# Patient Record
Sex: Female | Born: 1955
Health system: Southern US, Community
[De-identification: ages and names within clinical notes are randomized; demographics above are authoritative.]

## PROBLEM LIST (undated history)

## (undated) DIAGNOSIS — M329 Systemic lupus erythematosus, unspecified: Secondary | ICD-10-CM

## (undated) DIAGNOSIS — Z973 Presence of spectacles and contact lenses: Secondary | ICD-10-CM

## (undated) DIAGNOSIS — K259 Gastric ulcer, unspecified as acute or chronic, without hemorrhage or perforation: Secondary | ICD-10-CM

## (undated) DIAGNOSIS — L932 Other local lupus erythematosus: Secondary | ICD-10-CM

## (undated) DIAGNOSIS — K635 Polyp of colon: Secondary | ICD-10-CM

## (undated) DIAGNOSIS — D61810 Antineoplastic chemotherapy induced pancytopenia: Secondary | ICD-10-CM

## (undated) DIAGNOSIS — K649 Unspecified hemorrhoids: Secondary | ICD-10-CM

## (undated) DIAGNOSIS — Z923 Personal history of irradiation: Secondary | ICD-10-CM

## (undated) DIAGNOSIS — M1990 Unspecified osteoarthritis, unspecified site: Secondary | ICD-10-CM

## (undated) DIAGNOSIS — R110 Nausea: Secondary | ICD-10-CM

## (undated) DIAGNOSIS — Z8049 Family history of malignant neoplasm of other genital organs: Secondary | ICD-10-CM

## (undated) DIAGNOSIS — E785 Hyperlipidemia, unspecified: Secondary | ICD-10-CM

## (undated) DIAGNOSIS — D649 Anemia, unspecified: Secondary | ICD-10-CM

## (undated) DIAGNOSIS — Z8711 Personal history of peptic ulcer disease: Secondary | ICD-10-CM

## (undated) DIAGNOSIS — C539 Malignant neoplasm of cervix uteri, unspecified: Secondary | ICD-10-CM

## (undated) DIAGNOSIS — Z803 Family history of malignant neoplasm of breast: Secondary | ICD-10-CM

## (undated) DIAGNOSIS — T451X5A Adverse effect of antineoplastic and immunosuppressive drugs, initial encounter: Secondary | ICD-10-CM

## (undated) DIAGNOSIS — K219 Gastro-esophageal reflux disease without esophagitis: Secondary | ICD-10-CM

## (undated) DIAGNOSIS — F419 Anxiety disorder, unspecified: Secondary | ICD-10-CM

## (undated) HISTORY — DX: Personal history of irradiation: Z92.3

## (undated) HISTORY — DX: Family history of malignant neoplasm of breast: Z80.3

## (undated) HISTORY — PX: ENDOMETRIAL ABLATION: SHX621

## (undated) HISTORY — PX: OTHER SURGICAL HISTORY: SHX169

## (undated) HISTORY — PX: COLONOSCOPY: SHX174

## (undated) HISTORY — PX: COLONOSCOPY WITH ESOPHAGOGASTRODUODENOSCOPY (EGD): SHX5779

## (undated) HISTORY — PX: ESOPHAGOGASTRODUODENOSCOPY: SHX1529

## (undated) HISTORY — DX: Family history of malignant neoplasm of other genital organs: Z80.49

---
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
## 2000-01-10 ENCOUNTER — Encounter: Payer: Self-pay | Admitting: Family Medicine

## 2000-01-10 ENCOUNTER — Encounter: Admission: RE | Admit: 2000-01-10 | Discharge: 2000-01-10 | Payer: Self-pay | Admitting: Family Medicine

## 2008-01-06 ENCOUNTER — Encounter: Admission: RE | Admit: 2008-01-06 | Discharge: 2008-01-06 | Payer: Self-pay | Admitting: Obstetrics

## 2008-06-19 HISTORY — PX: ENDOMETRIAL ABLATION: SHX621

## 2008-06-19 LAB — HM COLONOSCOPY

## 2010-03-23 ENCOUNTER — Encounter: Admission: RE | Admit: 2010-03-23 | Discharge: 2010-03-23 | Payer: Self-pay | Admitting: Obstetrics

## 2011-02-15 ENCOUNTER — Other Ambulatory Visit: Payer: Self-pay | Admitting: Obstetrics

## 2011-02-15 DIAGNOSIS — Z1231 Encounter for screening mammogram for malignant neoplasm of breast: Secondary | ICD-10-CM

## 2011-03-24 ENCOUNTER — Ambulatory Visit
Admission: RE | Admit: 2011-03-24 | Discharge: 2011-03-24 | Disposition: A | Discharge status: Home / Self Care | Payer: Commercial Indemnity | Source: Ambulatory Visit | Attending: Obstetrics | Admitting: Obstetrics

## 2011-03-24 DIAGNOSIS — Z1231 Encounter for screening mammogram for malignant neoplasm of breast: Secondary | ICD-10-CM

## 2012-04-15 ENCOUNTER — Other Ambulatory Visit: Payer: Self-pay | Admitting: Obstetrics

## 2012-04-15 DIAGNOSIS — Z1231 Encounter for screening mammogram for malignant neoplasm of breast: Secondary | ICD-10-CM

## 2012-04-25 ENCOUNTER — Ambulatory Visit
Admission: RE | Admit: 2012-04-25 | Discharge: 2012-04-25 | Disposition: A | Discharge status: Home / Self Care | Payer: BC Managed Care – PPO | Source: Ambulatory Visit | Attending: Obstetrics | Admitting: Obstetrics

## 2012-04-25 DIAGNOSIS — Z1231 Encounter for screening mammogram for malignant neoplasm of breast: Secondary | ICD-10-CM

## 2012-06-19 LAB — HM PAP SMEAR

## 2013-01-31 ENCOUNTER — Other Ambulatory Visit: Payer: Self-pay

## 2013-01-31 DIAGNOSIS — Z1231 Encounter for screening mammogram for malignant neoplasm of breast: Secondary | ICD-10-CM

## 2013-04-28 ENCOUNTER — Ambulatory Visit
Admission: RE | Admit: 2013-04-28 | Discharge: 2013-04-28 | Disposition: A | Discharge status: Home / Self Care | Payer: BC Managed Care – PPO | Source: Ambulatory Visit

## 2013-04-28 DIAGNOSIS — Z1231 Encounter for screening mammogram for malignant neoplasm of breast: Secondary | ICD-10-CM

## 2013-11-06 ENCOUNTER — Ambulatory Visit: Payer: BC Managed Care – PPO | Admitting: Family Medicine

## 2014-06-26 ENCOUNTER — Other Ambulatory Visit: Payer: Self-pay

## 2014-06-26 DIAGNOSIS — Z1231 Encounter for screening mammogram for malignant neoplasm of breast: Secondary | ICD-10-CM

## 2014-07-01 ENCOUNTER — Ambulatory Visit
Admission: RE | Admit: 2014-07-01 | Discharge: 2014-07-01 | Disposition: A | Discharge status: Home / Self Care | Payer: BLUE CROSS/BLUE SHIELD | Source: Ambulatory Visit

## 2014-07-01 DIAGNOSIS — Z1231 Encounter for screening mammogram for malignant neoplasm of breast: Secondary | ICD-10-CM

## 2015-12-15 ENCOUNTER — Other Ambulatory Visit: Payer: Self-pay | Admitting: Obstetrics

## 2015-12-15 DIAGNOSIS — Z1231 Encounter for screening mammogram for malignant neoplasm of breast: Secondary | ICD-10-CM

## 2015-12-23 ENCOUNTER — Ambulatory Visit
Admission: RE | Admit: 2015-12-23 | Discharge: 2015-12-23 | Disposition: A | Discharge status: Home / Self Care | Payer: 59 | Source: Ambulatory Visit | Attending: Obstetrics | Admitting: Obstetrics

## 2015-12-23 DIAGNOSIS — Z1231 Encounter for screening mammogram for malignant neoplasm of breast: Secondary | ICD-10-CM

## 2016-06-21 ENCOUNTER — Ambulatory Visit (INDEPENDENT_AMBULATORY_CARE_PROVIDER_SITE_OTHER): Payer: 59 | Admitting: Family Medicine

## 2016-06-21 ENCOUNTER — Encounter: Payer: Self-pay | Admitting: Family Medicine

## 2016-06-21 VITALS — BP 120/72 | HR 81 | Temp 98.1°F | Resp 17 | Ht 65.0 in | Wt 212.5 lb

## 2016-06-21 DIAGNOSIS — Z1211 Encounter for screening for malignant neoplasm of colon: Secondary | ICD-10-CM

## 2016-06-21 DIAGNOSIS — Z0000 Encounter for general adult medical examination without abnormal findings: Secondary | ICD-10-CM

## 2016-06-21 DIAGNOSIS — Z23 Encounter for immunization: Secondary | ICD-10-CM

## 2016-06-21 NOTE — Progress Notes (Signed)
Pre visit review using our clinic review tool, if applicable. No additional management support is needed unless otherwise documented below in the visit note. 

## 2016-06-21 NOTE — Patient Instructions (Signed)
Follow up in 1 year or as needed We'll notify you of your lab results and make any changes if needed Try and make healthy food choices and get some exercise- you can do it!!! Make sure you are taking at least 20 minutes a day for you!!! Call with any questions or concerns Welcome!  We're glad to have you!!! Happy New Year!!!

## 2016-06-21 NOTE — Progress Notes (Signed)
   Subjective:    Patient ID: Phyllis Hebert, female    DOB: [birth year], 61 y.o.   MRN: [id number]  HPI New to establish.  Previous MD- [facility])  GYN- Pamala Hurry (UTD on pap, [place])  Due for Tdap.  Overdue for colonoscopy- Dr Collene Mares  CPE- pt has no concerns today w/ exception of 'overweight, not exercising, working way too much'.   Review of Systems Patient reports no vision/ hearing changes, adenopathy,fever, weight change,  persistant/recurrent hoarseness , swallowing issues, chest pain, palpitations, edema, persistant/recurrent cough, hemoptysis, dyspnea (rest/exertional/paroxysmal nocturnal), gastrointestinal bleeding (melena, rectal bleeding), abdominal pain, significant heartburn, bowel changes, GU symptoms (dysuria, hematuria, incontinence), Gyn symptoms (abnormal  bleeding, pain),  syncope, focal weakness, memory loss, numbness & tingling, skin/hair/nail changes, abnormal bruising or bleeding, anxiety, or depression.     Objective:   Physical Exam General Appearance:    Alert, cooperative, no distress, appears stated age  Head:    Normocephalic, without obvious abnormality, atraumatic  Eyes:    PERRL, conjunctiva/corneas clear, EOM's intact, fundi    benign, both eyes  Ears:    Normal TM's and external ear canals, both ears  Nose:   Nares normal, septum midline, mucosa normal, no drainage    or sinus tenderness  Throat:   Lips, mucosa, and tongue normal; teeth and gums normal  Neck:   Supple, symmetrical, trachea midline, no adenopathy;    Thyroid: no enlargement/tenderness/nodules  Back:     Symmetric, no curvature, ROM normal, no CVA tenderness  Lungs:     Clear to auscultation bilaterally, respirations unlabored  Chest Wall:    No tenderness or deformity   Heart:    Regular rate and rhythm, S1 and S2 normal, no murmur, rub   or gallop  Breast Exam:    Deferred to GYN  Abdomen:     Soft, non-tender, bowel sounds active all four quadrants,    no masses, no  organomegaly  Genitalia:    Deferred to GYN  Rectal:    Extremities:   Extremities normal, atraumatic, no cyanosis or edema  Pulses:   2+ and symmetric all extremities  Skin:   Skin color, texture, turgor normal, no rashes or lesions  Lymph nodes:   Cervical, supraclavicular, and axillary nodes normal  Neurologic:   CNII-XII intact, normal strength, sensation and reflexes    throughout          Assessment & Plan:  Physical exam- pt's PE WNL w/ exception of obesity.  UTD on GYN, mammo.  Overdue for colonoscopy- referral placed.  Check labs.  Anticipatory guidance provided.

## 2016-06-21 NOTE — Addendum Note (Signed)
Addended by: Davis Gourd on: 06/21/2016 03:33 PM   Modules accepted: Orders

## 2016-06-22 ENCOUNTER — Other Ambulatory Visit: Payer: Self-pay | Admitting: General Practice

## 2016-06-22 LAB — BASIC METABOLIC PANEL
BUN/Creatinine Ratio: 23 (ref 9–23)
BUN: 16 mg/dL (ref 6–24)
CO2: 24 mmol/L (ref 18–29)
Calcium: 9.6 mg/dL (ref 8.7–10.2)
Chloride: 101 mmol/L (ref 96–106)
Creatinine, Ser: 0.69 mg/dL (ref 0.57–1.00)
GFR calc Af Amer: 111 mL/min/{1.73_m2} (ref >59)
GFR calc non Af Amer: 96 mL/min/{1.73_m2} (ref >59)
Glucose: 102 mg/dL — ABNORMAL HIGH (ref 65–99)
Potassium: 4.2 mmol/L (ref 3.5–5.2)
Sodium: 140 mmol/L (ref 134–144)

## 2016-06-22 LAB — CBC WITH DIFFERENTIAL/PLATELET
Basophils Absolute: 0 10*3/uL (ref 0.0–0.2)
Basos: 0 %
EOS (ABSOLUTE): 0.1 10*3/uL (ref 0.0–0.4)
Eos: 1 %
Hematocrit: 40.4 % (ref 34.0–46.6)
Hemoglobin: 13.2 g/dL (ref 11.1–15.9)
Immature Grans (Abs): 0 10*3/uL (ref 0.0–0.1)
Immature Granulocytes: 0 %
Lymphocytes Absolute: 2.6 10*3/uL (ref 0.7–3.1)
Lymphs: 30 %
MCH: 28 pg (ref 26.6–33.0)
MCHC: 32.7 g/dL (ref 31.5–35.7)
MCV: 86 fL (ref 79–97)
Monocytes Absolute: 0.6 10*3/uL (ref 0.1–0.9)
Monocytes: 7 %
Neutrophils Absolute: 5.2 10*3/uL (ref 1.4–7.0)
Neutrophils: 62 %
Platelets: 273 10*3/uL (ref 150–379)
RBC: 4.71 x10E6/uL (ref 3.77–5.28)
RDW: 15.9 % — ABNORMAL HIGH (ref 12.3–15.4)
WBC: 8.5 10*3/uL (ref 3.4–10.8)

## 2016-06-22 LAB — HEPATIC FUNCTION PANEL
ALT: 16 IU/L (ref 0–32)
AST: 17 IU/L (ref 0–40)
Albumin: 4.3 g/dL (ref 3.5–5.5)
Alkaline Phosphatase: 71 IU/L (ref 39–117)
Bilirubin Total: 0.3 mg/dL (ref 0.0–1.2)
Bilirubin, Direct: 0.1 mg/dL (ref 0.00–0.40)
Total Protein: 7.3 g/dL (ref 6.0–8.5)

## 2016-06-22 LAB — LIPID PANEL
Chol/HDL Ratio: 5.6 ratio units — ABNORMAL HIGH (ref 0.0–4.4)
Cholesterol, Total: 230 mg/dL — ABNORMAL HIGH (ref 100–199)
HDL: 41 mg/dL (ref >39)
LDL Calculated: 132 mg/dL — ABNORMAL HIGH (ref 0–99)
Triglycerides: 285 mg/dL — ABNORMAL HIGH (ref 0–149)
VLDL Cholesterol Cal: 57 mg/dL — ABNORMAL HIGH (ref 5–40)

## 2016-06-22 LAB — VITAMIN D 25 HYDROXY (VIT D DEFICIENCY, FRACTURES): Vit D, 25-Hydroxy: 12.6 ng/mL — ABNORMAL LOW (ref 30.0–100.0)

## 2016-06-22 LAB — TSH: TSH: 1.16 u[IU]/mL (ref 0.450–4.500)

## 2016-06-22 MED ORDER — FENOFIBRATE 160 MG PO TABS: 160.0000 mg | ORAL_TABLET | Freq: Every day | ORAL | 6 refills | Status: DC

## 2016-06-22 MED ORDER — VITAMIN D (ERGOCALCIFEROL) 1.25 MG (50000 UNIT) PO CAPS: 50000.0000 [IU] | ORAL_CAPSULE | ORAL | 0 refills | Status: DC

## 2016-10-03 ENCOUNTER — Encounter: Payer: Self-pay | Admitting: Physician Assistant

## 2016-10-03 ENCOUNTER — Ambulatory Visit (INDEPENDENT_AMBULATORY_CARE_PROVIDER_SITE_OTHER): Payer: 59 | Admitting: Physician Assistant

## 2016-10-03 VITALS — BP 124/82 | HR 71 | Temp 97.8°F | Resp 14 | Ht 65.0 in | Wt 199.0 lb

## 2016-10-03 DIAGNOSIS — R21 Rash and other nonspecific skin eruption: Secondary | ICD-10-CM | POA: Diagnosis not present

## 2016-10-03 DIAGNOSIS — R5383 Other fatigue: Secondary | ICD-10-CM

## 2016-10-03 LAB — SEDIMENTATION RATE: Sed Rate: 11 mm/hr (ref 0–30)

## 2016-10-03 MED ORDER — METHYLPREDNISOLONE 4 MG PO TBPK: ORAL_TABLET | ORAL | 0 refills | Status: DC

## 2016-10-03 NOTE — Patient Instructions (Signed)
Please go to the lab for blood work. I will call you with your results.  Please avoid excess sunlight exposure.  Start the steroid pack as directed. Keep skin clean and dry. Let me know if symptoms are not resolving with medication. You can use a Benadryl at night to help with any itch. OTC Sarna lotion to help with itch.

## 2016-10-03 NOTE — Progress Notes (Signed)
   Patient presents to clinic today c/o 2 weeks of a rash of upper and lower extremities, now also noted on anterior upper chest. Is sometimes pruritic. No painful. Denies alleviating or aggravating factors. Denies fever, chills or recent URI symptoms. Denies recent travel or sick contact. Denies change to diet or hygiene products. Denies new pet.  History reviewed. No pertinent past medical history.  Current Outpatient Prescriptions on File Prior to Visit  Medication Sig Dispense Refill  . fenofibrate 160 MG tablet Take 1 tablet (160 mg total) by mouth daily. 30 tablet 6   No current facility-administered medications on file prior to visit.     Allergies  Allergen Reactions  . Penicillins Rash    Family History  Problem Relation Age of Onset  . Hypertension Mother   . Cancer Mother     uterine  . Hypertension Father   . Stroke Father   . Hypertension Brother   . Healthy Daughter   . Stroke Paternal Uncle     Social History   Social History  . Marital status: Married    Spouse name: N/A  . Number of children: N/A  . Years of education: N/A   Social History Main Topics  . Smoking status: Never Smoker  . Smokeless tobacco: Never Used  . Alcohol use No  . Drug use: No  . Sexual activity: Not Asked   Other Topics Concern  . None   Social History Narrative  . None   Review of Systems - See HPI.  All other ROS are negative.  BP 124/82   Pulse 71   Temp 97.8 F (36.6 C) (Oral)   Resp 14   Ht '5\' 5"'$  (1.651 m)   Wt 199 lb (90.3 kg)   SpO2 98%   BMI 33.12 kg/m   Physical Exam  Constitutional: She is oriented to person, place, and time and well-developed, well-nourished, and in no distress.  HENT:  Head: Normocephalic and atraumatic.  Eyes: Conjunctivae are normal.  Neck: Neck supple.  Cardiovascular: Normal rate, regular rhythm, normal heart sounds and intact distal pulses.   Pulmonary/Chest: Effort normal and breath sounds normal. No respiratory distress.  She has no wheezes. She has no rales. She exhibits no tenderness.  Lymphadenopathy:    She has no cervical adenopathy.  Neurological: She is alert and oriented to person, place, and time.  Skin: Skin is warm and dry. Rash noted. Rash is macular.  Macular rash noted of extensor surfaces of distal upper and lower extremities. No evidence of excoriation or abrasion of skin. No evidence of rash of palms or soles.  There is faint similar rash of anterior neck and upper chest, in sun-exposed areas.  Psychiatric: Affect normal.  Vitals reviewed.  Assessment/Plan: 1. Other fatigue Recent Vitamin D deficiency. Will check Vitamin D level today and ESR giving new onset rash. - Vitamin D 1,25 dihydroxy - Sed Rate (ESR)  2. Rash and nonspecific skin eruption Potential photodermatitis as rash is in sun-exposed areas only. No other noted potential triggers. Will start Medrol dose pack. Supportive measures and OTC medications reviewed. ESR ordered. Follow-up if not resolving or if new symptoms develop.  - methylPREDNISolone (MEDROL DOSEPAK) 4 MG TBPK tablet; Take following package directions.  Dispense: 21 tablet; Refill: 0   Leeanne Rio, [place]

## 2016-10-03 NOTE — Progress Notes (Signed)
Pre visit review using our clinic review tool, if applicable. No additional management support is needed unless otherwise documented below in the visit note. 

## 2016-10-06 LAB — VITAMIN D 1,25 DIHYDROXY
Vitamin D 1, 25 (OH)2 Total: 41 pg/mL (ref 18–72)
Vitamin D2 1, 25 (OH)2: 12 pg/mL
Vitamin D3 1, 25 (OH)2: 29 pg/mL

## 2016-10-18 ENCOUNTER — Telehealth: Payer: Self-pay | Admitting: Family Medicine

## 2016-10-18 MED ORDER — PREDNISONE 10 MG PO TABS: ORAL_TABLET | ORAL | 0 refills | Status: DC

## 2016-10-18 NOTE — Telephone Encounter (Signed)
Patient states she is agreeable with starting a longer taper of the steroids. She states she could feel a relief when first started.

## 2016-10-18 NOTE — Telephone Encounter (Signed)
LMOVM for patient to call back of any changes to the rash, better or worst

## 2016-10-18 NOTE — Telephone Encounter (Signed)
Patient states she was seen a few weeks ago for rash.  She was prescribed medication by [facility] and advised the medication should clear the rash up.    The medication did clear up the rash.  However, once she stopped taking the medication, the rash as returned.  She states she was told to call back and let Phyllis Hebert no if the rash had not improved.  Please return call to patient at [phone]), if she is away from her desk please call her on her mobile phone, [phone].

## 2016-10-18 NOTE — Telephone Encounter (Signed)
Call patient to assess current symptoms.  Any new characteristics of rash? Would recommend we set her up urgently with Dermatology for assessment.  Let me know current symptoms so we can determine treatment in the mean time.

## 2016-10-18 NOTE — Telephone Encounter (Signed)
Patient states the rash did not fully resolve 100% on her hand and feet. Now the rash is back at the same places and around her lips and in right ear. The rash is itchy and redness She finished the Medrol dose pak The rash is between the wrist to elbow, fine bumps under the skin. Please advise

## 2016-10-18 NOTE — Telephone Encounter (Signed)
Can restart a longer taper or have her come in for steroid injection.  Let me know what she decides.

## 2016-10-18 NOTE — Telephone Encounter (Signed)
Taper sent

## 2016-11-09 ENCOUNTER — Ambulatory Visit (INDEPENDENT_AMBULATORY_CARE_PROVIDER_SITE_OTHER): Payer: 59 | Admitting: Family Medicine

## 2016-11-09 ENCOUNTER — Encounter: Payer: Self-pay | Admitting: Family Medicine

## 2016-11-09 VITALS — BP 129/71 | HR 73 | Temp 98.1°F | Resp 16 | Ht 65.0 in | Wt 199.0 lb

## 2016-11-09 DIAGNOSIS — R5383 Other fatigue: Secondary | ICD-10-CM | POA: Diagnosis not present

## 2016-11-09 DIAGNOSIS — R21 Rash and other nonspecific skin eruption: Secondary | ICD-10-CM

## 2016-11-09 LAB — CBC WITH DIFFERENTIAL/PLATELET
Basophils Absolute: 0 10*3/uL (ref 0.0–0.1)
Basophils Relative: 0.8 % (ref 0.0–3.0)
Eosinophils Absolute: 0 10*3/uL (ref 0.0–0.7)
Eosinophils Relative: 0.6 % (ref 0.0–5.0)
HCT: 40.2 % (ref 36.0–46.0)
Hemoglobin: 13.3 g/dL (ref 12.0–15.0)
Lymphocytes Relative: 37.4 % (ref 12.0–46.0)
Lymphs Abs: 2.2 10*3/uL (ref 0.7–4.0)
MCHC: 33.1 g/dL (ref 30.0–36.0)
MCV: 86.7 fl (ref 78.0–100.0)
Monocytes Absolute: 0.5 10*3/uL (ref 0.1–1.0)
Monocytes Relative: 9.1 % (ref 3.0–12.0)
Neutro Abs: 3.1 10*3/uL (ref 1.4–7.7)
Neutrophils Relative %: 52.1 % (ref 43.0–77.0)
Platelets: 322 10*3/uL (ref 150.0–400.0)
RBC: 4.64 Mil/uL (ref 3.87–5.11)
RDW: 15.8 % — ABNORMAL HIGH (ref 11.5–15.5)
WBC: 5.9 10*3/uL (ref 4.0–10.5)

## 2016-11-09 LAB — BASIC METABOLIC PANEL
BUN: 19 mg/dL (ref 6–23)
CO2: 30 mEq/L (ref 19–32)
Calcium: 10.1 mg/dL (ref 8.4–10.5)
Chloride: 105 mEq/L (ref 96–112)
Creatinine, Ser: 0.87 mg/dL (ref 0.40–1.20)
GFR: 70.76 mL/min (ref >60.00)
Glucose, Bld: 107 mg/dL — ABNORMAL HIGH (ref 70–99)
Potassium: 4 mEq/L (ref 3.5–5.1)
Sodium: 141 mEq/L (ref 135–145)

## 2016-11-09 LAB — TSH: TSH: 1.31 u[IU]/mL (ref 0.35–4.50)

## 2016-11-09 MED ORDER — TRIAMCINOLONE ACETONIDE 0.1 % EX OINT: 1.0000 "application " | TOPICAL_OINTMENT | Freq: Two times a day (BID) | CUTANEOUS | 1 refills | Status: DC

## 2016-11-09 NOTE — Progress Notes (Signed)
Pre visit review using our clinic review tool, if applicable. No additional management support is needed unless otherwise documented below in the visit note. 

## 2016-11-09 NOTE — Patient Instructions (Signed)
Follow up as needed/scheduled We'll notify you of your lab results and make any changes if needed Start the Triamcinolone ointment twice daily on the rash We'll call you with your Derm appt for evaluation Call with any questions or concerns Hang in there!  We'll figure this out!

## 2016-11-09 NOTE — Progress Notes (Signed)
   Subjective:    Patient ID: Phyllis Hebert, female    DOB: 1956/05/01, 61 y.o.   MRN: [id number]  HPI Fatigue- pt had similar sxs in January.  Was found to be Vit D deficient at that time.  Pt has not had improvement in fatigue despite high dose Vit D replacement.  Pt reports that while she was taking Prednisone, she felt much better in regards to energy level.  When prednisone is finished, she again has excessive fatigue.  Pt reports sleeping well at night.  No loud snoring or breathing pauses to suggest OSA.  Pt reports she will develop a rash on dorsum of hands bilaterally that will extend up her arms.  Will also have sxs on tops of feet, center of chest, and on either side of neck.  Pt reports she has had peeling and cracking of lips since she started having this rash.   Review of Systems For ROS see HPI     Objective:   Physical Exam  Constitutional: She is oriented to person, place, and time. She appears well-developed and well-nourished. No distress.  HENT:  Head: Normocephalic and atraumatic.  Neck: Normal range of motion. Neck supple. No thyromegaly present.  Cardiovascular: Normal rate, regular rhythm and normal heart sounds.   Pulmonary/Chest: Effort normal and breath sounds normal. No respiratory distress. She has no wheezes. She has no rales.  Lymphadenopathy:    She has no cervical adenopathy.  Neurological: She is alert and oriented to person, place, and time.  Skin: Skin is warm and dry. Rash (sandpaper maculopapular rash on dorsum of arms bilaterally, patchy areas on both sides of neck and patchy area on chest) noted. There is erythema (mild over sandpaper rash areas on dorsum of arms and patchy area on chest).  Vitals reviewed.         Assessment & Plan:  Fatigue/Rash- new.  Given pt's description of rash and fatigue, there is a concern for autoimmune process.  Also suspicious is that her sxs dramatically improve w/ Prednisone.  Check ANA.  Refer to derm.   Triamcinolone topically to rash.  If ANA is +, will refer to Rheum.  Pt expressed understanding and is in agreement w/ plan.

## 2016-11-10 ENCOUNTER — Other Ambulatory Visit: Payer: Self-pay | Admitting: Family Medicine

## 2016-11-10 DIAGNOSIS — R768 Other specified abnormal immunological findings in serum: Secondary | ICD-10-CM

## 2016-11-10 LAB — ANTI-NUCLEAR AB-TITER (ANA TITER): ANA Titer 1: 1:160 {titer} — ABNORMAL HIGH

## 2016-11-10 LAB — ANA: Anti Nuclear Antibody(ANA): POSITIVE — AB

## 2017-01-16 ENCOUNTER — Other Ambulatory Visit: Payer: Self-pay | Admitting: Family Medicine

## 2017-01-16 DIAGNOSIS — Z1231 Encounter for screening mammogram for malignant neoplasm of breast: Secondary | ICD-10-CM

## 2017-01-25 ENCOUNTER — Other Ambulatory Visit: Payer: Self-pay | Admitting: Family Medicine

## 2017-01-26 ENCOUNTER — Ambulatory Visit
Admission: RE | Admit: 2017-01-26 | Discharge: 2017-01-26 | Disposition: A | Discharge status: Home / Self Care | Payer: 59 | Source: Ambulatory Visit | Attending: Family Medicine | Admitting: Family Medicine

## 2017-01-26 DIAGNOSIS — Z1231 Encounter for screening mammogram for malignant neoplasm of breast: Secondary | ICD-10-CM

## 2017-06-01 ENCOUNTER — Encounter: Payer: Self-pay | Admitting: Physician Assistant

## 2017-06-01 ENCOUNTER — Other Ambulatory Visit: Payer: Self-pay

## 2017-06-01 ENCOUNTER — Ambulatory Visit (INDEPENDENT_AMBULATORY_CARE_PROVIDER_SITE_OTHER): Payer: BLUE CROSS/BLUE SHIELD | Admitting: Physician Assistant

## 2017-06-01 VITALS — BP 120/80 | HR 72 | Temp 97.7°F | Resp 14 | Ht 65.0 in | Wt 208.0 lb

## 2017-06-01 DIAGNOSIS — J208 Acute bronchitis due to other specified organisms: Secondary | ICD-10-CM | POA: Diagnosis not present

## 2017-06-01 DIAGNOSIS — B9689 Other specified bacterial agents as the cause of diseases classified elsewhere: Secondary | ICD-10-CM

## 2017-06-01 MED ORDER — BENZONATATE 100 MG PO CAPS: 100.0000 mg | ORAL_CAPSULE | Freq: Three times a day (TID) | ORAL | 0 refills | Status: DC | PRN

## 2017-06-01 MED ORDER — AZITHROMYCIN 250 MG PO TABS: ORAL_TABLET | ORAL | 0 refills | Status: DC

## 2017-06-01 NOTE — Progress Notes (Signed)
Patient presents to clinic today c/o 4 weeks of chest congestion and cough that is productive of a green sputum. Has notes nasal congestion but no sinus pain. Notes some R ear pressure. Endorses symptoms seem to wax and wane over the past 3 weeks but have been consistent and worsening over the past week. Denies fever or chills. Has taken Alka Seltzer with temporary relief. Denies recent travel or sick contact.   History reviewed. No pertinent past medical history.  Current Outpatient Medications on File Prior to Visit  Medication Sig Dispense Refill  . Cholecalciferol (VITAMIN D3) 2000 units TABS Take 2 tablets by mouth daily.    . fenofibrate 160 MG tablet TAKE 1 TABLET BY MOUTH DAILY 30 tablet 5  . hydroxychloroquine (PLAQUENIL) 200 MG tablet 2 TABLET WITH FOOD OR MILK ONCE A DAY ORALLY 30  2  . meloxicam (MOBIC) 15 MG tablet Take 15 mg by mouth daily.  3   No current facility-administered medications on file prior to visit.     Allergies  Allergen Reactions  . Penicillins Rash    Family History  Problem Relation Age of Onset  . Hypertension Mother   . Cancer Mother        uterine  . Hypertension Father   . Stroke Father   . Hypertension Brother   . Healthy Daughter   . Stroke Paternal Uncle     Social History   Socioeconomic History  . Marital status: Married    Spouse name: None  . Number of children: None  . Years of education: None  . Highest education level: None  Social Needs  . Financial resource strain: None  . Food insecurity - worry: None  . Food insecurity - inability: None  . Transportation needs - medical: None  . Transportation needs - non-medical: None  Occupational History  . None  Tobacco Use  . Smoking status: Never Smoker  . Smokeless tobacco: Never Used  Substance and Sexual Activity  . Alcohol use: No  . Drug use: No  . Sexual activity: None  Other Topics Concern  . None  Social History Narrative  . None   Review of Systems - See  HPI.  All other ROS are negative.  BP 120/80   Pulse 72   Temp 97.7 F (36.5 C) (Oral)   Resp 14   Ht 5\' 5"  (1.651 m)   Wt 208 lb (94.3 kg)   SpO2 98%   BMI 34.61 kg/m   Physical Exam  Constitutional: She is oriented to person, place, and time and well-developed, well-nourished, and in no distress.  HENT:  Head: Normocephalic and atraumatic.  Right Ear: External ear normal.  Left Ear: External ear normal.  Nose: Nose normal.  Mouth/Throat: Oropharynx is clear and moist. No oropharyngeal exudate.  TM within normal limits bilaterally.  Eyes: Conjunctivae are normal.  Neck: Neck supple.  Cardiovascular: Normal rate, regular rhythm, normal heart sounds and intact distal pulses.  Pulmonary/Chest: Effort normal and breath sounds normal. No respiratory distress. She has no wheezes. She has no rales. She exhibits no tenderness.  Neurological: She is alert and oriented to person, place, and time.  Skin: Skin is warm and dry. No rash noted.  Psychiatric: Affect normal.  Vitals reviewed.  Assessment/Plan: 1. Acute bacterial bronchitis Start ABX. Tessalon per orders. OTC medications and supportive measures reviewed. Follow-up if not improving.   - azithromycin (ZITHROMAX) 250 MG tablet; Take 2 tablets on Day 1. Then take 1 tablet daily.  Dispense: 6 tablet; Refill: 0 - benzonatate (TESSALON) 100 MG capsule; Take 1 capsule (100 mg total) by mouth 3 (three) times daily as needed for cough.  Dispense: 30 capsule; Refill: 0   Leeanne Rio, PA-C

## 2017-06-01 NOTE — Patient Instructions (Signed)
Take antibiotic (Azithromycin) as directed.  Increase fluids.  Get plenty of rest. Use Mucinex for congestion. Start saline rinses. Tsssalon per orders.. Take a daily probiotic (I recommend Align or Culturelle, but even Activia Yogurt may be beneficial).  A humidifier placed in the bedroom may offer some relief for a dry, scratchy throat of nasal irritation.  Read information below on acute bronchitis. Please call or return to clinic if symptoms are not improving.  Acute Bronchitis Bronchitis is when the airways that extend from the windpipe into the lungs get red, puffy, and painful (inflamed). Bronchitis often causes thick spit (mucus) to develop. This leads to a cough. A cough is the most common symptom of bronchitis. In acute bronchitis, the condition usually begins suddenly and goes away over time (usually in 2 weeks). Smoking, allergies, and asthma can make bronchitis worse. Repeated episodes of bronchitis may cause more lung problems.  HOME CARE  Rest.  Drink enough fluids to keep your pee (urine) clear or pale yellow (unless you need to limit fluids as told by your doctor).  Only take over-the-counter or prescription medicines as told by your doctor.  Avoid smoking and secondhand smoke. These can make bronchitis worse. If you are a smoker, think about using nicotine gum or skin patches. Quitting smoking will help your lungs heal faster.  Reduce the chance of getting bronchitis again by:  Washing your hands often.  Avoiding people with cold symptoms.  Trying not to touch your hands to your mouth, nose, or eyes.  Follow up with your doctor as told.  GET HELP IF: Your symptoms do not improve after 1 week of treatment. Symptoms include:  Cough.  Fever.  Coughing up thick spit.  Body aches.  Chest congestion.  Chills.  Shortness of breath.  Sore throat.  GET HELP RIGHT AWAY IF:   You have an increased fever.  You have chills.  You have severe shortness of  breath.  You have bloody thick spit (sputum).  You throw up (vomit) often.  You lose too much body fluid (dehydration).  You have a severe headache.  You faint.  MAKE SURE YOU:   Understand these instructions.  Will watch your condition.  Will get help right away if you are not doing well or get worse. Document Released: 11/22/2007 Document Revised: 02/05/2013 Document Reviewed: 11/26/2012 [facility] Patient Information 2015 [place], [place]. This information is not intended to replace advice given to you by your health care provider. Make sure you discuss any questions you have with your health care provider.

## 2017-07-02 ENCOUNTER — Other Ambulatory Visit: Payer: Self-pay

## 2017-07-02 ENCOUNTER — Encounter: Payer: Self-pay | Admitting: Family Medicine

## 2017-07-02 ENCOUNTER — Ambulatory Visit (INDEPENDENT_AMBULATORY_CARE_PROVIDER_SITE_OTHER): Payer: BLUE CROSS/BLUE SHIELD | Admitting: Family Medicine

## 2017-07-02 VITALS — BP 122/80 | HR 78 | Temp 98.7°F | Resp 16 | Ht 65.0 in | Wt 209.0 lb

## 2017-07-02 DIAGNOSIS — L932 Other local lupus erythematosus: Secondary | ICD-10-CM | POA: Insufficient documentation

## 2017-07-02 DIAGNOSIS — E781 Pure hyperglyceridemia: Secondary | ICD-10-CM | POA: Diagnosis not present

## 2017-07-02 DIAGNOSIS — E559 Vitamin D deficiency, unspecified: Secondary | ICD-10-CM

## 2017-07-02 DIAGNOSIS — Z0000 Encounter for general adult medical examination without abnormal findings: Secondary | ICD-10-CM | POA: Diagnosis not present

## 2017-07-02 NOTE — Assessment & Plan Note (Signed)
Ongoing issue for pt.  Taking Fenofibrate daily.  Stressed need for healthy diet and regular exercise.  Check labs.  Adjust meds prn

## 2017-07-02 NOTE — Assessment & Plan Note (Signed)
Pt's PE WNL w/ exception of obesity.  UTD on mammo, colonoscopy.  Due for pap- pt to schedule.  Check labs.  Anticipatory guidance provided.

## 2017-07-02 NOTE — Patient Instructions (Signed)
Schedule your pap at your convenience We'll notify you of your lab results and make any changes if needed Continue to work on healthy diet and regular exercise- you look great!!! Call with any questions or concerns Happy New Year and Happy Retirement!!!

## 2017-07-02 NOTE — Assessment & Plan Note (Signed)
Following w/ [place] [facility] Marella Chimes).  Will follow along.

## 2017-07-02 NOTE — Progress Notes (Signed)
   Subjective:    Patient ID: Phyllis Hebert, female    DOB: 06-22-1955, 62 y.o.   MRN: [id number]  HPI CPE- UTD on mammo, colonoscopy, Tdap.  Overdue for pap but pt is not interested today- willing to come back.  Declines flu   Review of Systems Patient reports no vision/ hearing changes, adenopathy,fever, weight change,  persistant/recurrent hoarseness , swallowing issues, chest pain, palpitations, edema, persistant/recurrent cough, hemoptysis, dyspnea (rest/exertional/paroxysmal nocturnal), gastrointestinal bleeding (melena, rectal bleeding), abdominal pain, significant heartburn, bowel changes, GU symptoms (dysuria, hematuria, incontinence), Gyn symptoms (abnormal  bleeding, pain),  syncope, focal weakness, memory loss, numbness & tingling, skin/hair/nail changes, abnormal bruising or bleeding, anxiety, or depression.     Objective:   Physical Exam General Appearance:    Alert, cooperative, no distress, appears stated age  Head:    Normocephalic, without obvious abnormality, atraumatic  Eyes:    PERRL, conjunctiva/corneas clear, EOM's intact, fundi    benign, both eyes  Ears:    Normal TM's and external ear canals, both ears  Nose:   Nares normal, septum midline, mucosa normal, no drainage    or sinus tenderness  Throat:   Lips, mucosa, and tongue normal; teeth and gums normal  Neck:   Supple, symmetrical, trachea midline, no adenopathy;    Thyroid: no enlargement/tenderness/nodules  Back:     Symmetric, no curvature, ROM normal, no CVA tenderness  Lungs:     Clear to auscultation bilaterally, respirations unlabored  Chest Wall:    No tenderness or deformity   Heart:    Regular rate and rhythm, S1 and S2 normal, no murmur, rub   or gallop  Breast Exam:    Deferred to mammo  Abdomen:     Soft, non-tender, bowel sounds active all four quadrants,    no masses, no organomegaly  Genitalia:    Deferred at pt's request  Rectal:    Extremities:   Extremities normal, atraumatic, no  cyanosis or edema  Pulses:   2+ and symmetric all extremities  Skin:   Skin color, texture, turgor normal, no rashes or lesions  Lymph nodes:   Cervical, supraclavicular, and axillary nodes normal  Neurologic:   CNII-XII intact, normal strength, sensation and reflexes    throughout          Assessment & Plan:

## 2017-07-02 NOTE — Assessment & Plan Note (Signed)
Pt has hx of this.  Check labs and replete prn. 

## 2017-07-03 LAB — BASIC METABOLIC PANEL
BUN: 25 mg/dL (ref 7–25)
CO2: 29 mmol/L (ref 20–32)
Calcium: 10.4 mg/dL (ref 8.6–10.4)
Chloride: 106 mmol/L (ref 98–110)
Creat: 0.87 mg/dL (ref 0.50–1.05)
Glucose, Bld: 80 mg/dL (ref 65–99)
Potassium: 4.3 mmol/L (ref 3.5–5.3)
Sodium: 140 mmol/L (ref 135–146)

## 2017-07-03 LAB — LIPID PANEL
Cholesterol: 152 mg/dL (ref <200)
HDL: 56 mg/dL (ref >50)
LDL Cholesterol (Calc): 78 mg/dL (calc)
Non-HDL Cholesterol (Calc): 96 mg/dL (calc) (ref <130)
Total CHOL/HDL Ratio: 2.7 (calc) (ref <5.0)
Triglycerides: 97 mg/dL (ref <150)

## 2017-07-03 LAB — CBC WITH DIFFERENTIAL/PLATELET
Basophils Absolute: 48 cells/uL (ref 0–200)
Basophils Relative: 0.7 %
Eosinophils Absolute: 48 cells/uL (ref 15–500)
Eosinophils Relative: 0.7 %
HCT: 38.2 % (ref 35.0–45.0)
Hemoglobin: 12.8 g/dL (ref 11.7–15.5)
Lymphs Abs: 2434 cells/uL (ref 850–3900)
MCH: 28.2 pg (ref 27.0–33.0)
MCHC: 33.5 g/dL (ref 32.0–36.0)
MCV: 84.1 fL (ref 80.0–100.0)
MPV: 10.6 fL (ref 7.5–12.5)
Monocytes Relative: 8.7 %
Neutro Abs: 3679 cells/uL (ref 1500–7800)
Neutrophils Relative %: 54.1 %
Platelets: 267 10*3/uL (ref 140–400)
RBC: 4.54 10*6/uL (ref 3.80–5.10)
RDW: 14.1 % (ref 11.0–15.0)
Total Lymphocyte: 35.8 %
WBC mixed population: 592 cells/uL (ref 200–950)
WBC: 6.8 10*3/uL (ref 3.8–10.8)

## 2017-07-03 LAB — HEPATIC FUNCTION PANEL
AG Ratio: 1.6 (calc) (ref 1.0–2.5)
ALT: 14 U/L (ref 6–29)
AST: 15 U/L (ref 10–35)
Albumin: 4.4 g/dL (ref 3.6–5.1)
Alkaline phosphatase (APISO): 38 U/L (ref 33–130)
Bilirubin, Direct: 0.1 mg/dL (ref 0.0–0.2)
Globulin: 2.7 g/dL (calc) (ref 1.9–3.7)
Indirect Bilirubin: 0.2 mg/dL (calc) (ref 0.2–1.2)
Total Bilirubin: 0.3 mg/dL (ref 0.2–1.2)
Total Protein: 7.1 g/dL (ref 6.1–8.1)

## 2017-07-03 LAB — TSH: TSH: 0.95 mIU/L (ref 0.40–4.50)

## 2017-07-03 LAB — VITAMIN D 25 HYDROXY (VIT D DEFICIENCY, FRACTURES): Vit D, 25-Hydroxy: 39 ng/mL (ref 30–100)

## 2017-07-04 ENCOUNTER — Encounter: Payer: Self-pay | Admitting: General Practice

## 2017-07-17 ENCOUNTER — Other Ambulatory Visit: Payer: Self-pay | Admitting: Family Medicine

## 2017-07-24 ENCOUNTER — Ambulatory Visit (INDEPENDENT_AMBULATORY_CARE_PROVIDER_SITE_OTHER): Payer: PRIVATE HEALTH INSURANCE | Admitting: Family Medicine

## 2017-07-24 ENCOUNTER — Other Ambulatory Visit (HOSPITAL_COMMUNITY)
Admission: RE | Admit: 2017-07-24 | Discharge: 2017-07-24 | Disposition: A | Discharge status: Home / Self Care | Payer: PRIVATE HEALTH INSURANCE | Source: Ambulatory Visit | Attending: Family Medicine | Admitting: Family Medicine

## 2017-07-24 ENCOUNTER — Other Ambulatory Visit: Payer: Self-pay

## 2017-07-24 ENCOUNTER — Encounter: Payer: Self-pay | Admitting: Family Medicine

## 2017-07-24 VITALS — BP 121/81 | HR 66 | Temp 97.9°F | Resp 16 | Ht 65.0 in | Wt 212.0 lb

## 2017-07-24 DIAGNOSIS — Z124 Encounter for screening for malignant neoplasm of cervix: Secondary | ICD-10-CM

## 2017-07-24 NOTE — Patient Instructions (Signed)
Follow up in 1 year (mid January) for your next physical- sooner if needed We'll notify you of your pap results and make any changes if needed Call with any questions or concerns [place]!!!

## 2017-07-24 NOTE — Progress Notes (Signed)
   Subjective:    Patient ID: Phyllis Hebert, female    DOB: 12/06/1955, 62 y.o.   MRN: [id number]  HPI Pap- pt has no concerns today.  No vaginal d/c, no pain.  No hx of abnormal paps.  UTD on mammogram will do breast exam today.   Review of Systems For ROS see HPI     Objective:   Physical Exam  Constitutional: She appears well-developed and well-nourished. No distress.  Pulmonary/Chest: Right breast exhibits no inverted nipple, no mass, no nipple discharge, no skin change and no tenderness. Left breast exhibits no inverted nipple, no mass, no nipple discharge, no skin change and no tenderness.  Genitourinary: Rectal exam shows no external hemorrhoid and anal tone normal. No breast swelling, tenderness, discharge or bleeding. There is no rash, tenderness, lesion or injury on the right labia. There is no rash, tenderness, lesion or injury on the left labia. Uterus is not deviated, not enlarged, not fixed and not tender. Cervix exhibits no motion tenderness, no discharge and no friability (large, external transformation zone). Right adnexum displays no mass, no tenderness and no fullness. Left adnexum displays no mass, no tenderness and no fullness. No erythema, tenderness or bleeding in the vagina. No foreign body in the vagina. No vaginal discharge found.  Vitals reviewed.         Assessment & Plan:  Pap- pt w/o vaginal concerns and no hx of abnormal pap.  She does have large external transformation zone.  Pap collected.  HPV cotesting done.

## 2017-07-26 ENCOUNTER — Encounter: Payer: Self-pay | Admitting: General Practice

## 2017-07-26 LAB — CYTOLOGY - PAP
Diagnosis: NEGATIVE
HPV: NOT DETECTED

## 2018-01-02 ENCOUNTER — Other Ambulatory Visit: Payer: Self-pay | Admitting: Family Medicine

## 2018-01-02 DIAGNOSIS — Z1231 Encounter for screening mammogram for malignant neoplasm of breast: Secondary | ICD-10-CM

## 2018-02-11 ENCOUNTER — Ambulatory Visit
Admission: RE | Admit: 2018-02-11 | Discharge: 2018-02-11 | Disposition: A | Discharge status: Home / Self Care | Payer: PRIVATE HEALTH INSURANCE | Source: Ambulatory Visit | Attending: Family Medicine | Admitting: Family Medicine

## 2018-02-11 DIAGNOSIS — Z1231 Encounter for screening mammogram for malignant neoplasm of breast: Secondary | ICD-10-CM

## 2018-04-19 ENCOUNTER — Other Ambulatory Visit: Payer: Self-pay | Admitting: Family Medicine

## 2018-08-07 ENCOUNTER — Encounter: Payer: Self-pay | Admitting: Family Medicine

## 2018-08-07 ENCOUNTER — Ambulatory Visit (INDEPENDENT_AMBULATORY_CARE_PROVIDER_SITE_OTHER): Payer: PRIVATE HEALTH INSURANCE | Admitting: Family Medicine

## 2018-08-07 ENCOUNTER — Other Ambulatory Visit: Payer: Self-pay

## 2018-08-07 VITALS — BP 120/81 | HR 78 | Temp 98.0°F | Resp 16 | Ht 65.0 in | Wt 204.5 lb

## 2018-08-07 DIAGNOSIS — Z0000 Encounter for general adult medical examination without abnormal findings: Secondary | ICD-10-CM

## 2018-08-07 DIAGNOSIS — E669 Obesity, unspecified: Secondary | ICD-10-CM | POA: Diagnosis not present

## 2018-08-07 DIAGNOSIS — E559 Vitamin D deficiency, unspecified: Secondary | ICD-10-CM | POA: Diagnosis not present

## 2018-08-07 LAB — BASIC METABOLIC PANEL
BUN: 24 mg/dL — ABNORMAL HIGH (ref 6–23)
CO2: 29 mEq/L (ref 19–32)
Calcium: 10.4 mg/dL (ref 8.4–10.5)
Chloride: 103 mEq/L (ref 96–112)
Creatinine, Ser: 0.85 mg/dL (ref 0.40–1.20)
GFR: 67.99 mL/min (ref >60.00)
Glucose, Bld: 82 mg/dL (ref 70–99)
Potassium: 3.8 mEq/L (ref 3.5–5.1)
Sodium: 140 mEq/L (ref 135–145)

## 2018-08-07 LAB — LIPID PANEL
Cholesterol: 151 mg/dL (ref 0–200)
HDL: 59.6 mg/dL (ref >39.00)
LDL Cholesterol: 72 mg/dL (ref 0–99)
NonHDL: 91.28
Total CHOL/HDL Ratio: 3
Triglycerides: 96 mg/dL (ref 0.0–149.0)
VLDL: 19.2 mg/dL (ref 0.0–40.0)

## 2018-08-07 LAB — HEPATIC FUNCTION PANEL
ALT: 15 U/L (ref 0–35)
AST: 15 U/L (ref 0–37)
Albumin: 4.7 g/dL (ref 3.5–5.2)
Alkaline Phosphatase: 41 U/L (ref 39–117)
Bilirubin, Direct: 0.1 mg/dL (ref 0.0–0.3)
Total Bilirubin: 0.4 mg/dL (ref 0.2–1.2)
Total Protein: 7.4 g/dL (ref 6.0–8.3)

## 2018-08-07 LAB — TSH: TSH: 0.92 u[IU]/mL (ref 0.35–4.50)

## 2018-08-07 LAB — CBC WITH DIFFERENTIAL/PLATELET
Basophils Absolute: 0 10*3/uL (ref 0.0–0.1)
Basophils Relative: 0.3 % (ref 0.0–3.0)
Eosinophils Absolute: 0.1 10*3/uL (ref 0.0–0.7)
Eosinophils Relative: 1 % (ref 0.0–5.0)
HCT: 41.3 % (ref 36.0–46.0)
Hemoglobin: 13.8 g/dL (ref 12.0–15.0)
Lymphocytes Relative: 31.5 % (ref 12.0–46.0)
Lymphs Abs: 2.1 10*3/uL (ref 0.7–4.0)
MCHC: 33.3 g/dL (ref 30.0–36.0)
MCV: 86.6 fl (ref 78.0–100.0)
Monocytes Absolute: 0.5 10*3/uL (ref 0.1–1.0)
Monocytes Relative: 7.1 % (ref 3.0–12.0)
Neutro Abs: 4 10*3/uL (ref 1.4–7.7)
Neutrophils Relative %: 60.1 % (ref 43.0–77.0)
Platelets: 268 10*3/uL (ref 150.0–400.0)
RBC: 4.77 Mil/uL (ref 3.87–5.11)
RDW: 15.2 % (ref 11.5–15.5)
WBC: 6.7 10*3/uL (ref 4.0–10.5)

## 2018-08-07 LAB — VITAMIN D 25 HYDROXY (VIT D DEFICIENCY, FRACTURES): VITD: 39.21 ng/mL (ref 30.00–100.00)

## 2018-08-07 NOTE — Assessment & Plan Note (Signed)
Pt is down 8 lbs since last visit.  Applauded her efforts.  Check labs to risk stratify.  Will follow.

## 2018-08-07 NOTE — Patient Instructions (Signed)
Follow up in 6 months to recheck weight loss progress and cholesterol We'll notify you of your lab results and make any changes if needed Continue to work on healthy diet and regular exercise- you're doing great! Call with any questions or concerns Happy Bel[employer] Birthday!!!

## 2018-08-07 NOTE — Assessment & Plan Note (Signed)
Pt has hx of this.  Check labs and replete prn. 

## 2018-08-07 NOTE — Progress Notes (Signed)
   Subjective:    Patient ID: Phyllis Hebert, female    DOB: Jan 31, 1956, 63 y.o.   MRN: [id number]  HPI CPE- UTD on pap, mammo, immunizations.  Due for repeat colonoscopy this year- pt got reminder letter and plans to schedule.  Down 8 lbs- now walking 6x/week, 30-45 minutes/day.     Review of Systems Patient reports no vision/ hearing changes, adenopathy,fever, weight change,  persistant/recurrent hoarseness , swallowing issues, chest pain, palpitations, edema, persistant/recurrent cough, hemoptysis, dyspnea (rest/exertional/paroxysmal nocturnal), gastrointestinal bleeding (melena, rectal bleeding), abdominal pain, significant heartburn, bowel changes, GU symptoms (dysuria, hematuria, incontinence), Gyn symptoms (abnormal  bleeding, pain),  syncope, focal weakness, memory loss, numbness & tingling, skin/hair/nail changes, abnormal bruising or bleeding, anxiety, or depression.     Objective:   Physical Exam General Appearance:    Alert, cooperative, no distress, appears stated age  Head:    Normocephalic, without obvious abnormality, atraumatic  Eyes:    PERRL, conjunctiva/corneas clear, EOM's intact, fundi    benign, both eyes  Ears:    Normal TM's and external ear canals, both ears  Nose:   Nares normal, septum midline, mucosa normal, no drainage    or sinus tenderness  Throat:   Lips, mucosa, and tongue normal; teeth and gums normal  Neck:   Supple, symmetrical, trachea midline, no adenopathy;    Thyroid: no enlargement/tenderness/nodules  Back:     Symmetric, no curvature, ROM normal, no CVA tenderness  Lungs:     Clear to auscultation bilaterally, respirations unlabored  Chest Wall:    No tenderness or deformity   Heart:    Regular rate and rhythm, S1 and S2 normal, no murmur, rub   or gallop  Breast Exam:    Deferred to GYN  Abdomen:     Soft, non-tender, bowel sounds active all four quadrants,    no masses, no organomegaly  Genitalia:    Deferred to GYN  Rectal:      Extremities:   Extremities normal, atraumatic, no cyanosis or edema  Pulses:   2+ and symmetric all extremities  Skin:   Skin color, texture, turgor normal, no rashes or lesions  Lymph nodes:   Cervical, supraclavicular, and axillary nodes normal  Neurologic:   CNII-XII intact, normal strength, sensation and reflexes    throughout          Assessment & Plan:

## 2018-08-08 ENCOUNTER — Encounter: Payer: Self-pay | Admitting: General Practice

## 2018-11-03 ENCOUNTER — Other Ambulatory Visit: Payer: Self-pay | Admitting: Family Medicine

## 2019-01-17 ENCOUNTER — Other Ambulatory Visit: Payer: Self-pay | Admitting: Family Medicine

## 2019-01-17 DIAGNOSIS — Z1231 Encounter for screening mammogram for malignant neoplasm of breast: Secondary | ICD-10-CM

## 2019-02-03 ENCOUNTER — Ambulatory Visit: Payer: PRIVATE HEALTH INSURANCE | Admitting: Family Medicine

## 2019-02-18 ENCOUNTER — Ambulatory Visit: Payer: PRIVATE HEALTH INSURANCE | Admitting: Family Medicine

## 2019-03-05 ENCOUNTER — Other Ambulatory Visit: Payer: Self-pay

## 2019-03-05 ENCOUNTER — Ambulatory Visit
Admission: RE | Admit: 2019-03-05 | Discharge: 2019-03-05 | Disposition: A | Discharge status: Home / Self Care | Payer: PRIVATE HEALTH INSURANCE | Source: Ambulatory Visit | Attending: Family Medicine | Admitting: Family Medicine

## 2019-03-05 DIAGNOSIS — Z1231 Encounter for screening mammogram for malignant neoplasm of breast: Secondary | ICD-10-CM

## 2019-05-05 ENCOUNTER — Other Ambulatory Visit: Payer: Self-pay | Admitting: Family Medicine

## 2019-06-20 DIAGNOSIS — Z8616 Personal history of COVID-19: Secondary | ICD-10-CM

## 2019-06-20 HISTORY — DX: Personal history of COVID-19: Z86.16

## 2019-06-26 ENCOUNTER — Other Ambulatory Visit: Payer: Self-pay

## 2019-06-26 ENCOUNTER — Ambulatory Visit (INDEPENDENT_AMBULATORY_CARE_PROVIDER_SITE_OTHER): Payer: PRIVATE HEALTH INSURANCE | Admitting: Family Medicine

## 2019-06-26 ENCOUNTER — Encounter: Payer: Self-pay | Admitting: Family Medicine

## 2019-06-26 ENCOUNTER — Encounter (INDEPENDENT_AMBULATORY_CARE_PROVIDER_SITE_OTHER): Payer: Self-pay

## 2019-06-26 DIAGNOSIS — U071 COVID-19: Secondary | ICD-10-CM | POA: Diagnosis not present

## 2019-06-26 NOTE — Progress Notes (Signed)
I have discussed the procedure for the virtual visit with the patient who has given consent to proceed with assessment and treatment.   Pt unable to obtain vitals.   Inocencio Roy L Delara Shepheard, CMA     

## 2019-06-26 NOTE — Progress Notes (Signed)
   Virtual Visit via Video   I connected with patient on 06/26/19 at  8:00 AM EST by a video enabled telemedicine application and verified that I am speaking with the correct person using two identifiers.  Location patient: Home Location provider: [facility]up[facility], Office Persons participating in the virtual visit: Patient, Provider, [place] (Jess B)  I discussed the limitations of evaluation and management by telemedicine and the availability of in person appointments. The patient expressed understanding and agreed to proceed.  Subjective:   HPI:   COVID- pt was dx'd on 1/1 at [facility].  Husband also sick.  Pt had mild sxs that prompted testing.  Has felt worse since testing.  Now having daily fevers to 101 in the afternoon that will last 4-5 hrs.  Denies chest congestion.  + cough.  Sore throat started yesterday.  Denies SOB.  Drinking a lot of fluids.  + anorexia.  Taking Vit D, C, and Zinc.  Taking tylenol and ibuprofen alternating.    ROS:   See pertinent positives and negatives per HPI.  Patient Active Problem List   Diagnosis Date Noted  . Obesity (BMI 30-39.9) 08/07/2018  . Hypertriglyceridemia 07/02/2017  . Vitamin D deficiency 07/02/2017  . Physical exam 07/02/2017  . 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 Smoking status: Never Smoker  . Smokeless tobacco: Never Used  Substance Use Topics  . Alcohol use: No    Current Outpatient Medications:  .  Cholecalciferol (VITAMIN D3) 2000 units TABS, Take 2 tablets by mouth daily., Disp: , Rfl:  .  fenofibrate 160 MG tablet, TAKE 1 TABLET BY MOUTH EVERY DAY, Disp: 30 tablet, Rfl: 5 .  hydroxychloroquine (PLAQUENIL) 200 MG tablet, 2 TABLET WITH FOOD OR MILK ONCE A DAY ORALLY 30, Disp: , Rfl: 2 .  meloxicam (MOBIC) 15 MG tablet, Take 15 mg by mouth daily., Disp: , Rfl: 3  Allergies  Allergen Reactions  . Penicillins Rash    Objective:   SpO2 93%  AAOx3, NAD NCAT, EOMI No obvious  CN deficits Coloring WNL Pt is able to speak clearly, coherently without shortness of breath or increased work of breathing.  Thought process is linear.  Mood is appropriate.   Assessment and Plan:   COVID- pt was tested and confirmed on 1/1.  Since then has had daily fever.  Denies SOB.  Able to drink plenty of fluids.  Taking Vit D, C, and Zinc.  Reviewed supportive care and red flags that should prompt return.  Pt expressed understanding and is in agreement w/ plan.    Annye Asa, MD 06/26/2019

## 2019-06-27 ENCOUNTER — Encounter (INDEPENDENT_AMBULATORY_CARE_PROVIDER_SITE_OTHER): Payer: Self-pay

## 2019-06-28 ENCOUNTER — Encounter (INDEPENDENT_AMBULATORY_CARE_PROVIDER_SITE_OTHER): Payer: Self-pay

## 2019-06-29 ENCOUNTER — Telehealth: Payer: Self-pay

## 2019-06-29 ENCOUNTER — Encounter (INDEPENDENT_AMBULATORY_CARE_PROVIDER_SITE_OTHER): Payer: Self-pay

## 2019-06-29 NOTE — Telephone Encounter (Signed)
Called pt and pt stated that she did not sleep well last night and feels more fatigue. Pt advised if she has worsening weakness with inability to stand or if she has to hold onto something to keep balance advised pt to call 911 and seek tx in the ED.

## 2019-07-01 ENCOUNTER — Encounter (INDEPENDENT_AMBULATORY_CARE_PROVIDER_SITE_OTHER): Payer: Self-pay

## 2019-07-03 ENCOUNTER — Encounter (INDEPENDENT_AMBULATORY_CARE_PROVIDER_SITE_OTHER): Payer: Self-pay

## 2019-07-04 ENCOUNTER — Encounter (INDEPENDENT_AMBULATORY_CARE_PROVIDER_SITE_OTHER): Payer: Self-pay

## 2019-07-17 ENCOUNTER — Encounter: Payer: Self-pay | Admitting: *Deleted

## 2020-02-04 ENCOUNTER — Other Ambulatory Visit: Payer: Self-pay | Admitting: Family Medicine

## 2020-02-04 DIAGNOSIS — Z1231 Encounter for screening mammogram for malignant neoplasm of breast: Secondary | ICD-10-CM

## 2020-02-25 ENCOUNTER — Other Ambulatory Visit: Payer: Self-pay | Admitting: Family Medicine

## 2020-03-23 ENCOUNTER — Other Ambulatory Visit: Payer: Self-pay

## 2020-03-23 ENCOUNTER — Ambulatory Visit
Admission: RE | Admit: 2020-03-23 | Discharge: 2020-03-23 | Disposition: A | Discharge status: Home / Self Care | Payer: Managed Care, Other (non HMO) | Source: Ambulatory Visit | Attending: Family Medicine | Admitting: Family Medicine

## 2020-03-23 DIAGNOSIS — Z1231 Encounter for screening mammogram for malignant neoplasm of breast: Secondary | ICD-10-CM

## 2020-04-01 ENCOUNTER — Inpatient Hospital Stay: Payer: Managed Care, Other (non HMO)

## 2020-04-01 ENCOUNTER — Encounter: Payer: Self-pay | Admitting: Gynecologic Oncology

## 2020-04-01 ENCOUNTER — Other Ambulatory Visit: Payer: Self-pay

## 2020-04-01 ENCOUNTER — Inpatient Hospital Stay: Payer: Managed Care, Other (non HMO) | Attending: Gynecologic Oncology | Admitting: Gynecologic Oncology

## 2020-04-01 ENCOUNTER — Encounter: Payer: Self-pay | Admitting: Oncology

## 2020-04-01 VITALS — BP 147/61 | HR 73 | Temp 98.3°F | Resp 18 | Ht 64.5 in | Wt 212.0 lb

## 2020-04-01 DIAGNOSIS — C530 Malignant neoplasm of endocervix: Secondary | ICD-10-CM | POA: Diagnosis present

## 2020-04-01 DIAGNOSIS — Z79899 Other long term (current) drug therapy: Secondary | ICD-10-CM | POA: Diagnosis not present

## 2020-04-01 DIAGNOSIS — Z791 Long term (current) use of non-steroidal anti-inflammatories (NSAID): Secondary | ICD-10-CM | POA: Diagnosis not present

## 2020-04-01 DIAGNOSIS — M329 Systemic lupus erythematosus, unspecified: Secondary | ICD-10-CM | POA: Insufficient documentation

## 2020-04-01 DIAGNOSIS — C539 Malignant neoplasm of cervix uteri, unspecified: Secondary | ICD-10-CM

## 2020-04-01 DIAGNOSIS — E669 Obesity, unspecified: Secondary | ICD-10-CM | POA: Insufficient documentation

## 2020-04-01 DIAGNOSIS — C169 Malignant neoplasm of stomach, unspecified: Secondary | ICD-10-CM | POA: Diagnosis not present

## 2020-04-01 DIAGNOSIS — Z8542 Personal history of malignant neoplasm of other parts of uterus: Secondary | ICD-10-CM | POA: Diagnosis not present

## 2020-04-01 LAB — BASIC METABOLIC PANEL
Anion gap: 4 — ABNORMAL LOW (ref 5–15)
BUN: 24 mg/dL — ABNORMAL HIGH (ref 8–23)
CO2: 29 mmol/L (ref 22–32)
Calcium: 10 mg/dL (ref 8.9–10.3)
Chloride: 106 mmol/L (ref 98–111)
Creatinine, Ser: 1.34 mg/dL — ABNORMAL HIGH (ref 0.44–1.00)
GFR, Estimated: 42 mL/min — ABNORMAL LOW (ref >60)
Glucose, Bld: 90 mg/dL (ref 70–99)
Potassium: 4.5 mmol/L (ref 3.5–5.1)
Sodium: 139 mmol/L (ref 135–145)

## 2020-04-01 LAB — CBC WITH DIFFERENTIAL (CANCER CENTER ONLY)
Abs Immature Granulocytes: 0.02 10*3/uL (ref 0.00–0.07)
Basophils Absolute: 0 10*3/uL (ref 0.0–0.1)
Basophils Relative: 0 %
Eosinophils Absolute: 0.1 10*3/uL (ref 0.0–0.5)
Eosinophils Relative: 1 %
HCT: 35 % — ABNORMAL LOW (ref 36.0–46.0)
Hemoglobin: 11.6 g/dL — ABNORMAL LOW (ref 12.0–15.0)
Immature Granulocytes: 0 %
Lymphocytes Relative: 22 %
Lymphs Abs: 2 10*3/uL (ref 0.7–4.0)
MCH: 28.6 pg (ref 26.0–34.0)
MCHC: 33.1 g/dL (ref 30.0–36.0)
MCV: 86.2 fL (ref 80.0–100.0)
Monocytes Absolute: 0.5 10*3/uL (ref 0.1–1.0)
Monocytes Relative: 6 %
Neutro Abs: 6.1 10*3/uL (ref 1.7–7.7)
Neutrophils Relative %: 71 %
Platelet Count: 282 10*3/uL (ref 150–400)
RBC: 4.06 MIL/uL (ref 3.87–5.11)
RDW: 13.6 % (ref 11.5–15.5)
WBC Count: 8.8 10*3/uL (ref 4.0–10.5)
nRBC: 0 % (ref 0.0–0.2)

## 2020-04-01 LAB — CEA (IN HOUSE-CHCC): CEA (CHCC-In House): 29.04 ng/mL — ABNORMAL HIGH (ref 0.00–5.00)

## 2020-04-01 NOTE — Progress Notes (Signed)
Consult Note: Gyn-Onc  Consult was requested by Phyllis Hebert for the evaluation of Phyllis Hebert 64 y.o. female  CC:  Chief Complaint  Patient presents with  . Cervical Cancer    Assessment/Plan:  Phyllis Hebert  is a 64 y.o.  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  I am recommending further diagnostic work-up with a PET scan and evaluation from gastroenterology for consideration of EGD.  If no primary upper gastrointestinal lesion is identified, will make presumptive diagnosis that this is a primary gastric type endocervical adenocarcinoma, stage IIb.  I discussed with the patient and her husband that the recommended treatment for cervical cancer that is locally advanced as definitive chemoradiation.  I described the potential course of treatment including duration and likelihood of cure.  This would be curative intent therapy unless imaging suggests stage IV disease.  I am recommending external beam and brachytherapy as well as weekly cisplatin chemotherapy and she will see medical oncology and radiation oncology to discuss this further.  She is planning on a vacation between October 23 and October 30, therefore we will attempt to get work-up done prior to that time with plan for initiation of therapy after she returns.  She understands if she were to develop heavy bleeding while on vacation, she will need to return to be considered for early initiation of radiation.   HPI: Ms Phyllis Hebert is a 64 year old P2 who was seen in consultation at the request of Dr Murrell Hebert for evaluation of adenocarcinoma of the cervix.  The patient had history of new onset vaginal spotting on March 16, 2020.  She immediately called her OB/GYN's office and achieved an appointment with her OB/GYN's partner, Phyllis Hebert, who saw and evaluated the patient on March 19, 2020.   At that time a transvaginal ultrasound scan was performed which revealed a uterus measuring 5.3 x 8.4 x 4.6 cm with an ill-defined endometrium.  There is a complex cyst on the left ovary measuring 2.6 cm with peripheral blood flow noted.  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  Immunostains were positive for CEA and CDX2, negative for p16, ER, vimentin, and PAX8.  The differential diagnosis included primary gastric type endocervical adenocarcinoma (though this typically stains positive for PAX8) or a metastasis from pancreaticobiliary or upper GI primary.  Her medical history is most significant for obesity and lupus for which she takes Plaquenil.  Her surgical history is most significant for a NovaSure ablation in 2010 for abnormal uterine bleeding.  Her gynecologic history is remarkable for abnormal uterine bleeding at age 21 treated with NovaSure ablation.  She is had 2 prior vaginal deliveries.  Her family cancer history is significant for her mother reporting a history of uterine cancer treated with surgery at age 67s.  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 She lives with her husband.  They are not sexually active.   Current Meds:  Outpatient Encounter Medications as of 04/01/2020  Medication Sig  . Cholecalciferol (VITAMIN D3) 2000 units TABS Take 2 tablets by mouth daily.  . clobetasol cream (TEMOVATE) 0.05 % Apply topically. Left leg  . fenofibrate 160 MG tablet TAKE 1 TABLET BY MOUTH EVERY DAY  . hydroxychloroquine (PLAQUENIL) 200 MG tablet 2 TABLET WITH FOOD OR  MILK ONCE A DAY ORALLY 30  . meloxicam (MOBIC) 15 MG tablet Take 15 mg by mouth daily.   No facility-administered encounter medications on file as of 04/01/2020.    Allergy:  Allergies  Allergen Reactions  . Penicillins Rash    Social Hx:    Social History   Socioeconomic History  . Marital status: Married    Spouse name: Not on file  . Number of children: Not on file  . Years of education: Not on file  . Highest education level: Not on file  Occupational History  . Not on file  Tobacco Use  . Smoking status: Never Smoker  . Smokeless tobacco: Never Used  Substance and Sexual Activity  . Alcohol use: No  . Drug use: No  . Sexual activity: Not on file  Other Topics Concern  . Not on file  Social History Narrative  . Not on file   Social Determinants of Health   Financial Resource Strain:   . Difficulty of Paying Living Expenses: Not on file  Food Insecurity:   . Worried About Charity fundraiser in the Last Year: Not on file  . Ran Out of Food in the Last Year: Not on file  Transportation Needs:   . Lack of Transportation (Medical): Not on file  . Lack of Transportation (Non-Medical): Not on file  Physical Activity:   . Days of Exercise per Week: Not on file  . Minutes of Exercise per Session: Not on file  Stress:   . Feeling of Stress : Not on file  Social Connections:   . Frequency of Communication with Friends and Family: Not on file  . Frequency of Social Gatherings with Friends and Family: Not on file  . Attends Religious Services: Not on file  . Active Member of Clubs or Organizations: Not on file  . Attends Archivist Meetings: Not on file  . Marital Status: Not on file  Intimate Partner Violence:   . Fear of Current or Ex-Partner: Not on file  . Emotionally Abused: Not on file  . Physically Abused: Not on file  . Sexually Abused: Not on file    Past Surgical Hx:  Past Surgical History:  Procedure Laterality Date  . ENDOMETRIAL ABLATION      Past Medical Hx:  Past Medical History:  Diagnosis Date  . Lupus [facility])     Past Gynecological History: See HPI No LMP recorded. Patient is postmenopausal.  Family Hx:  Family History  Problem Relation Age of Onset  . Hypertension  Mother   . Cancer Mother        uterine  . Emphysema Mother   . Hypertension Father   . Stroke Father   . Hypertension Brother   . Healthy Daughter   . Stroke Paternal Uncle     Review of Systems:  Constitutional  Feels well,    ENT Normal appearing ears and nares bilaterally Skin/Breast  No rash, sores, jaundice, itching, dryness Cardiovascular  No chest pain, shortness of breath, or edema  Pulmonary  No cough or wheeze.  Gastro Intestinal  No nausea, vomitting, or diarrhoea. No bright red blood per rectum, no abdominal pain, change in bowel movement,+ constipation.  Genito Urinary  No frequency, urgency, dysuria, positive for postmenopausal bleeding and uterine cramping Musculo Skeletal  No myalgia, arthralgia, joint swelling or pain  Neurologic  No weakness, numbness, change in gait,  Psychology  No depression, anxiety, insomnia.   Vitals:  Blood pressure Marland Kitchen)  147/61, pulse 73, temperature 98.3 F (36.8 C), temperature source Tympanic, resp. rate 18, height 5' 4.5" (1.638 m), weight 212 lb (96.2 kg), SpO2 100 %.  Physical Exam: WD in NAD Neck  Supple NROM, without any enlargements.  Lymph Node Survey No cervical supraclavicular or inguinal adenopathy Cardiovascular  Pulse normal rate, regularity and rhythm. S1 and S2 normal.  Lungs  Clear to auscultation bilateraly, without wheezes/crackles/rhonchi. Good air movement.  Skin  No rash/lesions/breakdown  Psychiatry  Alert and oriented to person, place, and time  Abdomen  Normoactive bowel sounds, abdomen soft, non-tender and obese without evidence of hernia.  Back No CVA tenderness Genito Urinary  Vulva/vagina: Normal external female genitalia.  No lesions. No discharge or bleeding.  Bladder/urethra:  No lesions or masses, well supported bladder  Vagina: The upper vagina posteriorly and anteriorly encroaches upon the cervical tumor mass and is likely involved at this most upper level  Cervix: Replaced by a 4  to 5 cm mass which appears most significant on the anterior lip of the cervix.  It is rock hard and fixed.  It is friable.  The parametria bilaterally feel infiltrated with tumor (right greater than left) but without sidewall extension.  Uterus:  Small, not mobile, + parametrial involvement  Adnexa: No discretely palpable masses. Rectal  Good tone, + parametrial infiltration but not to the sidewalls Extremities  No bilateral cyanosis, clubbing or edema.  60 minutes of total time was spent for this patient encounter, including preparation, face-to-face counseling with the patient and coordination of care, review of imaging (results and images), communication with the referring provider and documentation of the encounter.   Thereasa Solo, MD  04/01/2020, 9:29 AM

## 2020-04-01 NOTE — Progress Notes (Signed)
Met with Phyllis Hebert and her husband after their visit with Dr. Denman George.  Went over upcoming appointments and treatment plan and provided her with the [employer]   Called [facility] with urgent referral and an appointment was scheduled on 04/05/20 to discuss urgent EGD.    Called [place] and advised her of appointment with Dr. Tarri Glenn at [facility] on 04/05/20 and also scheduled new patient appointment with Dr. Alvy Bimler on 04/19/20 at 2:00 with 1:30 arrival.  She verbalized understanding and agreement of all appointments.

## 2020-04-01 NOTE — Patient Instructions (Signed)
Dr. Denman George has scheduled a PET scan and also would like you to see Dr. Alvy Bimler with Medical Oncology and Dr. Sondra Come with Radiation Oncology.  In addition, she would like you to have an EGD.

## 2020-04-05 ENCOUNTER — Encounter: Payer: Self-pay | Admitting: Gastroenterology

## 2020-04-05 ENCOUNTER — Telehealth: Payer: Self-pay | Admitting: Gastroenterology

## 2020-04-05 ENCOUNTER — Ambulatory Visit: Payer: Managed Care, Other (non HMO) | Admitting: Gastroenterology

## 2020-04-05 VITALS — BP 142/78 | HR 80 | Ht 64.17 in | Wt 212.0 lb

## 2020-04-05 DIAGNOSIS — R970 Elevated carcinoembryonic antigen [CEA]: Secondary | ICD-10-CM

## 2020-04-05 DIAGNOSIS — R140 Abdominal distension (gaseous): Secondary | ICD-10-CM

## 2020-04-05 MED ORDER — PLENVU 140 G PO SOLR: 140.0000 g | ORAL | 0 refills | Status: DC

## 2020-04-05 NOTE — Telephone Encounter (Signed)
Rep from [facility] called to advise that the prep medication requires prior auth and he provided alternative that will be covered for patient. Sutab, Suprep or [employer]

## 2020-04-05 NOTE — Telephone Encounter (Signed)
plenvu has an Advertising account planner coupon attached to lower cost to no more than $50

## 2020-04-05 NOTE — Patient Instructions (Addendum)
If you are age 62 or older, your body mass index should be between 23-30. Your Body mass index is 36.19 kg/m. If this is out of the aforementioned range listed, please consider follow up with your Primary Care Provider.  If you are age 83 or younger, your body mass index should be between 19-25. Your Body mass index is 36.19 kg/m. If this is out of the aformentioned range listed, please consider follow up with your Primary Care Provider.   I am recommending an endoscopic evaluation with EGD and colonoscopy  You have been scheduled for an endoscopy and colonoscopy. Please follow the written instructions given to you at your visit today. Please pick up your prep supplies at the pharmacy within the next 1-3 days. If you use inhalers (even only as needed), please bring them with you on the day of your procedure.  Please use magnesium citrate 300 mL this afternoon prior to the routine colonoscopy prep.   Tips for colonoscopy:  - Stay well hydrated for 3-4 days prior to the exam. This reduces nausea and dehydration.  - To prevent skin/hemorrhoid irritation - prior to wiping, put A&Dointment or vaseline on the toilet paper. - Keep a towel or pad on the bed.  - Drink  64oz of clear liquids in the morning of prep day (prior to starting the prep) to be sure that there is enough fluid to flush the colon and stay hydrated!!!! This is in addition to the fluids required for preparation. - Use of a flavored hard candy, such as grape Anise Salvo, can counteract some of the flavor of the prep and may prevent some nausea.   Thank you for trusting me with your gastrointestinal care!    Thornton Park, MD, MPH

## 2020-04-05 NOTE — Progress Notes (Signed)
Referring Provider: [name], MD Primary Care Physician:  [name], MD  Reason for Consultation:  adenocarcinoma of the cervix    IMPRESSION:  Adenocarcinoma of the cervix with  primary gastric type endocervical adenocarcinoma or metastases from pancreaticobiliary or upper GI primary Elevated CEA Due for colon cancer screening 2020  Discussed EGD to diagnose primary source of adenocarcinoma.  Patient would strongly like to proceed with concurrent colonoscopy.  If the EGD is nondiagnostic she would benefit from cross-sectional imaging.  A PET scan is scheduled for 04/19/2020.   PLAN: EGD and Colonoscopy  Please see the "Patient Instructions" section for addition details about the plan.  HPI: Phyllis Hebert is a 64 y.o. female referred by Dr. Denman George for further evaluation of adenocarcinoma of the cervix.  The history is obtained through the patient and review of her electronic health record.  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  She has obesity, lupus on Plaquenil, a history of NovaSure ablation for abnormal uterine bleeding in 2010.  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  Biopsies 03/19/2020 showed invasive adenocarcinoma involving the fibrous stroma.  Immunostains were positive for CEA and CDX2, negative for p16, ER, vimentin, and PAX 8.  Given the differential of primary gastric type endocervical adenocarcinoma or metastases from pancreaticobiliary or upper GI primary, Dr. Denman George referred her for possible endoscopy.  An outpatient PET scan is scheduled 04/19/2020.  Her ongoing GI complaint is bloating with associated lower abdominal/pelvic pressure that mimics period pain. The bloating started concurrently with vaginal bleeding. No significant bloating prior to that time.  No other ongoing GI symptoms.  Colonoscopy in 2010 with Dr. Collene Mares. She remembers  1-2 polyps being removed. No short interval surveillance recommended.   Mother with uterine cancer at age 42. No known family history of colon cancer or polyps. No family history of pancreatic cancer or gastric/stomach cancer.   Past Medical History:  Diagnosis Date  . Cervical cancer ([place])   . Colon polyps   . Lupus [facility])     Past Surgical History:  Procedure Laterality Date  . ENDOMETRIAL ABLATION  2010    Current Outpatient Medications  Medication Sig Dispense Refill  . Cholecalciferol (VITAMIN D3) 2000 units TABS Take 2 tablets by mouth daily.    . clobetasol cream (TEMOVATE) 0.05 % Apply topically. Left leg    . fenofibrate 160 MG tablet TAKE 1 TABLET BY MOUTH EVERY DAY 30 tablet 5  . hydroxychloroquine (PLAQUENIL) 200 MG tablet 2 TABLET WITH FOOD OR MILK ONCE A DAY ORALLY 30  2  . meloxicam (MOBIC) 15 MG tablet Take 15 mg by mouth daily.  3   No current facility-administered medications for this visit.    Allergies as of 04/05/2020 - Review Complete 04/05/2020  Allergen Reaction Noted  . Penicillins Rash 06/21/2016    Family History  Problem Relation Age of Onset  . Hypertension Mother   . Emphysema Mother   . Uterine cancer Mother   . Hypertension Father   . Stroke Father   . Hypertension Brother   . Healthy Daughter     Social History   Socioeconomic History  . Marital status: Married    Spouse name: Not on file  . Number of children: 2  . Years of education: Not on file  . Highest education level: Not on file  Occupational History  . Occupation: retired  Tobacco Use  . Smoking status: Never Smoker  . Smokeless tobacco: Never Used  Vaping Use  . Vaping Use: Never used  Substance and Sexual Activity  . Alcohol use: No  . Drug use: No  . Sexual activity: Not on file  Other Topics Concern  . Not on file  Social History Narrative  . Not on file   Social Determinants of Health   Financial Resource Strain:   . Difficulty of Paying Living  Expenses: Not on file  Food Insecurity:   . Worried About Charity fundraiser in the Last Year: Not on file  . Ran Out of Food in the Last Year: Not on file  Transportation Needs:   . Lack of Transportation (Medical): Not on file  . Lack of Transportation (Non-Medical): Not on file  Physical Activity:   . Days of Exercise per Week: Not on file  . Minutes of Exercise per Session: Not on file  Stress:   . Feeling of Stress : Not on file  Social Connections:   . Frequency of Communication with Friends and Family: Not on file  . Frequency of Social Gatherings with Friends and Family: Not on file  . Attends Religious Services: Not on file  . Active Member of Clubs or Organizations: Not on file  . Attends Archivist Meetings: Not on file  . Marital Status: Not on file  Intimate Partner Violence:   . Fear of Current or Ex-Partner: Not on file  . Emotionally Abused: Not on file  . Physically Abused: Not on file  . Sexually Abused: Not on file    Review of Systems: 12 system ROS is negative except as noted above.   Physical Exam: General:   Alert,  well-nourished, pleasant and cooperative in NAD Head:  Normocephalic and atraumatic. Eyes:  Sclera clear, no icterus.   Conjunctiva pink. Ears:  Normal auditory acuity. Nose:  No deformity, discharge,  or lesions. Mouth:  No deformity or lesions.   Neck:  Supple; no masses or thyromegaly. Lungs:  Clear throughout to auscultation.   No wheezes. Heart:  Regular rate and rhythm; no murmurs. Abdomen:  Soft,nontender, nondistended, normal bowel sounds, no rebound or guarding. No hepatosplenomegaly.   Rectal:  Deferred  Msk:  Symmetrical. No boney deformities LAD: No inguinal or umbilical LAD Extremities:  No clubbing or edema. Neurologic:  Alert and  oriented x4;  grossly nonfocal Skin:  Intact without significant lesions or rashes. Psych:  Alert and cooperative. Normal mood and affect.    Paddy Walthall L. Tarri Glenn, MD,  MPH 04/05/2020, 9:32 AM

## 2020-04-06 ENCOUNTER — Other Ambulatory Visit: Payer: Self-pay

## 2020-04-06 ENCOUNTER — Other Ambulatory Visit: Payer: Self-pay | Admitting: Gastroenterology

## 2020-04-06 ENCOUNTER — Encounter: Payer: Self-pay | Admitting: Gastroenterology

## 2020-04-06 ENCOUNTER — Ambulatory Visit (AMBULATORY_SURGERY_CENTER): Payer: Managed Care, Other (non HMO) | Admitting: Gastroenterology

## 2020-04-06 VITALS — BP 122/77 | HR 69 | Temp 97.1°F | Resp 17 | Ht 64.0 in | Wt 212.0 lb

## 2020-04-06 DIAGNOSIS — Z860100 Personal history of colon polyps, unspecified: Secondary | ICD-10-CM

## 2020-04-06 DIAGNOSIS — K635 Polyp of colon: Secondary | ICD-10-CM

## 2020-04-06 DIAGNOSIS — K259 Gastric ulcer, unspecified as acute or chronic, without hemorrhage or perforation: Secondary | ICD-10-CM

## 2020-04-06 DIAGNOSIS — K317 Polyp of stomach and duodenum: Secondary | ICD-10-CM

## 2020-04-06 DIAGNOSIS — R140 Abdominal distension (gaseous): Secondary | ICD-10-CM

## 2020-04-06 DIAGNOSIS — Z8719 Personal history of other diseases of the digestive system: Secondary | ICD-10-CM

## 2020-04-06 DIAGNOSIS — K319 Disease of stomach and duodenum, unspecified: Secondary | ICD-10-CM

## 2020-04-06 DIAGNOSIS — K2980 Duodenitis without bleeding: Secondary | ICD-10-CM | POA: Diagnosis not present

## 2020-04-06 DIAGNOSIS — R970 Elevated carcinoembryonic antigen [CEA]: Secondary | ICD-10-CM

## 2020-04-06 DIAGNOSIS — D124 Benign neoplasm of descending colon: Secondary | ICD-10-CM

## 2020-04-06 HISTORY — DX: Personal history of colon polyps, unspecified: Z86.0100

## 2020-04-06 HISTORY — DX: Personal history of other diseases of the digestive system: Z87.19

## 2020-04-06 HISTORY — DX: Personal history of colonic polyps: Z86.010

## 2020-04-06 MED ORDER — SODIUM CHLORIDE 0.9 % IV SOLN: 500.0000 mL | Freq: Once | INTRAVENOUS | Status: DC

## 2020-04-06 MED ORDER — PANTOPRAZOLE SODIUM 40 MG PO TBEC: 40.0000 mg | DELAYED_RELEASE_TABLET | Freq: Two times a day (BID) | ORAL | 3 refills | Status: AC

## 2020-04-06 NOTE — Progress Notes (Signed)
Called to room to assist during endoscopic procedure.  Patient ID and intended procedure confirmed with present staff. Received instructions for my participation in the procedure from the performing physician.  

## 2020-04-06 NOTE — Patient Instructions (Signed)
Handouts given:  Hemorrhoids, Polyps Resume previous diet  continue present medications Start pantoprazole 40mg  BID for 10 weeks No aspirin, ibuprofen, naproxen, aleve, MObic or any other NSAIDS Await pathology results  YOU HAD AN ENDOSCOPIC PROCEDURE TODAY AT [facility]:   Refer to the procedure report that was given to you for any specific questions about what was found during the examination.  If the procedure report does not answer your questions, please call your gastroenterologist to clarify.  If you requested that your care partner not be given the details of your procedure findings, then the procedure report has been included in a sealed envelope for you to review at your convenience later.  YOU SHOULD EXPECT: Some feelings of bloating in the abdomen. Passage of more gas than usual.  Walking can help get rid of the air that was put into your GI tract during the procedure and reduce the bloating. If you had a lower endoscopy (such as a colonoscopy or flexible sigmoidoscopy) you may notice spotting of blood in your stool or on the toilet paper. If you underwent a bowel prep for your procedure, you may not have a normal bowel movement for a few days.  Please Note:  You might notice some irritation and congestion in your nose or some drainage.  This is from the oxygen used during your procedure.  There is no need for concern and it should clear up in a day or so.  SYMPTOMS TO REPORT IMMEDIATELY:   Following lower endoscopy (colonoscopy or flexible sigmoidoscopy):  Excessive amounts of blood in the stool  Significant tenderness or worsening of abdominal pains  Swelling of the abdomen that is new, acute  Fever of 100F or higher   Following upper endoscopy (EGD)  Vomiting of blood or coffee ground material  New chest pain or pain under the shoulder blades  Painful or persistently difficult swallowing  New shortness of breath  Fever of 100F or higher  Black,  tarry-looking stools  For urgent or emergent issues, a gastroenterologist can be reached at any hour by calling [phone]. Do not use MyChart messaging for urgent concerns.    DIET:  We do recommend a small meal at first, but then you may proceed to your regular diet.  Drink plenty of fluids but you should avoid alcoholic beverages for 24 hours.  ACTIVITY:  You should plan to take it easy for the rest of today and you should NOT DRIVE or use heavy machinery until tomorrow (because of the sedation medicines used during the test).    FOLLOW UP: Our staff will call the number listed on your records 48-72 hours following your procedure to check on you and address any questions or concerns that you may have regarding the information given to you following your procedure. If we do not reach you, we will leave a message.  We will attempt to reach you two times.  During this call, we will ask if you have developed any symptoms of COVID 19. If you develop any symptoms (ie: fever, flu-like symptoms, shortness of breath, cough etc.) before then, please call [phone].  If you test positive for Covid 19 in the 2 weeks post procedure, please call and report this information to [place].    If any biopsies were taken you will be contacted by phone or by letter within the next 1-3 weeks.  Please call us at [phone] if you have not heard about the biopsies in 3 weeks.  SIGNATURES/CONFIDENTIALITY: You and/or your care partner have signed paperwork which will be entered into your electronic medical record.  These signatures attest to the fact that that the information above on your After Visit Summary has been reviewed and is understood.  Full responsibility of the confidentiality of this discharge information lies with you and/or your care-partner.

## 2020-04-06 NOTE — Progress Notes (Signed)
Lidocaine buffer  robinol antisialogogue 

## 2020-04-06 NOTE — Progress Notes (Signed)
A and O x3. Report to RN. Tolerated MAC anesthesia well.Teeth unchanged after procedure.

## 2020-04-06 NOTE — Op Note (Signed)
[name] Procedure Date: 04/06/2020 10:03 AM MRN: [id number] Endoscopist: Thornton Park MD, MD Age: 64 Referring MD:  Date of Birth: [birth year] Gender: Female Account #: [id number] Procedure:                Colonoscopy Indications:              Screening for colorectal malignant neoplasm                           Colonoscopy with Dr. Collene Mares 2010 - she remembers                            polyps being removed                           No known family history of colon cancer or polyps                           Elevated CEA Medicines:                Monitored Anesthesia Care Procedure:                Pre-Anesthesia Assessment:                           - Prior to the procedure, a History and Physical                            was performed, and patient medications and                            allergies were reviewed. The patient's tolerance of                            previous anesthesia was also reviewed. The risks                            and benefits of the procedure and the sedation                            options and risks were discussed with the patient.                            All questions were answered, and informed consent                            was obtained. Prior Anticoagulants: The patient has                            taken no previous anticoagulant or antiplatelet                            agents. ASA Grade Assessment: II - A patient with  mild systemic disease. After reviewing the risks                            and benefits, the patient was deemed in                            satisfactory condition to undergo the procedure.                           After obtaining informed consent, the colonoscope                            was passed under direct vision. Throughout the                            procedure, the patient's blood pressure, pulse, and                             oxygen saturations were monitored continuously. The                            Colonoscope was introduced through the anus and                            advanced to the 3 cm into the ileum. The                            colonoscopy was performed without difficulty. The                            patient tolerated the procedure well. The quality                            of the bowel preparation was good. The terminal                            ileum, ileocecal valve, appendiceal orifice, and                            rectum were photographed. Scope In: 10:35:12 AM Scope Out: 10:50:31 AM Scope Withdrawal Time: 0 hours 13 minutes 32 seconds  Total Procedure Duration: 0 hours 15 minutes 19 seconds  Findings:                 The perianal and digital rectal examinations were                            normal.                           Non-bleeding internal hemorrhoids were found. The                            hemorrhoids were small.  A 4 mm polyp was found in the descending colon. The                            polyp was flat. The polyp was removed with a cold                            snare. Resection and retrieval were complete.                            Estimated blood loss was minimal.                           The exam was otherwise without abnormality on                            direct and retroflexion views. Complications:            No immediate complications. Estimated blood loss:                            Minimal. Estimated Blood Loss:     Estimated blood loss was minimal. Impression:               - Non-bleeding internal hemorrhoids.                           - One 4 mm polyp in the descending colon, removed                            with a cold snare. Resected and retrieved.                           - The examination was otherwise normal on direct                            and retroflexion views. Recommendation:           - Patient has a  contact number available for                            emergencies. The signs and symptoms of potential                            delayed complications were discussed with the                            patient. Return to normal activities tomorrow.                            Written discharge instructions were provided to the                            patient.                           - Resume previous diet.                           -  Continue present medications.                           - Await pathology results.                           - Repeat colonoscopy date to be determined after                            pending pathology results are reviewed for                            surveillance.                           - Emerging evidence supports eating a diet of                            fruits, vegetables, grains, calcium, and yogurt                            while reducing red meat and alcohol may reduce the                            risk of colon cancer. Thornton Park MD, MD 04/06/2020 11:05:18 AM This report has been signed electronically.

## 2020-04-06 NOTE — Op Note (Signed)
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 The patient's tolerance of                            previous anesthesia was also reviewed. The risks                            and benefits of the procedure and the sedation                            options and risks were discussed with the patient.                            All questions were answered, and informed consent                            was obtained. Prior Anticoagulants: The patient has                            taken no previous anticoagulant or antiplatelet                            agents. ASA Grade Assessment: II - A patient with  mild systemic disease. After reviewing the risks                            and benefits, the patient was deemed in                            satisfactory condition to undergo the procedure.                           After obtaining informed consent, the endoscope was                            passed under direct vision. Throughout the                            procedure, the patient's blood pressure, pulse, and                            oxygen  saturations were monitored continuously. The                            Endoscope was introduced through the mouth, and                            advanced to the third part of duodenum. The upper                            GI endoscopy was accomplished without difficulty.                            The patient tolerated the procedure well. Scope In: Scope Out: Findings:                 The examined esophagus was normal.                           A few small sessile polyps were found in the                            gastric fundus and in the gastric body. They have                            the appearance of benign fundic gland polyps. The                            polyp was biopsied with a cold biopsy forceps.                            Estimated blood loss was minimal.                           Two non-bleeding cratered gastric ulcers with  pigmented material were found on the lesser                            curvature of the stomach. The largest lesion was 5                            mm in largest dimension. Biopsies were taken with a                            cold forceps for histology. Estimated blood loss                            was minimal.                           A single <1 mm sessile polyp/nodule was found in                            the gastric body. The polyp was removed with a cold                            biopsy forceps. Resection and retrieval were                            complete. Estimated blood loss was minimal.                           The entire examined stomach was otherwise normal.                            Biopsies were taken from the antrum, body, and                            fundus with a cold forceps for histology. Estimated                            blood loss was minimal.                           Patchy mildly erythematous mucosa without active                            bleeding and with no stigmata of bleeding  was found                            in the duodenal bulb. Biopsies were taken with a                            cold forceps for histology. Estimated blood loss                            was minimal.  The cardia and gastric fundus were normal on                            retroflexion.                           The exam was otherwise without abnormality. Complications:            No immediate complications. Estimated blood loss:                            Minimal. Estimated Blood Loss:     Estimated blood loss was minimal. Impression:               - Normal esophagus.                           - A few gastric polyps. 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                           - Non-bleeding gastric ulcers with pigmented                            material. Biopsied.                           - A single gastric polyp/nodule. Resected and                            retrieved.                           - Erythematous duodenopathy. Biopsied.                           - The examination was otherwise normal. Recommendation:           - Patient has a contact number available for                            emergencies. The signs and symptoms of potential                            delayed complications were discussed with the                            patient. Return to normal activities tomorrow.                            Written discharge instructions were provided to the                            patient.                           - Resume previous  diet.                           - Continue present medications.                           - Start pantoprazole 40 mg BID x 10 weeks.                           - Await pathology results.                           - No aspirin, ibuprofen, naproxen, or other                            non-steroidal anti-inflammatory drugs, including                            Mobic if you are able. Thornton Park MD, MD 04/06/2020 11:01:39 AM This report has been signed electronically.

## 2020-04-06 NOTE — Progress Notes (Signed)
Pt's states no medical or surgical changes since previsit or office visit. 

## 2020-04-08 ENCOUNTER — Telehealth: Payer: Self-pay | Admitting: *Deleted

## 2020-04-08 NOTE — Telephone Encounter (Signed)
  Follow up Call-  Call back number 04/06/2020  Post procedure Call Back phone  # [phone]  Permission to leave phone message Yes  Some recent data might be hidden     Patient questions:  Do you have a fever, pain , or abdominal swelling? No. Pain Score  0 *  Have you tolerated food without any problems? Yes.    Have you been able to return to your normal activities? Yes.    Do you have any questions about your discharge instructions: Diet   No. Medications  No. Follow up visit  No.  Do you have questions or concerns about your Care? No.  Actions: * If pain score is 4 or above: No action needed, pain <4.  1. Have you developed a fever since your procedure? no  2.   Have you had an respiratory symptoms (SOB or cough) since your procedure? no  3.   Have you tested positive for COVID 19 since your procedure no  4.   Have you had any family members/close contacts diagnosed with the COVID 19 since your procedure?  no   If yes to any of these questions please route to Joylene John, RN and Joella Prince, RN

## 2020-04-12 ENCOUNTER — Encounter (HOSPITAL_COMMUNITY): Payer: Managed Care, Other (non HMO)

## 2020-04-19 ENCOUNTER — Inpatient Hospital Stay: Payer: Managed Care, Other (non HMO) | Admitting: Hematology and Oncology

## 2020-04-19 ENCOUNTER — Inpatient Hospital Stay: Payer: Managed Care, Other (non HMO)

## 2020-04-19 ENCOUNTER — Encounter: Payer: Self-pay | Admitting: Hematology and Oncology

## 2020-04-19 ENCOUNTER — Encounter (HOSPITAL_COMMUNITY)
Admission: RE | Admit: 2020-04-19 | Discharge: 2020-04-19 | Disposition: A | Discharge status: Home / Self Care | Payer: Managed Care, Other (non HMO) | Source: Ambulatory Visit | Attending: Gynecologic Oncology | Admitting: Gynecologic Oncology

## 2020-04-19 ENCOUNTER — Other Ambulatory Visit: Payer: Self-pay

## 2020-04-19 ENCOUNTER — Other Ambulatory Visit: Payer: Self-pay | Admitting: Hematology and Oncology

## 2020-04-19 VITALS — BP 165/81 | HR 73 | Temp 98.7°F | Resp 18 | Ht 64.0 in | Wt 211.6 lb

## 2020-04-19 DIAGNOSIS — C539 Malignant neoplasm of cervix uteri, unspecified: Secondary | ICD-10-CM | POA: Diagnosis present

## 2020-04-19 DIAGNOSIS — R918 Other nonspecific abnormal finding of lung field: Secondary | ICD-10-CM | POA: Insufficient documentation

## 2020-04-19 DIAGNOSIS — Z23 Encounter for immunization: Secondary | ICD-10-CM

## 2020-04-19 DIAGNOSIS — Z79899 Other long term (current) drug therapy: Secondary | ICD-10-CM | POA: Insufficient documentation

## 2020-04-19 DIAGNOSIS — R030 Elevated blood-pressure reading, without diagnosis of hypertension: Secondary | ICD-10-CM | POA: Insufficient documentation

## 2020-04-19 DIAGNOSIS — N1330 Unspecified hydronephrosis: Secondary | ICD-10-CM

## 2020-04-19 DIAGNOSIS — M329 Systemic lupus erythematosus, unspecified: Secondary | ICD-10-CM

## 2020-04-19 DIAGNOSIS — C7951 Secondary malignant neoplasm of bone: Secondary | ICD-10-CM | POA: Insufficient documentation

## 2020-04-19 DIAGNOSIS — Z510 Encounter for antineoplastic radiation therapy: Secondary | ICD-10-CM | POA: Diagnosis not present

## 2020-04-19 DIAGNOSIS — Z5111 Encounter for antineoplastic chemotherapy: Secondary | ICD-10-CM | POA: Diagnosis not present

## 2020-04-19 DIAGNOSIS — C530 Malignant neoplasm of endocervix: Secondary | ICD-10-CM | POA: Insufficient documentation

## 2020-04-19 DIAGNOSIS — C169 Malignant neoplasm of stomach, unspecified: Secondary | ICD-10-CM | POA: Diagnosis present

## 2020-04-19 DIAGNOSIS — F064 Anxiety disorder due to known physiological condition: Secondary | ICD-10-CM | POA: Insufficient documentation

## 2020-04-19 DIAGNOSIS — C775 Secondary and unspecified malignant neoplasm of intrapelvic lymph nodes: Secondary | ICD-10-CM | POA: Insufficient documentation

## 2020-04-19 DIAGNOSIS — Z299 Encounter for prophylactic measures, unspecified: Secondary | ICD-10-CM

## 2020-04-19 LAB — CBC WITH DIFFERENTIAL/PLATELET
Abs Immature Granulocytes: 0.02 10*3/uL (ref 0.00–0.07)
Basophils Absolute: 0 10*3/uL (ref 0.0–0.1)
Basophils Relative: 1 %
Eosinophils Absolute: 0.1 10*3/uL (ref 0.0–0.5)
Eosinophils Relative: 2 %
HCT: 35.9 % — ABNORMAL LOW (ref 36.0–46.0)
Hemoglobin: 11.7 g/dL — ABNORMAL LOW (ref 12.0–15.0)
Immature Granulocytes: 0 %
Lymphocytes Relative: 31 %
Lymphs Abs: 2.3 10*3/uL (ref 0.7–4.0)
MCH: 28.3 pg (ref 26.0–34.0)
MCHC: 32.6 g/dL (ref 30.0–36.0)
MCV: 86.7 fL (ref 80.0–100.0)
Monocytes Absolute: 0.6 10*3/uL (ref 0.1–1.0)
Monocytes Relative: 9 %
Neutro Abs: 4.2 10*3/uL (ref 1.7–7.7)
Neutrophils Relative %: 57 %
Platelets: 267 10*3/uL (ref 150–400)
RBC: 4.14 MIL/uL (ref 3.87–5.11)
RDW: 14.1 % (ref 11.5–15.5)
WBC: 7.3 10*3/uL (ref 4.0–10.5)
nRBC: 0 % (ref 0.0–0.2)

## 2020-04-19 LAB — COMPREHENSIVE METABOLIC PANEL
ALT: 15 U/L (ref 0–44)
AST: 14 U/L — ABNORMAL LOW (ref 15–41)
Albumin: 4.2 g/dL (ref 3.5–5.0)
Alkaline Phosphatase: 46 U/L (ref 38–126)
Anion gap: 7 (ref 5–15)
BUN: 23 mg/dL (ref 8–23)
CO2: 29 mmol/L (ref 22–32)
Calcium: 10.6 mg/dL — ABNORMAL HIGH (ref 8.9–10.3)
Chloride: 106 mmol/L (ref 98–111)
Creatinine, Ser: 1.31 mg/dL — ABNORMAL HIGH (ref 0.44–1.00)
GFR, Estimated: 46 mL/min — ABNORMAL LOW (ref >60)
Glucose, Bld: 83 mg/dL (ref 70–99)
Potassium: 4 mmol/L (ref 3.5–5.1)
Sodium: 142 mmol/L (ref 135–145)
Total Bilirubin: 0.3 mg/dL (ref 0.3–1.2)
Total Protein: 7.7 g/dL (ref 6.5–8.1)

## 2020-04-19 LAB — GLUCOSE, CAPILLARY: Glucose-Capillary: 99 mg/dL (ref 70–99)

## 2020-04-19 LAB — MAGNESIUM: Magnesium: 2.1 mg/dL (ref 1.7–2.4)

## 2020-04-19 MED ORDER — LIDOCAINE-PRILOCAINE 2.5-2.5 % EX CREA: TOPICAL_CREAM | CUTANEOUS | 3 refills | Status: DC

## 2020-04-19 MED ORDER — ONDANSETRON HCL 8 MG PO TABS: 8.0000 mg | ORAL_TABLET | Freq: Three times a day (TID) | ORAL | 1 refills | Status: DC | PRN

## 2020-04-19 MED ORDER — FLUDEOXYGLUCOSE F - 18 (FDG) INJECTION
10.5500 | Freq: Once | INTRAVENOUS | Status: AC
  Administered 2020-04-19: 10.55 via INTRAVENOUS

## 2020-04-19 MED ORDER — PROCHLORPERAZINE MALEATE 10 MG PO TABS: 10.0000 mg | ORAL_TABLET | Freq: Four times a day (QID) | ORAL | 1 refills | Status: DC | PRN

## 2020-04-19 MED FILL — PROCHLORPERAZINE 10 MG TAB: 10 | 7 days supply | Qty: 30 | Fill #0

## 2020-04-19 MED FILL — ONDANSETRON HCL 8 MG TABLET: 8 | 10 days supply | Qty: 30 | Fill #0

## 2020-04-19 MED FILL — LIDOCAINE-PRILOCAINE CREAM: 2.5-2.5 | 10 days supply | Qty: 30 | Fill #0

## 2020-04-19 NOTE — Progress Notes (Signed)
START OFF PATHWAY REGIMEN - Other   [id number]:Cisplatin 35 mg/m2 q7 Days + RT:   A cycle is every 7 days:     Cisplatin   **Always confirm dose/schedule in your pharmacy ordering system**  Patient Characteristics: Intent of Therapy: Non-Curative / Palliative Intent, Discussed with Patient

## 2020-04-19 NOTE — Progress Notes (Signed)
   Covid-19 Vaccination Clinic  Name:  JORDI KAMM    MRN: [id number] DOB: May 29, 1956  04/19/2020  Ms. Hinson was observed post Covid-19 immunization for 15 minutes without incident. She was provided with Vaccine Information Sheet and instruction to access the V-Safe system.   Ms. Mifsud was instructed to call 911 with any severe reactions post vaccine: Marland Kitchen Difficulty breathing  . Swelling of face and throat  . A fast heartbeat  . A bad rash all over body  . Dizziness and weakness

## 2020-04-20 ENCOUNTER — Telehealth: Payer: Self-pay

## 2020-04-20 ENCOUNTER — Telehealth: Payer: Self-pay | Admitting: Hematology and Oncology

## 2020-04-20 DIAGNOSIS — M329 Systemic lupus erythematosus, unspecified: Secondary | ICD-10-CM | POA: Insufficient documentation

## 2020-04-20 DIAGNOSIS — Z299 Encounter for prophylactic measures, unspecified: Secondary | ICD-10-CM | POA: Insufficient documentation

## 2020-04-20 DIAGNOSIS — N1330 Unspecified hydronephrosis: Secondary | ICD-10-CM | POA: Insufficient documentation

## 2020-04-20 NOTE — Assessment & Plan Note (Signed)
She is not symptomatic from bone lesion Observe closely I recommend dental clearance and will prescribe reduced dose Zometa in the future

## 2020-04-20 NOTE — Assessment & Plan Note (Signed)
We discussed the importance of preventive care and reviewed the vaccination programs. She does not have any prior allergic reactions to Covid-19 vaccination. She agrees to proceed with booster dose of Covid-19 vaccination today and we will administer it today at the clinic.  

## 2020-04-20 NOTE — Telephone Encounter (Signed)
Called per Dr. Alvy Bimler. First treatment will be scheduled at [facility] on [facility]/12. No available time for appt at [facility] for the first treatment.  Renal Function is stable at this time and at this time Dr. Alvy Bimler does not think that she needs a stent. Dr. Alvy Bimler would like to give her IV Zometa to strengthen her bones. Please contact your dentist for dental clearance. She verbalized understanding to the above and will contact her dentist for clearance.  FYI

## 2020-04-20 NOTE — Telephone Encounter (Signed)
Scheduled appointment per 11/1 sch msg. Spoke to patient who is aware of appointment dates and times. Patient is aware infusion will be in [facility]. Okay per MD as well. Scheduled per secure chat with Donita Brooks.

## 2020-04-20 NOTE — Assessment & Plan Note (Signed)
Serum creatinine is mildly elevated from baseline I recommend close monitoring and plan to start her on treatment as soon as possible For now, I do not feel strongly she needs to see urologist for stent placement

## 2020-04-20 NOTE — Progress Notes (Signed)
Phyllis Hebert  Patient Care Team: [name], MD as PCP - General (Family Medicine) [name], MD as Consulting Physician (Obstetrics and Gynecology) [name], RN as Oncology Nurse Navigator (Oncology)  ASSESSMENT & PLAN:  Cervical cancer, FIGO stage IVB [facility]) I have reviewed her EGD report, colonoscopy report, cervical biopsy and review imaging study with the patient and her husband I also discussed the case with Dr. Denman George over the phone Overall, we felt that this is consistent with metastatic cervical cancer to the bone and regional lymph node. There are 2 lung nodules are nonspecific and not diagnostic for metastatic disease Even at stage IV disease, majority of the disease is centralized around her pelvis Potentially, she can benefit from radiation treatment although she is made aware upfront with stage IV disease, treatment intent is palliative and not curative She is young with some comorbidities She has appointments pending to see radiation oncologist on Wednesday We will get her case presented at the next GYN oncology tumor board next week I recommend chemo education class, port placement and to start concurrent chemoradiation therapy with cisplatin over the next few weeks. When she has completed chemoradiation portion of the treatment, she would not begin aggressive palliative chemotherapy I will try to coordinate night and expedite everything for her to start treatment by next week I will see her next week for further chemotherapy consent Plan reduced dose cisplatin  Metastasis to bone [facility]) She is not symptomatic from bone lesion Observe closely I recommend dental clearance and will prescribe reduced dose Zometa in the future  Hydronephrosis of left kidney Serum creatinine is mildly elevated from baseline I recommend close monitoring and plan to start her on treatment as soon as possible For now, I do not feel strongly she needs  to see urologist for stent placement  Preventive measure We discussed the importance of preventive care and reviewed the vaccination programs. She does not have any prior allergic reactions to Covid-19 vaccination. She agrees to proceed with booster dose of Covid-19 vaccination today and we will administer it today at the clinic.    Orders Placed This Encounter  Procedures  . IR IMAGING GUIDED PORT INSERTION    Standing Status:   Future    Standing Expiration Date:   04/19/2021    Order Specific Question:   Reason for Exam (SYMPTOM  OR DIAGNOSIS REQUIRED)    Answer:   need port for chemo to start 11/12    Order Specific Question:   Preferred Imaging Location?    Answer:   [facility]  . Comprehensive metabolic panel    Standing Status:   Standing    Number of Occurrences:   33    Standing Expiration Date:   04/19/2021  . CBC with Differential/Platelet    Standing Status:   Standing    Number of Occurrences:   22    Standing Expiration Date:   04/19/2021  . Magnesium    Standing Status:   Standing    Number of Occurrences:   22    Standing Expiration Date:   04/19/2021    The total time spent in the appointment was 90 minutes encounter with patients including review of chart and various tests results, discussions about plan of care and coordination of care plan   All questions were answered. The patient knows to call the clinic with any problems, questions or concerns. No barriers to learning was detected.  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 female is here because of recent diagnosis of cancer. She is here accompanied by her husband, Phyllis Hebert She is retired with 2 daughters She presented initially with postmenopausal bleeding, bloating and nonspecific achiness She  was seen by GYN and had biopsy that came back positive for malignancy Due to the unusual cancer cell type, she underwent EGD and colonoscopy which came back negative as source of disease  I have reviewed her chart and materials related to her cancer extensively and collaborated history with the patient. Summary of oncologic history is as follows: Oncology History  Cervical cancer, FIGO stage IVB ([place])  03/16/2020 Initial Diagnosis   The patient had history of new onset vaginal spotting on March 16, 2020.  She immediately called her OB/GYN's office and achieved an appointment with her OB/GYN's partner, Dr. Murrell Redden, who saw and evaluated the patient on March 19, 2020.  At that time a transvaginal ultrasound scan was performed which revealed a uterus measuring 5.3 x 8.4 x 4.6 cm with an ill-defined endometrium.  There is a complex cyst on the left ovary measuring 2.6 cm with peripheral blood flow noted.  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  Immunostains were positive for CEA and CDX2, negative for p16, ER, vimentin, and PAX8.  The differential diagnosis included primary gastric type endocervical adenocarcinoma (though this typically stains positive for PAX8) or a metastasis from pancreaticobiliary or upper GI primary.   03/29/2020 Pathology Results   1. Surgical [P], duodenum - PEPTIC DUODENITIS. - NO DYSPLASIA OR MALIGNANCY. 2. Surgical [P], gastric antrum and gastric body - REACTIVE GASTROPATHY. [place] IS NEGATIVE FOR HELICOBACTER PYLORI. - NO INTESTINAL METAPLASIA, DYSPLASIA, OR MALIGNANCY. 3. Surgical [P], gastric nodule - MILD REACTIVE GASTROPATHY. - NO INTESTINAL METAPLASIA, DYSPLASIA, OR MALIGNANCY. 4. Surgical [P], stomach, lesser curve ulcers - REACTIVE GASTROPATHY WITH EROSIONS. - NO INTESTINAL METAPLASIA, DYSPLASIA, OR MALIGNANCY. 5. Surgical [P],  gastric polyps - HYPERPLASTIC POLYP. - NO INTESTINAL METAPLASIA, DYSPLASIA OR MALIGNANCY. 6. Surgical [P], colon, descending, polyp - HYPERPLASTIC POLYP. - NO DYSPLASIA OR MALIGNANCY.   04/06/2020 Procedure   EGD report  Normal esophagus. - A few gastric polyps. Suspected fundic gland polyps. - Non-bleeding gastric ulcers with pigmented material. Biopsied. - A single gastric polyp/nodule. Resected and retrieved. - Erythematous duodenopathy. Biopsied. - The examination was otherwise normal.   04/06/2020 Procedure   Colonoscopy report - Non-bleeding internal hemorrhoids. - One 4 mm polyp in the descending colon, removed with a cold snare. Resected and retrieved. - The examination was otherwise normal on direct and retroflexion views   04/19/2020 PET scan   1. Hypermetabolic uterine cervix mass compatible with known primary cervical malignancy, with hypermetabolism extending into the upper third of the vagina and extending throughout the uterine body. No frank extrauterine extension. 2. Mild to moderate left hydroureteronephrosis to the level of the left UVJ, concerning for tumor involvement of the left UVJ. 3. Hypermetabolic right common iliac nodal metastasis. 4. Hypermetabolic sclerotic right ischial bone metastasis. 5. Two indeterminate small scattered solid pulmonary nodules, below PET resolution, warranting attention on chest CT follow-up in 3 months. 6.  Aortic Atherosclerosis (ICD10-I70.0).   04/19/2020 Cancer Staging   Staging form: Cervix Uteri, AJCC Version 9 - Clinical stage from  04/19/2020: FIGO Stage IVB (cT3b, cN1a, cM1) - Signed by [name], MD on 04/19/2020   Metastasis to bone ([place])  04/19/2020 Initial Diagnosis   Metastasis to bone [facility])    She denies bone pain She has diagnosis of systemic lupus complicated by skin rash, occasional joint pain and fatigue She is seeing a rheumatologist and is taking Plaquenil and Mobic Mobic was discontinued recently when she  was noted to have elevated serum creatinine She complained of pelvic pressure, sensation of needing to go to urinate and discomfort Denies changes to her bowel habits She has lost 7 to 8 pounds Since her recent biopsy, she had minimal vaginal spotting-  MEDICAL HISTORY:  Past Medical History:  Diagnosis Date  . Cervical cancer ([place])   . Colon polyps   . Gastric ulcer   . Lupus ([place])     SURGICAL HISTORY: Past Surgical History:  Procedure Laterality Date  . catarac Left   . COLONOSCOPY    . ENDOMETRIAL ABLATION  2010  . ESOPHAGOGASTRODUODENOSCOPY      SOCIAL HISTORY: Social History   Socioeconomic History  . Marital status: Married    Spouse name: Phyllis Hebert  . Number of children: 2  . Years of education: Not on file  . Highest education level: Not on file  Occupational History  . Occupation: retired  Tobacco Use  . Smoking status: Never Smoker  . Smokeless tobacco: Never Used  Vaping Use  . Vaping Use: Never used  Substance and Sexual Activity  . Alcohol use: No  . Drug use: No  . Sexual activity: Not on file  Other Topics Concern  . Not on file  Social History Narrative  . Not on file   Social Determinants of Health   Financial Resource Strain:   . Difficulty of Paying Living Expenses: Not on file  Food Insecurity:   . Worried About Charity fundraiser in the Last Year: Not on file  . Ran Out of Food in the Last Year: Not on file  Transportation Needs:   . Lack of Transportation (Medical): Not on file  . Lack of Transportation (Non-Medical): Not on file  Physical Activity:   . Days of Exercise per Week: Not on file  . Minutes of Exercise per Session: Not on file  Stress:   . Feeling of Stress : Not on file  Social Connections:   . Frequency of Communication with Friends and Family: Not on file  . Frequency of Social Gatherings with Friends and Family: Not on file  . Attends Religious Services: Not on file  . Active Member of Clubs or Organizations: Not on  file  . Attends Archivist Meetings: Not on file  . Marital Status: Not on file  Intimate Partner Violence:   . Fear of Current or Ex-Partner: Not on file  . Emotionally Abused: Not on file  . Physically Abused: Not on file  . Sexually Abused: Not on file    FAMILY HISTORY: Family History  Problem Relation Age of Onset  . Hypertension Mother   . Emphysema Mother   . Uterine cancer Mother 16  . Hypertension Father   . Stroke Father   . Hypertension Brother   . Healthy Daughter     ALLERGIES:  is allergic to penicillins.  MEDICATIONS:  Current Outpatient Medications  Medication Sig Dispense Refill  . Cholecalciferol (VITAMIN D3) 2000 units TABS Take 2 tablets by mouth daily.    . clobetasol cream (TEMOVATE) 0.05 % Apply  topically. Left leg    . fenofibrate 160 MG tablet TAKE 1 TABLET BY MOUTH EVERY DAY 30 tablet 5  . hydroxychloroquine (PLAQUENIL) 200 MG tablet 2 TABLET WITH FOOD OR MILK ONCE A DAY ORALLY 30  2  . lidocaine-prilocaine (EMLA) cream Apply to affected area once 30 g 3  . ondansetron (ZOFRAN) 8 MG tablet Take 1 tablet (8 mg total) by mouth every 8 (eight) hours as needed. Start on the third day after cisplatin chemotherapy. 30 tablet 1  . pantoprazole (PROTONIX) 40 MG tablet Take 1 tablet (40 mg total) by mouth 2 (two) times daily. 90 tablet 3  . prochlorperazine (COMPAZINE) 10 MG tablet Take 1 tablet (10 mg total) by mouth every 6 (six) hours as needed (Nausea or vomiting). 30 tablet 1   No current facility-administered medications for this visit.    REVIEW OF SYSTEMS:   Constitutional: Denies fevers, chills or abnormal night sweats Eyes: Denies blurriness of vision, double vision or watery eyes Ears, nose, mouth, throat, and face: Denies mucositis or sore throat Respiratory: Denies cough, dyspnea or wheezes Cardiovascular: Denies palpitation, chest discomfort or lower extremity swelling Skin: Denies abnormal skin rashes Lymphatics: Denies new  lymphadenopathy or easy bruising Neurological:Denies numbness, tingling or new weaknesses Behavioral/Psych: Mood is stable, no new changes  All other systems were reviewed with the patient and are negative.  PHYSICAL EXAMINATION: ECOG PERFORMANCE STATUS: 1 - Symptomatic but completely ambulatory  Vitals:   04/19/20 1334  BP: (!) 165/81  Pulse: 73  Resp: 18  Temp: 98.7 F (37.1 C)  SpO2: 99%   Filed Weights   04/19/20 1334  Weight: 211 lb 9.6 oz (96 kg)    GENERAL:alert, no distress and comfortable SKIN: skin color, texture, turgor are normal, no rashes or significant lesions EYES: normal, conjunctiva are pink and non-injected, sclera clear OROPHARYNX:no exudate, no erythema and lips, buccal mucosa, and tongue normal  NECK: supple, thyroid normal size, non-tender, without nodularity LYMPH:  no palpable lymphadenopathy in the cervical, axillary or inguinal LUNGS: clear to auscultation and percussion with normal breathing effort HEART: regular rate & rhythm and no murmurs and no lower extremity edema ABDOMEN:abdomen soft, non-tender and normal bowel sounds Musculoskeletal:no cyanosis of digits and no clubbing  PSYCH: alert & oriented x 3 with fluent speech NEURO: no focal motor/sensory deficits  LABORATORY DATA:  I have reviewed the data as listed Lab Results  Component Value Date   WBC 7.3 04/19/2020   HGB 11.7 (L) 04/19/2020   HCT 35.9 (L) 04/19/2020   MCV 86.7 04/19/2020   PLT 267 04/19/2020   Recent Labs    04/01/20 0959 04/19/20 1522  NA 139 142  K 4.5 4.0  CL 106 106  CO2 29 29  GLUCOSE 90 83  BUN 24* 23  CREATININE 1.34* 1.31*  CALCIUM 10.0 10.6*  GFRNONAA 42* 46*  PROT  --  7.7  ALBUMIN  --  4.2  AST  --  14*  ALT  --  15  ALKPHOS  --  46  BILITOT  --  0.3    RADIOGRAPHIC STUDIES: I have reviewed multiple imaging studies with the patient I have personally reviewed the radiological images as listed and agreed with the findings in the report. NM  PET Image Initial (PI) Skull Base To Thigh  Result Date: 04/19/2020 CLINICAL DATA:  Initial treatment strategy for clinical stage IIB cervical cancer. 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 EXAM: NUCLEAR MEDICINE PET SKULL BASE TO THIGH TECHNIQUE: 10.6 mCi F-18  FDG was injected intravenously. Full-ring PET imaging was performed from the skull base to thigh after the radiotracer. CT data was obtained and used for attenuation correction and anatomic localization. Fasting blood glucose: 99 mg/dl COMPARISON:  None. FINDINGS: Mediastinal blood pool activity: SUV max 3.0 Liver activity: SUV max NA NECK: No hypermetabolic lymph nodes in the neck. Incidental CT findings: none CHEST: No enlarged or hypermetabolic axillary, mediastinal or hilar lymph nodes. No hypermetabolic pulmonary findings. Incidental CT findings: Subpleural 0.6 cm solid anterior right upper lobe pulmonary nodule, below PET resolution (series 8/image 14). Solid 0.4 cm superior segment left lower lobe pulmonary nodule (series 8/image 20), below PET resolution. ABDOMEN/PELVIS: Hypermetabolic uterine cervix mass with hypermetabolism extending into the upper third of the vagina and extending throughout the uterine body with max SUV 14.6, without frank extrauterine extension. Mildly enlarged 1.0 cm hypermetabolic right common iliac lymph node with max SUV 7.3 (series 4/image 152). No additional enlarged or hypermetabolic pelvic lymph nodes. No abnormal hypermetabolic activity within the liver, pancreas, adrenal glands, or spleen. No hypermetabolic lymph nodes in the abdomen. Incidental CT findings: Simple 3.0 cm left adnexal cyst (series 4/image 160). Mild to moderate left hydroureteronephrosis to the level of the left ureterovesical junction. Mildly atherosclerotic nonaneurysmal abdominal aorta. Simple 2.8 cm anterior left liver cyst. SKELETON: Hypermetabolic 1.3 cm sclerotic right ischial lesion with max SUV 9.5 (series 4/image 27). No additional  hypermetabolic osseous lesions. Incidental CT findings: none IMPRESSION: 1. Hypermetabolic uterine cervix mass compatible with known primary cervical malignancy, with hypermetabolism extending into the upper third of the vagina and extending throughout the uterine body. No frank extrauterine extension. 2. Mild to moderate left hydroureteronephrosis to the level of the left UVJ, concerning for tumor involvement of the left UVJ. 3. Hypermetabolic right common iliac nodal metastasis. 4. Hypermetabolic sclerotic right ischial bone metastasis. 5. Two indeterminate small scattered solid pulmonary nodules, below PET resolution, warranting attention on chest CT follow-up in 3 months. 6.  Aortic Atherosclerosis (ICD10-I70.0). Electronically Signed   By: Ilona Sorrel M.D.   On: 04/19/2020 11:15   MM 3D SCREEN BREAST BILATERAL  Result Date: 03/24/2020 CLINICAL DATA:  Screening. EXAM: DIGITAL SCREENING BILATERAL MAMMOGRAM WITH TOMO AND CAD COMPARISON:  Previous exam(s). ACR Breast Density Category c: The breast tissue is heterogeneously dense, which may obscure small masses. FINDINGS: There are no findings suspicious for malignancy. Images were processed with CAD. IMPRESSION: No mammographic evidence of malignancy. A result letter of this screening mammogram will be mailed directly to the patient. RECOMMENDATION: Screening mammogram in one year. (Code:SM-B-01Y) BI-RADS CATEGORY  1: Negative. Electronically Signed   By: Claudie Revering M.D.   On: 03/24/2020 17:13

## 2020-04-20 NOTE — Assessment & Plan Note (Addendum)
I have reviewed her EGD report, colonoscopy report, cervical biopsy and review imaging study with the patient and her husband I also discussed the case with Dr. Denman George over the phone Overall, we felt that this is consistent with metastatic cervical cancer to the bone and regional lymph node. There are 2 lung nodules are nonspecific and not diagnostic for metastatic disease Even at stage IV disease, majority of the disease is centralized around her pelvis Potentially, she can benefit from radiation treatment although she is made aware upfront with stage IV disease, treatment intent is palliative and not curative She is young with some comorbidities She has appointments pending to see radiation oncologist on Wednesday We will get her case presented at the next GYN oncology tumor board next week I recommend chemo education class, port placement and to start concurrent chemoradiation therapy with cisplatin over the next few weeks. When she has completed chemoradiation portion of the treatment, she would not begin aggressive palliative chemotherapy I will try to coordinate night and expedite everything for her to start treatment by next week I will see her next week for further chemotherapy consent Plan reduced dose cisplatin

## 2020-04-21 ENCOUNTER — Encounter: Payer: Self-pay | Admitting: Oncology

## 2020-04-21 ENCOUNTER — Ambulatory Visit
Admission: RE | Admit: 2020-04-21 | Discharge: 2020-04-21 | Disposition: A | Discharge status: Home / Self Care | Payer: Managed Care, Other (non HMO) | Source: Ambulatory Visit | Attending: Radiation Oncology | Admitting: Radiation Oncology

## 2020-04-21 ENCOUNTER — Inpatient Hospital Stay: Payer: Managed Care, Other (non HMO)

## 2020-04-21 ENCOUNTER — Telehealth: Payer: Self-pay | Admitting: Oncology

## 2020-04-21 ENCOUNTER — Other Ambulatory Visit: Payer: Self-pay

## 2020-04-21 ENCOUNTER — Encounter: Payer: Self-pay | Admitting: Radiation Oncology

## 2020-04-21 VITALS — BP 149/85 | HR 72 | Temp 98.2°F | Resp 18 | Ht 64.5 in | Wt 211.0 lb

## 2020-04-21 DIAGNOSIS — I700 Atherosclerosis of aorta: Secondary | ICD-10-CM | POA: Diagnosis not present

## 2020-04-21 DIAGNOSIS — Z8719 Personal history of other diseases of the digestive system: Secondary | ICD-10-CM | POA: Diagnosis not present

## 2020-04-21 DIAGNOSIS — C169 Malignant neoplasm of stomach, unspecified: Secondary | ICD-10-CM

## 2020-04-21 DIAGNOSIS — M329 Systemic lupus erythematosus, unspecified: Secondary | ICD-10-CM | POA: Diagnosis not present

## 2020-04-21 DIAGNOSIS — R918 Other nonspecific abnormal finding of lung field: Secondary | ICD-10-CM | POA: Diagnosis not present

## 2020-04-21 DIAGNOSIS — N1330 Unspecified hydronephrosis: Secondary | ICD-10-CM | POA: Insufficient documentation

## 2020-04-21 DIAGNOSIS — C539 Malignant neoplasm of cervix uteri, unspecified: Secondary | ICD-10-CM | POA: Insufficient documentation

## 2020-04-21 DIAGNOSIS — Z86010 Personal history of colonic polyps: Secondary | ICD-10-CM | POA: Insufficient documentation

## 2020-04-21 DIAGNOSIS — C7951 Secondary malignant neoplasm of bone: Secondary | ICD-10-CM

## 2020-04-21 DIAGNOSIS — Z79899 Other long term (current) drug therapy: Secondary | ICD-10-CM | POA: Insufficient documentation

## 2020-04-21 NOTE — Progress Notes (Signed)
Patient here for a consult with Dr. Sondra Come.  GYN Location of Tumor / Histology: Cervical  Phyllis Hebert presented with symptoms of: vaginal spotting 03/16/2020  Biopsies revealed: FIGO Stage IV B   Past/Anticipated interventions by Gyn/Onc surgery, if any: none recently   Past/Anticipated interventions by medical oncology, if any: starts chemo 11/12  Weight changes, if any: lost 7-8 lbs  Bowel/Bladder complaints, if any: bladder pressure  Nausea/Vomiting, if any: no  Pain issues, if any:  lower mid back 7 out of 10  SAFETY ISSUES:  Prior radiation? no  Pacemaker/ICD? no  Possible current pregnancy? Postmenopausal  Is the patient on methotrexate? no   Current Complaints / other details:  accompanied by husband  BP (!) 149/85 (BP Location: Right Arm, Patient Position: Sitting, Cuff Size: Normal)   Pulse 72   Temp 98.2 F (36.8 C)   Resp 18   Ht 5' 4.5" (1.638 m)   Wt 211 lb (95.7 kg)   SpO2 100%   BMI 35.66 kg/m    Wt Readings from Last 3 Encounters:  04/21/20 211 lb (95.7 kg)  04/19/20 211 lb 9.6 oz (96 kg)  04/06/20 212 lb (96.2 kg)

## 2020-04-21 NOTE — Progress Notes (Signed)
Radiation Oncology         [phone] ________________________________  Initial Outpatient Consultation  Name: Phyllis Hebert MRN: [id number]  Date: 04/21/2020  DOB: 11-06-1955  FG:[id number], Phyllis Millet, MD  [name], MD   REFERRING PHYSICIAN: [name], MD  DIAGNOSIS: The primary encounter diagnosis was Metastasis to bone [facility]). A diagnosis of Cervical cancer, FIGO stage IVB ([place]) was also pertinent to this visit.  Stage IVB (cT3b, cN1a, cM1) adenocarcinoma of the cervix  HISTORY OF PRESENT ILLNESS::Phyllis Hebert is a 64 y.o. female who is seen as a courtesy of Dr. Denman George for an opinion concerning radiation therapy as part of management for her recently diagnosed cervical cancer. Today, she is accompanied by her husband. The patient presented to Dr. Murrell Redden, OB-GYN, on 04/19/2020 with complaint of new onset postmenopausal bleeding. Transvaginal ultrasound performed at that time showed a uterus that measured 5.3 x 8.4 x 4.6 cm with an ill-defined endometrium. There was also noted to be a complex cyst on the left ovary that measured 2.6 cm with peripheral blood flow. Physical exam identified a cervical mass that was biopsied on that same day and revealed invasive adenocarcinoma involving the fibrous stroma. Given that immunostains were positive for CEA and CDX2, differential diagnoses included primary gastric type endocervical adenocarcinoma versus a metastasis from pancreaticobiliary or upper GI primary.   The patient was referred to Dr. Denman George and was seen in consultation on 04/01/2020. At that time, it was recommended that she proceed with further diagnostic work-up including a PET scan and evaluation from gastroenterology for consideration of EGD. If no primary upper gastrointestinal lesion was identified, then a presumptive diagnosis of a primary gastric type endocervical adenocarcinoma would be considered.  The patient was referred to Dr. Tarri Glenn and underwent an  EGD and colonoscopy on 04/06/2020. Pathology from the procedure did not reveal any dysplasia, malignancy, or intestinal metaplasia of the duodenum, gastric antrum, gastric body, gastrice nodule, lesser curve ulcers of the stomach, gastric polyps, and descending colon polyp.  PET scan on 04/19/2020 showed a hypermetabolic uterine cervix mass that was compatible with the known primary cervical malignancy, with hypermetabolism extending into the upper third of the vagina and extending throughout the uterine body. There was no frank extrauterine extension. However, there was noted to be mild to moderate left hydroureteronephrosis to the level of the left UVJ, concerning for tumor involvement of the left UVJ. Additionally, there was hypermetabolic sclerotic right ischial bone metastasis and two indeterminate small scattered slid pulmonary nodules that were below PET resolution but still warranted attention on follow-up chest CT scan in three months.   The patient was seen in consultation with Dr. Alvy Bimler on 04/19/2020, during which time it was recommended that she proceed with palliative treatment with Cisplatin.   PREVIOUS RADIATION THERAPY: No  PAST MEDICAL HISTORY:  Past Medical History:  Diagnosis Date  . Cervical cancer ([place])   . Colon polyps   . Gastric ulcer   . Lupus ([place])     PAST SURGICAL HISTORY: Past Surgical History:  Procedure Laterality Date  . catarac Left   . COLONOSCOPY    . ENDOMETRIAL ABLATION  2010  . ESOPHAGOGASTRODUODENOSCOPY      FAMILY HISTORY:  Family History  Problem Relation Age of Onset  . Hypertension Mother   . Emphysema Mother   . Uterine cancer Mother 46  . Hypertension Father   . Stroke Father   . Hypertension Brother   . Healthy Daughter  SOCIAL HISTORY:  Social History   Tobacco Use  . Smoking status: Never Smoker  . Smokeless tobacco: Never Used  Vaping Use  . Vaping Use: Never used  Substance Use Topics  . Alcohol use: No  . Drug  use: No    ALLERGIES:  Allergies  Allergen Reactions  . Penicillins Rash    MEDICATIONS:  Current Outpatient Medications  Medication Sig Dispense Refill  . Cholecalciferol (VITAMIN D3) 2000 units TABS Take 2 tablets by mouth daily.    . clobetasol cream (TEMOVATE) 0.05 % Apply topically. Left leg    . fenofibrate 160 MG tablet TAKE 1 TABLET BY MOUTH EVERY DAY 30 tablet 5  . hydroxychloroquine (PLAQUENIL) 200 MG tablet 2 TABLET WITH FOOD OR MILK ONCE A DAY ORALLY 30  2  . lidocaine-prilocaine (EMLA) cream Apply to affected area once 30 g 3  . ondansetron (ZOFRAN) 8 MG tablet Take 1 tablet (8 mg total) by mouth every 8 (eight) hours as needed. Start on the third day after cisplatin chemotherapy. 30 tablet 1  . pantoprazole (PROTONIX) 40 MG tablet Take 1 tablet (40 mg total) by mouth 2 (two) times daily. 90 tablet 3  . prochlorperazine (COMPAZINE) 10 MG tablet Take 1 tablet (10 mg total) by mouth every 6 (six) hours as needed (Nausea or vomiting). 30 tablet 1   No current facility-administered medications for this encounter.    REVIEW OF SYSTEMS:  A 10+ POINT REVIEW OF SYSTEMS WAS OBTAINED including neurology, dermatology, psychiatry, cardiac, respiratory, lymph, extremities, GI, GU, musculoskeletal, constitutional, reproductive, HEENT.  The patient denies any significant pelvic pain.  She has noticed some discomfort since her PET scan primarily in the upper buttocks/sacrum area.  She denies any significant vaginal bleeding at this time.   PHYSICAL EXAM:  height is 5' 4.5" (1.638 m) and weight is 211 lb (95.7 kg). Her temperature is 98.2 F (36.8 C). Her blood pressure is 149/85 (abnormal) and her pulse is 72. Her respiration is 18 and oxygen saturation is 100%.   General: Alert and oriented, in no acute distress HEENT: Head is normocephalic. Extraocular movements are intact. Oropharynx is clear. Neck: Neck is supple, no palpable cervical or supraclavicular lymphadenopathy. Heart: Regular  in rate and rhythm with no murmurs, rubs, or gallops. Chest: Clear to auscultation bilaterally, with no rhonchi, wheezes, or rales. Abdomen: Soft, nontender, nondistended, with no rigidity or guarding. Extremities: No cyanosis or edema. Lymphatics: see Neck Exam Skin: No concerning lesions. Musculoskeletal: symmetric strength and muscle tone throughout. Neurologic: Cranial nerves II through XII are grossly intact. No obvious focalities. Speech is fluent. Coordination is intact. Psychiatric: Judgment and insight are intact. Affect is appropriate. On pelvic exam the cervix is replaced by an approximately 4 to 5 cm hard mass.  This mass bleeds easily with examination.  On rectovaginal examination there appears to be bilateral parametrial involvement more so along the right side.  No obvious sidewall involvement  ECOG = 1  0 - Asymptomatic (Fully active, able to carry on all predisease activities without restriction)  1 - Symptomatic but completely ambulatory (Restricted in physically strenuous activity but ambulatory and able to carry out work of a light or sedentary nature. For example, light housework, office work)  2 - Symptomatic, <50% in bed during the day (Ambulatory and capable of all self care but unable to carry out any work activities. Up and about more than 50% of waking hours)  3 - Symptomatic, >50% in bed, but not bedbound (Capable of  only limited self-care, confined to bed or chair 50% or more of waking hours)  4 - Bedbound (Completely disabled. Cannot carry on any self-care. Totally confined to bed or chair)  5 - Death   Eustace Pen MM, Creech RH, Tormey DC, et al. [phone]). "Toxicity and response criteria of the [facility]". [place] Oncol. 5 (6): 649-55  LABORATORY DATA:  Lab Results  Component Value Date   WBC 7.3 04/19/2020   HGB 11.7 (L) 04/19/2020   HCT 35.9 (L) 04/19/2020   MCV 86.7 04/19/2020   PLT 267 04/19/2020   NEUTROABS 4.2 04/19/2020    Lab Results  Component Value Date   NA 142 04/19/2020   K 4.0 04/19/2020   CL 106 04/19/2020   CO2 29 04/19/2020   GLUCOSE 83 04/19/2020   CREATININE 1.31 (H) 04/19/2020   CALCIUM 10.6 (H) 04/19/2020      RADIOGRAPHY: NM PET Image Initial (PI) Skull Base To Thigh  Result Date: 04/19/2020 CLINICAL DATA:  Initial treatment strategy for clinical stage IIB cervical cancer. 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 EXAM: NUCLEAR MEDICINE PET SKULL BASE TO THIGH TECHNIQUE: 10.6 mCi F-18 FDG was injected intravenously. Full-ring PET imaging was performed from the skull base to thigh after the radiotracer. CT data was obtained and used for attenuation correction and anatomic localization. Fasting blood glucose: 99 mg/dl COMPARISON:  None. FINDINGS: Mediastinal blood pool activity: SUV max 3.0 Liver activity: SUV max NA NECK: No hypermetabolic lymph nodes in the neck. Incidental CT findings: none CHEST: No enlarged or hypermetabolic axillary, mediastinal or hilar lymph nodes. No hypermetabolic pulmonary findings. Incidental CT findings: Subpleural 0.6 cm solid anterior right upper lobe pulmonary nodule, below PET resolution (series 8/image 14). Solid 0.4 cm superior segment left lower lobe pulmonary nodule (series 8/image 20), below PET resolution. ABDOMEN/PELVIS: Hypermetabolic uterine cervix mass with hypermetabolism extending into the upper third of the vagina and extending throughout the uterine body with max SUV 14.6, without frank extrauterine extension. Mildly enlarged 1.0 cm hypermetabolic right common iliac lymph node with max SUV 7.3 (series 4/image 152). No additional enlarged or hypermetabolic pelvic lymph nodes. No abnormal hypermetabolic activity within the liver, pancreas, adrenal glands, or spleen. No hypermetabolic lymph nodes in the abdomen. Incidental CT findings: Simple 3.0 cm left adnexal cyst (series 4/image 160). Mild to moderate left hydroureteronephrosis to the level of the left  ureterovesical junction. Mildly atherosclerotic nonaneurysmal abdominal aorta. Simple 2.8 cm anterior left liver cyst. SKELETON: Hypermetabolic 1.3 cm sclerotic right ischial lesion with max SUV 9.5 (series 4/image 27). No additional hypermetabolic osseous lesions. Incidental CT findings: none IMPRESSION: 1. Hypermetabolic uterine cervix mass compatible with known primary cervical malignancy, with hypermetabolism extending into the upper third of the vagina and extending throughout the uterine body. No frank extrauterine extension. 2. Mild to moderate left hydroureteronephrosis to the level of the left UVJ, concerning for tumor involvement of the left UVJ. 3. Hypermetabolic right common iliac nodal metastasis. 4. Hypermetabolic sclerotic right ischial bone metastasis. 5. Two indeterminate small scattered solid pulmonary nodules, below PET resolution, warranting attention on chest CT follow-up in 3 months. 6.  Aortic Atherosclerosis (ICD10-I70.0). Electronically Signed   By: Ilona Sorrel M.D.   On: 04/19/2020 11:15   MM 3D SCREEN BREAST BILATERAL  Result Date: 03/24/2020 CLINICAL DATA:  Screening. EXAM: DIGITAL SCREENING BILATERAL MAMMOGRAM WITH TOMO AND CAD COMPARISON:  Previous exam(s). ACR Breast Density Category c: The breast tissue is heterogeneously dense, which may obscure small masses. FINDINGS: There are  no findings suspicious for malignancy. Images were processed with CAD. IMPRESSION: No mammographic evidence of malignancy. A result letter of this screening mammogram will be mailed directly to the patient. RECOMMENDATION: Screening mammogram in one year. (Code:[id number]-[id number]) BI-RADS CATEGORY  1: Negative. Electronically Signed   By: Claudie Revering M.D.   On: 03/24/2020 17:13      IMPRESSION: Stage IVB (cT3b, cN1a, cM1) adenocarcinoma of the cervix  The patient has an isolated metastatic deposit in the right ischial region.  I discussed this issue with Dr. Denman George and Dr. Alvy Bimler.  Given the patient's  excellent performance status and minimal metastatic disease we will proceed with an aggressive course of radiation including external beam radiation therapy directed at the pelvis which will encompass the sclerotic metastasis in the right pelvis region.  This will also be accompanied by radiosensitizing weekly cisplatin-based radiosensitizing chemotherapy.  Patient will then proceed with brachytherapy.  Her case will be discussed at the multidisciplinary oncologic oncology conference early next week for further input.  This will address the anticipated number of brachytherapy procedures do prior to proceeding with  full dose chemotherapy.  Today, I talked to the patient and her husband about the findings and work-up thus far.  We discussed the natural history of cervical cancer and general treatment, highlighting the role of radiotherapy in the management.  We discussed the available radiation techniques, and focused on the details of logistics and delivery.  We reviewed the anticipated acute and late sequelae associated with radiation in this setting.  The patient was encouraged to ask questions that I answered to the best of my ability.  A patient consent form was discussed and signed.  We retained a copy for our records.  The patient would like to proceed with radiation and will be scheduled for CT simulation.  PLAN: The patient will return on November 9 for CT simulation.  Treatments to begin the week of November 15.  Radiosensitizing chemotherapy will begin November 12.  Anticipate 6 weeks of external beam radiation therapy followed by high-dose-rate brachytherapy directed at the cervical region.  Total time spent in this encounter was 65 minutes which included reviewing the patient's most recent consultations, follow-ups, PET scan, biopsies, EGD/colonoscopy, pathology reports, physical examination, and documentation.    ------------------------------------------------  Blair Promise, PhD,  MD  This document serves as a record of services personally performed by Gery Pray, MD. It was created on his behalf by Clerance Lav, a trained medical scribe. The creation of this record is based on the scribe's personal observations and the provider's statements to them. This document has been checked and approved by the attending provider.

## 2020-04-21 NOTE — Telephone Encounter (Signed)
[facility] and advised her that it is ok to have a tooth pulled if needed 3 days before chemo starts per Dr. Alvy Bimler.  She verbalized understanding.

## 2020-04-22 NOTE — Progress Notes (Signed)
Pharmacist Chemotherapy Monitoring - Initial Assessment    Anticipated start date: 04/30/20  Regimen:  . Are orders appropriate based on the patient's diagnosis, regimen, and cycle? Yes . Does the plan date match the patient's scheduled date? Yes . Is the sequencing of drugs appropriate? Yes . Are the premedications appropriate for the patient's regimen? Yes . Prior Authorization for treatment is: Not Started o If applicable, is the correct biosimilar selected based on the patient's insurance? not applicable  Organ Function and Labs: Marland Kitchen Are dose adjustments needed based on the patient's renal function, hepatic function, or hematologic function? Yes . Are appropriate labs ordered prior to the start of patient's treatment? Yes . Other organ system assessment, if indicated: N/A . The following baseline labs, if indicated, have been ordered: cisplatin: K, Mg  Dose Assessment: . Are the drug doses appropriate? Yes . Are the following correct: o Drug concentrations Yes o IV fluid compatible with drug Yes o Administration routes Yes o Timing of therapy Yes . If applicable, does the patient have documented access for treatment and/or plans for port-a-cath placement? yes . If applicable, have lifetime cumulative doses been properly documented and assessed? not applicable Lifetime Dose Tracking  No doses have been documented on this patient for the following tracked chemicals: Doxorubicin, Epirubicin, Idarubicin, Daunorubicin, Mitoxantrone, Bleomycin, Oxaliplatin, Carboplatin, Liposomal Doxorubicin  o   Toxicity Monitoring/Prevention: . The patient has the following take home antiemetics prescribed: Ondansetron and Prochlorperazine . The patient has the following take home medications prescribed: N/A . Medication allergies and previous infusion related reactions, if applicable, have been reviewed and addressed. Yes . The patient's current medication list has been assessed for drug-drug  interactions with their chemotherapy regimen. no significant drug-drug interactions were identified on review.  Order Review: . Are the treatment plan orders signed? No . Is the patient scheduled to see a provider prior to their treatment? Yes  I verify that I have reviewed each item in the above checklist and answered each question accordingly.  Phyllis Hebert, Phyllis Hebert 04/22/2020 2:47 PM

## 2020-04-23 ENCOUNTER — Other Ambulatory Visit: Payer: Self-pay | Admitting: Radiology

## 2020-04-26 ENCOUNTER — Other Ambulatory Visit: Payer: Self-pay | Admitting: Oncology

## 2020-04-26 ENCOUNTER — Encounter (HOSPITAL_COMMUNITY): Payer: Self-pay

## 2020-04-26 ENCOUNTER — Encounter: Payer: Self-pay | Admitting: Oncology

## 2020-04-26 ENCOUNTER — Other Ambulatory Visit: Payer: Self-pay

## 2020-04-26 ENCOUNTER — Other Ambulatory Visit: Payer: Self-pay | Admitting: Radiology

## 2020-04-26 ENCOUNTER — Telehealth: Payer: Self-pay | Admitting: Oncology

## 2020-04-26 ENCOUNTER — Encounter: Payer: Self-pay | Admitting: Gastroenterology

## 2020-04-26 ENCOUNTER — Ambulatory Visit (HOSPITAL_COMMUNITY)
Admission: RE | Admit: 2020-04-26 | Discharge: 2020-04-26 | Disposition: A | Discharge status: Home / Self Care | Payer: Managed Care, Other (non HMO) | Source: Ambulatory Visit | Attending: Family Medicine | Admitting: Family Medicine

## 2020-04-26 ENCOUNTER — Ambulatory Visit (HOSPITAL_COMMUNITY)
Admission: RE | Admit: 2020-04-26 | Discharge: 2020-04-26 | Disposition: A | Discharge status: Home / Self Care | Payer: Managed Care, Other (non HMO) | Source: Ambulatory Visit | Attending: Hematology and Oncology | Admitting: Hematology and Oncology

## 2020-04-26 DIAGNOSIS — C539 Malignant neoplasm of cervix uteri, unspecified: Secondary | ICD-10-CM

## 2020-04-26 DIAGNOSIS — C55 Malignant neoplasm of uterus, part unspecified: Secondary | ICD-10-CM | POA: Insufficient documentation

## 2020-04-26 HISTORY — PX: IR IMAGING GUIDED PORT INSERTION: IMG5740

## 2020-04-26 LAB — CBC WITH DIFFERENTIAL/PLATELET
Abs Immature Granulocytes: 0.01 10*3/uL (ref 0.00–0.07)
Basophils Absolute: 0 10*3/uL (ref 0.0–0.1)
Basophils Relative: 1 %
Eosinophils Absolute: 0.1 10*3/uL (ref 0.0–0.5)
Eosinophils Relative: 1 %
HCT: 35.2 % — ABNORMAL LOW (ref 36.0–46.0)
Hemoglobin: 11.4 g/dL — ABNORMAL LOW (ref 12.0–15.0)
Immature Granulocytes: 0 %
Lymphocytes Relative: 32 %
Lymphs Abs: 1.9 10*3/uL (ref 0.7–4.0)
MCH: 28.6 pg (ref 26.0–34.0)
MCHC: 32.4 g/dL (ref 30.0–36.0)
MCV: 88.2 fL (ref 80.0–100.0)
Monocytes Absolute: 0.6 10*3/uL (ref 0.1–1.0)
Monocytes Relative: 10 %
Neutro Abs: 3.4 10*3/uL (ref 1.7–7.7)
Neutrophils Relative %: 56 %
Platelets: 220 10*3/uL (ref 150–400)
RBC: 3.99 MIL/uL (ref 3.87–5.11)
RDW: 13.9 % (ref 11.5–15.5)
WBC: 6 10*3/uL (ref 4.0–10.5)
nRBC: 0 % (ref 0.0–0.2)

## 2020-04-26 LAB — COMPREHENSIVE METABOLIC PANEL
ALT: 15 U/L (ref 0–44)
AST: 15 U/L (ref 15–41)
Albumin: 4.3 g/dL (ref 3.5–5.0)
Alkaline Phosphatase: 41 U/L (ref 38–126)
Anion gap: 9 (ref 5–15)
BUN: 25 mg/dL — ABNORMAL HIGH (ref 8–23)
CO2: 24 mmol/L (ref 22–32)
Calcium: 9.4 mg/dL (ref 8.9–10.3)
Chloride: 106 mmol/L (ref 98–111)
Creatinine, Ser: 1.14 mg/dL — ABNORMAL HIGH (ref 0.44–1.00)
GFR, Estimated: 54 mL/min — ABNORMAL LOW (ref >60)
Glucose, Bld: 91 mg/dL (ref 70–99)
Potassium: 3.9 mmol/L (ref 3.5–5.1)
Sodium: 139 mmol/L (ref 135–145)
Total Bilirubin: 0.7 mg/dL (ref 0.3–1.2)
Total Protein: 7.4 g/dL (ref 6.5–8.1)

## 2020-04-26 MED ORDER — FENTANYL CITRATE (PF) 100 MCG/2ML IJ SOLN
INTRAMUSCULAR | Status: AC
  Filled 2020-04-26: qty 2

## 2020-04-26 MED ORDER — HEPARIN SOD (PORK) LOCK FLUSH 100 UNIT/ML IV SOLN
INTRAVENOUS | Status: AC | PRN
  Administered 2020-04-26: 500 [IU] via INTRAVENOUS

## 2020-04-26 MED ORDER — LIDOCAINE-EPINEPHRINE 1 %-1:100000 IJ SOLN
INTRAMUSCULAR | Status: AC
  Filled 2020-04-26: qty 1

## 2020-04-26 MED ORDER — LIDOCAINE-EPINEPHRINE 1 %-1:100000 IJ SOLN
INTRAMUSCULAR | Status: AC | PRN
  Administered 2020-04-26: 5 mL
  Administered 2020-04-26: 10 mL

## 2020-04-26 MED ORDER — FENTANYL CITRATE (PF) 100 MCG/2ML IJ SOLN
INTRAMUSCULAR | Status: AC | PRN
Start: 2020-04-26 — End: 2020-04-26
  Administered 2020-04-26 (×2): 50 ug via INTRAVENOUS

## 2020-04-26 MED ORDER — HEPARIN SOD (PORK) LOCK FLUSH 100 UNIT/ML IV SOLN
INTRAVENOUS | Status: AC
  Filled 2020-04-26: qty 5

## 2020-04-26 MED ORDER — MIDAZOLAM HCL 2 MG/2ML IJ SOLN
INTRAMUSCULAR | Status: AC | PRN
  Administered 2020-04-26 (×3): 1 mg via INTRAVENOUS

## 2020-04-26 MED ORDER — CLINDAMYCIN PHOSPHATE 900 MG/50ML IV SOLN
INTRAVENOUS | Status: AC
  Administered 2020-04-26: 900 mg via INTRAVENOUS
  Filled 2020-04-26: qty 50

## 2020-04-26 MED ORDER — CLINDAMYCIN PHOSPHATE 900 MG/50ML IV SOLN: 900.0000 mg | Freq: Once | INTRAVENOUS | Status: AC

## 2020-04-26 MED ORDER — MIDAZOLAM HCL 2 MG/2ML IJ SOLN
INTRAMUSCULAR | Status: AC
  Filled 2020-04-26: qty 4

## 2020-04-26 MED ORDER — SODIUM CHLORIDE 0.9 % IV SOLN: INTRAVENOUS | Status: DC

## 2020-04-26 NOTE — Progress Notes (Signed)
Gynecologic Oncology Multi-Disciplinary Disposition Conference Note  Date of the Conference: 04/26/2020  Patient Name: Phyllis Hebert  Referring Provider: Dr. Murrell Redden Primary GYN Oncologist: Dr. Denman George  Stage/Disposition:  Stage IV endocervical adenocarcinoma. Disposition is to definitive chemoradiation with external beam with radiation field to cover the right ischial bone metastasis and 5 brachytherapy treatments as well as weekly cisplatin chemotherapy. Followed by a 6-8 week break and then more systemic chemotherapy.  This Multidisciplinary conference took place involving physicians from [place], [place], Radiation Oncology, Pathology, Radiology along with the Gynecologic Oncology Nurse Practitioner and RN.  Comprehensive assessment of the patient's malignancy, staging, need for surgery, chemotherapy, radiation therapy, and need for further testing were reviewed. Supportive measures, both inpatient and following discharge were also discussed. The recommended plan of care is documented. Greater than 35 minutes were spent correlating and coordinating this patient's care.

## 2020-04-26 NOTE — Procedures (Signed)
Interventional Radiology Procedure Note  Procedure: Placement of a right IJ approach single lumen PowerPort.  Tip is positioned at the superior cavoatrial junction and catheter is ready for immediate use.  Complications: No immediate Recommendations:  - Ok to shower tomorrow - Do not submerge for 7 days - Routine line care   Signed,  Orian Figueira K. Annisha Baar, MD   

## 2020-04-26 NOTE — Discharge Instructions (Signed)

## 2020-04-26 NOTE — Consult Note (Signed)
Chief Complaint: Patient was seen in consultation today for port a cath placement  Referring Physician(s): [place]  Supervising Physician: Jacqulynn Cadet  Patient Status: [facility] - Out-pt  Metastatic cervical cancer present Illness: Phyllis Hebert is a 64 y.o. female with history of newly diagnosed metastatic cervical cancer who presents today for Port-A-Cath placement for chemotherapy.  Past Medical History:  Diagnosis Date  . Cervical cancer ([place])   . Colon polyps   . Gastric ulcer   . Lupus [facility])     Past Surgical History:  Procedure Laterality Date  . catarac Left   . COLONOSCOPY    . ENDOMETRIAL ABLATION  2010  . ESOPHAGOGASTRODUODENOSCOPY      Allergies: Penicillins  Medications: Prior to Admission medications   Medication Sig Start Date End Date Taking? Authorizing Provider  Cholecalciferol (VITAMIN D3) 2000 units TABS Take 2 tablets by mouth daily.    [provider]  clobetasol cream (TEMOVATE) 0.05 % Apply topically. Left leg 10/17/19   [provider]  fenofibrate 160 MG tablet TAKE 1 TABLET BY MOUTH EVERY DAY 02/25/20   [name], MD  hydroxychloroquine (PLAQUENIL) 200 MG tablet 2 TABLET WITH FOOD OR MILK ONCE A DAY ORALLY 30 05/22/17   [provider]  lidocaine-prilocaine (EMLA) cream Apply to affected area once 04/19/20   [name], MD  ondansetron (ZOFRAN) 8 MG tablet Take 1 tablet (8 mg total) by mouth every 8 (eight) hours as needed. Start on the third day after cisplatin chemotherapy. 04/19/20   [name], MD  pantoprazole (PROTONIX) 40 MG tablet Take 1 tablet (40 mg total) by mouth 2 (two) times daily. 04/06/20   Thornton Park, MD  prochlorperazine (COMPAZINE) 10 MG tablet Take 1 tablet (10 mg total) by mouth every 6 (six) hours as needed (Nausea or vomiting). 04/19/20   [name], MD     Family History  Problem Relation Age of Onset  . Hypertension Mother   . Emphysema Mother   . Uterine cancer  Mother 82  . Hypertension Father   . Stroke Father   . Hypertension Brother   . Healthy Daughter     Social History   Socioeconomic History  . Marital status: Married    Spouse name: [name]  . Number of children: 2  . Years of education: Not on file  . Highest education level: Not on file  Occupational History  . Occupation: retired  Tobacco Use  . Smoking status: Never Smoker  . Smokeless tobacco: Never Used  Vaping Use  . Vaping Use: Never used  Substance and Sexual Activity  . Alcohol use: No  . Drug use: No  . Sexual activity: Not on file  Other Topics Concern  . Not on file  Social History Narrative  . Not on file   Social Determinants of Health   Financial Resource Strain:   . Difficulty of Paying Living Expenses: Not on file  Food Insecurity:   . Worried About Charity fundraiser in the Last Year: Not on file  . Ran Out of Food in the Last Year: Not on file  Transportation Needs:   . Lack of Transportation (Medical): Not on file  . Lack of Transportation (Non-Medical): Not on file  Physical Activity:   . Days of Exercise per Week: Not on file  . Minutes of Exercise per Session: Not on file  Stress:   . Feeling of Stress : Not on file  Social Connections:   . Frequency  of Communication with Friends and Family: Not on file  . Frequency of Social Gatherings with Friends and Family: Not on file  . Attends Religious Services: Not on file  . Active Member of Clubs or Organizations: Not on file  . Attends Archivist Meetings: Not on file  . Marital Status: Not on file      Review of Systems currently denies fever, headache, chest pain, dyspnea, cough, back pain, nausea, vomiting.  She does have some lower abdominal discomfort and occasional vaginal spotting.  Vital Signs:pending   Physical Exam awake, alert.  Chest with some slightly diminished breath sounds right base, left clear.  Heart with regular rate and rhythm.  Abdomen soft, positive bowel  sounds, some mildly tender lower abdominal/pelvic tenderness to palpation.  No lower extremity edema.  Imaging: NM PET Image Initial (PI) Skull Base To Thigh  Result Date: 04/19/2020 CLINICAL DATA:  Initial treatment strategy for clinical stage IIB cervical cancer. 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 EXAM: NUCLEAR MEDICINE PET SKULL BASE TO THIGH TECHNIQUE: 10.6 mCi F-18 FDG was injected intravenously. Full-ring PET imaging was performed from the skull base to thigh after the radiotracer. CT data was obtained and used for attenuation correction and anatomic localization. Fasting blood glucose: 99 mg/dl COMPARISON:  None. FINDINGS: Mediastinal blood pool activity: SUV max 3.0 Liver activity: SUV max NA NECK: No hypermetabolic lymph nodes in the neck. Incidental CT findings: none CHEST: No enlarged or hypermetabolic axillary, mediastinal or hilar lymph nodes. No hypermetabolic pulmonary findings. Incidental CT findings: Subpleural 0.6 cm solid anterior right upper lobe pulmonary nodule, below PET resolution (series 8/image 14). Solid 0.4 cm superior segment left lower lobe pulmonary nodule (series 8/image 20), below PET resolution. ABDOMEN/PELVIS: Hypermetabolic uterine cervix mass with hypermetabolism extending into the upper third of the vagina and extending throughout the uterine body with max SUV 14.6, without frank extrauterine extension. Mildly enlarged 1.0 cm hypermetabolic right common iliac lymph node with max SUV 7.3 (series 4/image 152). No additional enlarged or hypermetabolic pelvic lymph nodes. No abnormal hypermetabolic activity within the liver, pancreas, adrenal glands, or spleen. No hypermetabolic lymph nodes in the abdomen. Incidental CT findings: Simple 3.0 cm left adnexal cyst (series 4/image 160). Mild to moderate left hydroureteronephrosis to the level of the left ureterovesical junction. Mildly atherosclerotic nonaneurysmal abdominal aorta. Simple 2.8 cm anterior left liver cyst.  SKELETON: Hypermetabolic 1.3 cm sclerotic right ischial lesion with max SUV 9.5 (series 4/image 27). No additional hypermetabolic osseous lesions. Incidental CT findings: none IMPRESSION: 1. Hypermetabolic uterine cervix mass compatible with known primary cervical malignancy, with hypermetabolism extending into the upper third of the vagina and extending throughout the uterine body. No frank extrauterine extension. 2. Mild to moderate left hydroureteronephrosis to the level of the left UVJ, concerning for tumor involvement of the left UVJ. 3. Hypermetabolic right common iliac nodal metastasis. 4. Hypermetabolic sclerotic right ischial bone metastasis. 5. Two indeterminate small scattered solid pulmonary nodules, below PET resolution, warranting attention on chest CT follow-up in 3 months. 6.  Aortic Atherosclerosis (ICD10-I70.0). Electronically Signed   By: Ilona Sorrel M.D.   On: 04/19/2020 11:15    Labs:  CBC: Recent Labs    04/01/20 0959 04/19/20 1522  WBC 8.8 7.3  HGB 11.6* 11.7*  HCT 35.0* 35.9*  PLT 282 267    COAGS: No results for input(s): INR, APTT in the last 8760 hours.  BMP: Recent Labs    04/01/20 0959 04/19/20 1522  NA 139 142  K 4.5  4.0  CL 106 106  CO2 29 29  GLUCOSE 90 83  BUN 24* 23  CALCIUM 10.0 10.6*  CREATININE 1.34* 1.31*  GFRNONAA 42* 46*    LIVER FUNCTION TESTS: Recent Labs    04/19/20 1522  BILITOT 0.3  AST 14*  ALT 15  ALKPHOS 46  PROT 7.7  ALBUMIN 4.2    TUMOR MARKERS: No results for input(s): AFPTM, CEA, CA199, CHROMGRNA in the last 8760 hours.  Assessment and Plan: 64 y.o. female with history of newly diagnosed metastatic cervical cancer who presents today for Port-A-Cath placement for chemotherapy.Risks and benefits of image guided port-a-catheter placement was discussed with the patient including, but not limited to bleeding, infection, pneumothorax, or fibrin sheath development and need for additional procedures.  All of the  patient's questions were answered, patient is agreeable to proceed. Consent signed and in chart.      Thank you for this interesting consult.  I greatly enjoyed meeting Jakita L Tiznado and look forward to participating in their care.  A copy of this report was sent to the requesting provider on this date.  Electronically Signed: D. Rowe Robert, PA-C 04/26/2020, 10:06 AM   I spent a total of 25 minutes    in face to face in clinical consultation, greater than 50% of which was counseling/coordinating care for Port-A-Cath placement

## 2020-04-26 NOTE — Telephone Encounter (Signed)
Requested CBC p diff and CMP with port placement with [facility].

## 2020-04-27 ENCOUNTER — Ambulatory Visit
Admission: RE | Admit: 2020-04-27 | Discharge: 2020-04-27 | Disposition: A | Discharge status: Home / Self Care | Payer: Managed Care, Other (non HMO) | Source: Ambulatory Visit | Attending: Radiation Oncology | Admitting: Radiation Oncology

## 2020-04-27 DIAGNOSIS — Z510 Encounter for antineoplastic radiation therapy: Secondary | ICD-10-CM | POA: Insufficient documentation

## 2020-04-27 DIAGNOSIS — Z23 Encounter for immunization: Secondary | ICD-10-CM | POA: Insufficient documentation

## 2020-04-27 DIAGNOSIS — M329 Systemic lupus erythematosus, unspecified: Secondary | ICD-10-CM | POA: Insufficient documentation

## 2020-04-27 DIAGNOSIS — Z79899 Other long term (current) drug therapy: Secondary | ICD-10-CM | POA: Insufficient documentation

## 2020-04-27 DIAGNOSIS — C539 Malignant neoplasm of cervix uteri, unspecified: Secondary | ICD-10-CM | POA: Insufficient documentation

## 2020-04-27 DIAGNOSIS — Z5111 Encounter for antineoplastic chemotherapy: Secondary | ICD-10-CM | POA: Insufficient documentation

## 2020-04-27 DIAGNOSIS — C530 Malignant neoplasm of endocervix: Secondary | ICD-10-CM | POA: Insufficient documentation

## 2020-04-27 DIAGNOSIS — F064 Anxiety disorder due to known physiological condition: Secondary | ICD-10-CM | POA: Insufficient documentation

## 2020-04-27 DIAGNOSIS — C775 Secondary and unspecified malignant neoplasm of intrapelvic lymph nodes: Secondary | ICD-10-CM | POA: Insufficient documentation

## 2020-04-27 DIAGNOSIS — R030 Elevated blood-pressure reading, without diagnosis of hypertension: Secondary | ICD-10-CM | POA: Insufficient documentation

## 2020-04-27 DIAGNOSIS — N1330 Unspecified hydronephrosis: Secondary | ICD-10-CM | POA: Insufficient documentation

## 2020-04-27 DIAGNOSIS — C7951 Secondary malignant neoplasm of bone: Secondary | ICD-10-CM | POA: Insufficient documentation

## 2020-04-29 ENCOUNTER — Encounter: Payer: Self-pay | Admitting: Hematology and Oncology

## 2020-04-29 ENCOUNTER — Inpatient Hospital Stay (HOSPITAL_BASED_OUTPATIENT_CLINIC_OR_DEPARTMENT_OTHER): Payer: Managed Care, Other (non HMO) | Admitting: Hematology and Oncology

## 2020-04-29 ENCOUNTER — Telehealth: Payer: Self-pay | Admitting: Hematology and Oncology

## 2020-04-29 ENCOUNTER — Other Ambulatory Visit: Payer: Self-pay

## 2020-04-29 VITALS — BP 153/73 | HR 77 | Temp 98.9°F | Resp 16 | Ht 64.5 in | Wt 209.2 lb

## 2020-04-29 DIAGNOSIS — C169 Malignant neoplasm of stomach, unspecified: Secondary | ICD-10-CM | POA: Diagnosis not present

## 2020-04-29 DIAGNOSIS — C539 Malignant neoplasm of cervix uteri, unspecified: Secondary | ICD-10-CM | POA: Diagnosis not present

## 2020-04-29 DIAGNOSIS — Z809 Family history of malignant neoplasm, unspecified: Secondary | ICD-10-CM | POA: Diagnosis not present

## 2020-04-29 DIAGNOSIS — C7951 Secondary malignant neoplasm of bone: Secondary | ICD-10-CM | POA: Diagnosis not present

## 2020-04-29 DIAGNOSIS — Z7189 Other specified counseling: Secondary | ICD-10-CM | POA: Insufficient documentation

## 2020-04-29 DIAGNOSIS — Z5111 Encounter for antineoplastic chemotherapy: Secondary | ICD-10-CM | POA: Diagnosis not present

## 2020-04-29 DIAGNOSIS — N1330 Unspecified hydronephrosis: Secondary | ICD-10-CM

## 2020-04-29 NOTE — Progress Notes (Signed)
Met with patient at registration to introduce myself as Arboriculturist and to offer available resources.  Discussed one-time $1000 Radio broadcast assistant to assist with personal expenses while going through treatment.  Gave her my card if interested in applying and for any additional finanicial questions or concerns.

## 2020-04-29 NOTE — Telephone Encounter (Signed)
Scheduled appointment per 11/11 sch msg. Spoke to patient who is aware of appointment date and time.

## 2020-04-29 NOTE — Assessment & Plan Note (Signed)
Renal function is stable We will proceed with mildly reduced dose cisplatin as above

## 2020-04-29 NOTE — Assessment & Plan Note (Signed)
The orthodontist recommend 1-2 been removed prior to Zometa Due to plan to start chemotherapy as soon as possible, we will put that on hold until early next year For now, I do not recommend prescribing Zometa until dental extraction is completed

## 2020-04-29 NOTE — Progress Notes (Signed)
[facility] OFFICE PROGRESS NOTE  Patient Care Team: [name], MD as PCP - General (Family Medicine) [name], MD as Consulting Physician (Obstetrics and Gynecology) [name], RN as Oncology Nurse Navigator (Oncology)  ASSESSMENT & PLAN:  Cervical cancer, FIGO stage IVB [facility]) Her case was recently reviewed at the GYN oncology tumor board The plan would be aggressive local treatment with concurrent chemoradiation therapy with cisplatin followed by further treatment with more aggressive chemotherapy after completion of radiation treatment  We discussed the role of chemotherapy. The intent is of palliative intent.  We discussed some of the risks, benefits, side-effects of cisplatin and its role as chemo sensitizing agent. The plan for weekly cisplatin for x5-6 doses along with radiation treatment.  Some of the short term side-effects included, though not limited to, including weight loss, life threatening infections, risk of allergic reactions, need for transfusions of blood products, nausea, vomiting, change in bowel habits, loss of hair, admission to hospital for various reasons, and risks of death.   Long term side-effects are also discussed including risks of infertility, permanent damage to nerve function, hearing loss, chronic fatigue, kidney damage with possibility needing hemodialysis, and rare secondary malignancy including bone marrow disorders.  The patient is aware that the response rates discussed earlier is not guaranteed.  After a long discussion, patient made an informed decision to proceed with the prescribed plan of care.   Patient education material was dispensed. We will get her started on chemotherapy tomorrow I will see her on a weekly basis for further follow-up I plan minor upfront dose adjustment of cisplatin at 35 mg /m2 given hydronephrosis and mildly reduced renal function    Metastasis to bone [facility]) The orthodontist  recommend 1-2 been removed prior to Zometa Due to plan to start chemotherapy as soon as possible, we will put that on hold until early next year For now, I do not recommend prescribing Zometa until dental extraction is completed  Hydronephrosis of left kidney Renal function is stable We will proceed with mildly reduced dose cisplatin as above  Family history of cancer She has strong family history of uterine cancer and personal history of cervical cancer I will refer her to see genetic counselor  Goals of care, counseling/discussion The goals of treatment is palliative in nature She has advanced directives at home   Orders Placed This Encounter  Procedures  . Ambulatory referral to Genetics    Referral Priority:   Routine    Referral Type:   Consultation    Referral Reason:   Specialty Services Required    Requested Specialty:   Genetics    Number of Visits Requested:   1    All questions were answered. The patient knows to call the clinic with any problems, questions or concerns. The total time spent in the appointment was 30 minutes encounter with patients including review of chart and various tests results, discussions about plan of care and coordination of care plan   [name], MD 04/29/2020 12:31 PM  INTERVAL HISTORY: Please see below for problem oriented charting. She returns for chemotherapy consent and review of plan of care Since last time I saw her, she had very minor lower pelvic discomfort She is still eating well and has good urinary flow She tolerated port placement well She saw her dentist regarding dental clearance prior to Zometa She was recommended to see an orthodontist and he recommended 1 tooth extraction before Zometa Due to upcoming chemotherapy, her  procedure was placed on hold  SUMMARY OF ONCOLOGIC HISTORY: Oncology History  Cervical cancer, FIGO stage IVB ([place])  03/16/2020 Initial Diagnosis   The patient had history of new onset vaginal  spotting on March 16, 2020.  She immediately called her OB/GYN's office and achieved an appointment with her OB/GYN's partner, Dr. Murrell Redden, who saw and evaluated the patient on March 19, 2020.  At that time a transvaginal ultrasound scan was performed which revealed a uterus measuring 5.3 x 8.4 x 4.6 cm with an ill-defined endometrium.  There is a complex cyst on the left ovary measuring 2.6 cm with peripheral blood flow noted.  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  Immunostains were positive for CEA and CDX2, negative for p16, ER, vimentin, and PAX8.  The differential diagnosis included primary gastric type endocervical adenocarcinoma (though this typically stains positive for PAX8) or a metastasis from pancreaticobiliary or upper GI primary.   03/29/2020 Pathology Results   1. Surgical [P], duodenum - PEPTIC DUODENITIS. - NO DYSPLASIA OR MALIGNANCY. 2. Surgical [P], gastric antrum and gastric body - REACTIVE GASTROPATHY. [place] IS NEGATIVE FOR HELICOBACTER PYLORI. - NO INTESTINAL METAPLASIA, DYSPLASIA, OR MALIGNANCY. 3. Surgical [P], gastric nodule - MILD REACTIVE GASTROPATHY. - NO INTESTINAL METAPLASIA, DYSPLASIA, OR MALIGNANCY. 4. Surgical [P], stomach, lesser curve ulcers - REACTIVE GASTROPATHY WITH EROSIONS. - NO INTESTINAL METAPLASIA, DYSPLASIA, OR MALIGNANCY. 5. Surgical [P], gastric polyps - HYPERPLASTIC POLYP. - NO INTESTINAL METAPLASIA, DYSPLASIA OR MALIGNANCY. 6. Surgical [P], colon, descending, polyp - HYPERPLASTIC POLYP. - NO DYSPLASIA OR MALIGNANCY.   04/06/2020 Procedure   EGD report  Normal esophagus. - A few gastric polyps. Suspected fundic gland polyps. - Non-bleeding gastric ulcers with pigmented material. Biopsied. - A single gastric polyp/nodule. Resected and retrieved. - Erythematous duodenopathy. Biopsied. - The examination was  otherwise normal.   04/06/2020 Procedure   Colonoscopy report - Non-bleeding internal hemorrhoids. - One 4 mm polyp in the descending colon, removed with a cold snare. Resected and retrieved. - The examination was otherwise normal on direct and retroflexion views   04/19/2020 PET scan   1. Hypermetabolic uterine cervix mass compatible with known primary cervical malignancy, with hypermetabolism extending into the upper third of the vagina and extending throughout the uterine body. No frank extrauterine extension. 2. Mild to moderate left hydroureteronephrosis to the level of the left UVJ, concerning for tumor involvement of the left UVJ. 3. Hypermetabolic right common iliac nodal metastasis. 4. Hypermetabolic sclerotic right ischial bone metastasis. 5. Two indeterminate small scattered solid pulmonary nodules, below PET resolution, warranting attention on chest CT follow-up in 3 months. 6.  Aortic Atherosclerosis (ICD10-I70.0).   04/19/2020 Cancer Staging   Staging form: Cervix Uteri, AJCC Version 9 - Clinical stage from 04/19/2020: FIGO Stage IVB (cT3b, cN1a, cM1) - Signed by [name], MD on 04/19/2020   04/26/2020 Procedure   Successful placement of a right IJ approach Power Port with ultrasound and fluoroscopic guidance. The catheter is ready for use   Metastasis to bone ([place])  04/19/2020 Initial Diagnosis   Metastasis to bone ([facility])     REVIEW OF SYSTEMS:   Constitutional: Denies fevers, chills or abnormal weight loss Eyes: Denies blurriness of vision Ears, nose, mouth, throat, and face: Denies mucositis or sore throat Respiratory: Denies cough, dyspnea or wheezes Cardiovascular: Denies palpitation, chest discomfort or lower extremity swelling Gastrointestinal:  Denies nausea, heartburn or change in bowel habits Skin:  Denies abnormal skin rashes Lymphatics: Denies new lymphadenopathy or easy bruising Neurological:Denies numbness, tingling or new weaknesses Behavioral/Psych:  Mood is stable, no new changes  All other systems were reviewed with the patient and are negative.  I have reviewed the past medical history, past surgical history, social history and family history with the patient and they are unchanged from previous note.  ALLERGIES:  is allergic to penicillins.  MEDICATIONS:  Current Outpatient Medications  Medication Sig Dispense Refill  . Cholecalciferol (VITAMIN D3) 2000 units TABS Take 2 tablets by mouth daily.    . clobetasol cream (TEMOVATE) 0.05 % Apply topically. Left leg    . fenofibrate 160 MG tablet TAKE 1 TABLET BY MOUTH EVERY DAY 30 tablet 5  . hydroxychloroquine (PLAQUENIL) 200 MG tablet 2 TABLET WITH FOOD OR MILK ONCE A DAY ORALLY 30  2  . lidocaine-prilocaine (EMLA) cream Apply to affected area once 30 g 3  . ondansetron (ZOFRAN) 8 MG tablet Take 1 tablet (8 mg total) by mouth every 8 (eight) hours as needed. Start on the third day after cisplatin chemotherapy. 30 tablet 1  . pantoprazole (PROTONIX) 40 MG tablet Take 1 tablet (40 mg total) by mouth 2 (two) times daily. 90 tablet 3  . prochlorperazine (COMPAZINE) 10 MG tablet Take 1 tablet (10 mg total) by mouth every 6 (six) hours as needed (Nausea or vomiting). 30 tablet 1   No current facility-administered medications for this visit.    PHYSICAL EXAMINATION: ECOG PERFORMANCE STATUS: 0 - Asymptomatic  Vitals:   04/29/20 1138  BP: (!) 153/73  Pulse: 77  Resp: 16  Temp: 98.9 F (37.2 C)  SpO2: 99%   Filed Weights   04/29/20 1138  Weight: 209 lb 3.2 oz (94.9 kg)    GENERAL:alert, no distress and comfortable SKIN: skin color, texture, turgor are normal, no rashes or significant lesions EYES: normal, Conjunctiva are pink and non-injected, sclera clear OROPHARYNX:no exudate, no erythema and lips, buccal mucosa, and tongue normal  NECK: supple, thyroid normal size, non-tender, without nodularity LYMPH:  no palpable lymphadenopathy in the cervical, axillary or inguinal LUNGS:  clear to auscultation and percussion with normal breathing effort HEART: regular rate & rhythm and no murmurs and no lower extremity edema ABDOMEN:abdomen soft, non-tender and normal bowel sounds Musculoskeletal:no cyanosis of digits and no clubbing  NEURO: alert & oriented x 3 with fluent speech, no focal motor/sensory deficits  LABORATORY DATA:  I have reviewed the data as listed    Component Value Date/Time   NA 139 04/26/2020 1010   NA 140 06/21/2016 1530   K 3.9 04/26/2020 1010   CL 106 04/26/2020 1010   CO2 24 04/26/2020 1010   GLUCOSE 91 04/26/2020 1010   BUN 25 (H) 04/26/2020 1010   BUN 16 06/21/2016 1530   CREATININE 1.14 (H) 04/26/2020 1010   CREATININE 0.87 07/02/2017 1549   CALCIUM 9.4 04/26/2020 1010   PROT 7.4 04/26/2020 1010   PROT 7.3 06/21/2016 1530   ALBUMIN 4.3 04/26/2020 1010   ALBUMIN 4.3 06/21/2016 1530   AST 15 04/26/2020 1010   ALT 15 04/26/2020 1010   ALKPHOS 41 04/26/2020 1010   BILITOT 0.7 04/26/2020 1010   BILITOT 0.3 06/21/2016 1530   GFRNONAA 54 (L) 04/26/2020 1010   GFRAA 111 06/21/2016 1530    No results found for: SPEP, UPEP  Lab Results  Component Value Date   WBC 6.0 04/26/2020   NEUTROABS 3.4 04/26/2020   HGB 11.4 (L) 04/26/2020   HCT  35.2 (L) 04/26/2020   MCV 88.2 04/26/2020   PLT 220 04/26/2020      Chemistry      Component Value Date/Time   NA 139 04/26/2020 1010   NA 140 06/21/2016 1530   K 3.9 04/26/2020 1010   CL 106 04/26/2020 1010   CO2 24 04/26/2020 1010   BUN 25 (H) 04/26/2020 1010   BUN 16 06/21/2016 1530   CREATININE 1.14 (H) 04/26/2020 1010   CREATININE 0.87 07/02/2017 1549      Component Value Date/Time   CALCIUM 9.4 04/26/2020 1010   ALKPHOS 41 04/26/2020 1010   AST 15 04/26/2020 1010   ALT 15 04/26/2020 1010   BILITOT 0.7 04/26/2020 1010   BILITOT 0.3 06/21/2016 1530       RADIOGRAPHIC STUDIES: I have personally reviewed the radiological images as listed and agreed with the findings in the  report. NM PET Image Initial (PI) Skull Base To Thigh  Result Date: 04/19/2020 CLINICAL DATA:  Initial treatment strategy for clinical stage IIB cervical cancer. 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 EXAM: NUCLEAR MEDICINE PET SKULL BASE TO THIGH TECHNIQUE: 10.6 mCi F-18 FDG was injected intravenously. Full-ring PET imaging was performed from the skull base to thigh after the radiotracer. CT data was obtained and used for attenuation correction and anatomic localization. Fasting blood glucose: 99 mg/dl COMPARISON:  None. FINDINGS: Mediastinal blood pool activity: SUV max 3.0 Liver activity: SUV max NA NECK: No hypermetabolic lymph nodes in the neck. Incidental CT findings: none CHEST: No enlarged or hypermetabolic axillary, mediastinal or hilar lymph nodes. No hypermetabolic pulmonary findings. Incidental CT findings: Subpleural 0.6 cm solid anterior right upper lobe pulmonary nodule, below PET resolution (series 8/image 14). Solid 0.4 cm superior segment left lower lobe pulmonary nodule (series 8/image 20), below PET resolution. ABDOMEN/PELVIS: Hypermetabolic uterine cervix mass with hypermetabolism extending into the upper third of the vagina and extending throughout the uterine body with max SUV 14.6, without frank extrauterine extension. Mildly enlarged 1.0 cm hypermetabolic right common iliac lymph node with max SUV 7.3 (series 4/image 152). No additional enlarged or hypermetabolic pelvic lymph nodes. No abnormal hypermetabolic activity within the liver, pancreas, adrenal glands, or spleen. No hypermetabolic lymph nodes in the abdomen. Incidental CT findings: Simple 3.0 cm left adnexal cyst (series 4/image 160). Mild to moderate left hydroureteronephrosis to the level of the left ureterovesical junction. Mildly atherosclerotic nonaneurysmal abdominal aorta. Simple 2.8 cm anterior left liver cyst. SKELETON: Hypermetabolic 1.3 cm sclerotic right ischial lesion with max SUV 9.5 (series 4/image 27). No  additional hypermetabolic osseous lesions. Incidental CT findings: none IMPRESSION: 1. Hypermetabolic uterine cervix mass compatible with known primary cervical malignancy, with hypermetabolism extending into the upper third of the vagina and extending throughout the uterine body. No frank extrauterine extension. 2. Mild to moderate left hydroureteronephrosis to the level of the left UVJ, concerning for tumor involvement of the left UVJ. 3. Hypermetabolic right common iliac nodal metastasis. 4. Hypermetabolic sclerotic right ischial bone metastasis. 5. Two indeterminate small scattered solid pulmonary nodules, below PET resolution, warranting attention on chest CT follow-up in 3 months. 6.  Aortic Atherosclerosis (ICD10-I70.0). Electronically Signed   By: Ilona Sorrel M.D.   On: 04/19/2020 11:15   IR IMAGING GUIDED PORT INSERTION  Result Date: 04/26/2020 INDICATION: Stage IV B cervical cancer. She presents for port catheter placement for chemotherapy. EXAM: IMPLANTED PORT A CATH PLACEMENT WITH ULTRASOUND AND FLUOROSCOPIC GUIDANCE MEDICATIONS: 900 mg clindamycin; The antibiotic was administered within an appropriate time interval prior to  skin puncture. ANESTHESIA/SEDATION: Versed 3 mg IV; Fentanyl 100 mcg IV; Moderate Sedation Time:  19 minutes The patient was continuously monitored during the procedure by the interventional radiology nurse under my direct supervision. FLUOROSCOPY TIME:  0 minutes, 6 seconds (2 mGy) COMPLICATIONS: None immediate. PROCEDURE: The right neck and chest was prepped with chlorhexidine, and draped in the usual sterile fashion using maximum barrier technique (cap and mask, sterile gown, sterile gloves, large sterile sheet, hand hygiene and cutaneous antiseptic). Local anesthesia was attained by infiltration with 1% lidocaine with epinephrine. Ultrasound demonstrated patency of the right internal jugular vein, and this was documented with an image. Under real-time ultrasound guidance,  this vein was accessed with a 21 gauge micropuncture needle and image documentation was performed. A small dermatotomy was made at the access site with an 11 scalpel. A 0.018" wire was advanced into the SVC and the access needle exchanged for a 44F micropuncture vascular sheath. The 0.018" wire was then removed and a 0.035" wire advanced into the IVC. An appropriate location for the subcutaneous reservoir was selected below the clavicle and an incision was made through the skin and underlying soft tissues. The subcutaneous tissues were then dissected using a combination of blunt and sharp surgical technique and a pocket was formed. A single lumen power injectable portacatheter was then tunneled through the subcutaneous tissues from the pocket to the dermatotomy and the port reservoir placed within the subcutaneous pocket. The venous access site was then serially dilated and a peel away vascular sheath placed over the wire. The wire was removed and the port catheter advanced into position under fluoroscopic guidance. The catheter tip is positioned in the superior cavoatrial junction. This was documented with a spot image. The portacatheter was then tested and found to flush and aspirate well. The port was flushed with saline followed by 100 units/mL heparinized saline. The pocket was then closed in two layers using first subdermal inverted interrupted absorbable sutures followed by a running subcuticular suture. The epidermis was then sealed with Dermabond. The dermatotomy at the venous access site was also closed with Dermabond. IMPRESSION: Successful placement of a right IJ approach Power Port with ultrasound and fluoroscopic guidance. The catheter is ready for use. Electronically Signed   By: Jacqulynn Cadet M.D.   On: 04/26/2020 13:18

## 2020-04-29 NOTE — Assessment & Plan Note (Signed)
The goals of treatment is palliative in nature She has advanced directives at home

## 2020-04-29 NOTE — Assessment & Plan Note (Addendum)
Her case was recently reviewed at the GYN oncology tumor board The plan would be aggressive local treatment with concurrent chemoradiation therapy with cisplatin followed by further treatment with more aggressive chemotherapy after completion of radiation treatment  We discussed the role of chemotherapy. The intent is of palliative intent.  We discussed some of the risks, benefits, side-effects of cisplatin and its role as chemo sensitizing agent. The plan for weekly cisplatin for x5-6 doses along with radiation treatment.  Some of the short term side-effects included, though not limited to, including weight loss, life threatening infections, risk of allergic reactions, need for transfusions of blood products, nausea, vomiting, change in bowel habits, loss of hair, admission to hospital for various reasons, and risks of death.   Long term side-effects are also discussed including risks of infertility, permanent damage to nerve function, hearing loss, chronic fatigue, kidney damage with possibility needing hemodialysis, and rare secondary malignancy including bone marrow disorders.  The patient is aware that the response rates discussed earlier is not guaranteed.  After a long discussion, patient made an informed decision to proceed with the prescribed plan of care.   Patient education material was dispensed. We will get her started on chemotherapy tomorrow I will see her on a weekly basis for further follow-up I plan minor upfront dose adjustment of cisplatin at 35 mg /m2 given hydronephrosis and mildly reduced renal function

## 2020-04-29 NOTE — Assessment & Plan Note (Signed)
She has strong family history of uterine cancer and personal history of cervical cancer I will refer her to see genetic counselor

## 2020-04-30 ENCOUNTER — Other Ambulatory Visit: Payer: Managed Care, Other (non HMO)

## 2020-04-30 ENCOUNTER — Inpatient Hospital Stay: Payer: Managed Care, Other (non HMO)

## 2020-04-30 VITALS — BP 169/75 | HR 87 | Temp 98.4°F | Resp 17

## 2020-04-30 DIAGNOSIS — C7951 Secondary malignant neoplasm of bone: Secondary | ICD-10-CM

## 2020-04-30 DIAGNOSIS — Z5111 Encounter for antineoplastic chemotherapy: Secondary | ICD-10-CM | POA: Diagnosis not present

## 2020-04-30 DIAGNOSIS — C539 Malignant neoplasm of cervix uteri, unspecified: Secondary | ICD-10-CM

## 2020-04-30 MED ORDER — SODIUM CHLORIDE 0.9 % IV SOLN
35.0000 mg/m2 | Freq: Once | INTRAVENOUS | Status: AC
  Administered 2020-04-30: 73 mg via INTRAVENOUS
  Filled 2020-04-30: qty 73

## 2020-04-30 MED ORDER — PALONOSETRON HCL INJECTION 0.25 MG/5ML
0.2500 mg | Freq: Once | INTRAVENOUS | Status: AC
  Administered 2020-04-30: 0.25 mg via INTRAVENOUS

## 2020-04-30 MED ORDER — SODIUM CHLORIDE 0.9% FLUSH
10.0000 mL | INTRAVENOUS | Status: DC | PRN
  Administered 2020-04-30: 10 mL
  Filled 2020-04-30: qty 10

## 2020-04-30 MED ORDER — PALONOSETRON HCL INJECTION 0.25 MG/5ML
INTRAVENOUS | Status: AC
  Filled 2020-04-30: qty 5

## 2020-04-30 MED ORDER — SODIUM CHLORIDE 0.9 % IV SOLN
Freq: Once | INTRAVENOUS | Status: AC
  Filled 2020-04-30: qty 250

## 2020-04-30 MED ORDER — SODIUM CHLORIDE 0.9 % IV SOLN
150.0000 mg | Freq: Once | INTRAVENOUS | Status: AC
  Administered 2020-04-30: 150 mg via INTRAVENOUS
  Filled 2020-04-30: qty 150

## 2020-04-30 MED ORDER — SODIUM CHLORIDE 0.9 % IV SOLN
Freq: Once | INTRAVENOUS | Status: AC
  Filled 2020-04-30: qty 10

## 2020-04-30 MED ORDER — SODIUM CHLORIDE 0.9 % IV SOLN
10.0000 mg | Freq: Once | INTRAVENOUS | Status: AC
  Administered 2020-04-30: 10 mg via INTRAVENOUS
  Filled 2020-04-30: qty 10

## 2020-04-30 MED ORDER — HEPARIN SOD (PORK) LOCK FLUSH 100 UNIT/ML IV SOLN
500.0000 [IU] | Freq: Once | INTRAVENOUS | Status: AC | PRN
  Administered 2020-04-30: 500 [IU]
  Filled 2020-04-30: qty 5

## 2020-04-30 NOTE — Progress Notes (Signed)
Pt discharged in no apparent distress. Pt left ambulatory without assistance. Pt aware of discharge instructions and verbalized understanding and had no further questions.  

## 2020-04-30 NOTE — Patient Instructions (Signed)
[facility] Discharge Instructions for Patients Receiving Chemotherapy  Today you received the following chemotherapy agents Cisplatin  To help prevent nausea and vomiting after your treatment, we encourage you to take your nausea medication    If you develop nausea and vomiting that is not controlled by your nausea medication, call the clinic.   BELOW ARE SYMPTOMS THAT SHOULD BE REPORTED IMMEDIATELY:  *FEVER GREATER THAN 100.5 F  *CHILLS WITH OR WITHOUT FEVER  NAUSEA AND VOMITING THAT IS NOT CONTROLLED WITH YOUR NAUSEA MEDICATION  *UNUSUAL SHORTNESS OF BREATH  *UNUSUAL BRUISING OR BLEEDING  TENDERNESS IN MOUTH AND THROAT WITH OR WITHOUT PRESENCE OF ULCERS  *URINARY PROBLEMS  *BOWEL PROBLEMS  UNUSUAL RASH Items with * indicate a potential emergency and should be followed up as soon as possible.  Feel free to call the clinic should you have any questions or concerns. The clinic phone number is [phone].  Please show the CHEMO ALERT CARD at check-in to the Emergency Department and triage nurse.   

## 2020-05-02 DIAGNOSIS — Z5111 Encounter for antineoplastic chemotherapy: Secondary | ICD-10-CM | POA: Diagnosis not present

## 2020-05-03 ENCOUNTER — Other Ambulatory Visit: Payer: Self-pay

## 2020-05-03 ENCOUNTER — Ambulatory Visit
Admission: RE | Admit: 2020-05-03 | Discharge: 2020-05-03 | Disposition: A | Discharge status: Home / Self Care | Payer: Managed Care, Other (non HMO) | Source: Ambulatory Visit | Attending: Radiation Oncology | Admitting: Radiation Oncology

## 2020-05-03 DIAGNOSIS — Z5111 Encounter for antineoplastic chemotherapy: Secondary | ICD-10-CM | POA: Diagnosis not present

## 2020-05-03 DIAGNOSIS — C539 Malignant neoplasm of cervix uteri, unspecified: Secondary | ICD-10-CM

## 2020-05-04 ENCOUNTER — Inpatient Hospital Stay: Payer: Managed Care, Other (non HMO)

## 2020-05-04 ENCOUNTER — Ambulatory Visit
Admission: RE | Admit: 2020-05-04 | Discharge: 2020-05-04 | Disposition: A | Discharge status: Home / Self Care | Payer: Managed Care, Other (non HMO) | Source: Ambulatory Visit | Attending: Radiation Oncology | Admitting: Radiation Oncology

## 2020-05-04 ENCOUNTER — Other Ambulatory Visit: Payer: Self-pay

## 2020-05-04 DIAGNOSIS — C539 Malignant neoplasm of cervix uteri, unspecified: Secondary | ICD-10-CM

## 2020-05-04 DIAGNOSIS — Z5111 Encounter for antineoplastic chemotherapy: Secondary | ICD-10-CM | POA: Diagnosis not present

## 2020-05-04 LAB — CBC WITH DIFFERENTIAL/PLATELET
Abs Immature Granulocytes: 0.02 10*3/uL (ref 0.00–0.07)
Basophils Absolute: 0 10*3/uL (ref 0.0–0.1)
Basophils Relative: 1 %
Eosinophils Absolute: 0.1 10*3/uL (ref 0.0–0.5)
Eosinophils Relative: 1 %
HCT: 34.4 % — ABNORMAL LOW (ref 36.0–46.0)
Hemoglobin: 11.4 g/dL — ABNORMAL LOW (ref 12.0–15.0)
Immature Granulocytes: 0 %
Lymphocytes Relative: 27 %
Lymphs Abs: 1.6 10*3/uL (ref 0.7–4.0)
MCH: 28.4 pg (ref 26.0–34.0)
MCHC: 33.1 g/dL (ref 30.0–36.0)
MCV: 85.6 fL (ref 80.0–100.0)
Monocytes Absolute: 0.7 10*3/uL (ref 0.1–1.0)
Monocytes Relative: 12 %
Neutro Abs: 3.6 10*3/uL (ref 1.7–7.7)
Neutrophils Relative %: 59 %
Platelets: 250 10*3/uL (ref 150–400)
RBC: 4.02 MIL/uL (ref 3.87–5.11)
RDW: 13.6 % (ref 11.5–15.5)
WBC: 6.1 10*3/uL (ref 4.0–10.5)
nRBC: 0 % (ref 0.0–0.2)

## 2020-05-04 LAB — COMPREHENSIVE METABOLIC PANEL
ALT: 12 U/L (ref 0–44)
AST: 14 U/L — ABNORMAL LOW (ref 15–41)
Albumin: 3.8 g/dL (ref 3.5–5.0)
Alkaline Phosphatase: 49 U/L (ref 38–126)
Anion gap: 7 (ref 5–15)
BUN: 27 mg/dL — ABNORMAL HIGH (ref 8–23)
CO2: 28 mmol/L (ref 22–32)
Calcium: 10.2 mg/dL (ref 8.9–10.3)
Chloride: 103 mmol/L (ref 98–111)
Creatinine, Ser: 1.31 mg/dL — ABNORMAL HIGH (ref 0.44–1.00)
GFR, Estimated: 46 mL/min — ABNORMAL LOW (ref >60)
Glucose, Bld: 82 mg/dL (ref 70–99)
Potassium: 4.1 mmol/L (ref 3.5–5.1)
Sodium: 138 mmol/L (ref 135–145)
Total Bilirubin: 0.4 mg/dL (ref 0.3–1.2)
Total Protein: 7.2 g/dL (ref 6.5–8.1)

## 2020-05-04 LAB — MAGNESIUM: Magnesium: 2.1 mg/dL (ref 1.7–2.4)

## 2020-05-05 ENCOUNTER — Other Ambulatory Visit: Payer: Self-pay

## 2020-05-05 ENCOUNTER — Ambulatory Visit
Admission: RE | Admit: 2020-05-05 | Discharge: 2020-05-05 | Disposition: A | Discharge status: Home / Self Care | Payer: Managed Care, Other (non HMO) | Source: Ambulatory Visit | Attending: Radiation Oncology | Admitting: Radiation Oncology

## 2020-05-05 ENCOUNTER — Encounter: Payer: Self-pay | Admitting: Hematology and Oncology

## 2020-05-05 ENCOUNTER — Inpatient Hospital Stay (HOSPITAL_BASED_OUTPATIENT_CLINIC_OR_DEPARTMENT_OTHER): Payer: Managed Care, Other (non HMO) | Admitting: Hematology and Oncology

## 2020-05-05 DIAGNOSIS — C7951 Secondary malignant neoplasm of bone: Secondary | ICD-10-CM | POA: Diagnosis not present

## 2020-05-05 DIAGNOSIS — C539 Malignant neoplasm of cervix uteri, unspecified: Secondary | ICD-10-CM

## 2020-05-05 DIAGNOSIS — N1330 Unspecified hydronephrosis: Secondary | ICD-10-CM

## 2020-05-05 DIAGNOSIS — F064 Anxiety disorder due to known physiological condition: Secondary | ICD-10-CM | POA: Insufficient documentation

## 2020-05-05 DIAGNOSIS — I10 Essential (primary) hypertension: Secondary | ICD-10-CM | POA: Insufficient documentation

## 2020-05-05 DIAGNOSIS — R030 Elevated blood-pressure reading, without diagnosis of hypertension: Secondary | ICD-10-CM

## 2020-05-05 DIAGNOSIS — Z5111 Encounter for antineoplastic chemotherapy: Secondary | ICD-10-CM | POA: Diagnosis not present

## 2020-05-05 MED ORDER — LORAZEPAM 0.5 MG PO TABS: 0.5000 mg | ORAL_TABLET | Freq: Three times a day (TID) | ORAL | 0 refills | Status: DC | PRN

## 2020-05-05 NOTE — Assessment & Plan Note (Signed)
She has intermittent episodes of anxiety I recommend a trial over lorazepam She is warned about risk of sedation

## 2020-05-05 NOTE — Assessment & Plan Note (Signed)
Renal function is stable We will monitor carefully

## 2020-05-05 NOTE — Assessment & Plan Note (Signed)
Overall, she tolerated treatment very well without major side effects except for very mild changes in bowel habits We will continue treatment as scheduled

## 2020-05-05 NOTE — Assessment & Plan Note (Signed)
Could be due to element of whitecoat hypertension We will monitor closely

## 2020-05-05 NOTE — Progress Notes (Signed)
[employer] OFFICE PROGRESS NOTE  Patient Care Team: [name], MD as PCP - General (Family Medicine) [name], MD as Consulting Physician (Obstetrics and Gynecology) [name], RN as Oncology Nurse Navigator (Oncology)  ASSESSMENT & PLAN:  Cervical cancer, FIGO stage IVB ([place]) Overall, she tolerated treatment very well without major side effects except for very mild changes in bowel habits We will continue treatment as scheduled  Metastasis to bone [facility]) She denies bone pain Her blood count is satisfactory  Hydronephrosis of left kidney Renal function is stable We will monitor carefully  Elevated BP without diagnosis of hypertension Could be due to element of whitecoat hypertension We will monitor closely  Anxiety disorder due to medical condition She has intermittent episodes of anxiety I recommend a trial over lorazepam She is warned about risk of sedation   No orders of the defined types were placed in this encounter.   All questions were answered. The patient knows to call the clinic with any problems, questions or concerns. The total time spent in the appointment was 20 minutes encounter with patients including review of chart and various tests results, discussions about plan of care and coordination of care plan   [name], MD 05/05/2020 1:17 PM  INTERVAL HISTORY: Please see below for problem oriented charting. She is seen and evaluated prior to cycle 2 of treatment Since last time I saw her, she tolerated treatment well She had minimum constipation after treatment and has been taking laxative on a regular basis She denies abdominal pain or vaginal bleeding No peripheral neuropathy No recent nausea She has questions related to dental clearance She has some issues related to anxiety  SUMMARY OF ONCOLOGIC HISTORY: Oncology History  Cervical cancer, FIGO stage IVB ([place])  03/16/2020 Initial Diagnosis   The patient had  history of new onset vaginal spotting on March 16, 2020.  She immediately called her OB/GYN's office and achieved an appointment with her OB/GYN's partner, Dr. Murrell Redden, who saw and evaluated the patient on March 19, 2020.  At that time a transvaginal ultrasound scan was performed which revealed a uterus measuring 5.3 x 8.4 x 4.6 cm with an ill-defined endometrium.  There is a complex cyst on the left ovary measuring 2.6 cm with peripheral blood flow noted.  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  Immunostains were positive for CEA and CDX2, negative for p16, ER, vimentin, and PAX8.  The differential diagnosis included primary gastric type endocervical adenocarcinoma (though this typically stains positive for PAX8) or a metastasis from pancreaticobiliary or upper GI primary.   03/29/2020 Pathology Results   1. Surgical [P], duodenum - PEPTIC DUODENITIS. - NO DYSPLASIA OR MALIGNANCY. 2. Surgical [P], gastric antrum and gastric body - REACTIVE GASTROPATHY. Hinton Dyer IS NEGATIVE FOR HELICOBACTER PYLORI. - NO INTESTINAL METAPLASIA, DYSPLASIA, OR MALIGNANCY. 3. Surgical [P], gastric nodule - MILD REACTIVE GASTROPATHY. - NO INTESTINAL METAPLASIA, DYSPLASIA, OR MALIGNANCY. 4. Surgical [P], stomach, lesser curve ulcers - REACTIVE GASTROPATHY WITH EROSIONS. - NO INTESTINAL METAPLASIA, DYSPLASIA, OR MALIGNANCY. 5. Surgical [P], gastric polyps - HYPERPLASTIC POLYP. - NO INTESTINAL METAPLASIA, DYSPLASIA OR MALIGNANCY. 6. Surgical [P], colon, descending, polyp - HYPERPLASTIC POLYP. - NO DYSPLASIA OR MALIGNANCY.   04/06/2020 Procedure   EGD report  Normal esophagus. - A few gastric polyps. Suspected fundic gland polyps. - Non-bleeding gastric ulcers with pigmented material. Biopsied. - A single gastric polyp/nodule. Resected  and retrieved. - Erythematous duodenopathy.  Biopsied. - The examination was otherwise normal.   04/06/2020 Procedure   Colonoscopy report - Non-bleeding internal hemorrhoids. - One 4 mm polyp in the descending colon, removed with a cold snare. Resected and retrieved. - The examination was otherwise normal on direct and retroflexion views   04/19/2020 PET scan   1. Hypermetabolic uterine cervix mass compatible with known primary cervical malignancy, with hypermetabolism extending into the upper third of the vagina and extending throughout the uterine body. No frank extrauterine extension. 2. Mild to moderate left hydroureteronephrosis to the level of the left UVJ, concerning for tumor involvement of the left UVJ. 3. Hypermetabolic right common iliac nodal metastasis. 4. Hypermetabolic sclerotic right ischial bone metastasis. 5. Two indeterminate small scattered solid pulmonary nodules, below PET resolution, warranting attention on chest CT follow-up in 3 months. 6.  Aortic Atherosclerosis (ICD10-I70.0).   04/19/2020 Cancer Staging   Staging form: Cervix Uteri, AJCC Version 9 - Clinical stage from 04/19/2020: FIGO Stage IVB (cT3b, cN1a, cM1) - Signed by [name], MD on 04/19/2020   04/26/2020 Procedure   Successful placement of a right IJ approach Power Port with ultrasound and fluoroscopic guidance. The catheter is ready for use   04/30/2020 -  Chemotherapy   The patient had cisplatin for chemotherapy treatment.     Metastasis to bone ([place])  04/19/2020 Initial Diagnosis   Metastasis to bone ([facility])     REVIEW OF SYSTEMS:   Constitutional: Denies fevers, chills or abnormal weight loss Eyes: Denies blurriness of vision Ears, nose, mouth, throat, and face: Denies mucositis or sore throat Respiratory: Denies cough, dyspnea or wheezes Cardiovascular: Denies palpitation, chest discomfort or lower extremity swelling Skin: Denies abnormal skin rashes Lymphatics: Denies new lymphadenopathy or easy bruising Neurological:Denies  numbness, tingling or new weaknesses Behavioral/Psych: Mood is stable, no new changes  All other systems were reviewed with the patient and are negative.  I have reviewed the past medical history, past surgical history, social history and family history with the patient and they are unchanged from previous note.  ALLERGIES:  is allergic to penicillins.  MEDICATIONS:  Current Outpatient Medications  Medication Sig Dispense Refill  . Cholecalciferol (VITAMIN D3) 2000 units TABS Take 2 tablets by mouth daily.    . clobetasol cream (TEMOVATE) 0.05 % Apply topically. Left leg    . fenofibrate 160 MG tablet TAKE 1 TABLET BY MOUTH EVERY DAY 30 tablet 5  . hydroxychloroquine (PLAQUENIL) 200 MG tablet 2 TABLET WITH FOOD OR MILK ONCE A DAY ORALLY 30  2  . lidocaine-prilocaine (EMLA) cream Apply to affected area once 30 g 3  . LORazepam (ATIVAN) 0.5 MG tablet Take 1 tablet (0.5 mg total) by mouth every 8 (eight) hours as needed for anxiety. 30 tablet 0  . ondansetron (ZOFRAN) 8 MG tablet Take 1 tablet (8 mg total) by mouth every 8 (eight) hours as needed. Start on the third day after cisplatin chemotherapy. 30 tablet 1  . pantoprazole (PROTONIX) 40 MG tablet Take 1 tablet (40 mg total) by mouth 2 (two) times daily. 90 tablet 3  . prochlorperazine (COMPAZINE) 10 MG tablet Take 1 tablet (10 mg total) by mouth every 6 (six) hours as needed (Nausea or vomiting). 30 tablet 1   No current facility-administered medications for this visit.    PHYSICAL EXAMINATION: ECOG PERFORMANCE STATUS: 1 - Symptomatic but completely ambulatory  Vitals:   05/05/20 1204  BP: (!) 163/84  Pulse: 75  Resp: 18  Temp: 98.2  F (36.8 C)  SpO2: 98%   Filed Weights   05/05/20 1204  Weight: 207 lb 9.6 oz (94.2 kg)    GENERAL:alert, no distress and comfortable NEURO: alert & oriented x 3 with fluent speech, no focal motor/sensory deficits  LABORATORY DATA:  I have reviewed the data as listed    Component Value  Date/Time   NA 138 05/04/2020 1143   NA 140 06/21/2016 1530   K 4.1 05/04/2020 1143   CL 103 05/04/2020 1143   CO2 28 05/04/2020 1143   GLUCOSE 82 05/04/2020 1143   BUN 27 (H) 05/04/2020 1143   BUN 16 06/21/2016 1530   CREATININE 1.31 (H) 05/04/2020 1143   CREATININE 0.87 07/02/2017 1549   CALCIUM 10.2 05/04/2020 1143   PROT 7.2 05/04/2020 1143   PROT 7.3 06/21/2016 1530   ALBUMIN 3.8 05/04/2020 1143   ALBUMIN 4.3 06/21/2016 1530   AST 14 (L) 05/04/2020 1143   ALT 12 05/04/2020 1143   ALKPHOS 49 05/04/2020 1143   BILITOT 0.4 05/04/2020 1143   BILITOT 0.3 06/21/2016 1530   GFRNONAA 46 (L) 05/04/2020 1143   GFRAA 111 06/21/2016 1530    No results found for: SPEP, UPEP  Lab Results  Component Value Date   WBC 6.1 05/04/2020   NEUTROABS 3.6 05/04/2020   HGB 11.4 (L) 05/04/2020   HCT 34.4 (L) 05/04/2020   MCV 85.6 05/04/2020   PLT 250 05/04/2020      Chemistry      Component Value Date/Time   NA 138 05/04/2020 1143   NA 140 06/21/2016 1530   K 4.1 05/04/2020 1143   CL 103 05/04/2020 1143   CO2 28 05/04/2020 1143   BUN 27 (H) 05/04/2020 1143   BUN 16 06/21/2016 1530   CREATININE 1.31 (H) 05/04/2020 1143   CREATININE 0.87 07/02/2017 1549      Component Value Date/Time   CALCIUM 10.2 05/04/2020 1143   ALKPHOS 49 05/04/2020 1143   AST 14 (L) 05/04/2020 1143   ALT 12 05/04/2020 1143   BILITOT 0.4 05/04/2020 1143   BILITOT 0.3 06/21/2016 1530

## 2020-05-05 NOTE — Assessment & Plan Note (Signed)
She denies bone pain Her blood count is satisfactory

## 2020-05-06 ENCOUNTER — Ambulatory Visit
Admission: RE | Admit: 2020-05-06 | Discharge: 2020-05-06 | Disposition: A | Discharge status: Home / Self Care | Payer: Managed Care, Other (non HMO) | Source: Ambulatory Visit | Attending: Radiation Oncology | Admitting: Radiation Oncology

## 2020-05-06 DIAGNOSIS — Z5111 Encounter for antineoplastic chemotherapy: Secondary | ICD-10-CM | POA: Diagnosis not present

## 2020-05-07 ENCOUNTER — Other Ambulatory Visit: Payer: Managed Care, Other (non HMO)

## 2020-05-07 ENCOUNTER — Inpatient Hospital Stay: Payer: Managed Care, Other (non HMO)

## 2020-05-07 ENCOUNTER — Ambulatory Visit
Admission: RE | Admit: 2020-05-07 | Discharge: 2020-05-07 | Disposition: A | Discharge status: Home / Self Care | Payer: Managed Care, Other (non HMO) | Source: Ambulatory Visit | Attending: Radiation Oncology | Admitting: Radiation Oncology

## 2020-05-07 ENCOUNTER — Other Ambulatory Visit: Payer: Self-pay

## 2020-05-07 VITALS — BP 159/88 | HR 79 | Temp 98.2°F | Resp 16

## 2020-05-07 DIAGNOSIS — Z5111 Encounter for antineoplastic chemotherapy: Secondary | ICD-10-CM | POA: Diagnosis not present

## 2020-05-07 DIAGNOSIS — C7951 Secondary malignant neoplasm of bone: Secondary | ICD-10-CM

## 2020-05-07 DIAGNOSIS — C539 Malignant neoplasm of cervix uteri, unspecified: Secondary | ICD-10-CM

## 2020-05-07 MED ORDER — HEPARIN SOD (PORK) LOCK FLUSH 100 UNIT/ML IV SOLN
500.0000 [IU] | Freq: Once | INTRAVENOUS | Status: AC | PRN
  Administered 2020-05-07: 500 [IU]
  Filled 2020-05-07: qty 5

## 2020-05-07 MED ORDER — SODIUM CHLORIDE 0.9 % IV SOLN
Freq: Once | INTRAVENOUS | Status: AC
  Filled 2020-05-07: qty 250

## 2020-05-07 MED ORDER — SODIUM CHLORIDE 0.9% FLUSH
10.0000 mL | INTRAVENOUS | Status: DC | PRN
  Administered 2020-05-07: 10 mL
  Filled 2020-05-07: qty 10

## 2020-05-07 MED ORDER — SODIUM CHLORIDE 0.9 % IV SOLN
Freq: Once | INTRAVENOUS | Status: AC
  Filled 2020-05-07: qty 10

## 2020-05-07 MED ORDER — PALONOSETRON HCL INJECTION 0.25 MG/5ML
0.2500 mg | Freq: Once | INTRAVENOUS | Status: AC
  Administered 2020-05-07: 0.25 mg via INTRAVENOUS

## 2020-05-07 MED ORDER — PALONOSETRON HCL INJECTION 0.25 MG/5ML
INTRAVENOUS | Status: AC
  Filled 2020-05-07: qty 5

## 2020-05-07 MED ORDER — SODIUM CHLORIDE 0.9 % IV SOLN
10.0000 mg | Freq: Once | INTRAVENOUS | Status: AC
  Administered 2020-05-07: 10 mg via INTRAVENOUS
  Filled 2020-05-07: qty 10

## 2020-05-07 MED ORDER — SODIUM CHLORIDE 0.9 % IV SOLN
150.0000 mg | Freq: Once | INTRAVENOUS | Status: AC
  Administered 2020-05-07: 150 mg via INTRAVENOUS
  Filled 2020-05-07: qty 150

## 2020-05-07 MED ORDER — SODIUM CHLORIDE 0.9 % IV SOLN
35.0000 mg/m2 | Freq: Once | INTRAVENOUS | Status: AC
  Administered 2020-05-07: 73 mg via INTRAVENOUS
  Filled 2020-05-07: qty 73

## 2020-05-07 NOTE — Patient Instructions (Signed)
[facility] Discharge Instructions for Patients Receiving Chemotherapy  Today you received the following chemotherapy agents Cisplatin  To help prevent nausea and vomiting after your treatment, we encourage you to take your nausea medication    If you develop nausea and vomiting that is not controlled by your nausea medication, call the clinic.   BELOW ARE SYMPTOMS THAT SHOULD BE REPORTED IMMEDIATELY:  *FEVER GREATER THAN 100.5 F  *CHILLS WITH OR WITHOUT FEVER  NAUSEA AND VOMITING THAT IS NOT CONTROLLED WITH YOUR NAUSEA MEDICATION  *UNUSUAL SHORTNESS OF BREATH  *UNUSUAL BRUISING OR BLEEDING  TENDERNESS IN MOUTH AND THROAT WITH OR WITHOUT PRESENCE OF ULCERS  *URINARY PROBLEMS  *BOWEL PROBLEMS  UNUSUAL RASH Items with * indicate a potential emergency and should be followed up as soon as possible.  Feel free to call the clinic should you have any questions or concerns. The clinic phone number is [phone].  Please show the CHEMO ALERT CARD at check-in to the Emergency Department and triage nurse.   

## 2020-05-09 ENCOUNTER — Ambulatory Visit: Payer: Managed Care, Other (non HMO)

## 2020-05-10 ENCOUNTER — Ambulatory Visit
Admission: RE | Admit: 2020-05-10 | Discharge: 2020-05-10 | Disposition: A | Discharge status: Home / Self Care | Payer: Managed Care, Other (non HMO) | Source: Ambulatory Visit | Attending: Radiation Oncology | Admitting: Radiation Oncology

## 2020-05-10 DIAGNOSIS — Z5111 Encounter for antineoplastic chemotherapy: Secondary | ICD-10-CM | POA: Diagnosis not present

## 2020-05-11 ENCOUNTER — Ambulatory Visit
Admission: RE | Admit: 2020-05-11 | Discharge: 2020-05-11 | Disposition: A | Discharge status: Home / Self Care | Payer: Managed Care, Other (non HMO) | Source: Ambulatory Visit | Attending: Radiation Oncology | Admitting: Radiation Oncology

## 2020-05-11 ENCOUNTER — Inpatient Hospital Stay: Payer: Managed Care, Other (non HMO)

## 2020-05-11 ENCOUNTER — Other Ambulatory Visit: Payer: Self-pay

## 2020-05-11 DIAGNOSIS — Z5111 Encounter for antineoplastic chemotherapy: Secondary | ICD-10-CM | POA: Diagnosis not present

## 2020-05-11 DIAGNOSIS — C539 Malignant neoplasm of cervix uteri, unspecified: Secondary | ICD-10-CM

## 2020-05-11 LAB — CBC WITH DIFFERENTIAL/PLATELET
Abs Immature Granulocytes: 0.01 10*3/uL (ref 0.00–0.07)
Basophils Absolute: 0 10*3/uL (ref 0.0–0.1)
Basophils Relative: 0 %
Eosinophils Absolute: 0.1 10*3/uL (ref 0.0–0.5)
Eosinophils Relative: 2 %
HCT: 33.6 % — ABNORMAL LOW (ref 36.0–46.0)
Hemoglobin: 11 g/dL — ABNORMAL LOW (ref 12.0–15.0)
Immature Granulocytes: 0 %
Lymphocytes Relative: 21 %
Lymphs Abs: 1 10*3/uL (ref 0.7–4.0)
MCH: 28.3 pg (ref 26.0–34.0)
MCHC: 32.7 g/dL (ref 30.0–36.0)
MCV: 86.4 fL (ref 80.0–100.0)
Monocytes Absolute: 0.4 10*3/uL (ref 0.1–1.0)
Monocytes Relative: 9 %
Neutro Abs: 3 10*3/uL (ref 1.7–7.7)
Neutrophils Relative %: 68 %
Platelets: 178 10*3/uL (ref 150–400)
RBC: 3.89 MIL/uL (ref 3.87–5.11)
RDW: 13.8 % (ref 11.5–15.5)
WBC: 4.5 10*3/uL (ref 4.0–10.5)
nRBC: 0 % (ref 0.0–0.2)

## 2020-05-11 LAB — COMPREHENSIVE METABOLIC PANEL
ALT: 18 U/L (ref 0–44)
AST: 15 U/L (ref 15–41)
Albumin: 4 g/dL (ref 3.5–5.0)
Alkaline Phosphatase: 56 U/L (ref 38–126)
Anion gap: 10 (ref 5–15)
BUN: 23 mg/dL (ref 8–23)
CO2: 27 mmol/L (ref 22–32)
Calcium: 9.7 mg/dL (ref 8.9–10.3)
Chloride: 102 mmol/L (ref 98–111)
Creatinine, Ser: 1.16 mg/dL — ABNORMAL HIGH (ref 0.44–1.00)
GFR, Estimated: 53 mL/min — ABNORMAL LOW (ref >60)
Glucose, Bld: 96 mg/dL (ref 70–99)
Potassium: 4 mmol/L (ref 3.5–5.1)
Sodium: 139 mmol/L (ref 135–145)
Total Bilirubin: 0.4 mg/dL (ref 0.3–1.2)
Total Protein: 7.3 g/dL (ref 6.5–8.1)

## 2020-05-11 LAB — MAGNESIUM: Magnesium: 2 mg/dL (ref 1.7–2.4)

## 2020-05-12 ENCOUNTER — Ambulatory Visit
Admission: RE | Admit: 2020-05-12 | Discharge: 2020-05-12 | Disposition: A | Discharge status: Home / Self Care | Payer: Managed Care, Other (non HMO) | Source: Ambulatory Visit | Attending: Radiation Oncology | Admitting: Radiation Oncology

## 2020-05-12 ENCOUNTER — Inpatient Hospital Stay (HOSPITAL_BASED_OUTPATIENT_CLINIC_OR_DEPARTMENT_OTHER): Payer: Managed Care, Other (non HMO) | Admitting: Hematology and Oncology

## 2020-05-12 ENCOUNTER — Other Ambulatory Visit: Payer: Self-pay | Admitting: Hematology and Oncology

## 2020-05-12 ENCOUNTER — Other Ambulatory Visit: Payer: Self-pay

## 2020-05-12 ENCOUNTER — Encounter: Payer: Self-pay | Admitting: Hematology and Oncology

## 2020-05-12 DIAGNOSIS — F064 Anxiety disorder due to known physiological condition: Secondary | ICD-10-CM | POA: Diagnosis not present

## 2020-05-12 DIAGNOSIS — M25559 Pain in unspecified hip: Secondary | ICD-10-CM

## 2020-05-12 DIAGNOSIS — C539 Malignant neoplasm of cervix uteri, unspecified: Secondary | ICD-10-CM | POA: Diagnosis not present

## 2020-05-12 DIAGNOSIS — C7951 Secondary malignant neoplasm of bone: Secondary | ICD-10-CM | POA: Diagnosis not present

## 2020-05-12 DIAGNOSIS — N1330 Unspecified hydronephrosis: Secondary | ICD-10-CM

## 2020-05-12 DIAGNOSIS — Z5111 Encounter for antineoplastic chemotherapy: Secondary | ICD-10-CM | POA: Diagnosis not present

## 2020-05-12 MED ORDER — TRAMADOL HCL 50 MG PO TABS
100.0000 mg | ORAL_TABLET | Freq: Four times a day (QID) | ORAL | 0 refills | Status: DC | PRN
Start: 2020-05-12 — End: 2020-05-12

## 2020-05-12 MED FILL — traMADol HCL 50 MG TABS: 50 | 8 days supply | Qty: 60 | Fill #0

## 2020-05-12 NOTE — Assessment & Plan Note (Signed)
Renal function has improved She will continue increase fluid hydration as tolerated

## 2020-05-12 NOTE — Assessment & Plan Note (Signed)
She is feeling much better with intermittent doses of lorazepam as needed

## 2020-05-12 NOTE — Assessment & Plan Note (Signed)
She had recent flare of joint pain I recommend trial of acetaminophen with tramadol.

## 2020-05-12 NOTE — Assessment & Plan Note (Signed)
Overall, she tolerated treatment very well without major side effects except for very mild changes in bowel habits, some nausea and recent joint pain We will continue treatment as scheduled

## 2020-05-12 NOTE — Progress Notes (Signed)
[facility] OFFICE PROGRESS NOTE  Patient Care Team: [name], MD as PCP - General (Family Medicine) [name], MD as Consulting Physician (Obstetrics and Gynecology) [name], RN as Oncology Nurse Navigator (Oncology)  ASSESSMENT & PLAN:  Cervical cancer, FIGO stage IVB ([place]) Overall, she tolerated treatment very well without major side effects except for very mild changes in bowel habits, some nausea and recent joint pain We will continue treatment as scheduled  Metastasis to bone [facility]) She had recent flare of joint pain I recommend trial of acetaminophen with tramadol.  Anxiety disorder due to medical condition She is feeling much better with intermittent doses of lorazepam as needed  Hydronephrosis of left kidney Renal function has improved She will continue increase fluid hydration as tolerated   No orders of the defined types were placed in this encounter.   All questions were answered. The patient knows to call the clinic with any problems, questions or concerns. The total time spent in the appointment was 20 minutes encounter with patients including review of chart and various tests results, discussions about plan of care and coordination of care plan   [name], MD 05/12/2020 3:38 PM  INTERVAL HISTORY: Please see below for problem oriented charting. She returns to be seen prior to treatment She tolerated last cycle well except for very mild nausea, flare of joint pain and intermittent diarrhea Denies peripheral neuropathy She is wondering about genetics referral  SUMMARY OF ONCOLOGIC HISTORY: Oncology History  Cervical cancer, FIGO stage IVB ([place])  03/16/2020 Initial Diagnosis   The patient had history of new onset vaginal spotting on March 16, 2020.  She immediately called her OB/GYN's office and achieved an appointment with her OB/GYN's partner, Dr. Murrell Redden, who saw and evaluated the patient on March 19, 2020.  At that  time a transvaginal ultrasound scan was performed which revealed a uterus measuring 5.3 x 8.4 x 4.6 cm with an ill-defined endometrium.  There is a complex cyst on the left ovary measuring 2.6 cm with peripheral blood flow noted.  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  Immunostains were positive for CEA and CDX2, negative for p16, ER, vimentin, and PAX8.  The differential diagnosis included primary gastric type endocervical adenocarcinoma (though this typically stains positive for PAX8) or a metastasis from pancreaticobiliary or upper GI primary.   03/29/2020 Pathology Results   1. Surgical [P], duodenum - PEPTIC DUODENITIS. - NO DYSPLASIA OR MALIGNANCY. 2. Surgical [P], gastric antrum and gastric body - REACTIVE GASTROPATHY. Hinton Dyer IS NEGATIVE FOR HELICOBACTER PYLORI. - NO INTESTINAL METAPLASIA, DYSPLASIA, OR MALIGNANCY. 3. Surgical [P], gastric nodule - MILD REACTIVE GASTROPATHY. - NO INTESTINAL METAPLASIA, DYSPLASIA, OR MALIGNANCY. 4. Surgical [P], stomach, lesser curve ulcers - REACTIVE GASTROPATHY WITH EROSIONS. - NO INTESTINAL METAPLASIA, DYSPLASIA, OR MALIGNANCY. 5. Surgical [P], gastric polyps - HYPERPLASTIC POLYP. - NO INTESTINAL METAPLASIA, DYSPLASIA OR MALIGNANCY. 6. Surgical [P], colon, descending, polyp - HYPERPLASTIC POLYP. - NO DYSPLASIA OR MALIGNANCY.   04/06/2020 Procedure   EGD report  Normal esophagus. - A few gastric polyps. Suspected fundic gland polyps. - Non-bleeding gastric ulcers with pigmented material. Biopsied. - A single gastric polyp/nodule. Resected and retrieved. - Erythematous duodenopathy. Biopsied. - The examination was otherwise normal.   04/06/2020 Procedure   Colonoscopy report - Non-bleeding internal hemorrhoids. - One 4 mm polyp in the descending colon, removed with a cold snare. Resected and  retrieved. - The  examination was otherwise normal on direct and retroflexion views   04/19/2020 PET scan   1. Hypermetabolic uterine cervix mass compatible with known primary cervical malignancy, with hypermetabolism extending into the upper third of the vagina and extending throughout the uterine body. No frank extrauterine extension. 2. Mild to moderate left hydroureteronephrosis to the level of the left UVJ, concerning for tumor involvement of the left UVJ. 3. Hypermetabolic right common iliac nodal metastasis. 4. Hypermetabolic sclerotic right ischial bone metastasis. 5. Two indeterminate small scattered solid pulmonary nodules, below PET resolution, warranting attention on chest CT follow-up in 3 months. 6.  Aortic Atherosclerosis (ICD10-I70.0).   04/19/2020 Cancer Staging   Staging form: Cervix Uteri, AJCC Version 9 - Clinical stage from 04/19/2020: FIGO Stage IVB (cT3b, cN1a, cM1) - Signed by [name], MD on 04/19/2020   04/26/2020 Procedure   Successful placement of a right IJ approach Power Port with ultrasound and fluoroscopic guidance. The catheter is ready for use   04/30/2020 -  Chemotherapy   The patient had cisplatin for chemotherapy treatment.     Metastasis to bone ([place])  04/19/2020 Initial Diagnosis   Metastasis to bone ([facility])     REVIEW OF SYSTEMS:   Constitutional: Denies fevers, chills or abnormal weight loss Eyes: Denies blurriness of vision Ears, nose, mouth, throat, and face: Denies mucositis or sore throat Respiratory: Denies cough, dyspnea or wheezes Cardiovascular: Denies palpitation, chest discomfort or lower extremity swelling Skin: Denies abnormal skin rashes Lymphatics: Denies new lymphadenopathy or easy bruising Neurological:Denies numbness, tingling or new weaknesses Behavioral/Psych: Mood is stable, no new changes  All other systems were reviewed with the patient and are negative.  I have reviewed the past medical history, past surgical history, social history and  family history with the patient and they are unchanged from previous note.  ALLERGIES:  is allergic to penicillins.  MEDICATIONS:  Current Outpatient Medications  Medication Sig Dispense Refill  . Cholecalciferol (VITAMIN D3) 2000 units TABS Take 2 tablets by mouth daily.    . clobetasol cream (TEMOVATE) 0.05 % Apply topically. Left leg    . fenofibrate 160 MG tablet TAKE 1 TABLET BY MOUTH EVERY DAY 30 tablet 5  . hydroxychloroquine (PLAQUENIL) 200 MG tablet 2 TABLET WITH FOOD OR MILK ONCE A DAY ORALLY 30  2  . lidocaine-prilocaine (EMLA) cream Apply to affected area once 30 g 3  . LORazepam (ATIVAN) 0.5 MG tablet Take 1 tablet (0.5 mg total) by mouth every 8 (eight) hours as needed for anxiety. 30 tablet 0  . ondansetron (ZOFRAN) 8 MG tablet Take 1 tablet (8 mg total) by mouth every 8 (eight) hours as needed. Start on the third day after cisplatin chemotherapy. 30 tablet 1  . pantoprazole (PROTONIX) 40 MG tablet Take 1 tablet (40 mg total) by mouth 2 (two) times daily. 90 tablet 3  . prochlorperazine (COMPAZINE) 10 MG tablet Take 1 tablet (10 mg total) by mouth every 6 (six) hours as needed (Nausea or vomiting). 30 tablet 1  . traMADol (ULTRAM) 50 MG tablet Take 2 tablets (100 mg total) by mouth every 6 (six) hours as needed. 60 tablet 0   No current facility-administered medications for this visit.    PHYSICAL EXAMINATION: ECOG PERFORMANCE STATUS: 1 - Symptomatic but completely ambulatory  Vitals:   05/12/20 1207  BP: 139/80  Pulse: 82  Resp: 16  Temp: 98.4 F (36.9 C)  SpO2: 99%   Filed Weights   05/12/20 1207  Weight: 207  lb 3.2 oz (94 kg)    GENERAL:alert, no distress and comfortable NEURO: alert & oriented x 3 with fluent speech, no focal motor/sensory deficits  LABORATORY DATA:  I have reviewed the data as listed    Component Value Date/Time   NA 139 05/11/2020 1259   NA 140 06/21/2016 1530   K 4.0 05/11/2020 1259   CL 102 05/11/2020 1259   CO2 27 05/11/2020  1259   GLUCOSE 96 05/11/2020 1259   BUN 23 05/11/2020 1259   BUN 16 06/21/2016 1530   CREATININE 1.16 (H) 05/11/2020 1259   CREATININE 0.87 07/02/2017 1549   CALCIUM 9.7 05/11/2020 1259   PROT 7.3 05/11/2020 1259   PROT 7.3 06/21/2016 1530   ALBUMIN 4.0 05/11/2020 1259   ALBUMIN 4.3 06/21/2016 1530   AST 15 05/11/2020 1259   ALT 18 05/11/2020 1259   ALKPHOS 56 05/11/2020 1259   BILITOT 0.4 05/11/2020 1259   BILITOT 0.3 06/21/2016 1530   GFRNONAA 53 (L) 05/11/2020 1259   GFRAA 111 06/21/2016 1530    No results found for: SPEP, UPEP  Lab Results  Component Value Date   WBC 4.5 05/11/2020   NEUTROABS 3.0 05/11/2020   HGB 11.0 (L) 05/11/2020   HCT 33.6 (L) 05/11/2020   MCV 86.4 05/11/2020   PLT 178 05/11/2020      Chemistry      Component Value Date/Time   NA 139 05/11/2020 1259   NA 140 06/21/2016 1530   K 4.0 05/11/2020 1259   CL 102 05/11/2020 1259   CO2 27 05/11/2020 1259   BUN 23 05/11/2020 1259   BUN 16 06/21/2016 1530   CREATININE 1.16 (H) 05/11/2020 1259   CREATININE 0.87 07/02/2017 1549      Component Value Date/Time   CALCIUM 9.7 05/11/2020 1259   ALKPHOS 56 05/11/2020 1259   AST 15 05/11/2020 1259   ALT 18 05/11/2020 1259   BILITOT 0.4 05/11/2020 1259   BILITOT 0.3 06/21/2016 1530

## 2020-05-14 ENCOUNTER — Other Ambulatory Visit: Payer: Managed Care, Other (non HMO)

## 2020-05-14 ENCOUNTER — Telehealth: Payer: Self-pay | Admitting: *Deleted

## 2020-05-14 ENCOUNTER — Inpatient Hospital Stay: Payer: Managed Care, Other (non HMO)

## 2020-05-14 ENCOUNTER — Other Ambulatory Visit: Payer: Self-pay | Admitting: Hematology and Oncology

## 2020-05-14 ENCOUNTER — Other Ambulatory Visit: Payer: Self-pay

## 2020-05-14 VITALS — BP 159/88 | HR 76 | Temp 98.1°F | Resp 18

## 2020-05-14 DIAGNOSIS — Z809 Family history of malignant neoplasm, unspecified: Secondary | ICD-10-CM

## 2020-05-14 DIAGNOSIS — C7951 Secondary malignant neoplasm of bone: Secondary | ICD-10-CM

## 2020-05-14 DIAGNOSIS — C539 Malignant neoplasm of cervix uteri, unspecified: Secondary | ICD-10-CM

## 2020-05-14 DIAGNOSIS — Z5111 Encounter for antineoplastic chemotherapy: Secondary | ICD-10-CM | POA: Diagnosis not present

## 2020-05-14 MED ORDER — SODIUM CHLORIDE 0.9 % IV SOLN
35.0000 mg/m2 | Freq: Once | INTRAVENOUS | Status: AC
  Administered 2020-05-14: 73 mg via INTRAVENOUS
  Filled 2020-05-14: qty 73

## 2020-05-14 MED ORDER — PALONOSETRON HCL INJECTION 0.25 MG/5ML
0.2500 mg | Freq: Once | INTRAVENOUS | Status: AC
  Administered 2020-05-14: 0.25 mg via INTRAVENOUS

## 2020-05-14 MED ORDER — SODIUM CHLORIDE 0.9% FLUSH
10.0000 mL | INTRAVENOUS | Status: DC | PRN
  Administered 2020-05-14: 10 mL
  Filled 2020-05-14: qty 10

## 2020-05-14 MED ORDER — SODIUM CHLORIDE 0.9 % IV SOLN
Freq: Once | INTRAVENOUS | Status: AC
  Filled 2020-05-14: qty 10

## 2020-05-14 MED ORDER — HEPARIN SOD (PORK) LOCK FLUSH 100 UNIT/ML IV SOLN
500.0000 [IU] | Freq: Once | INTRAVENOUS | Status: AC | PRN
  Administered 2020-05-14: 500 [IU]
  Filled 2020-05-14: qty 5

## 2020-05-14 MED ORDER — SODIUM CHLORIDE 0.9 % IV SOLN
150.0000 mg | Freq: Once | INTRAVENOUS | Status: AC
  Administered 2020-05-14: 150 mg via INTRAVENOUS
  Filled 2020-05-14: qty 150

## 2020-05-14 MED ORDER — SODIUM CHLORIDE 0.9 % IV SOLN
Freq: Once | INTRAVENOUS | Status: AC
  Filled 2020-05-14: qty 250

## 2020-05-14 MED ORDER — PALONOSETRON HCL INJECTION 0.25 MG/5ML
INTRAVENOUS | Status: AC
  Filled 2020-05-14: qty 5

## 2020-05-14 MED ORDER — SODIUM CHLORIDE 0.9 % IV SOLN
10.0000 mg | Freq: Once | INTRAVENOUS | Status: AC
  Administered 2020-05-14: 10 mg via INTRAVENOUS
  Filled 2020-05-14: qty 10

## 2020-05-14 NOTE — Patient Instructions (Signed)
[facility] Discharge Instructions for Patients Receiving Chemotherapy  Today you received the following chemotherapy agent: Cisplatin To help prevent nausea and vomiting after your treatment, we encourage you to take your nausea medication    If you develop nausea and vomiting that is not controlled by your nausea medication, call the clinic.   BELOW ARE SYMPTOMS THAT SHOULD BE REPORTED IMMEDIATELY:  *FEVER GREATER THAN 100.5 F  *CHILLS WITH OR WITHOUT FEVER  NAUSEA AND VOMITING THAT IS NOT CONTROLLED WITH YOUR NAUSEA MEDICATION  *UNUSUAL SHORTNESS OF BREATH  *UNUSUAL BRUISING OR BLEEDING  TENDERNESS IN MOUTH AND THROAT WITH OR WITHOUT PRESENCE OF ULCERS  *URINARY PROBLEMS  *BOWEL PROBLEMS  UNUSUAL RASH Items with * indicate a potential emergency and should be followed up as soon as possible.  Feel free to call the clinic should you have any questions or concerns. The clinic phone number is [phone].  Please show the [facility] at check-in to the Emergency Department and triage nurse.

## 2020-05-14 NOTE — Telephone Encounter (Signed)
-----   Message from [name], MD sent at 05/14/2020  9:09 AM EST ----- Regarding: pls let her know I have send referral for genetics.

## 2020-05-14 NOTE — Telephone Encounter (Signed)
Notified pt of genetic referral per Dr Alvy Bimler. Pt expressed appreciation.

## 2020-05-14 NOTE — Progress Notes (Signed)
Pt ambulatory, steady gait, tolerated treatment well, stable at d/c.

## 2020-05-17 ENCOUNTER — Ambulatory Visit
Admission: RE | Admit: 2020-05-17 | Discharge: 2020-05-17 | Disposition: A | Discharge status: Home / Self Care | Payer: Managed Care, Other (non HMO) | Source: Ambulatory Visit | Attending: Radiation Oncology | Admitting: Radiation Oncology

## 2020-05-17 DIAGNOSIS — Z5111 Encounter for antineoplastic chemotherapy: Secondary | ICD-10-CM | POA: Diagnosis not present

## 2020-05-18 ENCOUNTER — Other Ambulatory Visit: Payer: Self-pay

## 2020-05-18 ENCOUNTER — Inpatient Hospital Stay: Payer: Managed Care, Other (non HMO)

## 2020-05-18 ENCOUNTER — Ambulatory Visit
Admission: RE | Admit: 2020-05-18 | Discharge: 2020-05-18 | Disposition: A | Discharge status: Home / Self Care | Payer: Managed Care, Other (non HMO) | Source: Ambulatory Visit | Attending: Radiation Oncology | Admitting: Radiation Oncology

## 2020-05-18 DIAGNOSIS — Z5111 Encounter for antineoplastic chemotherapy: Secondary | ICD-10-CM | POA: Diagnosis not present

## 2020-05-18 DIAGNOSIS — C539 Malignant neoplasm of cervix uteri, unspecified: Secondary | ICD-10-CM

## 2020-05-18 LAB — CBC WITH DIFFERENTIAL/PLATELET
Abs Immature Granulocytes: 0.01 10*3/uL (ref 0.00–0.07)
Basophils Absolute: 0 10*3/uL (ref 0.0–0.1)
Basophils Relative: 0 %
Eosinophils Absolute: 0.1 10*3/uL (ref 0.0–0.5)
Eosinophils Relative: 3 %
HCT: 31.2 % — ABNORMAL LOW (ref 36.0–46.0)
Hemoglobin: 10.5 g/dL — ABNORMAL LOW (ref 12.0–15.0)
Immature Granulocytes: 0 %
Lymphocytes Relative: 21 %
Lymphs Abs: 0.8 10*3/uL (ref 0.7–4.0)
MCH: 28.4 pg (ref 26.0–34.0)
MCHC: 33.7 g/dL (ref 30.0–36.0)
MCV: 84.3 fL (ref 80.0–100.0)
Monocytes Absolute: 0.4 10*3/uL (ref 0.1–1.0)
Monocytes Relative: 11 %
Neutro Abs: 2.4 10*3/uL (ref 1.7–7.7)
Neutrophils Relative %: 65 %
Platelets: 119 10*3/uL — ABNORMAL LOW (ref 150–400)
RBC: 3.7 MIL/uL — ABNORMAL LOW (ref 3.87–5.11)
RDW: 13.9 % (ref 11.5–15.5)
WBC: 3.7 10*3/uL — ABNORMAL LOW (ref 4.0–10.5)
nRBC: 0 % (ref 0.0–0.2)

## 2020-05-18 LAB — COMPREHENSIVE METABOLIC PANEL
ALT: 13 U/L (ref 0–44)
AST: 12 U/L — ABNORMAL LOW (ref 15–41)
Albumin: 3.8 g/dL (ref 3.5–5.0)
Alkaline Phosphatase: 50 U/L (ref 38–126)
Anion gap: 9 (ref 5–15)
BUN: 23 mg/dL (ref 8–23)
CO2: 27 mmol/L (ref 22–32)
Calcium: 9.8 mg/dL (ref 8.9–10.3)
Chloride: 101 mmol/L (ref 98–111)
Creatinine, Ser: 1.12 mg/dL — ABNORMAL HIGH (ref 0.44–1.00)
GFR, Estimated: 56 mL/min — ABNORMAL LOW (ref >60)
Glucose, Bld: 96 mg/dL (ref 70–99)
Potassium: 4 mmol/L (ref 3.5–5.1)
Sodium: 137 mmol/L (ref 135–145)
Total Bilirubin: 0.4 mg/dL (ref 0.3–1.2)
Total Protein: 6.9 g/dL (ref 6.5–8.1)

## 2020-05-18 LAB — MAGNESIUM: Magnesium: 1.9 mg/dL (ref 1.7–2.4)

## 2020-05-19 ENCOUNTER — Other Ambulatory Visit: Payer: Self-pay

## 2020-05-19 ENCOUNTER — Inpatient Hospital Stay (HOSPITAL_BASED_OUTPATIENT_CLINIC_OR_DEPARTMENT_OTHER): Payer: Managed Care, Other (non HMO) | Admitting: Hematology and Oncology

## 2020-05-19 ENCOUNTER — Ambulatory Visit
Admission: RE | Admit: 2020-05-19 | Discharge: 2020-05-19 | Disposition: A | Discharge status: Home / Self Care | Payer: Managed Care, Other (non HMO) | Source: Ambulatory Visit | Attending: Radiation Oncology | Admitting: Radiation Oncology

## 2020-05-19 DIAGNOSIS — Z5111 Encounter for antineoplastic chemotherapy: Secondary | ICD-10-CM | POA: Insufficient documentation

## 2020-05-19 DIAGNOSIS — C7951 Secondary malignant neoplasm of bone: Secondary | ICD-10-CM | POA: Insufficient documentation

## 2020-05-19 DIAGNOSIS — K5900 Constipation, unspecified: Secondary | ICD-10-CM | POA: Insufficient documentation

## 2020-05-19 DIAGNOSIS — Z7952 Long term (current) use of systemic steroids: Secondary | ICD-10-CM | POA: Insufficient documentation

## 2020-05-19 DIAGNOSIS — C530 Malignant neoplasm of endocervix: Secondary | ICD-10-CM | POA: Insufficient documentation

## 2020-05-19 DIAGNOSIS — Z79899 Other long term (current) drug therapy: Secondary | ICD-10-CM | POA: Insufficient documentation

## 2020-05-19 DIAGNOSIS — C539 Malignant neoplasm of cervix uteri, unspecified: Secondary | ICD-10-CM | POA: Diagnosis not present

## 2020-05-19 DIAGNOSIS — D61818 Other pancytopenia: Secondary | ICD-10-CM | POA: Insufficient documentation

## 2020-05-19 DIAGNOSIS — R110 Nausea: Secondary | ICD-10-CM | POA: Diagnosis not present

## 2020-05-19 DIAGNOSIS — N1330 Unspecified hydronephrosis: Secondary | ICD-10-CM

## 2020-05-20 ENCOUNTER — Other Ambulatory Visit: Payer: Self-pay

## 2020-05-20 ENCOUNTER — Ambulatory Visit
Admission: RE | Admit: 2020-05-20 | Discharge: 2020-05-20 | Disposition: A | Discharge status: Home / Self Care | Payer: Managed Care, Other (non HMO) | Source: Ambulatory Visit | Attending: Radiation Oncology | Admitting: Radiation Oncology

## 2020-05-20 ENCOUNTER — Encounter: Payer: Self-pay | Admitting: Hematology and Oncology

## 2020-05-20 DIAGNOSIS — R110 Nausea: Secondary | ICD-10-CM | POA: Insufficient documentation

## 2020-05-20 DIAGNOSIS — C539 Malignant neoplasm of cervix uteri, unspecified: Secondary | ICD-10-CM | POA: Diagnosis not present

## 2020-05-20 NOTE — Assessment & Plan Note (Signed)
She will continue scheduled antiemetics on day 3 after each cycle of treatment If her nausea persists, I might add dexamethasone for future treatment

## 2020-05-20 NOTE — Assessment & Plan Note (Signed)
She had recent flare of joint pain I recommend trial of acetaminophen with tramadol.

## 2020-05-20 NOTE — Assessment & Plan Note (Signed)
Overall, she tolerated treatment very well without major side effects except for very mild changes in bowel habits, some nausea and recent joint pain We will continue treatment as scheduled

## 2020-05-20 NOTE — Assessment & Plan Note (Signed)
Renal function has improved She will continue increase fluid hydration as tolerated

## 2020-05-20 NOTE — Progress Notes (Signed)
[place] OFFICE PROGRESS NOTE  Patient Care Team: Phyllis Minium, MD as PCP - General (Family Medicine) Phyllis Gell, MD as Consulting Physician (Obstetrics and Gynecology) Phyllis Mink Craige Cotta, RN as Oncology Nurse Navigator (Oncology)  ASSESSMENT & PLAN:  Cervical cancer, FIGO stage IVB ([name]) Overall, she tolerated treatment very well without major side effects except for very mild changes in bowel habits, some nausea and recent joint pain We will continue treatment as scheduled  Nausea without vomiting She will continue scheduled antiemetics on day 3 after each cycle of treatment If her nausea persists, I might add dexamethasone for future treatment  Hydronephrosis of left kidney Renal function has improved She will continue increase fluid hydration as tolerated  Metastasis to bone [facility]) She had recent flare of joint pain I recommend trial of acetaminophen with tramadol.   No orders of the defined types were placed in this encounter.   All questions were answered. The patient knows to call the clinic with any problems, questions or concerns. The total time spent in the appointment was 20 minutes encounter with patients including review of chart and various tests results, discussions about plan of care and coordination of care plan   Phyllis Lark, MD 05/20/2020 10:45 AM  INTERVAL HISTORY: Please see below for problem oriented charting. She returns for further follow-up She had more nausea this last cycle Denies significant changes in her bowel habits Her bone pain is stable No recent infection, fever or chills  SUMMARY OF ONCOLOGIC HISTORY: Oncology History  Cervical cancer, FIGO stage IVB ([name])  03/16/2020 Initial Diagnosis   The patient had history of new onset vaginal spotting on March 16, 2020.  She immediately called her OB/GYN's office and achieved an appointment with her OB/GYN's partner, Dr. Murrell Hebert, who saw and evaluated the patient on  March 19, 2020.  At that time a transvaginal ultrasound scan was performed which revealed a uterus measuring 5.3 x 8.4 x 4.6 cm with an ill-defined endometrium.  There is a complex cyst on the left ovary measuring 2.6 cm with peripheral blood flow noted.  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  Immunostains were positive for CEA and CDX2, negative for p16, ER, vimentin, and PAX8.  The differential diagnosis included primary gastric type endocervical adenocarcinoma (though this typically stains positive for PAX8) or a metastasis from pancreaticobiliary or upper GI primary.   03/29/2020 Pathology Results   1. Surgical [P], duodenum - PEPTIC DUODENITIS. - NO DYSPLASIA OR MALIGNANCY. 2. Surgical [P], gastric antrum and gastric body - REACTIVE GASTROPATHY. Phyllis Hebert IS NEGATIVE FOR HELICOBACTER PYLORI. - NO INTESTINAL METAPLASIA, DYSPLASIA, OR MALIGNANCY. 3. Surgical [P], gastric nodule - MILD REACTIVE GASTROPATHY. - NO INTESTINAL METAPLASIA, DYSPLASIA, OR MALIGNANCY. 4. Surgical [P], stomach, lesser curve ulcers - REACTIVE GASTROPATHY WITH EROSIONS. - NO INTESTINAL METAPLASIA, DYSPLASIA, OR MALIGNANCY. 5. Surgical [P], gastric polyps - HYPERPLASTIC POLYP. - NO INTESTINAL METAPLASIA, DYSPLASIA OR MALIGNANCY. 6. Surgical [P], colon, descending, polyp - HYPERPLASTIC POLYP. - NO DYSPLASIA OR MALIGNANCY.   04/06/2020 Procedure   EGD report  Normal esophagus. - A few gastric polyps. Suspected fundic gland polyps. - Non-bleeding gastric ulcers with pigmented material. Biopsied. - A single gastric polyp/nodule. Resected and retrieved. - Erythematous duodenopathy. Biopsied. - The examination was otherwise normal.   04/06/2020 Procedure   Colonoscopy report - Non-bleeding internal hemorrhoids. - One 4 mm polyp in the descending colon, removed with  a cold snare. Resected  and retrieved. - The examination was otherwise normal on direct and retroflexion views   04/19/2020 PET scan   1. Hypermetabolic uterine cervix mass compatible with known primary cervical malignancy, with hypermetabolism extending into the upper third of the vagina and extending throughout the uterine body. No frank extrauterine extension. 2. Mild to moderate left hydroureteronephrosis to the level of the left UVJ, concerning for tumor involvement of the left UVJ. 3. Hypermetabolic right common iliac nodal metastasis. 4. Hypermetabolic sclerotic right ischial bone metastasis. 5. Two indeterminate small scattered solid pulmonary nodules, below PET resolution, warranting attention on chest CT follow-up in 3 months. 6.  Aortic Atherosclerosis (ICD10-I70.0).   04/19/2020 Cancer Staging   Staging form: Cervix Uteri, AJCC Version 9 - Clinical stage from 04/19/2020: FIGO Stage IVB (cT3b, cN1a, cM1) - Signed by Phyllis Lark, MD on 04/19/2020   04/26/2020 Procedure   Successful placement of a right IJ approach Power Port with ultrasound and fluoroscopic guidance. The catheter is ready for use   04/30/2020 -  Chemotherapy   The patient had cisplatin for chemotherapy treatment.     Metastasis to bone ([place])  04/19/2020 Initial Diagnosis   Metastasis to bone ([facility])     REVIEW OF SYSTEMS:   Constitutional: Denies fevers, chills or abnormal weight loss Eyes: Denies blurriness of vision Ears, nose, mouth, throat, and face: Denies mucositis or sore throat Respiratory: Denies cough, dyspnea or wheezes Cardiovascular: Denies palpitation, chest discomfort or lower extremity swelling Skin: Denies abnormal skin rashes Lymphatics: Denies new lymphadenopathy or easy bruising Neurological:Denies numbness, tingling or new weaknesses Behavioral/Psych: Mood is stable, no new changes  All other systems were reviewed with the patient and are negative.  I have reviewed the past medical history, past surgical  history, social history and family history with the patient and they are unchanged from previous note.  ALLERGIES:  is allergic to penicillins.  MEDICATIONS:  Current Outpatient Medications  Medication Sig Dispense Refill  . Cholecalciferol (VITAMIN D3) 2000 units TABS Take 2 tablets by mouth daily.    . clobetasol cream (TEMOVATE) 0.05 % Apply topically. Left leg    . fenofibrate 160 MG tablet TAKE 1 TABLET BY MOUTH EVERY DAY 30 tablet 5  . hydroxychloroquine (PLAQUENIL) 200 MG tablet 2 TABLET WITH FOOD OR MILK ONCE A DAY ORALLY 30  2  . lidocaine-prilocaine (EMLA) cream Apply to affected area once 30 g 3  . LORazepam (ATIVAN) 0.5 MG tablet Take 1 tablet (0.5 mg total) by mouth every 8 (eight) hours as needed for anxiety. 30 tablet 0  . meloxicam (MOBIC) 15 MG tablet Take 15 mg by mouth daily.    . ondansetron (ZOFRAN) 8 MG tablet Take 1 tablet (8 mg total) by mouth every 8 (eight) hours as needed. Start on the third day after cisplatin chemotherapy. 30 tablet 1  . pantoprazole (PROTONIX) 40 MG tablet Take 1 tablet (40 mg total) by mouth 2 (two) times daily. 90 tablet 3  . prochlorperazine (COMPAZINE) 10 MG tablet Take 1 tablet (10 mg total) by mouth every 6 (six) hours as needed (Nausea or vomiting). 30 tablet 1  . traMADol (ULTRAM) 50 MG tablet Take 2 tablets (100 mg total) by mouth every 6 (six) hours as needed. 60 tablet 0   No current facility-administered medications for this visit.    PHYSICAL EXAMINATION: ECOG PERFORMANCE STATUS: 1 - Symptomatic but completely ambulatory  Vitals:   05/19/20 1240  BP: 138/72  Pulse: 85  Resp: 18  Temp: 98.5 F (36.9 C)  SpO2: 99%   Filed Weights   05/19/20 1240  Weight: 207 lb 9.6 oz (94.2 kg)    GENERAL:alert, no distress and comfortable SKIN: skin color, texture, turgor are normal, no rashes or significant lesions EYES: normal, Conjunctiva are pink and non-injected, sclera clear OROPHARYNX:no exudate, no erythema and lips, buccal  mucosa, and tongue normal  NECK: supple, thyroid normal size, non-tender, without nodularity LYMPH:  no palpable lymphadenopathy in the cervical, axillary or inguinal LUNGS: clear to auscultation and percussion with normal breathing effort HEART: regular rate & rhythm and no murmurs and no lower extremity edema ABDOMEN:abdomen soft, non-tender and normal bowel sounds Musculoskeletal:no cyanosis of digits and no clubbing  NEURO: alert & oriented x 3 with fluent speech, no focal motor/sensory deficits  LABORATORY DATA:  I have reviewed the data as listed    Component Value Date/Time   NA 137 05/18/2020 1150   NA 140 06/21/2016 1530   K 4.0 05/18/2020 1150   CL 101 05/18/2020 1150   CO2 27 05/18/2020 1150   GLUCOSE 96 05/18/2020 1150   BUN 23 05/18/2020 1150   BUN 16 06/21/2016 1530   CREATININE 1.12 (H) 05/18/2020 1150   CREATININE 0.87 07/02/2017 1549   CALCIUM 9.8 05/18/2020 1150   PROT 6.9 05/18/2020 1150   PROT 7.3 06/21/2016 1530   ALBUMIN 3.8 05/18/2020 1150   ALBUMIN 4.3 06/21/2016 1530   AST 12 (L) 05/18/2020 1150   ALT 13 05/18/2020 1150   ALKPHOS 50 05/18/2020 1150   BILITOT 0.4 05/18/2020 1150   BILITOT 0.3 06/21/2016 1530   GFRNONAA 56 (L) 05/18/2020 1150   GFRAA 111 06/21/2016 1530    No results found for: SPEP, UPEP  Lab Results  Component Value Date   WBC 3.7 (L) 05/18/2020   NEUTROABS 2.4 05/18/2020   HGB 10.5 (L) 05/18/2020   HCT 31.2 (L) 05/18/2020   MCV 84.3 05/18/2020   PLT 119 (L) 05/18/2020      Chemistry      Component Value Date/Time   NA 137 05/18/2020 1150   NA 140 06/21/2016 1530   K 4.0 05/18/2020 1150   CL 101 05/18/2020 1150   CO2 27 05/18/2020 1150   BUN 23 05/18/2020 1150   BUN 16 06/21/2016 1530   CREATININE 1.12 (H) 05/18/2020 1150   CREATININE 0.87 07/02/2017 1549      Component Value Date/Time   CALCIUM 9.8 05/18/2020 1150   ALKPHOS 50 05/18/2020 1150   AST 12 (L) 05/18/2020 1150   ALT 13 05/18/2020 1150   BILITOT  0.4 05/18/2020 1150   BILITOT 0.3 06/21/2016 1530

## 2020-05-21 ENCOUNTER — Other Ambulatory Visit: Payer: Managed Care, Other (non HMO)

## 2020-05-21 ENCOUNTER — Other Ambulatory Visit: Payer: Self-pay

## 2020-05-21 ENCOUNTER — Inpatient Hospital Stay: Payer: Managed Care, Other (non HMO)

## 2020-05-21 ENCOUNTER — Ambulatory Visit
Admission: RE | Admit: 2020-05-21 | Discharge: 2020-05-21 | Disposition: A | Discharge status: Home / Self Care | Payer: Managed Care, Other (non HMO) | Source: Ambulatory Visit | Attending: Radiation Oncology | Admitting: Radiation Oncology

## 2020-05-21 VITALS — BP 145/94 | Temp 98.7°F | Resp 18

## 2020-05-21 DIAGNOSIS — C539 Malignant neoplasm of cervix uteri, unspecified: Secondary | ICD-10-CM

## 2020-05-21 DIAGNOSIS — C7951 Secondary malignant neoplasm of bone: Secondary | ICD-10-CM

## 2020-05-21 MED ORDER — SODIUM CHLORIDE 0.9 % IV SOLN
150.0000 mg | Freq: Once | INTRAVENOUS | Status: AC
  Administered 2020-05-21: 150 mg via INTRAVENOUS
  Filled 2020-05-21: qty 150

## 2020-05-21 MED ORDER — PALONOSETRON HCL INJECTION 0.25 MG/5ML
INTRAVENOUS | Status: AC
  Filled 2020-05-21: qty 5

## 2020-05-21 MED ORDER — SODIUM CHLORIDE 0.9 % IV SOLN
35.0000 mg/m2 | Freq: Once | INTRAVENOUS | Status: AC
  Administered 2020-05-21: 73 mg via INTRAVENOUS
  Filled 2020-05-21: qty 73

## 2020-05-21 MED ORDER — PALONOSETRON HCL INJECTION 0.25 MG/5ML
0.2500 mg | Freq: Once | INTRAVENOUS | Status: AC
  Administered 2020-05-21: 0.25 mg via INTRAVENOUS

## 2020-05-21 MED ORDER — SODIUM CHLORIDE 0.9 % IV SOLN
10.0000 mg | Freq: Once | INTRAVENOUS | Status: AC
  Administered 2020-05-21: 10 mg via INTRAVENOUS
  Filled 2020-05-21: qty 10

## 2020-05-21 MED ORDER — SODIUM CHLORIDE 0.9 % IV SOLN
Freq: Once | INTRAVENOUS | Status: AC
  Filled 2020-05-21: qty 250

## 2020-05-21 MED ORDER — SODIUM CHLORIDE 0.9 % IV SOLN
Freq: Once | INTRAVENOUS | Status: AC
  Filled 2020-05-21: qty 10

## 2020-05-21 MED ORDER — SODIUM CHLORIDE 0.9% FLUSH
10.0000 mL | INTRAVENOUS | Status: DC | PRN
  Administered 2020-05-21: 10 mL
  Filled 2020-05-21: qty 10

## 2020-05-21 MED ORDER — HEPARIN SOD (PORK) LOCK FLUSH 100 UNIT/ML IV SOLN
500.0000 [IU] | Freq: Once | INTRAVENOUS | Status: AC | PRN
  Administered 2020-05-21: 500 [IU]
  Filled 2020-05-21: qty 5

## 2020-05-21 NOTE — Patient Instructions (Signed)
[place] Discharge Instructions for Patients Receiving Chemotherapy  Today you received the following chemotherapy agent: Cisplatin To help prevent nausea and vomiting after your treatment, we encourage you to take your nausea medication    If you develop nausea and vomiting that is not controlled by your nausea medication, call the clinic.   BELOW ARE SYMPTOMS THAT SHOULD BE REPORTED IMMEDIATELY:  *FEVER GREATER THAN 100.5 F  *CHILLS WITH OR WITHOUT FEVER  NAUSEA AND VOMITING THAT IS NOT CONTROLLED WITH YOUR NAUSEA MEDICATION  *UNUSUAL SHORTNESS OF BREATH  *UNUSUAL BRUISING OR BLEEDING  TENDERNESS IN MOUTH AND THROAT WITH OR WITHOUT PRESENCE OF ULCERS  *URINARY PROBLEMS  *BOWEL PROBLEMS  UNUSUAL RASH Items with * indicate a potential emergency and should be followed up as soon as possible.  Feel free to call the clinic should you have any questions or concerns. The clinic phone number is [phone].  Please show the [facility] at check-in to the Emergency Department and triage nurse.

## 2020-05-24 ENCOUNTER — Ambulatory Visit
Admission: RE | Admit: 2020-05-24 | Discharge: 2020-05-24 | Disposition: A | Discharge status: Home / Self Care | Payer: Managed Care, Other (non HMO) | Source: Ambulatory Visit | Attending: Radiation Oncology | Admitting: Radiation Oncology

## 2020-05-24 DIAGNOSIS — C539 Malignant neoplasm of cervix uteri, unspecified: Secondary | ICD-10-CM | POA: Diagnosis not present

## 2020-05-25 ENCOUNTER — Other Ambulatory Visit (HOSPITAL_COMMUNITY): Payer: Self-pay | Admitting: Radiation Oncology

## 2020-05-25 ENCOUNTER — Other Ambulatory Visit: Payer: Self-pay | Admitting: Radiation Oncology

## 2020-05-25 ENCOUNTER — Inpatient Hospital Stay: Payer: Managed Care, Other (non HMO)

## 2020-05-25 ENCOUNTER — Other Ambulatory Visit: Payer: Self-pay

## 2020-05-25 ENCOUNTER — Ambulatory Visit
Admission: RE | Admit: 2020-05-25 | Discharge: 2020-05-25 | Disposition: A | Discharge status: Home / Self Care | Payer: Managed Care, Other (non HMO) | Source: Ambulatory Visit | Attending: Radiation Oncology | Admitting: Radiation Oncology

## 2020-05-25 DIAGNOSIS — C539 Malignant neoplasm of cervix uteri, unspecified: Secondary | ICD-10-CM

## 2020-05-25 LAB — COMPREHENSIVE METABOLIC PANEL
ALT: 14 U/L (ref 0–44)
AST: 11 U/L — ABNORMAL LOW (ref 15–41)
Albumin: 3.8 g/dL (ref 3.5–5.0)
Alkaline Phosphatase: 59 U/L (ref 38–126)
Anion gap: 9 (ref 5–15)
BUN: 21 mg/dL (ref 8–23)
CO2: 26 mmol/L (ref 22–32)
Calcium: 9.7 mg/dL (ref 8.9–10.3)
Chloride: 103 mmol/L (ref 98–111)
Creatinine, Ser: 1.17 mg/dL — ABNORMAL HIGH (ref 0.44–1.00)
GFR, Estimated: 53 mL/min — ABNORMAL LOW (ref >60)
Glucose, Bld: 113 mg/dL — ABNORMAL HIGH (ref 70–99)
Potassium: 3.6 mmol/L (ref 3.5–5.1)
Sodium: 138 mmol/L (ref 135–145)
Total Bilirubin: 0.4 mg/dL (ref 0.3–1.2)
Total Protein: 7.3 g/dL (ref 6.5–8.1)

## 2020-05-25 LAB — CBC WITH DIFFERENTIAL/PLATELET
Abs Immature Granulocytes: 0.01 10*3/uL (ref 0.00–0.07)
Basophils Absolute: 0 10*3/uL (ref 0.0–0.1)
Basophils Relative: 0 %
Eosinophils Absolute: 0.2 10*3/uL (ref 0.0–0.5)
Eosinophils Relative: 6 %
HCT: 31.6 % — ABNORMAL LOW (ref 36.0–46.0)
Hemoglobin: 10.9 g/dL — ABNORMAL LOW (ref 12.0–15.0)
Immature Granulocytes: 0 %
Lymphocytes Relative: 17 %
Lymphs Abs: 0.7 10*3/uL (ref 0.7–4.0)
MCH: 28.7 pg (ref 26.0–34.0)
MCHC: 34.5 g/dL (ref 30.0–36.0)
MCV: 83.2 fL (ref 80.0–100.0)
Monocytes Absolute: 0.3 10*3/uL (ref 0.1–1.0)
Monocytes Relative: 9 %
Neutro Abs: 2.6 10*3/uL (ref 1.7–7.7)
Neutrophils Relative %: 68 %
Platelets: 97 10*3/uL — ABNORMAL LOW (ref 150–400)
RBC: 3.8 MIL/uL — ABNORMAL LOW (ref 3.87–5.11)
RDW: 14.7 % (ref 11.5–15.5)
WBC: 3.8 10*3/uL — ABNORMAL LOW (ref 4.0–10.5)
nRBC: 0 % (ref 0.0–0.2)

## 2020-05-25 LAB — MAGNESIUM: Magnesium: 1.7 mg/dL (ref 1.7–2.4)

## 2020-05-26 ENCOUNTER — Telehealth: Payer: Self-pay

## 2020-05-26 ENCOUNTER — Other Ambulatory Visit: Payer: Self-pay

## 2020-05-26 ENCOUNTER — Ambulatory Visit
Admission: RE | Admit: 2020-05-26 | Discharge: 2020-05-26 | Disposition: A | Discharge status: Home / Self Care | Payer: Managed Care, Other (non HMO) | Source: Ambulatory Visit | Attending: Radiation Oncology | Admitting: Radiation Oncology

## 2020-05-26 ENCOUNTER — Inpatient Hospital Stay (HOSPITAL_BASED_OUTPATIENT_CLINIC_OR_DEPARTMENT_OTHER): Payer: Managed Care, Other (non HMO) | Admitting: Hematology and Oncology

## 2020-05-26 ENCOUNTER — Encounter: Payer: Self-pay | Admitting: Hematology and Oncology

## 2020-05-26 DIAGNOSIS — C539 Malignant neoplasm of cervix uteri, unspecified: Secondary | ICD-10-CM | POA: Diagnosis not present

## 2020-05-26 DIAGNOSIS — R110 Nausea: Secondary | ICD-10-CM

## 2020-05-26 DIAGNOSIS — N1330 Unspecified hydronephrosis: Secondary | ICD-10-CM | POA: Diagnosis not present

## 2020-05-26 DIAGNOSIS — D61818 Other pancytopenia: Secondary | ICD-10-CM | POA: Diagnosis not present

## 2020-05-26 NOTE — Assessment & Plan Note (Signed)
Her nausea has resolved She will continue antiemetics as needed I plan to add dexamethasone in the future

## 2020-05-26 NOTE — Assessment & Plan Note (Signed)
She is not tolerating treatment She has severe nausea as well as worsening pancytopenia I recommend the patient to take a break I will see her again next week for further follow-up I will cancel her treatment this week She will continue radiation therapy as scheduled

## 2020-05-26 NOTE — Progress Notes (Signed)
[place] OFFICE PROGRESS NOTE  Patient Care Team: [name], MD as PCP - General (Family Medicine) [name], MD as Consulting Physician (Obstetrics and Gynecology) [name], RN as Oncology Nurse Navigator (Oncology)  ASSESSMENT & PLAN:  Cervical cancer, FIGO stage IVB [facility]) She is not tolerating treatment She has severe nausea as well as worsening pancytopenia I recommend the patient to take a break I will see her again next week for further follow-up I will cancel her treatment this week She will continue radiation therapy as scheduled  Hydronephrosis of left kidney Her renal function is stable She will continue hydration as tolerated  Pancytopenia, acquired ([place]) She has worsening pancytopenia due to treatment She has excessive fatigue She does not need transfusion support but I recommend we hold her treatment this week and she is in agreement  Nausea without vomiting Her nausea has resolved She will continue antiemetics as needed I plan to add dexamethasone in the future   No orders of the defined types were placed in this encounter.   All questions were answered. The patient knows to call the clinic with any problems, questions or concerns. The total time spent in the appointment was 25 minutes encounter with patients including review of chart and various tests results, discussions about plan of care and coordination of care plan   [name], MD 05/26/2020 1:33 PM  INTERVAL HISTORY: Please see below for problem oriented charting. She is seen as part of her weekly visit before chemotherapy Since her last time I saw her, she have lost some weight She has been complaining of profound nausea for 3 days after chemo, resolved with antiemetics No peripheral neuropathy Denies constipation  SUMMARY OF ONCOLOGIC HISTORY: Oncology History  Cervical cancer, FIGO stage IVB ([place])  03/16/2020 Initial Diagnosis   The patient had history  of new onset vaginal spotting on March 16, 2020.  She immediately called her OB/GYN's office and achieved an appointment with her OB/GYN's partner, Dr. Murrell Redden, who saw and evaluated the patient on March 19, 2020.  At that time a transvaginal ultrasound scan was performed which revealed a uterus measuring 5.3 x 8.4 x 4.6 cm with an ill-defined endometrium.  There is a complex cyst on the left ovary measuring 2.6 cm with peripheral blood flow noted.  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  Immunostains were positive for CEA and CDX2, negative for p16, ER, vimentin, and PAX8.  The differential diagnosis included primary gastric type endocervical adenocarcinoma (though this typically stains positive for PAX8) or a metastasis from pancreaticobiliary or upper GI primary.   03/29/2020 Pathology Results   1. Surgical [P], duodenum - PEPTIC DUODENITIS. - NO DYSPLASIA OR MALIGNANCY. 2. Surgical [P], gastric antrum and gastric body - REACTIVE GASTROPATHY. [place] IS NEGATIVE FOR HELICOBACTER PYLORI. - NO INTESTINAL METAPLASIA, DYSPLASIA, OR MALIGNANCY. 3. Surgical [P], gastric nodule - MILD REACTIVE GASTROPATHY. - NO INTESTINAL METAPLASIA, DYSPLASIA, OR MALIGNANCY. 4. Surgical [P], stomach, lesser curve ulcers - REACTIVE GASTROPATHY WITH EROSIONS. - NO INTESTINAL METAPLASIA, DYSPLASIA, OR MALIGNANCY. 5. Surgical [P], gastric polyps - HYPERPLASTIC POLYP. - NO INTESTINAL METAPLASIA, DYSPLASIA OR MALIGNANCY. 6. Surgical [P], colon, descending, polyp - HYPERPLASTIC POLYP. - NO DYSPLASIA OR MALIGNANCY.   04/06/2020 Procedure   EGD report  Normal esophagus. - A few gastric polyps. Suspected fundic gland polyps. - Non-bleeding gastric ulcers with pigmented material. Biopsied. - A single  gastric polyp/nodule. Resected and retrieved. - Erythematous duodenopathy.  Biopsied. - The examination was otherwise normal.   04/06/2020 Procedure   Colonoscopy report - Non-bleeding internal hemorrhoids. - One 4 mm polyp in the descending colon, removed with a cold snare. Resected and retrieved. - The examination was otherwise normal on direct and retroflexion views   04/19/2020 PET scan   1. Hypermetabolic uterine cervix mass compatible with known primary cervical malignancy, with hypermetabolism extending into the upper third of the vagina and extending throughout the uterine body. No frank extrauterine extension. 2. Mild to moderate left hydroureteronephrosis to the level of the left UVJ, concerning for tumor involvement of the left UVJ. 3. Hypermetabolic right common iliac nodal metastasis. 4. Hypermetabolic sclerotic right ischial bone metastasis. 5. Two indeterminate small scattered solid pulmonary nodules, below PET resolution, warranting attention on chest CT follow-up in 3 months. 6.  Aortic Atherosclerosis (ICD10-I70.0).   04/19/2020 Cancer Staging   Staging form: Cervix Uteri, AJCC Version 9 - Clinical stage from 04/19/2020: FIGO Stage IVB (cT3b, cN1a, cM1) - Signed by [name], MD on 04/19/2020   04/26/2020 Procedure   Successful placement of a right IJ approach Power Port with ultrasound and fluoroscopic guidance. The catheter is ready for use   04/30/2020 -  Chemotherapy   The patient had cisplatin for chemotherapy treatment.     Metastasis to bone ([place])  04/19/2020 Initial Diagnosis   Metastasis to bone ([facility])     REVIEW OF SYSTEMS:   Constitutional: Denies fevers, chills  Eyes: Denies blurriness of vision Ears, nose, mouth, throat, and face: Denies mucositis or sore throat Respiratory: Denies cough, dyspnea or wheezes Cardiovascular: Denies palpitation, chest discomfort or lower extremity swelling Skin: Denies abnormal skin rashes Lymphatics: Denies new lymphadenopathy or easy bruising Neurological:Denies numbness, tingling or new  weaknesses Behavioral/Psych: Mood is stable, no new changes  All other systems were reviewed with the patient and are negative.  I have reviewed the past medical history, past surgical history, social history and family history with the patient and they are unchanged from previous note.  ALLERGIES:  is allergic to penicillins.  MEDICATIONS:  Current Outpatient Medications  Medication Sig Dispense Refill  . Cholecalciferol (VITAMIN D3) 2000 units TABS Take 2 tablets by mouth daily.    . clobetasol cream (TEMOVATE) 0.05 % Apply topically. Left leg    . fenofibrate 160 MG tablet TAKE 1 TABLET BY MOUTH EVERY DAY 30 tablet 5  . hydroxychloroquine (PLAQUENIL) 200 MG tablet 2 TABLET WITH FOOD OR MILK ONCE A DAY ORALLY 30  2  . lidocaine-prilocaine (EMLA) cream Apply to affected area once 30 g 3  . LORazepam (ATIVAN) 0.5 MG tablet Take 1 tablet (0.5 mg total) by mouth every 8 (eight) hours as needed for anxiety. 30 tablet 0  . meloxicam (MOBIC) 15 MG tablet Take 15 mg by mouth daily.    . ondansetron (ZOFRAN) 8 MG tablet Take 1 tablet (8 mg total) by mouth every 8 (eight) hours as needed. Start on the third day after cisplatin chemotherapy. 30 tablet 1  . pantoprazole (PROTONIX) 40 MG tablet Take 1 tablet (40 mg total) by mouth 2 (two) times daily. 90 tablet 3  . prochlorperazine (COMPAZINE) 10 MG tablet Take 1 tablet (10 mg total) by mouth every 6 (six) hours as needed (Nausea or vomiting). 30 tablet 1  . traMADol (ULTRAM) 50 MG tablet Take 2 tablets (100 mg total) by mouth every 6 (six) hours as needed. 60 tablet 0   No  current facility-administered medications for this visit.    PHYSICAL EXAMINATION: ECOG PERFORMANCE STATUS: 2 - Symptomatic, <50% confined to bed  Vitals:   05/26/20 1215  BP: (!) 153/79  Pulse: 84  Resp: 18  Temp: 98.9 F (37.2 C)  SpO2: 99%   Filed Weights   05/26/20 1215  Weight: 204 lb 6.4 oz (92.7 kg)    GENERAL:alert, no distress and comfortable NEURO:  alert & oriented x 3 with fluent speech, no focal motor/sensory deficits  LABORATORY DATA:  I have reviewed the data as listed    Component Value Date/Time   NA 138 05/25/2020 1136   NA 140 06/21/2016 1530   K 3.6 05/25/2020 1136   CL 103 05/25/2020 1136   CO2 26 05/25/2020 1136   GLUCOSE 113 (H) 05/25/2020 1136   BUN 21 05/25/2020 1136   BUN 16 06/21/2016 1530   CREATININE 1.17 (H) 05/25/2020 1136   CREATININE 0.87 07/02/2017 1549   CALCIUM 9.7 05/25/2020 1136   PROT 7.3 05/25/2020 1136   PROT 7.3 06/21/2016 1530   ALBUMIN 3.8 05/25/2020 1136   ALBUMIN 4.3 06/21/2016 1530   AST 11 (L) 05/25/2020 1136   ALT 14 05/25/2020 1136   ALKPHOS 59 05/25/2020 1136   BILITOT 0.4 05/25/2020 1136   BILITOT 0.3 06/21/2016 1530   GFRNONAA 53 (L) 05/25/2020 1136   GFRAA 111 06/21/2016 1530    No results found for: SPEP, UPEP  Lab Results  Component Value Date   WBC 3.8 (L) 05/25/2020   NEUTROABS 2.6 05/25/2020   HGB 10.9 (L) 05/25/2020   HCT 31.6 (L) 05/25/2020   MCV 83.2 05/25/2020   PLT 97 (L) 05/25/2020      Chemistry      Component Value Date/Time   NA 138 05/25/2020 1136   NA 140 06/21/2016 1530   K 3.6 05/25/2020 1136   CL 103 05/25/2020 1136   CO2 26 05/25/2020 1136   BUN 21 05/25/2020 1136   BUN 16 06/21/2016 1530   CREATININE 1.17 (H) 05/25/2020 1136   CREATININE 0.87 07/02/2017 1549      Component Value Date/Time   CALCIUM 9.7 05/25/2020 1136   ALKPHOS 59 05/25/2020 1136   AST 11 (L) 05/25/2020 1136   ALT 14 05/25/2020 1136   BILITOT 0.4 05/25/2020 1136   BILITOT 0.3 06/21/2016 1530

## 2020-05-26 NOTE — Assessment & Plan Note (Signed)
She has worsening pancytopenia due to treatment She has excessive fatigue She does not need transfusion support but I recommend we hold her treatment this week and she is in agreement

## 2020-05-26 NOTE — Assessment & Plan Note (Signed)
Her renal function is stable She will continue hydration as tolerated

## 2020-05-26 NOTE — Telephone Encounter (Signed)
Faxed dental recommendation letter to Dr. Romie Minus from Dr. Alvy Bimler to [phone]. Received confirmation.

## 2020-05-27 ENCOUNTER — Ambulatory Visit
Admission: RE | Admit: 2020-05-27 | Discharge: 2020-05-27 | Disposition: A | Discharge status: Home / Self Care | Payer: Managed Care, Other (non HMO) | Source: Ambulatory Visit | Attending: Radiation Oncology | Admitting: Radiation Oncology

## 2020-05-27 DIAGNOSIS — C539 Malignant neoplasm of cervix uteri, unspecified: Secondary | ICD-10-CM | POA: Diagnosis not present

## 2020-05-28 ENCOUNTER — Ambulatory Visit
Admission: RE | Admit: 2020-05-28 | Discharge: 2020-05-28 | Disposition: A | Discharge status: Home / Self Care | Payer: Managed Care, Other (non HMO) | Source: Ambulatory Visit | Attending: Radiation Oncology | Admitting: Radiation Oncology

## 2020-05-28 ENCOUNTER — Other Ambulatory Visit: Payer: Managed Care, Other (non HMO)

## 2020-05-28 ENCOUNTER — Inpatient Hospital Stay: Payer: Managed Care, Other (non HMO)

## 2020-05-28 DIAGNOSIS — C539 Malignant neoplasm of cervix uteri, unspecified: Secondary | ICD-10-CM | POA: Diagnosis not present

## 2020-05-31 ENCOUNTER — Ambulatory Visit
Admission: RE | Admit: 2020-05-31 | Discharge: 2020-05-31 | Disposition: A | Discharge status: Home / Self Care | Payer: Managed Care, Other (non HMO) | Source: Ambulatory Visit | Attending: Radiation Oncology | Admitting: Radiation Oncology

## 2020-05-31 DIAGNOSIS — C539 Malignant neoplasm of cervix uteri, unspecified: Secondary | ICD-10-CM | POA: Diagnosis not present

## 2020-06-01 ENCOUNTER — Other Ambulatory Visit: Payer: Self-pay

## 2020-06-01 ENCOUNTER — Ambulatory Visit
Admission: RE | Admit: 2020-06-01 | Discharge: 2020-06-01 | Disposition: A | Discharge status: Home / Self Care | Payer: Managed Care, Other (non HMO) | Source: Ambulatory Visit | Attending: Radiation Oncology | Admitting: Radiation Oncology

## 2020-06-01 ENCOUNTER — Inpatient Hospital Stay: Payer: Managed Care, Other (non HMO)

## 2020-06-01 DIAGNOSIS — C539 Malignant neoplasm of cervix uteri, unspecified: Secondary | ICD-10-CM

## 2020-06-01 LAB — CBC WITH DIFFERENTIAL/PLATELET
Abs Immature Granulocytes: 0.02 10*3/uL (ref 0.00–0.07)
Basophils Absolute: 0 10*3/uL (ref 0.0–0.1)
Basophils Relative: 0 %
Eosinophils Absolute: 0.1 10*3/uL (ref 0.0–0.5)
Eosinophils Relative: 5 %
HCT: 29.6 % — ABNORMAL LOW (ref 36.0–46.0)
Hemoglobin: 9.9 g/dL — ABNORMAL LOW (ref 12.0–15.0)
Immature Granulocytes: 1 %
Lymphocytes Relative: 18 %
Lymphs Abs: 0.5 10*3/uL — ABNORMAL LOW (ref 0.7–4.0)
MCH: 28.7 pg (ref 26.0–34.0)
MCHC: 33.4 g/dL (ref 30.0–36.0)
MCV: 85.8 fL (ref 80.0–100.0)
Monocytes Absolute: 0.3 10*3/uL (ref 0.1–1.0)
Monocytes Relative: 11 %
Neutro Abs: 1.8 10*3/uL (ref 1.7–7.7)
Neutrophils Relative %: 65 %
Platelets: 86 10*3/uL — ABNORMAL LOW (ref 150–400)
RBC: 3.45 MIL/uL — ABNORMAL LOW (ref 3.87–5.11)
RDW: 15.9 % — ABNORMAL HIGH (ref 11.5–15.5)
WBC: 2.8 10*3/uL — ABNORMAL LOW (ref 4.0–10.5)
nRBC: 0 % (ref 0.0–0.2)

## 2020-06-01 LAB — MAGNESIUM: Magnesium: 1.6 mg/dL — ABNORMAL LOW (ref 1.7–2.4)

## 2020-06-01 LAB — COMPREHENSIVE METABOLIC PANEL
ALT: 16 U/L (ref 0–44)
AST: 11 U/L — ABNORMAL LOW (ref 15–41)
Albumin: 3.6 g/dL (ref 3.5–5.0)
Alkaline Phosphatase: 55 U/L (ref 38–126)
Anion gap: 9 (ref 5–15)
BUN: 18 mg/dL (ref 8–23)
CO2: 26 mmol/L (ref 22–32)
Calcium: 10 mg/dL (ref 8.9–10.3)
Chloride: 104 mmol/L (ref 98–111)
Creatinine, Ser: 1.18 mg/dL — ABNORMAL HIGH (ref 0.44–1.00)
GFR, Estimated: 52 mL/min — ABNORMAL LOW (ref >60)
Glucose, Bld: 106 mg/dL — ABNORMAL HIGH (ref 70–99)
Potassium: 3.7 mmol/L (ref 3.5–5.1)
Sodium: 139 mmol/L (ref 135–145)
Total Bilirubin: 0.4 mg/dL (ref 0.3–1.2)
Total Protein: 6.8 g/dL (ref 6.5–8.1)

## 2020-06-02 ENCOUNTER — Other Ambulatory Visit: Payer: Self-pay

## 2020-06-02 ENCOUNTER — Inpatient Hospital Stay (HOSPITAL_BASED_OUTPATIENT_CLINIC_OR_DEPARTMENT_OTHER): Payer: Managed Care, Other (non HMO) | Admitting: Hematology and Oncology

## 2020-06-02 ENCOUNTER — Ambulatory Visit
Admission: RE | Admit: 2020-06-02 | Discharge: 2020-06-02 | Disposition: A | Discharge status: Home / Self Care | Payer: Managed Care, Other (non HMO) | Source: Ambulatory Visit | Attending: Radiation Oncology | Admitting: Radiation Oncology

## 2020-06-02 DIAGNOSIS — D61818 Other pancytopenia: Secondary | ICD-10-CM | POA: Diagnosis not present

## 2020-06-02 DIAGNOSIS — N1330 Unspecified hydronephrosis: Secondary | ICD-10-CM | POA: Diagnosis not present

## 2020-06-02 DIAGNOSIS — C539 Malignant neoplasm of cervix uteri, unspecified: Secondary | ICD-10-CM | POA: Diagnosis not present

## 2020-06-03 ENCOUNTER — Ambulatory Visit
Admission: RE | Admit: 2020-06-03 | Discharge: 2020-06-03 | Disposition: A | Discharge status: Home / Self Care | Payer: Managed Care, Other (non HMO) | Source: Ambulatory Visit | Attending: Radiation Oncology | Admitting: Radiation Oncology

## 2020-06-03 ENCOUNTER — Encounter: Payer: Self-pay | Admitting: Hematology and Oncology

## 2020-06-03 DIAGNOSIS — C539 Malignant neoplasm of cervix uteri, unspecified: Secondary | ICD-10-CM | POA: Diagnosis not present

## 2020-06-03 NOTE — Assessment & Plan Note (Signed)
She has progressive pancytopenia despite withholding chemotherapy recently which is not unexpected For now, I do not plan to resume chemotherapy until recovery of her pancytopenia We will continue to monitor her blood counts carefully and continue on aggressive supportive care

## 2020-06-03 NOTE — Progress Notes (Signed)
[place] OFFICE PROGRESS NOTE  Patient Care Team: [name], MD as PCP - General (Family Medicine) [name], MD as Consulting Physician (Obstetrics and Gynecology) [name], RN as Oncology Nurse Navigator (Oncology)  ASSESSMENT & PLAN:  Cervical cancer, FIGO stage IVB [facility]) She has progressive pancytopenia despite withholding chemotherapy recently which is not unexpected For now, I do not plan to resume chemotherapy until recovery of her pancytopenia We will continue to monitor her blood counts carefully and continue on aggressive supportive care  Pancytopenia, acquired [facility]) She has worsening pancytopenia due to treatment She has excessive fatigue She does not need transfusion support but I recommend we hold her treatment this week and she is in agreement  Hydronephrosis of left kidney Her renal function is stable She will continue hydration as tolerated   No orders of the defined types were placed in this encounter.   All questions were answered. The patient knows to call the clinic with any problems, questions or concerns. The total time spent in the appointment was 20 minutes encounter with patients including review of chart and various tests results, discussions about plan of care and coordination of care plan   [name], MD 06/03/2020 8:29 AM  INTERVAL HISTORY: Please see below for problem oriented charting. She returns to be seen as part of her weekly follow-up and chemotherapy Since last time I saw her, she has progressive excessive fatigue Her appetite is fair Nausea has improved The patient denies any recent signs or symptoms of bleeding such as spontaneous epistaxis, hematuria or hematochezia.   SUMMARY OF ONCOLOGIC HISTORY: Oncology History  Cervical cancer, FIGO stage IVB ([place])  03/16/2020 Initial Diagnosis   The patient had history of new onset vaginal spotting on March 16, 2020.  She immediately called her  OB/GYN's office and achieved an appointment with her OB/GYN's partner, Dr. Murrell Redden, who saw and evaluated the patient on March 19, 2020.  At that time a transvaginal ultrasound scan was performed which revealed a uterus measuring 5.3 x 8.4 x 4.6 cm with an ill-defined endometrium.  There is a complex cyst on the left ovary measuring 2.6 cm with peripheral blood flow noted.  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  Immunostains were positive for CEA and CDX2, negative for p16, ER, vimentin, and PAX8.  The differential diagnosis included primary gastric type endocervical adenocarcinoma (though this typically stains positive for PAX8) or a metastasis from pancreaticobiliary or upper GI primary.   03/29/2020 Pathology Results   1. Surgical [P], duodenum - PEPTIC DUODENITIS. - NO DYSPLASIA OR MALIGNANCY. 2. Surgical [P], gastric antrum and gastric body - REACTIVE GASTROPATHY. Hinton Dyer IS NEGATIVE FOR HELICOBACTER PYLORI. - NO INTESTINAL METAPLASIA, DYSPLASIA, OR MALIGNANCY. 3. Surgical [P], gastric nodule - MILD REACTIVE GASTROPATHY. - NO INTESTINAL METAPLASIA, DYSPLASIA, OR MALIGNANCY. 4. Surgical [P], stomach, lesser curve ulcers - REACTIVE GASTROPATHY WITH EROSIONS. - NO INTESTINAL METAPLASIA, DYSPLASIA, OR MALIGNANCY. 5. Surgical [P], gastric polyps - HYPERPLASTIC POLYP. - NO INTESTINAL METAPLASIA, DYSPLASIA OR MALIGNANCY. 6. Surgical [P], colon, descending, polyp - HYPERPLASTIC POLYP. - NO DYSPLASIA OR MALIGNANCY.   04/06/2020 Procedure   EGD report  Normal esophagus. - A few gastric polyps. Suspected fundic gland polyps. - Non-bleeding gastric ulcers with pigmented material. Biopsied. - A single gastric polyp/nodule. Resected and retrieved. - Erythematous duodenopathy. Biopsied. - The examination was otherwise normal.   04/06/2020 Procedure  Colonoscopy  report - Non-bleeding internal hemorrhoids. - One 4 mm polyp in the descending colon, removed with a cold snare. Resected and retrieved. - The examination was otherwise normal on direct and retroflexion views   04/19/2020 PET scan   1. Hypermetabolic uterine cervix mass compatible with known primary cervical malignancy, with hypermetabolism extending into the upper third of the vagina and extending throughout the uterine body. No frank extrauterine extension. 2. Mild to moderate left hydroureteronephrosis to the level of the left UVJ, concerning for tumor involvement of the left UVJ. 3. Hypermetabolic right common iliac nodal metastasis. 4. Hypermetabolic sclerotic right ischial bone metastasis. 5. Two indeterminate small scattered solid pulmonary nodules, below PET resolution, warranting attention on chest CT follow-up in 3 months. 6.  Aortic Atherosclerosis (ICD10-I70.0).   04/19/2020 Cancer Staging   Staging form: Cervix Uteri, AJCC Version 9 - Clinical stage from 04/19/2020: FIGO Stage IVB (cT3b, cN1a, cM1) - Signed by [name], MD on 04/19/2020   04/26/2020 Procedure   Successful placement of a right IJ approach Power Port with ultrasound and fluoroscopic guidance. The catheter is ready for use   04/30/2020 -  Chemotherapy   The patient had cisplatin for chemotherapy treatment.     Metastasis to bone ([place])  04/19/2020 Initial Diagnosis   Metastasis to bone ([facility])     REVIEW OF SYSTEMS:   Constitutional: Denies fevers, chills or abnormal weight loss Eyes: Denies blurriness of vision Ears, nose, mouth, throat, and face: Denies mucositis or sore throat Respiratory: Denies cough, dyspnea or wheezes Cardiovascular: Denies palpitation, chest discomfort or lower extremity swelling Gastrointestinal:  Denies nausea, heartburn or change in bowel habits Skin: Denies abnormal skin rashes Lymphatics: Denies new lymphadenopathy or easy bruising Neurological:Denies numbness, tingling or new  weaknesses Behavioral/Psych: Mood is stable, no new changes  All other systems were reviewed with the patient and are negative.  I have reviewed the past medical history, past surgical history, social history and family history with the patient and they are unchanged from previous note.  ALLERGIES:  is allergic to penicillins.  MEDICATIONS:  Current Outpatient Medications  Medication Sig Dispense Refill  . Cholecalciferol (VITAMIN D3) 2000 units TABS Take 2 tablets by mouth daily.    . clobetasol cream (TEMOVATE) 0.05 % Apply topically. Left leg    . fenofibrate 160 MG tablet TAKE 1 TABLET BY MOUTH EVERY DAY 30 tablet 5  . hydroxychloroquine (PLAQUENIL) 200 MG tablet 2 TABLET WITH FOOD OR MILK ONCE A DAY ORALLY 30  2  . lidocaine-prilocaine (EMLA) cream Apply to affected area once 30 g 3  . LORazepam (ATIVAN) 0.5 MG tablet Take 1 tablet (0.5 mg total) by mouth every 8 (eight) hours as needed for anxiety. 30 tablet 0  . meloxicam (MOBIC) 15 MG tablet Take 15 mg by mouth daily.    . ondansetron (ZOFRAN) 8 MG tablet Take 1 tablet (8 mg total) by mouth every 8 (eight) hours as needed. Start on the third day after cisplatin chemotherapy. 30 tablet 1  . pantoprazole (PROTONIX) 40 MG tablet Take 1 tablet (40 mg total) by mouth 2 (two) times daily. 90 tablet 3  . prochlorperazine (COMPAZINE) 10 MG tablet Take 1 tablet (10 mg total) by mouth every 6 (six) hours as needed (Nausea or vomiting). 30 tablet 1  . traMADol (ULTRAM) 50 MG tablet Take 2 tablets (100 mg total) by mouth every 6 (six) hours as needed. 60 tablet 0   No current facility-administered medications for this visit.  PHYSICAL EXAMINATION: ECOG PERFORMANCE STATUS: 1 - Symptomatic but completely ambulatory  Vitals:   06/02/20 1210  BP: (!) 148/78  Pulse: 79  Resp: 18  Temp: 98.4 F (36.9 C)  SpO2: 100%   Filed Weights   06/02/20 1210  Weight: 204 lb 3.2 oz (92.6 kg)    GENERAL:alert, no distress and comfortable NEURO:  alert & oriented x 3 with fluent speech, no focal motor/sensory deficits  LABORATORY DATA:  I have reviewed the data as listed    Component Value Date/Time   NA 139 06/01/2020 1138   NA 140 06/21/2016 1530   K 3.7 06/01/2020 1138   CL 104 06/01/2020 1138   CO2 26 06/01/2020 1138   GLUCOSE 106 (H) 06/01/2020 1138   BUN 18 06/01/2020 1138   BUN 16 06/21/2016 1530   CREATININE 1.18 (H) 06/01/2020 1138   CREATININE 0.87 07/02/2017 1549   CALCIUM 10.0 06/01/2020 1138   PROT 6.8 06/01/2020 1138   PROT 7.3 06/21/2016 1530   ALBUMIN 3.6 06/01/2020 1138   ALBUMIN 4.3 06/21/2016 1530   AST 11 (L) 06/01/2020 1138   ALT 16 06/01/2020 1138   ALKPHOS 55 06/01/2020 1138   BILITOT 0.4 06/01/2020 1138   BILITOT 0.3 06/21/2016 1530   GFRNONAA 52 (L) 06/01/2020 1138   GFRAA 111 06/21/2016 1530    No results found for: SPEP, UPEP  Lab Results  Component Value Date   WBC 2.8 (L) 06/01/2020   NEUTROABS 1.8 06/01/2020   HGB 9.9 (L) 06/01/2020   HCT 29.6 (L) 06/01/2020   MCV 85.8 06/01/2020   PLT 86 (L) 06/01/2020      Chemistry      Component Value Date/Time   NA 139 06/01/2020 1138   NA 140 06/21/2016 1530   K 3.7 06/01/2020 1138   CL 104 06/01/2020 1138   CO2 26 06/01/2020 1138   BUN 18 06/01/2020 1138   BUN 16 06/21/2016 1530   CREATININE 1.18 (H) 06/01/2020 1138   CREATININE 0.87 07/02/2017 1549      Component Value Date/Time   CALCIUM 10.0 06/01/2020 1138   ALKPHOS 55 06/01/2020 1138   AST 11 (L) 06/01/2020 1138   ALT 16 06/01/2020 1138   BILITOT 0.4 06/01/2020 1138   BILITOT 0.3 06/21/2016 1530

## 2020-06-03 NOTE — Assessment & Plan Note (Signed)
Her renal function is stable She will continue hydration as tolerated

## 2020-06-03 NOTE — Assessment & Plan Note (Signed)
She has worsening pancytopenia due to treatment She has excessive fatigue She does not need transfusion support but I recommend we hold her treatment this week and she is in agreement

## 2020-06-04 ENCOUNTER — Ambulatory Visit
Admission: RE | Admit: 2020-06-04 | Discharge: 2020-06-04 | Disposition: A | Discharge status: Home / Self Care | Payer: Managed Care, Other (non HMO) | Source: Ambulatory Visit | Attending: Radiation Oncology | Admitting: Radiation Oncology

## 2020-06-04 ENCOUNTER — Inpatient Hospital Stay: Payer: Managed Care, Other (non HMO)

## 2020-06-04 ENCOUNTER — Other Ambulatory Visit: Payer: Managed Care, Other (non HMO)

## 2020-06-04 DIAGNOSIS — C539 Malignant neoplasm of cervix uteri, unspecified: Secondary | ICD-10-CM | POA: Diagnosis not present

## 2020-06-07 ENCOUNTER — Ambulatory Visit
Admission: RE | Admit: 2020-06-07 | Discharge: 2020-06-07 | Disposition: A | Discharge status: Home / Self Care | Payer: Managed Care, Other (non HMO) | Source: Ambulatory Visit | Attending: Radiation Oncology | Admitting: Radiation Oncology

## 2020-06-07 ENCOUNTER — Inpatient Hospital Stay (HOSPITAL_BASED_OUTPATIENT_CLINIC_OR_DEPARTMENT_OTHER): Payer: Managed Care, Other (non HMO) | Admitting: Genetic Counselor

## 2020-06-07 ENCOUNTER — Inpatient Hospital Stay: Payer: Managed Care, Other (non HMO)

## 2020-06-07 ENCOUNTER — Encounter: Payer: Self-pay | Admitting: Genetic Counselor

## 2020-06-07 ENCOUNTER — Other Ambulatory Visit: Payer: Self-pay

## 2020-06-07 DIAGNOSIS — C539 Malignant neoplasm of cervix uteri, unspecified: Secondary | ICD-10-CM

## 2020-06-07 DIAGNOSIS — Z8049 Family history of malignant neoplasm of other genital organs: Secondary | ICD-10-CM

## 2020-06-07 DIAGNOSIS — Z803 Family history of malignant neoplasm of breast: Secondary | ICD-10-CM | POA: Diagnosis not present

## 2020-06-07 DIAGNOSIS — Z806 Family history of leukemia: Secondary | ICD-10-CM

## 2020-06-07 LAB — COMPREHENSIVE METABOLIC PANEL
ALT: 13 U/L (ref 0–44)
AST: 11 U/L — ABNORMAL LOW (ref 15–41)
Albumin: 3.7 g/dL (ref 3.5–5.0)
Alkaline Phosphatase: 60 U/L (ref 38–126)
Anion gap: 9 (ref 5–15)
BUN: 17 mg/dL (ref 8–23)
CO2: 26 mmol/L (ref 22–32)
Calcium: 9.4 mg/dL (ref 8.9–10.3)
Chloride: 105 mmol/L (ref 98–111)
Creatinine, Ser: 0.98 mg/dL (ref 0.44–1.00)
GFR, Estimated: >60 mL/min (ref >60)
Glucose, Bld: 122 mg/dL — ABNORMAL HIGH (ref 70–99)
Potassium: 4.1 mmol/L (ref 3.5–5.1)
Sodium: 140 mmol/L (ref 135–145)
Total Bilirubin: 0.3 mg/dL (ref 0.3–1.2)
Total Protein: 6.9 g/dL (ref 6.5–8.1)

## 2020-06-07 LAB — CBC WITH DIFFERENTIAL/PLATELET
Abs Immature Granulocytes: 0.01 10*3/uL (ref 0.00–0.07)
Basophils Absolute: 0 10*3/uL (ref 0.0–0.1)
Basophils Relative: 0 %
Eosinophils Absolute: 0.2 10*3/uL (ref 0.0–0.5)
Eosinophils Relative: 9 %
HCT: 28.7 % — ABNORMAL LOW (ref 36.0–46.0)
Hemoglobin: 9.7 g/dL — ABNORMAL LOW (ref 12.0–15.0)
Immature Granulocytes: 1 %
Lymphocytes Relative: 21 %
Lymphs Abs: 0.4 10*3/uL — ABNORMAL LOW (ref 0.7–4.0)
MCH: 29.4 pg (ref 26.0–34.0)
MCHC: 33.8 g/dL (ref 30.0–36.0)
MCV: 87 fL (ref 80.0–100.0)
Monocytes Absolute: 0.2 10*3/uL (ref 0.1–1.0)
Monocytes Relative: 10 %
Neutro Abs: 1.2 10*3/uL — ABNORMAL LOW (ref 1.7–7.7)
Neutrophils Relative %: 59 %
Platelets: 114 10*3/uL — ABNORMAL LOW (ref 150–400)
RBC: 3.3 MIL/uL — ABNORMAL LOW (ref 3.87–5.11)
RDW: 17.2 % — ABNORMAL HIGH (ref 11.5–15.5)
WBC: 2 10*3/uL — ABNORMAL LOW (ref 4.0–10.5)
nRBC: 0 % (ref 0.0–0.2)

## 2020-06-07 LAB — MAGNESIUM: Magnesium: 1.6 mg/dL — ABNORMAL LOW (ref 1.7–2.4)

## 2020-06-08 ENCOUNTER — Inpatient Hospital Stay: Payer: Managed Care, Other (non HMO)

## 2020-06-08 ENCOUNTER — Other Ambulatory Visit: Payer: Self-pay

## 2020-06-08 ENCOUNTER — Ambulatory Visit
Admission: RE | Admit: 2020-06-08 | Discharge: 2020-06-08 | Disposition: A | Discharge status: Home / Self Care | Payer: Managed Care, Other (non HMO) | Source: Ambulatory Visit | Attending: Radiation Oncology | Admitting: Radiation Oncology

## 2020-06-08 ENCOUNTER — Encounter: Payer: Self-pay | Admitting: Genetic Counselor

## 2020-06-08 DIAGNOSIS — Z803 Family history of malignant neoplasm of breast: Secondary | ICD-10-CM | POA: Insufficient documentation

## 2020-06-08 DIAGNOSIS — Z8049 Family history of malignant neoplasm of other genital organs: Secondary | ICD-10-CM | POA: Insufficient documentation

## 2020-06-08 DIAGNOSIS — C539 Malignant neoplasm of cervix uteri, unspecified: Secondary | ICD-10-CM | POA: Diagnosis not present

## 2020-06-08 NOTE — Progress Notes (Signed)
REFERRING PROVIDER: Everitt Amber, MD [place],  [place] [address]  PRIMARY PROVIDER:  [name], MD  PRIMARY REASON FOR VISIT:  1. Cervical cancer, FIGO stage IVB ([place])   2. Family history of breast cancer   3. Family history of uterine cancer      HISTORY OF PRESENT ILLNESS:   Phyllis Hebert, a 64 y.o. female, was seen for a [place] cancer genetics consultation at the request of Dr. Denman George due to a personal and family history of cancer.  Phyllis Hebert presents to clinic today to discuss the possibility of a hereditary predisposition to cancer, genetic testing, and to further clarify her future cancer risks, as well as potential cancer risks for family members.   In September 2021, at the age of 54, Phyllis Hebert was diagnosed with Stage IVB cervical cancer. The treatment plan chemotherapy.  Currently this is being treated as cervical cancer, although she reports that there is some uncertainty.    CANCER HISTORY:  Oncology History  Cervical cancer, FIGO stage IVB ([place])  03/16/2020 Initial Diagnosis   The patient had history of new onset vaginal spotting on March 16, 2020.  She immediately called her OB/GYN's office and achieved an appointment with her OB/GYN's partner, Dr. Murrell Redden, who saw and evaluated the patient on March 19, 2020.  At that time a transvaginal ultrasound scan was performed which revealed a uterus measuring 5.3 x 8.4 x 4.6 cm with an ill-defined endometrium.  There is a complex cyst on the left ovary measuring 2.6 cm with peripheral blood flow noted.  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  Immunostains were positive for CEA and CDX2, negative for p16, ER, vimentin, and PAX8.  The differential diagnosis included primary gastric type endocervical adenocarcinoma (though this typically stains positive for PAX8) or a metastasis from  pancreaticobiliary or upper GI primary.   03/29/2020 Pathology Results   1. Surgical [P], duodenum - PEPTIC DUODENITIS. - NO DYSPLASIA OR MALIGNANCY. 2. Surgical [P], gastric antrum and gastric body - REACTIVE GASTROPATHY. Hinton Dyer IS NEGATIVE FOR HELICOBACTER PYLORI. - NO INTESTINAL METAPLASIA, DYSPLASIA, OR MALIGNANCY. 3. Surgical [P], gastric nodule - MILD REACTIVE GASTROPATHY. - NO INTESTINAL METAPLASIA, DYSPLASIA, OR MALIGNANCY. 4. Surgical [P], stomach, lesser curve ulcers - REACTIVE GASTROPATHY WITH EROSIONS. - NO INTESTINAL METAPLASIA, DYSPLASIA, OR MALIGNANCY. 5. Surgical [P], gastric polyps - HYPERPLASTIC POLYP. - NO INTESTINAL METAPLASIA, DYSPLASIA OR MALIGNANCY. 6. Surgical [P], colon, descending, polyp - HYPERPLASTIC POLYP. - NO DYSPLASIA OR MALIGNANCY.   04/06/2020 Procedure   EGD report  Normal esophagus. - A few gastric polyps. Suspected fundic gland polyps. - Non-bleeding gastric ulcers with pigmented material. Biopsied. - A single gastric polyp/nodule. Resected and retrieved. - Erythematous duodenopathy. Biopsied. - The examination was otherwise normal.   04/06/2020 Procedure   Colonoscopy report - Non-bleeding internal hemorrhoids. - One 4 mm polyp in the descending colon, removed with a cold snare. Resected and retrieved. - The examination was otherwise normal on direct and retroflexion views   04/19/2020 PET scan   1. Hypermetabolic uterine cervix mass compatible with known primary cervical malignancy, with hypermetabolism extending into the upper third of the vagina and extending throughout the uterine body. No frank extrauterine extension. 2. Mild to moderate left hydroureteronephrosis to the level of the left UVJ, concerning for tumor involvement of the left UVJ. 3. Hypermetabolic right common iliac nodal metastasis. 4. Hypermetabolic sclerotic right  ischial bone metastasis. 5. Two indeterminate small scattered solid pulmonary nodules, below  PET resolution, warranting attention on chest CT follow-up in 3 months. 6.  Aortic Atherosclerosis (ICD10-I70.0).   04/19/2020 Cancer Staging   Staging form: Cervix Uteri, AJCC Version 9 - Clinical stage from 04/19/2020: FIGO Stage IVB (cT3b, cN1a, cM1) - Signed by [name], MD on 04/19/2020   04/26/2020 Procedure   Successful placement of a right IJ approach Power Port with ultrasound and fluoroscopic guidance. The catheter is ready for use   04/30/2020 -  Chemotherapy   The patient had cisplatin for chemotherapy treatment.     Metastasis to bone ([place])  04/19/2020 Initial Diagnosis   Metastasis to bone ([facility])      RISK FACTORS:  Menarche was at age 64.  First live birth at age 19.   Ovaries intact: yes.  Hysterectomy: no.  Menopausal status: postmenopausal.  HRT use: 0 years. Colonoscopy: yes; 2-5 polyps. Mammogram within the last year: yes. Number of breast biopsies: 0. Up to date with pelvic exams: yes. Any excessive radiation exposure in the past: no  Past Medical History:  Diagnosis Date  . Cervical cancer ([place])   . Colon polyps   . Family history of breast cancer   . Family history of uterine cancer   . Gastric ulcer   . Lupus [facility])     Past Surgical History:  Procedure Laterality Date  . catarac Left   . COLONOSCOPY    . ENDOMETRIAL ABLATION  2010  . ESOPHAGOGASTRODUODENOSCOPY    . IR IMAGING GUIDED PORT INSERTION  04/26/2020    Social History   Socioeconomic History  . Marital status: Married    Spouse name: [name]  . Number of children: 2  . Years of education: Not on file  . Highest education level: Not on file  Occupational History  . Occupation: retired  Tobacco Use  . Smoking status: Never Smoker  . Smokeless tobacco: Never Used  Vaping Use  . Vaping Use: Never used  Substance and Sexual Activity  . Alcohol use: No  . Drug use: No  . Sexual activity: Not on file  Other Topics Concern  . Not on file  Social History Narrative  . Not on  file   Social Determinants of Health   Financial Resource Strain: Not on file  Food Insecurity: Not on file  Transportation Needs: Not on file  Physical Activity: Not on file  Stress: Not on file  Social Connections: Not on file     FAMILY HISTORY:  We obtained a detailed, 4-generation family history.  Significant diagnoses are listed below: Family History  Problem Relation Age of Onset  . Hypertension Mother   . Emphysema Mother   . Uterine cancer Mother 30  . Hypertension Father   . Stroke Father   . Hypertension Brother   . Healthy Daughter   . Breast cancer Maternal Aunt        d. 8  . Leukemia Cousin        d. 37s    The patient has two daughters who are cancer free. She has a brother who is cancer free.  Both parents are deceased.  The patient's father died of lung cancer.  He has one sister who is living.  She has a daughter who had leukemia and died in her 13's.  Both paternal grandparents are deceased.  The patient's mother had uterine cancer at age 18, and died at 43.  She had four brothers  and a sister.  Her sister had breast cancer over age 15.  Both maternal grandparents are deceased.  Phyllis Hebert is unaware of previous family history of genetic testing for hereditary cancer risks. Patient's maternal ancestors are of [place] descent, and paternal ancestors are of [place] descent. There is no reported Ashkenazi Jewish ancestry. There is no known consanguinity.    GENETIC COUNSELING ASSESSMENT: Phyllis Hebert is a 64 y.o. female with a personal and family history of cancer which is somewhat suggestive of a hereditary cancer syndrome, such as Lynch syndrome, and predisposition to cancer given the young age of onset of uterine cancer in her mother and the possible GI related cancer in herself. We, therefore, discussed and recommended the following at today's visit.   DISCUSSION: We discussed that 3 - 5% of uterine cancer is hereditary, with most cases associated with  Lynch syndrome.  There are other genes that can be associated with hereditary uterine cancer syndromes.  These include PTEN and others.  The patient states that her cervical cancer was originally thought to originate in the GI tract or possibly another GYN cancer, based on markers within the tumor.  She is currently a stage IVB cervical cancer.  We discussed that testing is beneficial for several reasons including knowing how to follow individuals and understand if other family members could be at risk for cancer and allow them to undergo genetic testing.   We reviewed the characteristics, features and inheritance patterns of hereditary cancer syndromes. We also discussed genetic testing, including the appropriate family members to test, the process of testing, insurance coverage and turn-around-time for results. We discussed the implications of a negative, positive, carrier and/or variant of uncertain significant result. We recommended Phyllis Hebert pursue genetic testing for the CancerNext-Expanded+RNAinsight gene panel. The CancerNext-Expanded gene panel offered by [facility] and includes sequencing and rearrangement analysis for the following 77 genes: AIP, ALK, APC*, ATM*, AXIN2, BAP1, BARD1, BLM, BMPR1A, BRCA1*, BRCA2*, BRIP1*, CDC73, CDH1*, CDK4, CDKN1B, CDKN2A, CHEK2*, CTNNA1, DICER1, FANCC, FH, FLCN, GALNT12, KIF1B, LZTR1, MAX, MEN1, MET, MLH1*, MSH2*, MSH3, MSH6*, MUTYH*, NBN, NF1*, NF2, NTHL1, PALB2*, PHOX2B, PMS2*, POT1, PRKAR1A, PTCH1, PTEN*, RAD51C*, RAD51D*, RB1, RECQL, RET, SDHA, SDHAF2, SDHB, SDHC, SDHD, SMAD4, SMARCA4, SMARCB1, SMARCE1, STK11, SUFU, TMEM127, TP53*, TSC1, TSC2, VHL and XRCC2 (sequencing and deletion/duplication); EGFR, EGLN1, HOXB13, KIT, MITF, PDGFRA, POLD1, and POLE (sequencing only); EPCAM and GREM1 (deletion/duplication only). DNA and RNA analyses performed for * genes.   Based on Phyllis Hebert's personal and family history of cancer, she meets medical criteria for genetic  testing. Despite that she meets criteria, she may still have an out of pocket cost. We discussed that if her out of pocket cost for testing is over $100, the laboratory will call and confirm whether she wants to proceed with testing.  If the out of pocket cost of testing is less than $100 she will be billed by the genetic testing laboratory.   PLAN: After considering the risks, benefits, and limitations, Phyllis Hebert provided informed consent to pursue genetic testing and the blood sample was sent to [facility] for analysis of the CancerNext-Expanded+RNAinsight. Results should be available within approximately 2-3 weeks' time, at which point they will be disclosed by telephone to Phyllis Hebert, as will any additional recommendations warranted by these results. Phyllis Hebert will receive a summary of her genetic counseling visit and a copy of her results once available. This information will also be available in Epic.   Lastly, we encouraged Ms. Resendes  to remain in contact with cancer genetics annually so that we can continuously update the family history and inform her of any changes in cancer genetics and testing that may be of benefit for this family.   Ms. Ullman questions were answered to her satisfaction today. Our contact information was provided should additional questions or concerns arise. Thank you for the referral and allowing [place] to share in the care of your patient.   Karen P. Florene Glen, [place], [facility] Licensed, Insurance risk surveyor [email or web address] phone: [phone]  The patient was seen for a total of 40 minutes in face-to-face genetic counseling.  This patient was discussed with Drs. Magrinat, Lindi Adie and/or Burr Medico who agrees with the above.    _______________________________________________________________________ For Office Staff:  Number of people involved in session: 2 Was an Intern/ student involved with case: no

## 2020-06-09 ENCOUNTER — Telehealth: Payer: Self-pay | Admitting: Hematology and Oncology

## 2020-06-09 ENCOUNTER — Inpatient Hospital Stay (HOSPITAL_BASED_OUTPATIENT_CLINIC_OR_DEPARTMENT_OTHER): Payer: Managed Care, Other (non HMO) | Admitting: Hematology and Oncology

## 2020-06-09 ENCOUNTER — Ambulatory Visit
Admission: RE | Admit: 2020-06-09 | Discharge: 2020-06-09 | Disposition: A | Discharge status: Home / Self Care | Payer: Managed Care, Other (non HMO) | Source: Ambulatory Visit | Attending: Radiation Oncology | Admitting: Radiation Oncology

## 2020-06-09 ENCOUNTER — Encounter: Payer: Self-pay | Admitting: Hematology and Oncology

## 2020-06-09 ENCOUNTER — Other Ambulatory Visit: Payer: Self-pay

## 2020-06-09 DIAGNOSIS — D61818 Other pancytopenia: Secondary | ICD-10-CM | POA: Diagnosis not present

## 2020-06-09 DIAGNOSIS — C539 Malignant neoplasm of cervix uteri, unspecified: Secondary | ICD-10-CM | POA: Diagnosis not present

## 2020-06-09 DIAGNOSIS — R110 Nausea: Secondary | ICD-10-CM | POA: Diagnosis not present

## 2020-06-09 DIAGNOSIS — N1330 Unspecified hydronephrosis: Secondary | ICD-10-CM | POA: Diagnosis not present

## 2020-06-09 MED ORDER — DEXAMETHASONE 4 MG PO TABS: 4.0000 mg | ORAL_TABLET | Freq: Every day | ORAL | 1 refills | Status: DC

## 2020-06-09 NOTE — Assessment & Plan Note (Signed)
Her renal function is stable She will continue hydration as tolerated 

## 2020-06-09 NOTE — Assessment & Plan Note (Signed)
She denies recent nausea but has poor appetite and difficulties with eating I recommend a trial of daily steroids I did warn her about risk of insomnia I will call her next week to check on her

## 2020-06-09 NOTE — Assessment & Plan Note (Signed)
She has worsening pancytopenia due to treatment She has excessive fatigue She does not need transfusion support

## 2020-06-09 NOTE — Telephone Encounter (Signed)
Scheduled appts per 12/22 los. Pt stated she would refer to mychart for AVS and appt calendar.

## 2020-06-09 NOTE — Progress Notes (Signed)
[place] OFFICE PROGRESS NOTE  Patient Care Team: [name], MD as PCP - General (Family Medicine) [name], MD as Consulting Physician (Obstetrics and Gynecology) [name], RN as Oncology Nurse Navigator (Oncology)  ASSESSMENT & PLAN:  Cervical cancer, FIGO stage IVB [facility]) She has progressive pancytopenia despite withholding chemotherapy recently which is not unexpected For now, I do not plan to resume chemotherapy until recovery of her pancytopenia We will continue to monitor her blood counts carefully and continue on aggressive supportive care  Pancytopenia, acquired [facility]) She has worsening pancytopenia due to treatment She has excessive fatigue She does not need transfusion support   Hydronephrosis of left kidney Her renal function is stable She will continue hydration as tolerated  Nausea without vomiting She denies recent nausea but has poor appetite and difficulties with eating I recommend a trial of daily steroids I did warn her about risk of insomnia I will call her next week to check on her   No orders of the defined types were placed in this encounter.   All questions were answered. The patient knows to call the clinic with any problems, questions or concerns. The total time spent in the appointment was 20 minutes encounter with patients including review of chart and various tests results, discussions about plan of care and coordination of care plan   [name], MD 06/09/2020 1:30 PM  INTERVAL HISTORY: Please see below for problem oriented charting. She returns for further follow-up She complains of persistent fatigue Appetite is poor She denies having an appetite She have occasional diarrhea alternate with constipation No recent infection, fever or chills  SUMMARY OF ONCOLOGIC HISTORY: Oncology History  Cervical cancer, FIGO stage IVB ([place])  03/16/2020 Initial Diagnosis   The patient had history of new onset  vaginal spotting on March 16, 2020.  She immediately called her OB/GYN's office and achieved an appointment with her OB/GYN's partner, Dr. Murrell Redden, who saw and evaluated the patient on March 19, 2020.  At that time a transvaginal ultrasound scan was performed which revealed a uterus measuring 5.3 x 8.4 x 4.6 cm with an ill-defined endometrium.  There is a complex cyst on the left ovary measuring 2.6 cm with peripheral blood flow noted.  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  Immunostains were positive for CEA and CDX2, negative for p16, ER, vimentin, and PAX8.  The differential diagnosis included primary gastric type endocervical adenocarcinoma (though this typically stains positive for PAX8) or a metastasis from pancreaticobiliary or upper GI primary.   03/29/2020 Pathology Results   1. Surgical [P], duodenum - PEPTIC DUODENITIS. - NO DYSPLASIA OR MALIGNANCY. 2. Surgical [P], gastric antrum and gastric body - REACTIVE GASTROPATHY. [place] IS NEGATIVE FOR HELICOBACTER PYLORI. - NO INTESTINAL METAPLASIA, DYSPLASIA, OR MALIGNANCY. 3. Surgical [P], gastric nodule - MILD REACTIVE GASTROPATHY. - NO INTESTINAL METAPLASIA, DYSPLASIA, OR MALIGNANCY. 4. Surgical [P], stomach, lesser curve ulcers - REACTIVE GASTROPATHY WITH EROSIONS. - NO INTESTINAL METAPLASIA, DYSPLASIA, OR MALIGNANCY. 5. Surgical [P], gastric polyps - HYPERPLASTIC POLYP. - NO INTESTINAL METAPLASIA, DYSPLASIA OR MALIGNANCY. 6. Surgical [P], colon, descending, polyp - HYPERPLASTIC POLYP. - NO DYSPLASIA OR MALIGNANCY.   04/06/2020 Procedure   EGD report  Normal esophagus. - A few gastric polyps. Suspected fundic gland polyps. - Non-bleeding gastric ulcers with pigmented material. Biopsied. - A single gastric polyp/nodule. Resected and retrieved. - Erythematous duodenopathy. Biopsied. - The  examination was otherwise normal.   04/06/2020 Procedure   Colonoscopy report - Non-bleeding internal hemorrhoids. - One 4 mm polyp in the descending colon, removed with a cold snare. Resected and retrieved. - The examination was otherwise normal on direct and retroflexion views   04/19/2020 PET scan   1. Hypermetabolic uterine cervix mass compatible with known primary cervical malignancy, with hypermetabolism extending into the upper third of the vagina and extending throughout the uterine body. No frank extrauterine extension. 2. Mild to moderate left hydroureteronephrosis to the level of the left UVJ, concerning for tumor involvement of the left UVJ. 3. Hypermetabolic right common iliac nodal metastasis. 4. Hypermetabolic sclerotic right ischial bone metastasis. 5. Two indeterminate small scattered solid pulmonary nodules, below PET resolution, warranting attention on chest CT follow-up in 3 months. 6.  Aortic Atherosclerosis (ICD10-I70.0).   04/19/2020 Cancer Staging   Staging form: Cervix Uteri, AJCC Version 9 - Clinical stage from 04/19/2020: FIGO Stage IVB (cT3b, cN1a, cM1) - Signed by [name], MD on 04/19/2020   04/26/2020 Procedure   Successful placement of a right IJ approach Power Port with ultrasound and fluoroscopic guidance. The catheter is ready for use   04/30/2020 -  Chemotherapy   The patient had cisplatin for chemotherapy treatment.     Metastasis to bone ([place])  04/19/2020 Initial Diagnosis   Metastasis to bone ([facility])     REVIEW OF SYSTEMS:   Constitutional: Denies fevers, chills or abnormal weight loss Eyes: Denies blurriness of vision Ears, nose, mouth, throat, and face: Denies mucositis or sore throat Respiratory: Denies cough, dyspnea or wheezes Cardiovascular: Denies palpitation, chest discomfort or lower extremity swelling Gastrointestinal:  Denies nausea, heartburn or change in bowel habits Skin: Denies abnormal skin rashes Lymphatics: Denies new  lymphadenopathy or easy bruising Neurological:Denies numbness, tingling or new weaknesses Behavioral/Psych: Mood is stable, no new changes  All other systems were reviewed with the patient and are negative.  I have reviewed the past medical history, past surgical history, social history and family history with the patient and they are unchanged from previous note.  ALLERGIES:  is allergic to penicillins.  MEDICATIONS:  Current Outpatient Medications  Medication Sig Dispense Refill  . Cholecalciferol (VITAMIN D3) 2000 units TABS Take 2 tablets by mouth daily.    . clobetasol cream (TEMOVATE) 0.05 % Apply topically. Left leg    . dexamethasone (DECADRON) 4 MG tablet Take 1 tablet (4 mg total) by mouth daily. 30 tablet 1  . fenofibrate 160 MG tablet TAKE 1 TABLET BY MOUTH EVERY DAY 30 tablet 5  . hydroxychloroquine (PLAQUENIL) 200 MG tablet 2 TABLET WITH FOOD OR MILK ONCE A DAY ORALLY 30  2  . lidocaine-prilocaine (EMLA) cream Apply to affected area once 30 g 3  . LORazepam (ATIVAN) 0.5 MG tablet Take 1 tablet (0.5 mg total) by mouth every 8 (eight) hours as needed for anxiety. 30 tablet 0  . meloxicam (MOBIC) 15 MG tablet Take 15 mg by mouth daily.    . ondansetron (ZOFRAN) 8 MG tablet Take 1 tablet (8 mg total) by mouth every 8 (eight) hours as needed. Start on the third day after cisplatin chemotherapy. 30 tablet 1  . pantoprazole (PROTONIX) 40 MG tablet Take 1 tablet (40 mg total) by mouth 2 (two) times daily. 90 tablet 3  . prochlorperazine (COMPAZINE) 10 MG tablet Take 1 tablet (10 mg total) by mouth every 6 (six) hours as needed (Nausea or vomiting). 30 tablet 1  . traMADol (ULTRAM) 50 MG tablet  Take 2 tablets (100 mg total) by mouth every 6 (six) hours as needed. 60 tablet 0   No current facility-administered medications for this visit.    PHYSICAL EXAMINATION: ECOG PERFORMANCE STATUS: 1 - Symptomatic but completely ambulatory  Vitals:   06/09/20 1217  BP: (!) 160/76  Pulse: 74   Resp: 18  Temp: 98.4 F (36.9 C)  SpO2: 100%   Filed Weights   06/09/20 1217  Weight: 206 lb 6.4 oz (93.6 kg)    GENERAL:alert, no distress and comfortable Musculoskeletal:no cyanosis of digits and no clubbing  NEURO: alert & oriented x 3 with fluent speech, no focal motor/sensory deficits  LABORATORY DATA:  I have reviewed the data as listed    Component Value Date/Time   NA 140 06/07/2020 1422   NA 140 06/21/2016 1530   K 4.1 06/07/2020 1422   CL 105 06/07/2020 1422   CO2 26 06/07/2020 1422   GLUCOSE 122 (H) 06/07/2020 1422   BUN 17 06/07/2020 1422   BUN 16 06/21/2016 1530   CREATININE 0.98 06/07/2020 1422   CREATININE 0.87 07/02/2017 1549   CALCIUM 9.4 06/07/2020 1422   PROT 6.9 06/07/2020 1422   PROT 7.3 06/21/2016 1530   ALBUMIN 3.7 06/07/2020 1422   ALBUMIN 4.3 06/21/2016 1530   AST 11 (L) 06/07/2020 1422   ALT 13 06/07/2020 1422   ALKPHOS 60 06/07/2020 1422   BILITOT 0.3 06/07/2020 1422   BILITOT 0.3 06/21/2016 1530   GFRNONAA >60 06/07/2020 1422   GFRAA 111 06/21/2016 1530    No results found for: SPEP, UPEP  Lab Results  Component Value Date   WBC 2.0 (L) 06/07/2020   NEUTROABS 1.2 (L) 06/07/2020   HGB 9.7 (L) 06/07/2020   HCT 28.7 (L) 06/07/2020   MCV 87.0 06/07/2020   PLT 114 (L) 06/07/2020      Chemistry      Component Value Date/Time   NA 140 06/07/2020 1422   NA 140 06/21/2016 1530   K 4.1 06/07/2020 1422   CL 105 06/07/2020 1422   CO2 26 06/07/2020 1422   BUN 17 06/07/2020 1422   BUN 16 06/21/2016 1530   CREATININE 0.98 06/07/2020 1422   CREATININE 0.87 07/02/2017 1549      Component Value Date/Time   CALCIUM 9.4 06/07/2020 1422   ALKPHOS 60 06/07/2020 1422   AST 11 (L) 06/07/2020 1422   ALT 13 06/07/2020 1422   BILITOT 0.3 06/07/2020 1422   BILITOT 0.3 06/21/2016 1530

## 2020-06-09 NOTE — Assessment & Plan Note (Signed)
She has progressive pancytopenia despite withholding chemotherapy recently which is not unexpected For now, I do not plan to resume chemotherapy until recovery of her pancytopenia We will continue to monitor her blood counts carefully and continue on aggressive supportive care 

## 2020-06-10 ENCOUNTER — Ambulatory Visit
Admission: RE | Admit: 2020-06-10 | Discharge: 2020-06-10 | Disposition: A | Discharge status: Home / Self Care | Payer: Managed Care, Other (non HMO) | Source: Ambulatory Visit | Attending: Radiation Oncology | Admitting: Radiation Oncology

## 2020-06-10 DIAGNOSIS — C539 Malignant neoplasm of cervix uteri, unspecified: Secondary | ICD-10-CM | POA: Diagnosis not present

## 2020-06-14 ENCOUNTER — Ambulatory Visit
Admission: RE | Admit: 2020-06-14 | Discharge: 2020-06-14 | Disposition: A | Discharge status: Home / Self Care | Payer: Managed Care, Other (non HMO) | Source: Ambulatory Visit | Attending: Radiation Oncology | Admitting: Radiation Oncology

## 2020-06-14 ENCOUNTER — Telehealth: Payer: Self-pay | Admitting: Oncology

## 2020-06-14 DIAGNOSIS — C539 Malignant neoplasm of cervix uteri, unspecified: Secondary | ICD-10-CM | POA: Diagnosis not present

## 2020-06-14 NOTE — Telephone Encounter (Signed)
Thanks for the update

## 2020-06-14 NOTE — Telephone Encounter (Signed)
Called Phyllis Hebert to see how her appetite/nausea is since starting decadron.  She said she is doing much, much better.  She has a lot more energy and was even able to do some house work this morning.  She did mention that she is taking 1/2 tablet daily and denies having any trouble sleeping.

## 2020-06-15 ENCOUNTER — Ambulatory Visit
Admission: RE | Admit: 2020-06-15 | Discharge: 2020-06-15 | Disposition: A | Discharge status: Home / Self Care | Payer: Managed Care, Other (non HMO) | Source: Ambulatory Visit | Attending: Radiation Oncology | Admitting: Radiation Oncology

## 2020-06-15 ENCOUNTER — Other Ambulatory Visit: Payer: Self-pay

## 2020-06-15 DIAGNOSIS — C539 Malignant neoplasm of cervix uteri, unspecified: Secondary | ICD-10-CM | POA: Diagnosis not present

## 2020-06-16 ENCOUNTER — Encounter (HOSPITAL_BASED_OUTPATIENT_CLINIC_OR_DEPARTMENT_OTHER): Payer: Self-pay | Admitting: Radiation Oncology

## 2020-06-16 ENCOUNTER — Ambulatory Visit
Admission: RE | Admit: 2020-06-16 | Discharge: 2020-06-16 | Disposition: A | Discharge status: Home / Self Care | Payer: Managed Care, Other (non HMO) | Source: Ambulatory Visit | Attending: Radiation Oncology | Admitting: Radiation Oncology

## 2020-06-16 ENCOUNTER — Other Ambulatory Visit: Payer: Self-pay

## 2020-06-16 DIAGNOSIS — C539 Malignant neoplasm of cervix uteri, unspecified: Secondary | ICD-10-CM | POA: Diagnosis not present

## 2020-06-16 LAB — BASIC METABOLIC PANEL - CANCER CENTER ONLY
Anion gap: 6 (ref 5–15)
BUN: 19 mg/dL (ref 8–23)
CO2: 29 mmol/L (ref 22–32)
Calcium: 9.3 mg/dL (ref 8.9–10.3)
Chloride: 106 mmol/L (ref 98–111)
Creatinine: 0.97 mg/dL (ref 0.44–1.00)
GFR, Estimated: >60 mL/min (ref >60)
Glucose, Bld: 166 mg/dL — ABNORMAL HIGH (ref 70–99)
Potassium: 3.8 mmol/L (ref 3.5–5.1)
Sodium: 141 mmol/L (ref 135–145)

## 2020-06-16 LAB — CBC WITH DIFFERENTIAL (CANCER CENTER ONLY)
Abs Immature Granulocytes: 0.17 10*3/uL — ABNORMAL HIGH (ref 0.00–0.07)
Basophils Absolute: 0 10*3/uL (ref 0.0–0.1)
Basophils Relative: 1 %
Eosinophils Absolute: 0.1 10*3/uL (ref 0.0–0.5)
Eosinophils Relative: 1 %
HCT: 28.4 % — ABNORMAL LOW (ref 36.0–46.0)
Hemoglobin: 9.5 g/dL — ABNORMAL LOW (ref 12.0–15.0)
Immature Granulocytes: 4 %
Lymphocytes Relative: 11 %
Lymphs Abs: 0.5 10*3/uL — ABNORMAL LOW (ref 0.7–4.0)
MCH: 29.5 pg (ref 26.0–34.0)
MCHC: 33.5 g/dL (ref 30.0–36.0)
MCV: 88.2 fL (ref 80.0–100.0)
Monocytes Absolute: 0.3 10*3/uL (ref 0.1–1.0)
Monocytes Relative: 8 %
Neutro Abs: 3.3 10*3/uL (ref 1.7–7.7)
Neutrophils Relative %: 75 %
Platelet Count: 162 10*3/uL (ref 150–400)
RBC: 3.22 MIL/uL — ABNORMAL LOW (ref 3.87–5.11)
RDW: 18.6 % — ABNORMAL HIGH (ref 11.5–15.5)
WBC Count: 4.3 10*3/uL (ref 4.0–10.5)
nRBC: 0.9 % — ABNORMAL HIGH (ref 0.0–0.2)

## 2020-06-16 NOTE — Progress Notes (Signed)
Spoke w/ via phone for pre-op interview--- PT Lab needs dos---- no              Lab results------ pt had CBCdiff / CMP done 06-07-2020 results in epic COVID test ------ 06-18-2020 @ 1130 Arrive at ------- 0630 NPO after MN NO Solid Food.  Clear liquids from MN until--- 0530 Medications to take morning of surgery ----- Protonix Diabetic medication ----- n/a Patient Special Instructions ----- reviewed process of tandem procedure @ [facility] and then going to cancer center after procedure Pre-Op special Istructions ----- n/a Patient verbalized understanding of instructions that were given at this phone interview. Patient denies shortness of breath, chest pain, fever, cough at this phone interview.

## 2020-06-17 NOTE — Progress Notes (Signed)
Patient instructed to arrive at 0600 on 06/21/2020. 

## 2020-06-18 ENCOUNTER — Other Ambulatory Visit (HOSPITAL_COMMUNITY)
Admission: RE | Admit: 2020-06-18 | Discharge: 2020-06-18 | Disposition: A | Discharge status: Home / Self Care | Payer: Managed Care, Other (non HMO) | Source: Ambulatory Visit | Attending: Radiation Oncology | Admitting: Radiation Oncology

## 2020-06-18 DIAGNOSIS — Z20822 Contact with and (suspected) exposure to covid-19: Secondary | ICD-10-CM | POA: Diagnosis not present

## 2020-06-18 DIAGNOSIS — Z01812 Encounter for preprocedural laboratory examination: Secondary | ICD-10-CM | POA: Insufficient documentation

## 2020-06-18 LAB — SARS CORONAVIRUS 2 (TAT 6-24 HRS): SARS Coronavirus 2: NEGATIVE

## 2020-06-20 NOTE — H&P (Addendum)
Radiation Oncology         [phone] ________________________________  History and physical examination  Name: Phyllis Hebert MRN: [id number]  Date: 06/20/2020  DOB: 25-Jun-1955  [id number], Phyllis Millet, MD  No ref. provider found   REFERRING PHYSICIAN: No ref. provider found  DIAGNOSIS: Stage IVB (cT3b, cN1a, cM1) adenocarcinoma of the cervix  HISTORY OF PRESENT ILLNESS::Phyllis Hebert is a 65 y.o. female who is seen as a courtesy of Dr. Denman George for an opinion concerning radiation therapy as part of management for her recently diagnosed cervical cancer. The patient presented to Dr. Murrell Redden, OB-GYN, on 04/19/2020 with complaint of new onset postmenopausal bleeding. Transvaginal ultrasound performed at that time showed a uterus that measured 5.3 x 8.4 x 4.6 cm with an ill-defined endometrium. There was also noted to be a complex cyst on the left ovary that measured 2.6 cm with peripheral blood flow. Physical exam identified a cervical mass that was biopsied on that same day and revealed invasive adenocarcinoma involving the fibrous stroma. Given that immunostains were positive for CEA and CDX2, differential diagnoses included primary gastric type endocervical adenocarcinoma versus a metastasis from pancreaticobiliary or upper GI primary.   The patient was referred to Dr. Denman George and was seen in consultation on 04/01/2020. At that time, it was recommended that she proceed with further diagnostic work-up including a PET scan and evaluation from gastroenterology for consideration of EGD. If no primary upper gastrointestinal lesion was identified, then a presumptive diagnosis of a primary gastric type endocervical adenocarcinoma would be considered.  The patient was referred to Dr. Tarri Glenn and underwent an EGD and colonoscopy on 04/06/2020. Pathology from the procedure did not reveal any dysplasia, malignancy, or intestinal metaplasia of the duodenum, gastric antrum, gastric body, gastrice nodule,  lesser curve ulcers of the stomach, gastric polyps, and descending colon polyp.  PET scan on 04/19/2020 showed a hypermetabolic uterine cervix mass that was compatible with the known primary cervical malignancy, with hypermetabolism extending into the upper third of the vagina and extending throughout the uterine body. There was no frank extrauterine extension. However, there was noted to be mild to moderate left hydroureteronephrosis to the level of the left UVJ, concerning for tumor involvement of the left UVJ. Additionally, there was hypermetabolic sclerotic right ischial bone metastasis and two indeterminate small scattered slid pulmonary nodules that were below PET resolution but still warranted attention on follow-up chest CT scan in three months.   The patient was seen in consultation with Dr. Alvy Bimler on 04/19/2020, during which time it was recommended that she proceed with palliative treatment with Cisplatin.   The patient has recently completed her external beam radiation therapy and radiosensitizing chemotherapy.  She is now ready to proceed with her first intracavitary brachytherapy treatment using iridium 192 as the high-dose-rate source.  PREVIOUS RADIATION THERAPY: No  PAST MEDICAL HISTORY:  Past Medical History:  Diagnosis Date  . Anemia   . Anxiety   . Arthritis   . Cervical cancer, FIGO stage IVB [facility]) oncologist--- dr gorsuch/ dr Sondra Come   dx inital dx 09/ 2021, w/ mets to bone, started chemo 04-30-2020 and stopped 05-21-2020 due to worsening pancytopenia;  completed IMRT 06-16-2020 and scheduled to start brachytherapy boost high dose 06-21-2020  . Chemotherapy-induced nausea   . Cutaneous lupus erythematosus    followed by [facility]-- Marella Chimes PA  . Dyslipidemia   . Family history of breast cancer   . Family history of uterine cancer   . GERD (gastroesophageal reflux  disease)   . Hemorrhoids   . History of 2019 novel coronavirus disease (COVID-19) 06/20/2019   [facility]  [facility] , had mild symptoms that resolved  . History of colon polyps 04/06/2020   hyerplastic  . History of external beam radiation therapy 05-03-2020  to 06-16-2020   cervical cancer  . History of gastric polyp 04/06/2020   hyperplastic  . History of gastric ulcer   . Pancytopenia due to chemotherapy ([place])   . Wears glasses     PAST SURGICAL HISTORY: Past Surgical History:  Procedure Laterality Date  . CATARACT EXTRACTION W/ INTRAOCULAR LENS IMPLANT Left 12/2019  . COLONOSCOPY WITH ESOPHAGOGASTRODUODENOSCOPY (EGD)  04-06-2020  dr Raliegh Ip. [facility]a[place]  . ENDOMETRIAL ABLATION W/ NOVASURE  2010  . IR IMAGING GUIDED PORT INSERTION  04/26/2020    FAMILY HISTORY:  Family History  Problem Relation Age of Onset  . Hypertension Mother   . Emphysema Mother   . Uterine cancer Mother 85  . Hypertension Father   . Stroke Father   . Hypertension Brother   . Healthy Daughter   . Breast cancer Maternal Aunt        d. 70  . Leukemia Cousin        d. 62s    SOCIAL HISTORY:  Social History   Tobacco Use  . Smoking status: Never Smoker  . Smokeless tobacco: Never Used  Vaping Use  . Vaping Use: Never used  Substance Use Topics  . Alcohol use: No  . Drug use: Never    ALLERGIES:  Allergies  Allergen Reactions  . Penicillins Rash    MEDICATIONS:  No current facility-administered medications for this encounter.   Current Outpatient Medications  Medication Sig Dispense Refill  . Cholecalciferol (VITAMIN D3) 2000 units TABS Take 2 tablets by mouth daily.    . clobetasol cream (TEMOVATE) 0.05 % Apply topically as needed. Left leg    . dexamethasone (DECADRON) 4 MG tablet Take 1 tablet (4 mg total) by mouth daily. 30 tablet 1  . fenofibrate 160 MG tablet TAKE 1 TABLET BY MOUTH EVERY DAY (Patient taking differently: Take 160 mg by mouth daily with lunch.) 30 tablet 5  . LORazepam (ATIVAN) 0.5 MG tablet Take 1 tablet (0.5 mg total) by mouth every 8 (eight) hours as needed  for anxiety. 30 tablet 0  . ondansetron (ZOFRAN) 8 MG tablet Take 1 tablet (8 mg total) by mouth every 8 (eight) hours as needed. Start on the third day after cisplatin chemotherapy. 30 tablet 1  . pantoprazole (PROTONIX) 40 MG tablet Take 1 tablet (40 mg total) by mouth 2 (two) times daily. 90 tablet 3  . prochlorperazine (COMPAZINE) 10 MG tablet Take 1 tablet (10 mg total) by mouth every 6 (six) hours as needed (Nausea or vomiting). 30 tablet 1  . traMADol (ULTRAM) 50 MG tablet Take 2 tablets (100 mg total) by mouth every 6 (six) hours as needed. 60 tablet 0  . hydroxychloroquine (PLAQUENIL) 200 MG tablet 2 TABLET WITH FOOD OR MILK ONCE A DAY ORALLY 30 (Patient not taking: Reported on 06/16/2020)  2  . lidocaine-prilocaine (EMLA) cream Apply to affected area once 30 g 3    REVIEW OF SYSTEMS:  A 10+ POINT REVIEW OF SYSTEMS WAS OBTAINED including neurology, dermatology, psychiatry, cardiac, respiratory, lymph, extremities, GI, GU, musculoskeletal, constitutional, reproductive, HEENT.  The patient denies any significant pelvic pain.  She has noticed some discomfort since her PET scan primarily in the upper buttocks/sacrum area.  She denies any significant vaginal bleeding at this time.   PHYSICAL EXAM:  height is 5' 4.5" (1.638 m) and weight is 205 lb 0.4 oz (93 kg).   General: Alert and oriented, in no acute distress HEENT: Head is normocephalic. Extraocular movements are intact. Oropharynx is clear. Neck: Neck is supple, no palpable cervical or supraclavicular lymphadenopathy. Heart: Regular in rate and rhythm with no murmurs, rubs, or gallops. Chest: Clear to auscultation bilaterally, with no rhonchi, wheezes, or rales. Abdomen: Soft, nontender, nondistended, with no rigidity or guarding. Extremities: No cyanosis or edema. Lymphatics: see Neck Exam Skin: No concerning lesions. Musculoskeletal: symmetric strength and muscle tone throughout. Neurologic: Cranial nerves II through XII are grossly  intact. No obvious focalities. Speech is fluent. Coordination is intact. Psychiatric: Judgment and insight are intact. Affect is appropriate. On pelvic exam the cervix is replaced by an approximately 4 to 5 cm hard mass.  This mass bleeds easily with examination.  On rectovaginal examination there appears to be bilateral parametrial involvement more so along the right side.  No obvious sidewall involvement  ECOG = 1   LABORATORY DATA:  Lab Results  Component Value Date   WBC 4.3 06/16/2020   HGB 9.5 (L) 06/16/2020   HCT 28.4 (L) 06/16/2020   MCV 88.2 06/16/2020   PLT 162 06/16/2020   NEUTROABS 3.3 06/16/2020   Lab Results  Component Value Date   NA 141 06/16/2020   K 3.8 06/16/2020   CL 106 06/16/2020   CO2 29 06/16/2020   GLUCOSE 166 (H) 06/16/2020   CREATININE 0.97 06/16/2020   CALCIUM 9.3 06/16/2020       IMPRESSION: Stage IVB (cT3b, cN1a, cM1) adenocarcinoma of the cervix  The patient has an isolated metastatic deposit in the right ischial region.  I discussed this issue with Dr. Andrey Farmer and Dr. Bertis Ruddy.  Given the patient's excellent performance status and minimal metastatic disease we will proceed with an aggressive course of radiation including external beam radiation therapy directed at the pelvis which will encompass the sclerotic metastasis in the right pelvis region.  This will also be accompanied by radiosensitizing weekly cisplatin-based radiosensitizing chemotherapy.  Patient will then proceed with brachytherapy.  Her case will be discussed at the multidisciplinary oncologic oncology conference early next week for further input.  This will address the anticipated number of brachytherapy procedures do prior to proceeding with  full dose chemotherapy.  Patient has recently completed her external beam and radiosensitizing chemotherapy.  Due to low blood counts some of her radiosensitizing chemotherapy sessions were cancelled.  Recent blood work shows the patient is  recovering with her blood work and is now ready to proceed with her first brachytherapy session.  The patient is to receive a total of 5 intracavitary brachytherapy treatments directed at the cervix using iridium 192 is the high-dose-rate source    PLAN: The patient will be taken to the operating room on January 4 at 830 for her first brachytherapy procedure. She will have exam under anesthesia,  dilation of the cervical os followed by placement of a tandem ring apparatus in preparation for high-dose-rate treatment.  ------------------------------------------------  Billie Lade, PhD, MD

## 2020-06-21 ENCOUNTER — Ambulatory Visit (HOSPITAL_BASED_OUTPATIENT_CLINIC_OR_DEPARTMENT_OTHER): Payer: Managed Care, Other (non HMO) | Admitting: Anesthesiology

## 2020-06-21 ENCOUNTER — Encounter: Payer: Self-pay | Admitting: Genetic Counselor

## 2020-06-21 ENCOUNTER — Encounter (HOSPITAL_BASED_OUTPATIENT_CLINIC_OR_DEPARTMENT_OTHER)
Admission: RE | Disposition: A | Discharge status: Home / Self Care | Payer: Self-pay | Source: Home / Self Care | Attending: Radiation Oncology

## 2020-06-21 ENCOUNTER — Telehealth: Payer: Self-pay | Admitting: Genetic Counselor

## 2020-06-21 ENCOUNTER — Ambulatory Visit
Admission: RE | Admit: 2020-06-21 | Discharge: 2020-06-21 | Disposition: A | Discharge status: Home / Self Care | Payer: Managed Care, Other (non HMO) | Source: Ambulatory Visit | Attending: Radiation Oncology | Admitting: Radiation Oncology

## 2020-06-21 ENCOUNTER — Encounter (HOSPITAL_BASED_OUTPATIENT_CLINIC_OR_DEPARTMENT_OTHER): Payer: Self-pay | Admitting: Radiation Oncology

## 2020-06-21 ENCOUNTER — Ambulatory Visit (HOSPITAL_BASED_OUTPATIENT_CLINIC_OR_DEPARTMENT_OTHER)
Admission: RE | Admit: 2020-06-21 | Discharge: 2020-06-21 | Disposition: A | Discharge status: Home / Self Care | Payer: Managed Care, Other (non HMO) | Attending: Radiation Oncology | Admitting: Radiation Oncology

## 2020-06-21 ENCOUNTER — Ambulatory Visit (HOSPITAL_COMMUNITY)
Admission: RE | Admit: 2020-06-21 | Discharge: 2020-06-21 | Disposition: A | Discharge status: Home / Self Care | Payer: Managed Care, Other (non HMO) | Source: Ambulatory Visit | Attending: Radiation Oncology | Admitting: Radiation Oncology

## 2020-06-21 DIAGNOSIS — Z880 Allergy status to penicillin: Secondary | ICD-10-CM | POA: Diagnosis not present

## 2020-06-21 DIAGNOSIS — C539 Malignant neoplasm of cervix uteri, unspecified: Secondary | ICD-10-CM

## 2020-06-21 DIAGNOSIS — N1330 Unspecified hydronephrosis: Secondary | ICD-10-CM

## 2020-06-21 DIAGNOSIS — Z299 Encounter for prophylactic measures, unspecified: Secondary | ICD-10-CM

## 2020-06-21 DIAGNOSIS — C7951 Secondary malignant neoplasm of bone: Secondary | ICD-10-CM | POA: Insufficient documentation

## 2020-06-21 DIAGNOSIS — Z1379 Encounter for other screening for genetic and chromosomal anomalies: Secondary | ICD-10-CM | POA: Insufficient documentation

## 2020-06-21 DIAGNOSIS — Z7952 Long term (current) use of systemic steroids: Secondary | ICD-10-CM | POA: Insufficient documentation

## 2020-06-21 DIAGNOSIS — Z510 Encounter for antineoplastic radiation therapy: Secondary | ICD-10-CM | POA: Insufficient documentation

## 2020-06-21 DIAGNOSIS — R030 Elevated blood-pressure reading, without diagnosis of hypertension: Secondary | ICD-10-CM

## 2020-06-21 DIAGNOSIS — Z79899 Other long term (current) drug therapy: Secondary | ICD-10-CM | POA: Diagnosis not present

## 2020-06-21 HISTORY — DX: Unspecified osteoarthritis, unspecified site: M19.90

## 2020-06-21 HISTORY — DX: Personal history of irradiation: Z92.3

## 2020-06-21 HISTORY — DX: Malignant neoplasm of cervix uteri, unspecified: C53.9

## 2020-06-21 HISTORY — DX: Anemia, unspecified: D64.9

## 2020-06-21 HISTORY — DX: Personal history of peptic ulcer disease: Z87.11

## 2020-06-21 HISTORY — DX: Anxiety disorder, unspecified: F41.9

## 2020-06-21 HISTORY — DX: Other local lupus erythematosus: L93.2

## 2020-06-21 HISTORY — PX: OPERATIVE ULTRASOUND: SHX5996

## 2020-06-21 HISTORY — DX: Presence of spectacles and contact lenses: Z97.3

## 2020-06-21 HISTORY — DX: Unspecified hemorrhoids: K64.9

## 2020-06-21 HISTORY — DX: Antineoplastic chemotherapy induced pancytopenia: D61.810

## 2020-06-21 HISTORY — PX: TANDEM RING INSERTION: SHX6199

## 2020-06-21 HISTORY — DX: Adverse effect of antineoplastic and immunosuppressive drugs, initial encounter: T45.1X5A

## 2020-06-21 HISTORY — DX: Nausea: R11.0

## 2020-06-21 HISTORY — DX: Hyperlipidemia, unspecified: E78.5

## 2020-06-21 HISTORY — DX: Gastro-esophageal reflux disease without esophagitis: K21.9

## 2020-06-21 SURGERY — INSERTION, UTERINE TANDEM AND RING OR CYLINDER, FOR BRACHYTHERAPY
Anesthesia: General | Site: Vagina

## 2020-06-21 MED ORDER — FENTANYL CITRATE (PF) 100 MCG/2ML IJ SOLN
25.0000 ug | INTRAMUSCULAR | Status: DC | PRN
  Administered 2020-06-21 (×2): 50 ug via INTRAVENOUS

## 2020-06-21 MED ORDER — MIDAZOLAM HCL 2 MG/2ML IJ SOLN
INTRAMUSCULAR | Status: DC | PRN
  Administered 2020-06-21: 2 mg via INTRAVENOUS

## 2020-06-21 MED ORDER — SODIUM CHLORIDE 0.9 % IR SOLN
Status: DC | PRN
  Administered 2020-06-21: 100 mL

## 2020-06-21 MED ORDER — KETOROLAC TROMETHAMINE 30 MG/ML IJ SOLN
INTRAMUSCULAR | Status: DC | PRN
  Administered 2020-06-21: 30 mg via INTRAVENOUS

## 2020-06-21 MED ORDER — KETOROLAC TROMETHAMINE 30 MG/ML IJ SOLN: 30.0000 mg | Freq: Once | INTRAMUSCULAR | Status: AC | PRN

## 2020-06-21 MED ORDER — PROMETHAZINE HCL 25 MG/ML IJ SOLN: 6.2500 mg | INTRAMUSCULAR | Status: DC | PRN

## 2020-06-21 MED ORDER — HYDROMORPHONE HCL 1 MG/ML IJ SOLN
INTRAMUSCULAR | Status: AC
  Filled 2020-06-21: qty 1

## 2020-06-21 MED ORDER — DEXAMETHASONE SODIUM PHOSPHATE 10 MG/ML IJ SOLN
INTRAMUSCULAR | Status: DC | PRN
  Administered 2020-06-21: 10 mg via INTRAVENOUS

## 2020-06-21 MED ORDER — HYDROMORPHONE HCL 1 MG/ML IJ SOLN
0.5000 mg | Freq: Once | INTRAMUSCULAR | Status: AC
Start: 2020-06-21 — End: 2020-06-21
  Administered 2020-06-21: 0.5 mg via INTRAVENOUS
  Filled 2020-06-21: qty 1

## 2020-06-21 MED ORDER — LIDOCAINE HCL (PF) 2 % IJ SOLN
INTRAMUSCULAR | Status: AC
  Filled 2020-06-21: qty 5

## 2020-06-21 MED ORDER — ACETAMINOPHEN 10 MG/ML IV SOLN
INTRAVENOUS | Status: DC | PRN
  Administered 2020-06-21: 1000 mg via INTRAVENOUS

## 2020-06-21 MED ORDER — OXYCODONE HCL 5 MG/5ML PO SOLN: 5.0000 mg | Freq: Once | ORAL | Status: DC | PRN

## 2020-06-21 MED ORDER — LACTATED RINGERS IV SOLN: INTRAVENOUS | Status: DC

## 2020-06-21 MED ORDER — POVIDONE-IODINE 10 % EX SWAB: 2.0000 "application " | Freq: Once | CUTANEOUS | Status: DC

## 2020-06-21 MED ORDER — ACETAMINOPHEN 10 MG/ML IV SOLN
INTRAVENOUS | Status: AC
  Filled 2020-06-21: qty 100

## 2020-06-21 MED ORDER — ARTIFICIAL TEARS OPHTHALMIC OINT
TOPICAL_OINTMENT | OPHTHALMIC | Status: AC
  Filled 2020-06-21: qty 3.5

## 2020-06-21 MED ORDER — FENTANYL CITRATE (PF) 100 MCG/2ML IJ SOLN
INTRAMUSCULAR | Status: AC
  Filled 2020-06-21: qty 2

## 2020-06-21 MED ORDER — EPHEDRINE SULFATE-NACL 50-0.9 MG/10ML-% IV SOSY
PREFILLED_SYRINGE | INTRAVENOUS | Status: DC | PRN
  Administered 2020-06-21: 10 mg via INTRAVENOUS

## 2020-06-21 MED ORDER — PROPOFOL 10 MG/ML IV BOLUS
INTRAVENOUS | Status: AC
  Filled 2020-06-21: qty 40

## 2020-06-21 MED ORDER — FENTANYL CITRATE (PF) 100 MCG/2ML IJ SOLN
INTRAMUSCULAR | Status: DC | PRN
  Administered 2020-06-21: 25 ug via INTRAVENOUS
  Administered 2020-06-21: 50 ug via INTRAVENOUS
  Administered 2020-06-21: 25 ug via INTRAVENOUS

## 2020-06-21 MED ORDER — OXYCODONE HCL 5 MG PO TABS: 5.0000 mg | ORAL_TABLET | Freq: Once | ORAL | Status: DC | PRN

## 2020-06-21 MED ORDER — ONDANSETRON HCL 4 MG/2ML IJ SOLN
INTRAMUSCULAR | Status: DC | PRN
  Administered 2020-06-21: 4 mg via INTRAVENOUS

## 2020-06-21 MED ORDER — KETOROLAC TROMETHAMINE 30 MG/ML IJ SOLN
INTRAMUSCULAR | Status: AC
  Filled 2020-06-21: qty 1

## 2020-06-21 MED ORDER — ONDANSETRON HCL 4 MG/2ML IJ SOLN
INTRAMUSCULAR | Status: AC
  Filled 2020-06-21: qty 2

## 2020-06-21 MED ORDER — LIDOCAINE 2% (20 MG/ML) 5 ML SYRINGE
INTRAMUSCULAR | Status: DC | PRN
  Administered 2020-06-21: 80 mg via INTRAVENOUS

## 2020-06-21 MED ORDER — MIDAZOLAM HCL 2 MG/2ML IJ SOLN
INTRAMUSCULAR | Status: AC
  Filled 2020-06-21: qty 2

## 2020-06-21 MED ORDER — EPHEDRINE 5 MG/ML INJ
INTRAVENOUS | Status: AC
  Filled 2020-06-21: qty 10

## 2020-06-21 MED ORDER — PROPOFOL 10 MG/ML IV BOLUS
INTRAVENOUS | Status: DC | PRN
  Administered 2020-06-21: 150 mg via INTRAVENOUS

## 2020-06-21 MED ORDER — DEXAMETHASONE SODIUM PHOSPHATE 10 MG/ML IJ SOLN
INTRAMUSCULAR | Status: AC
  Filled 2020-06-21: qty 1

## 2020-06-21 SURGICAL SUPPLY — 17 items
COVER WAND RF STERILE (DRAPES) ×3 IMPLANT
DRSG PAD ABDOMINAL 8X10 ST (GAUZE/BANDAGES/DRESSINGS) ×3 IMPLANT
GLOVE BIO SURGEON STRL SZ 6.5 (GLOVE) ×6 IMPLANT
GLOVE BIO SURGEON STRL SZ7.5 (GLOVE) ×6 IMPLANT
GLOVE SURG UNDER POLY LF SZ7 (GLOVE) ×9 IMPLANT
GOWN STRL REUS W/TWL LRG LVL3 (GOWN DISPOSABLE) ×9 IMPLANT
HOLDER FOLEY CATH W/STRAP (MISCELLANEOUS) ×3 IMPLANT
IV NS 1000ML (IV SOLUTION) ×3
IV NS 1000ML BAXH (IV SOLUTION) ×2 IMPLANT
IV SET EXTENSION GRAVITY 40 LF (IV SETS) ×3 IMPLANT
KIT TURNOVER CYSTO (KITS) ×3 IMPLANT
LEGGING LITHOTOMY PAIR STRL (DRAPES) ×3 IMPLANT
MAT PREVALON FULL STRYKER (MISCELLANEOUS) ×3 IMPLANT
NS IRRIG 500ML POUR BTL (IV SOLUTION) ×3 IMPLANT
PACK VAGINAL MINOR WOMEN LF (CUSTOM PROCEDURE TRAY) ×3 IMPLANT
TOWEL OR 17X26 10 PK STRL BLUE (TOWEL DISPOSABLE) ×3 IMPLANT
TRAY FOLEY W/BAG SLVR 14FR LF (SET/KITS/TRAYS/PACK) ×3 IMPLANT

## 2020-06-21 NOTE — Transfer of Care (Signed)
Immediate Anesthesia Transfer of Care Note  Patient: Samuella Bruin  Procedure(s) Performed: Procedure(s) (LRB): TANDEM RING INSERTION (N/A) OPERATIVE ULTRASOUND (N/A)  Patient Location: PACU  Anesthesia Type: General  Level of Consciousness: awake, alert  and oriented  Airway & Oxygen Therapy: Patient Spontanous Breathing and Patient connected to nasal cannula oxygen  Post-op Assessment: Report given to PACU RN and Post -op Vital signs reviewed and stable  Post vital signs: Reviewed and stable  Complications: No apparent anesthesia complications Last Vitals:  Vitals Value Taken Time  BP 142/65 06/21/20 1001  Temp    Pulse 71 06/21/20 1002  Resp 17 06/21/20 1002  SpO2 100 % 06/21/20 1002  Vitals shown include unvalidated device data.  Last Pain:  Vitals:   06/21/20 0622  PainSc: 0-No pain      Patients Stated Pain Goal: 6 (06/21/20 0622)  Complications: No complications documented.

## 2020-06-21 NOTE — Anesthesia Postprocedure Evaluation (Signed)
Anesthesia Post Note  Patient: SELISA TENSLEY  Procedure(s) Performed: TANDEM RING INSERTION (N/A Vagina ) OPERATIVE ULTRASOUND (N/A Abdomen)     Patient location during evaluation: PACU Anesthesia Type: General Level of consciousness: awake and alert Pain management: pain level controlled Vital Signs Assessment: post-procedure vital signs reviewed and stable Respiratory status: spontaneous breathing, nonlabored ventilation, respiratory function stable and patient connected to nasal cannula oxygen Cardiovascular status: blood pressure returned to baseline and stable Postop Assessment: no apparent nausea or vomiting Anesthetic complications: no   No complications documented.  Last Vitals:  Vitals:   06/21/20 0622 06/21/20 1004  BP: (!) 168/87 (!) 142/65  Pulse: 77 70  Resp: 17 17  Temp: 36.6 C 36.5 C  SpO2: 98% 100%    Last Pain:  Vitals:   06/21/20 1004  TempSrc: Oral  PainSc:                  ROSE,GEORGE S

## 2020-06-21 NOTE — Interval H&P Note (Signed)
History and Physical Interval Note:  06/21/2020 8:18 AM  Phyllis Hebert  has presented today for surgery, with the diagnosis of CERVICAL CANCER.  The various methods of treatment have been discussed with the patient and family. After consideration of risks, benefits and other options for treatment, the patient has consented to  Procedure(s) with comments: TANDEM RING INSERTION (N/A) OPERATIVE ULTRASOUND (N/A) - TECH as a surgical intervention.  The patient's history has been reviewed, patient examined, no change in status, stable for surgery.  I have reviewed the patient's chart and labs.  Questions were answered to the patient's satisfaction.     Antony Blackbird

## 2020-06-21 NOTE — Progress Notes (Signed)
  Radiation Oncology         [phone] ________________________________  Name: Phyllis Hebert MRN: [id number]  Date: 06/21/2020  DOB: 1956/01/13  CC: [name], MD  [name], MD  HDR BRACHYTHERAPY NOTE  DIAGNOSIS: Stage IVB (cT3b, cN1a, cM1) adenocarcinoma of the cervix  Simple treatment device note: On the operating room the patient had construction of her custom tandem cylinder system. She will be treated with a 45 tandem/ring system with a 6.0 cm  tandem. This conforms to her anatomy without undue discomfort. A rectal paddle was also part of her custom set up device.  Vaginal brachytherapy procedure node: The patient was brought to the HDR suite. Identity was confirmed. All relevant records and images related to the planned course of therapy were reviewed. The patient freely provided informed written consent to proceed with treatment after reviewing the details related to the planned course of therapy. The consent form was witnessed and verified by the simulation staff. Then, the patient was set-up in a stable reproducible supine position for radiation therapy. The tandem cylinder system was accessed and fiducial markers were placed within the tandem and cylinder.   Verification simulation note: An AP and lateral film was obtained through the pelvis area. This was compared to the patient's planning films documenting accurate position of the tandem/cylinder system for treatment.  High-dose-rate brachytherapy treatment note:   The remote afterloading device was accessed through catheter system and attached to the tandem cylinder system. Patient then proceeded to undergo her first high-dose-rate treatment directed at the cervix. The patient was prescribed a dose of 5.5 gray to be delivered to the high risk clinical target volume.  Patient was treated with 2 channels using 26 dwell positions. Treatment time was 695.3 seconds. The patient tolerated the procedure well.  After completion of her therapy, a radiation survey was performed documenting return of the iridium source into the [employer] safe. The patient was then transferred to the nursing suite.  She then had removal of the rectal paddle followed by the tandem ring system. The patient tolerated the removal well.  PLAN: The patient will return next week for her second high-dose-rate treatment. ________________________________     Billie Lade, PhD, MD  This document serves as a record of services personally performed by Antony Blackbird, MD. It was created on his behalf by Nikki Dom, a trained medical scribe. The creation of this record is based on the scribe's personal observations and the provider's statements to them. This document has been checked and approved by the attending provider.

## 2020-06-21 NOTE — Progress Notes (Signed)
Patient transported from [facility] PACU to CT Simulation via stretcher with Gloriajean Dell, NT. Patient denies any pain. 1130 am 1000cc LR hung at 125 cc/hr. 1155 am Patient brought to room 1 in Radiation Oncology clinic for Observation. Patient denies any pain.Patient sleepy but oriented x 4. HOB increased to 20 degrees. IV of LR attached to Alaris IV pump. IV infusing at patient 's left hand without problems at 125cc/hr. SCD stockings connected to SCD pump. T 97.6 P 65 R 18 BP 138/77Sat 97 % 1250 Patient sleeping. 1335 Patient transported to HDR room for procedure by RT staff. 1415 Patient back in room 1 and given her lunch.  1439 Dilaudid 0.5 mg given IV per vo Dr. Roselind Messier to ensure her comfort with the Tandem ring removal. 1450 Foley catheter removed with tip intact. 1500 Tandem ring removed by Dr. Roselind Messier and patient tolerated it well. She denies any pain post removal.  1510 IV removed with tip intact from patient's left hand. 1525 Patient d/c'd via w/c to the first floor for her husband to take her home by car by Gloriajean Dell, NT.  Patient had a total of 350 cc of LR in while in Rad Oncology clinic PO intake 240 cc of fluid  Urine output 1050 cc

## 2020-06-21 NOTE — Anesthesia Preprocedure Evaluation (Signed)
Anesthesia Evaluation  Patient identified by MRN, date of birth, ID band Patient awake    Reviewed: Allergy & Precautions, H&P , NPO status , Patient's Chart, lab work & pertinent test results  Airway Mallampati: II  TM Distance: >3 FB Neck ROM: Full    Dental no notable dental hx.    Pulmonary neg pulmonary ROS,    Pulmonary exam normal breath sounds clear to auscultation       Cardiovascular negative cardio ROS Normal cardiovascular exam Rhythm:Regular Rate:Normal     Neuro/Psych negative neurological ROS  negative psych ROS   GI/Hepatic Neg liver ROS, GERD  ,  Endo/Other  negative endocrine ROS  Renal/GU negative Renal ROS  negative genitourinary   Musculoskeletal negative musculoskeletal ROS (+)   Abdominal   Peds negative pediatric ROS (+)  Hematology  (+) anemia ,   Anesthesia Other Findings   Reproductive/Obstetrics negative OB ROS                             Anesthesia Physical Anesthesia Plan  ASA: III  Anesthesia Plan: General   Post-op Pain Management:    Induction: Intravenous  PONV Risk Score and Plan: 3 and Ondansetron, Dexamethasone and Treatment may vary due to age or medical condition  Airway Management Planned: LMA  Additional Equipment:   Intra-op Plan:   Post-operative Plan: Extubation in OR  Informed Consent: I have reviewed the patients History and Physical, chart, labs and discussed the procedure including the risks, benefits and alternatives for the proposed anesthesia with the patient or authorized representative who has indicated his/her understanding and acceptance.     Dental advisory given  Plan Discussed with: CRNA and Surgeon  Anesthesia Plan Comments:         Anesthesia Quick Evaluation

## 2020-06-21 NOTE — Patient Instructions (Signed)
IMMEDIATELY FOLLOWING SURGERY: Do not drive or operate machinery for the first twenty four hours after surgery. Do not make any important decisions for twenty four hours after surgery or while taking narcotic pain medications or sedatives. If you develop intractable nausea and vomiting or a severe headache please notify your doctor immediately.   FOLLOW-UP: You do not need to follow up with anesthesia unless specifically instructed to do so.   WOUND CARE INSTRUCTIONS (if applicable): Expect some mild vaginal bleeding, but if large amount of bleeding occurs please contact Dr. Roselind Messier at [phone] or the Radiation On-Call physician. Call for any fever greater than 101.0 degrees or increasing vaginal//abdominal pain or trouble urinating.   QUESTIONS?: Please feel free to call your physician or the hospital operator if you have any questions, and they will be happy to assist you. Resume all medications: as listed on your after visit summary. Your next appointment is:  Future Appointments  Date Time Provider Department Center  06/24/2020 11:15 AM [facility] None  06/24/2020 11:40 AM Artis Delay, MD [facility] None  06/29/2020  7:30 AM [facility] 2 [facility] [place]  06/29/2020 10:00 AM Antony Blackbird, MD [facility] None  06/29/2020  1:00 PM Antony Blackbird, MD [facility] None  07/06/2020 10:00 AM [facility] 1 [facility] [place]  07/06/2020  1:00 PM Antony Blackbird, MD [facility] None  07/06/2020  3:00 PM Antony Blackbird, MD [facility] None  07/14/2020  7:30 AM [facility] 2 [facility]US [place]  07/14/2020 10:00 AM Antony Blackbird, MD [facility] None  07/14/2020  1:30 PM Antony Blackbird, MD [facility] None  07/19/2020  7:00 AM [facility]US 1 [facility]US [place]  07/19/2020 10:00 AM Antony Blackbird, MD [facility] None  07/19/2020  1:00 PM Antony Blackbird, MD [facility] None

## 2020-06-21 NOTE — Anesthesia Procedure Notes (Signed)
Procedure Name: LMA Insertion Date/Time: 06/21/2020 9:11 AM Performed by: Norva Pavlov, CRNA Pre-anesthesia Checklist: Patient identified, Emergency Drugs available, Suction available and Patient being monitored Patient Re-evaluated:Patient Re-evaluated prior to induction Oxygen Delivery Method: Circle system utilized Preoxygenation: Pre-oxygenation with 100% oxygen Induction Type: IV induction Ventilation: Mask ventilation without difficulty LMA: LMA inserted LMA Size: 4.0 Number of attempts: 1 Airway Equipment and Method: Bite block Placement Confirmation: positive ETCO2 Tube secured with: Tape Dental Injury: Teeth and Oropharynx as per pre-operative assessment

## 2020-06-21 NOTE — Op Note (Signed)
06/21/2020  10:30 AM  PATIENT:  Phyllis Hebert  65 y.o. female  PRE-OPERATIVE DIAGNOSIS:  CERVICAL CANCER  POST-OPERATIVE DIAGNOSIS:  CERVICAL CANCER  PROCEDURE:  Procedure(s) with comments: TANDEM RING INSERTION (N/A) OPERATIVE ULTRASOUND (N/A) - TECH  SURGEON:  Surgeon(s) and Role:    * Antony Blackbird, MD - Primary  PHYSICIAN ASSISTANT:   ASSISTANTS: none   ANESTHESIA:   general  EBL:  10 mL   BLOOD ADMINISTERED:none  DRAINS: Urinary Catheter (Foley)   LOCAL MEDICATIONS USED:  NONE  SPECIMEN:  No Specimen  DISPOSITION OF SPECIMEN:  N/A  COUNTS:  YES  TOURNIQUET:  * No tourniquets in log *  DICTATION: Patient was taken to outpatient OR #5.  Timeout was performed for the estimated length of procedure, procedure, preoperative antibiotics, allergies.  The patient was originally scheduled for OR #1 due to anesthesia issues the operating room number was changed.  Patient was prepped and draped in the usual sterile fashion and placed in the dorsolithotomy position.  A Foley catheter was placed without difficulty.  Clear yellowish urine was obtained.  Patient then underwent exam under anesthesia,based on intraoperative exam and ultrasound findings the cervical mass was approximately 3.5 x 2.6 cm, a significant decrease in size compared to pretreatment.  The cervical mass however continue to be quite firm with firm induration throughout the cervix and parametrial area more so along the right side.  The cervical os was flush with the upper vaginal area and firm nodularity was palpated along the region of the cervix.  Patient proceeded to undergo sounding of the uterus.  Due to firmness in the upper to midportion of the uterus the sound could not reach the fundus but was within approximately 2 cm of the fundus.  The uterus sounded from this portion towards the cervix approximately 6 cm.  Patient then had serial dilation of the cervical os.  Initial ultrasound imaging after instillation  of approximately 250 cc of sterile water in the bladder were excellent however once patient had dilation of the cervical os air passed into the endometrial cavity limiting the ultrasound images but still adequate.  After appropriate dilation the patient then had placement of a 60 mm cervical sleeve, 60 mm, 45 degree tandem within the endometrial cavity and cervical region.  Attached to this device was a 45 degree cervical ring with a small shielding In place.  The patient then had placement of a rectal paddle posteriorly to limit dose to the rectal mucosa.  Patient tolerated the procedure well was transferred for to the recovery room in stable condition.  Later in the day the patient will be transferred to radiation oncology for simulation planning and her first high-dose-rate treatment.  Patient is to receive a total of 5 high-dose-rate treatments with estimated dose per treatment to the high risk clinical target volume of 5.5 Gy.  Iridium 192 will be the high-dose-rate source.  PLAN OF CARE: Transferred to radiation oncology for planning and treatment  PATIENT DISPOSITION:  PACU - hemodynamically stable.   Delay start of Pharmacological VTE agent (>24hrs) due to surgical blood loss or risk of bleeding: not applicable

## 2020-06-21 NOTE — Telephone Encounter (Signed)
LM on VM that results are back and to please call.  Left CB instructions. 

## 2020-06-22 ENCOUNTER — Ambulatory Visit: Payer: Self-pay | Admitting: Genetic Counselor

## 2020-06-22 ENCOUNTER — Encounter (HOSPITAL_BASED_OUTPATIENT_CLINIC_OR_DEPARTMENT_OTHER): Payer: Self-pay | Admitting: Radiation Oncology

## 2020-06-22 DIAGNOSIS — C539 Malignant neoplasm of cervix uteri, unspecified: Secondary | ICD-10-CM

## 2020-06-22 DIAGNOSIS — Z1379 Encounter for other screening for genetic and chromosomal anomalies: Secondary | ICD-10-CM

## 2020-06-22 NOTE — Telephone Encounter (Signed)
Revealed negative genetic testing.  Discussed that we do not know why she has cervical cancer or why there is cancer in the family. It could be due to a different gene that we are not testing, or maybe our current technology may not be able to pick something up.  It will be important for her to keep in contact with genetics to keep up with whether additional testing may be needed.

## 2020-06-22 NOTE — Progress Notes (Signed)
HPI:  [name] was previously seen in the [facility] clinic due to a personal and family history of cancer and concerns regarding a hereditary predisposition to cancer. Please refer to our prior cancer genetics clinic note for more information regarding our discussion, assessment and recommendations, at the time. [name]'s recent genetic test results were disclosed to her, as were recommendations warranted by these results. These results and recommendations are discussed in more detail below.  CANCER HISTORY:  Oncology History  Cervical cancer, FIGO stage IVB (HCC)  03/16/2020 Initial Diagnosis   The patient had history of new onset vaginal spotting on March 16, 2020.  She immediately called her OB/GYN's office and achieved an appointment with her OB/GYN's partner, Dr. Almquist, who saw and evaluated the patient on March 19, 2020.  At that time a transvaginal ultrasound scan was performed which revealed a uterus measuring 5.3 x 8.4 x 4.6 cm with an ill-defined endometrium.  There is a complex cyst on the left ovary measuring 2.6 cm with peripheral blood flow noted.  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  Immunostains were positive for CEA and CDX2, negative for p16, ER, vimentin, and PAX8.  The differential diagnosis included primary gastric type endocervical adenocarcinoma (though this typically stains positive for PAX8) or a metastasis from pancreaticobiliary or upper GI primary.   03/29/2020 Pathology Results   1. Surgical [P], duodenum - PEPTIC DUODENITIS. - NO DYSPLASIA OR MALIGNANCY. 2. Surgical [P], gastric antrum and gastric body - REACTIVE GASTROPATHY. - WARTHIN-STARRY IS NEGATIVE FOR HELICOBACTER PYLORI. - NO INTESTINAL METAPLASIA, DYSPLASIA, OR MALIGNANCY. 3. Surgical [P], gastric nodule - MILD REACTIVE GASTROPATHY. - NO INTESTINAL METAPLASIA,  DYSPLASIA, OR MALIGNANCY. 4. Surgical [P], stomach, lesser curve ulcers - REACTIVE GASTROPATHY WITH EROSIONS. - NO INTESTINAL METAPLASIA, DYSPLASIA, OR MALIGNANCY. 5. Surgical [P], gastric polyps - HYPERPLASTIC POLYP. - NO INTESTINAL METAPLASIA, DYSPLASIA OR MALIGNANCY. 6. Surgical [P], colon, descending, polyp - HYPERPLASTIC POLYP. - NO DYSPLASIA OR MALIGNANCY.   04/06/2020 Procedure   EGD report  Normal esophagus. - A few gastric polyps. Suspected fundic gland polyps. - Non-bleeding gastric ulcers with pigmented material. Biopsied. - A single gastric polyp/nodule. Resected and retrieved. - Erythematous duodenopathy. Biopsied. - The examination was otherwise normal.   04/06/2020 Procedure   Colonoscopy report - Non-bleeding internal hemorrhoids. - One 4 mm polyp in the descending colon, removed with a cold snare. Resected and retrieved. - The examination was otherwise normal on direct and retroflexion views   04/19/2020 PET scan   1. Hypermetabolic uterine cervix mass compatible with known primary cervical malignancy, with hypermetabolism extending into the upper third of the vagina and extending throughout the uterine body. No frank extrauterine extension. 2. Mild to moderate left hydroureteronephrosis to the level of the left UVJ, concerning for tumor involvement of the left UVJ. 3. Hypermetabolic right common iliac nodal metastasis. 4. Hypermetabolic sclerotic right ischial bone metastasis. 5. Two indeterminate small scattered solid pulmonary nodules, below PET resolution, warranting attention on chest CT follow-up in 3 months. 6.  Aortic Atherosclerosis (ICD10-I70.0).   04/19/2020 Cancer Staging   Staging form: Cervix Uteri, AJCC Version 9 - Clinical stage from 04/19/2020: FIGO Stage IVB (cT3b, cN1a, cM1) - Signed by Phyllis Hebert, Ni, MD on 04/19/2020   04/26/2020 Procedure   Successful placement of a right IJ approach Power Port with ultrasound and fluoroscopic guidance. The  catheter is ready for use     04/30/2020 -  Chemotherapy   The patient had cisplatin for chemotherapy treatment.     06/20/2020 Genetic Testing   Negative genetic testing on the CancerNext-Expanded+RNAinsight panel.  The CancerNext-Expanded gene panel offered by [facility] and includes sequencing and rearrangement analysis for the following 77 genes: AIP, ALK, APC*, ATM*, AXIN2, BAP1, BARD1, BLM, BMPR1A, BRCA1*, BRCA2*, BRIP1*, CDC73, CDH1*, CDK4, CDKN1B, CDKN2A, CHEK2*, CTNNA1, DICER1, FANCC, FH, FLCN, GALNT12, KIF1B, LZTR1, MAX, MEN1, MET, MLH1*, MSH2*, MSH3, MSH6*, MUTYH*, NBN, NF1*, NF2, NTHL1, PALB2*, PHOX2B, PMS2*, POT1, PRKAR1A, PTCH1, PTEN*, RAD51C*, RAD51D*, RB1, RECQL, RET, SDHA, SDHAF2, SDHB, SDHC, SDHD, SMAD4, SMARCA4, SMARCB1, SMARCE1, STK11, SUFU, TMEM127, TP53*, TSC1, TSC2, VHL and XRCC2 (sequencing and deletion/duplication); EGFR, EGLN1, HOXB13, KIT, MITF, PDGFRA, POLD1, and POLE (sequencing only); EPCAM and GREM1 (deletion/duplication only). DNA and RNA analyses performed for * genes. The report date is June 20, 2020.   Metastasis to bone (Phyllis Hebert)  04/19/2020 Initial Diagnosis   Metastasis to bone [facility])     FAMILY HISTORY:  We obtained a detailed, 4-generation family history.  Significant diagnoses are listed below: Family History  Problem Relation Age of Onset  . Hypertension Mother   . Emphysema Mother   . Uterine cancer Mother 73  . Hypertension Father   . Stroke Father   . Hypertension Brother   . Healthy Daughter   . Breast cancer Maternal Aunt        d. 71  . Leukemia Cousin        d. 69s    The patient has two daughters who are cancer free. She has a brother who is cancer free.  Both parents are deceased.  The patient's father died of lung cancer.  He has one sister who is living.  She has a daughter who had leukemia and died in her 19's.  Both paternal grandparents are deceased.  The patient's mother had uterine cancer at age 70, and died at 34.  She had  four brothers and a sister.  Her sister had breast cancer over age 52.  Both maternal grandparents are deceased.  [name] is unaware of previous family history of genetic testing for hereditary cancer risks. Patient's maternal ancestors are of [place] descent, and paternal ancestors are of [place] descent. There is no reported Ashkenazi Jewish ancestry. There is no known consanguinity.     GENETIC TEST RESULTS: Genetic testing reported out on June 20, 2020 through the CancerNext-Expanded+RNAinsight cancer panel found no pathogenic mutations. The CancerNext-Expanded gene panel offered by [facility] and includes sequencing and rearrangement analysis for the following 77 genes: AIP, ALK, APC*, ATM*, AXIN2, BAP1, BARD1, BLM, BMPR1A, BRCA1*, BRCA2*, BRIP1*, CDC73, CDH1*, CDK4, CDKN1B, CDKN2A, CHEK2*, CTNNA1, DICER1, FANCC, FH, FLCN, GALNT12, KIF1B, LZTR1, MAX, MEN1, MET, MLH1*, MSH2*, MSH3, MSH6*, MUTYH*, NBN, NF1*, NF2, NTHL1, PALB2*, PHOX2B, PMS2*, POT1, PRKAR1A, PTCH1, PTEN*, RAD51C*, RAD51D*, RB1, RECQL, RET, SDHA, SDHAF2, SDHB, SDHC, SDHD, SMAD4, SMARCA4, SMARCB1, SMARCE1, STK11, SUFU, TMEM127, TP53*, TSC1, TSC2, VHL and XRCC2 (sequencing and deletion/duplication); EGFR, EGLN1, HOXB13, KIT, MITF, PDGFRA, POLD1, and POLE (sequencing only); EPCAM and GREM1 (deletion/duplication only). DNA and RNA analyses performed for * genes. The test report has been scanned into EPIC and is located under the Molecular Pathology section of the Results Review tab.  A portion of the result report is included below for reference.     We discussed with [name] that because current genetic testing is not perfect, it is possible there may be a gene mutation in one of these  genes that current testing cannot detect, but that chance is small.  We also discussed, that there could be another gene that has not yet been discovered, or that we have not yet tested, that is responsible for the cancer diagnoses in the  family. It is also possible there is a hereditary cause for the cancer in the family that [name] did not inherit and therefore was not identified in her testing.  Therefore, it is important to remain in touch with cancer genetics in the future so that we can continue to offer [name] the most up to date genetic testing.   ADDITIONAL GENETIC TESTING: We discussed with [name] that her genetic testing was fairly extensive.  If there are genes identified to increase cancer risk that can be analyzed in the future, we would be happy to discuss and coordinate this testing at that time.    CANCER SCREENING RECOMMENDATIONS: [name] test result is considered negative (normal).  This means that we have not identified a hereditary cause for her personal and family history of cancer at this time. Most cancers happen by chance and this negative test suggests that her cancer may fall into this category.    While reassuring, this does not definitively rule out a hereditary predisposition to cancer. It is still possible that there could be genetic mutations that are undetectable by current technology. There could be genetic mutations in genes that have not been tested or identified to increase cancer risk.  Therefore, it is recommended she continue to follow the cancer management and screening guidelines provided by her oncology and primary healthcare provider.   An individual's cancer risk and medical management are not determined by genetic test results alone. Overall cancer risk assessment incorporates additional factors, including personal medical history, family history, and any available genetic information that may result in a personalized plan for cancer prevention and surveillance  RECOMMENDATIONS FOR FAMILY MEMBERS:  Individuals in this family might be at some increased risk of developing cancer, over the general population risk, simply due to the family history of cancer.  We recommended women in  this family have a yearly mammogram beginning at age 84, or 58 years younger than the earliest onset of cancer, an annual clinical breast exam, and perform monthly breast self-exams. Women in this family should also have a gynecological exam as recommended by their primary provider. All family members should be referred for colonoscopy starting at age 84.  FOLLOW-UP: Lastly, we discussed with [name] that cancer genetics is a rapidly advancing field and it is possible that new genetic tests will be appropriate for her and/or her family members in the future. We encouraged her to remain in contact with cancer genetics on an annual basis so we can update her personal and family histories and let her know of advances in cancer genetics that may benefit this family.   Our contact number was provided. [name] questions were answered to her satisfaction, and she knows she is welcome to call us at anytime with additional questions or concerns.   Roma Kayser, [place], [facility] Licensed, Certified Genetic Counselor [email or web address]

## 2020-06-24 ENCOUNTER — Telehealth: Payer: Self-pay | Admitting: Hematology and Oncology

## 2020-06-24 ENCOUNTER — Inpatient Hospital Stay: Payer: Managed Care, Other (non HMO) | Attending: Gynecologic Oncology | Admitting: Hematology and Oncology

## 2020-06-24 ENCOUNTER — Other Ambulatory Visit: Payer: Self-pay

## 2020-06-24 ENCOUNTER — Inpatient Hospital Stay: Payer: Managed Care, Other (non HMO)

## 2020-06-24 ENCOUNTER — Encounter: Payer: Self-pay | Admitting: Hematology and Oncology

## 2020-06-24 VITALS — BP 150/76 | HR 70 | Temp 98.3°F | Resp 18 | Ht 64.5 in | Wt 211.0 lb

## 2020-06-24 DIAGNOSIS — Z79899 Other long term (current) drug therapy: Secondary | ICD-10-CM | POA: Diagnosis not present

## 2020-06-24 DIAGNOSIS — D61818 Other pancytopenia: Secondary | ICD-10-CM

## 2020-06-24 DIAGNOSIS — N1330 Unspecified hydronephrosis: Secondary | ICD-10-CM | POA: Diagnosis not present

## 2020-06-24 DIAGNOSIS — C539 Malignant neoplasm of cervix uteri, unspecified: Secondary | ICD-10-CM

## 2020-06-24 DIAGNOSIS — M329 Systemic lupus erythematosus, unspecified: Secondary | ICD-10-CM | POA: Diagnosis not present

## 2020-06-24 DIAGNOSIS — Z923 Personal history of irradiation: Secondary | ICD-10-CM | POA: Insufficient documentation

## 2020-06-24 DIAGNOSIS — C7951 Secondary malignant neoplasm of bone: Secondary | ICD-10-CM | POA: Diagnosis not present

## 2020-06-24 DIAGNOSIS — R030 Elevated blood-pressure reading, without diagnosis of hypertension: Secondary | ICD-10-CM

## 2020-06-24 DIAGNOSIS — R110 Nausea: Secondary | ICD-10-CM | POA: Diagnosis not present

## 2020-06-24 LAB — COMPREHENSIVE METABOLIC PANEL
ALT: 13 U/L (ref 0–44)
AST: 8 U/L — ABNORMAL LOW (ref 15–41)
Albumin: 3.5 g/dL (ref 3.5–5.0)
Alkaline Phosphatase: 61 U/L (ref 38–126)
Anion gap: 10 (ref 5–15)
BUN: 24 mg/dL — ABNORMAL HIGH (ref 8–23)
CO2: 25 mmol/L (ref 22–32)
Calcium: 9.8 mg/dL (ref 8.9–10.3)
Chloride: 105 mmol/L (ref 98–111)
Creatinine, Ser: 0.99 mg/dL (ref 0.44–1.00)
GFR, Estimated: >60 mL/min (ref >60)
Glucose, Bld: 106 mg/dL — ABNORMAL HIGH (ref 70–99)
Potassium: 3.4 mmol/L — ABNORMAL LOW (ref 3.5–5.1)
Sodium: 140 mmol/L (ref 135–145)
Total Bilirubin: 0.4 mg/dL (ref 0.3–1.2)
Total Protein: 6.8 g/dL (ref 6.5–8.1)

## 2020-06-24 LAB — CBC WITH DIFFERENTIAL/PLATELET
Abs Immature Granulocytes: 0.09 10*3/uL — ABNORMAL HIGH (ref 0.00–0.07)
Basophils Absolute: 0 10*3/uL (ref 0.0–0.1)
Basophils Relative: 0 %
Eosinophils Absolute: 0 10*3/uL (ref 0.0–0.5)
Eosinophils Relative: 0 %
HCT: 29.9 % — ABNORMAL LOW (ref 36.0–46.0)
Hemoglobin: 10 g/dL — ABNORMAL LOW (ref 12.0–15.0)
Immature Granulocytes: 2 %
Lymphocytes Relative: 13 %
Lymphs Abs: 0.7 10*3/uL (ref 0.7–4.0)
MCH: 30 pg (ref 26.0–34.0)
MCHC: 33.4 g/dL (ref 30.0–36.0)
MCV: 89.8 fL (ref 80.0–100.0)
Monocytes Absolute: 0.7 10*3/uL (ref 0.1–1.0)
Monocytes Relative: 14 %
Neutro Abs: 3.8 10*3/uL (ref 1.7–7.7)
Neutrophils Relative %: 71 %
Platelets: 178 10*3/uL (ref 150–400)
RBC: 3.33 MIL/uL — ABNORMAL LOW (ref 3.87–5.11)
RDW: 20 % — ABNORMAL HIGH (ref 11.5–15.5)
WBC: 5.4 10*3/uL (ref 4.0–10.5)
nRBC: 0.6 % — ABNORMAL HIGH (ref 0.0–0.2)

## 2020-06-24 LAB — MAGNESIUM: Magnesium: 1.9 mg/dL (ref 1.7–2.4)

## 2020-06-24 NOTE — Telephone Encounter (Signed)
Scheduled appts per 1/6 sch msg. Pt stated she would refer to mychart for appts and AVS.

## 2020-06-24 NOTE — Assessment & Plan Note (Signed)
Her blood counts are improving She will continue radiation therapy as scheduled Plan to see her again at the end of the month to discuss treatment options and to switch her to a different chemotherapy in the future We will also initiate dexamethasone taper

## 2020-06-24 NOTE — Assessment & Plan Note (Signed)
Anemia is improving Thrombocytopenia has resolved Observe closely for now

## 2020-06-24 NOTE — Progress Notes (Signed)
[place] OFFICE PROGRESS NOTE  Patient Care Team: [name], MD as PCP - General (Family Medicine) [name], MD as Consulting Physician (Obstetrics and Gynecology) [name], RN as Oncology Nurse Navigator (Oncology)  ASSESSMENT & PLAN:  Cervical cancer, FIGO stage IVB [facility]) Her blood counts are improving She will continue radiation therapy as scheduled Plan to see her again at the end of the month to discuss treatment options and to switch her to a different chemotherapy in the future We will also initiate dexamethasone taper  Pancytopenia, acquired ([place]) Anemia is improving Thrombocytopenia has resolved Observe closely for now  Nausea without vomiting She has no further nausea or vomiting We will initiate dexamethasone taper  Elevated BP without diagnosis of hypertension Monitor closely for now I plan to add bevacizumab in the future   Orders Placed This Encounter  Procedures  . CBC with Differential (Cancer Center Only)    Standing Status:   Standing    Number of Occurrences:   20    Standing Expiration Date:   06/24/2021  . CMP ([place] only)    Standing Status:   Standing    Number of Occurrences:   20    Standing Expiration Date:   06/24/2021  . Total Protein, Urine dipstick    Standing Status:   Standing    Number of Occurrences:   20    Standing Expiration Date:   06/24/2021    All questions were answered. The patient knows to call the clinic with any problems, questions or concerns. The total time spent in the appointment was 20 minutes encounter with patients including review of chart and various tests results, discussions about plan of care and coordination of care plan   [name], MD 06/24/2020 12:49 PM  INTERVAL HISTORY: Please see below for problem oriented charting. She returns for further follow-up She felt better Energy level and appetite has improved She denies nausea No recent constipation No new bone  pain SUMMARY OF ONCOLOGIC HISTORY: Oncology History  Cervical cancer, FIGO stage IVB ([place])  03/16/2020 Initial Diagnosis   The patient had history of new onset vaginal spotting on March 16, 2020.  She immediately called her OB/GYN's office and achieved an appointment with her OB/GYN's partner, Dr. Murrell Redden, who saw and evaluated the patient on March 19, 2020.  At that time a transvaginal ultrasound scan was performed which revealed a uterus measuring 5.3 x 8.4 x 4.6 cm with an ill-defined endometrium.  There is a complex cyst on the left ovary measuring 2.6 cm with peripheral blood flow noted.  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  Immunostains were positive for CEA and CDX2, negative for p16, ER, vimentin, and PAX8.  The differential diagnosis included primary gastric type endocervical adenocarcinoma (though this typically stains positive for PAX8) or a metastasis from pancreaticobiliary or upper GI primary.   03/29/2020 Pathology Results   1. Surgical [P], duodenum - PEPTIC DUODENITIS. - NO DYSPLASIA OR MALIGNANCY. 2. Surgical [P], gastric antrum and gastric body - REACTIVE GASTROPATHY. [place] IS NEGATIVE FOR HELICOBACTER PYLORI. - NO INTESTINAL METAPLASIA, DYSPLASIA, OR MALIGNANCY. 3. Surgical [P], gastric nodule - MILD REACTIVE GASTROPATHY. - NO INTESTINAL METAPLASIA, DYSPLASIA, OR MALIGNANCY. 4. Surgical [P], stomach, lesser curve ulcers - REACTIVE GASTROPATHY WITH EROSIONS. - NO INTESTINAL METAPLASIA, DYSPLASIA, OR MALIGNANCY. 5. Surgical [P], gastric polyps - HYPERPLASTIC POLYP. - NO INTESTINAL METAPLASIA,  DYSPLASIA OR MALIGNANCY. 6. Surgical [P], colon, descending, polyp - HYPERPLASTIC POLYP. - NO DYSPLASIA OR MALIGNANCY.   04/06/2020 Procedure   EGD report  Normal esophagus. - A few gastric polyps. Suspected fundic gland polyps. -  Non-bleeding gastric ulcers with pigmented material. Biopsied. - A single gastric polyp/nodule. Resected and retrieved. - Erythematous duodenopathy. Biopsied. - The examination was otherwise normal.   04/06/2020 Procedure   Colonoscopy report - Non-bleeding internal hemorrhoids. - One 4 mm polyp in the descending colon, removed with a cold snare. Resected and retrieved. - The examination was otherwise normal on direct and retroflexion views   04/19/2020 PET scan   1. Hypermetabolic uterine cervix mass compatible with known primary cervical malignancy, with hypermetabolism extending into the upper third of the vagina and extending throughout the uterine body. No frank extrauterine extension. 2. Mild to moderate left hydroureteronephrosis to the level of the left UVJ, concerning for tumor involvement of the left UVJ. 3. Hypermetabolic right common iliac nodal metastasis. 4. Hypermetabolic sclerotic right ischial bone metastasis. 5. Two indeterminate small scattered solid pulmonary nodules, below PET resolution, warranting attention on chest CT follow-up in 3 months. 6.  Aortic Atherosclerosis (ICD10-I70.0).   04/19/2020 Cancer Staging   Staging form: Cervix Uteri, AJCC Version 9 - Clinical stage from 04/19/2020: FIGO Stage IVB (cT3b, cN1a, cM1) - Signed by [name], MD on 04/19/2020   04/26/2020 Procedure   Successful placement of a right IJ approach Power Port with ultrasound and fluoroscopic guidance. The catheter is ready for use   04/30/2020 -  Chemotherapy   The patient had cisplatin for chemotherapy treatment.     06/20/2020 Genetic Testing   Negative genetic testing on the CancerNext-Expanded+RNAinsight panel.  The CancerNext-Expanded gene panel offered by [facility] and includes sequencing and rearrangement analysis for the following 77 genes: AIP, ALK, APC*, ATM*, AXIN2, BAP1, BARD1, BLM, BMPR1A, BRCA1*, BRCA2*, BRIP1*, CDC73, CDH1*, CDK4, CDKN1B, CDKN2A, CHEK2*, CTNNA1, DICER1,  FANCC, FH, FLCN, GALNT12, KIF1B, LZTR1, MAX, MEN1, MET, MLH1*, MSH2*, MSH3, MSH6*, MUTYH*, NBN, NF1*, NF2, NTHL1, PALB2*, PHOX2B, PMS2*, POT1, PRKAR1A, PTCH1, PTEN*, RAD51C*, RAD51D*, RB1, RECQL, RET, SDHA, SDHAF2, SDHB, SDHC, SDHD, SMAD4, SMARCA4, SMARCB1, SMARCE1, STK11, SUFU, TMEM127, TP53*, TSC1, TSC2, VHL and XRCC2 (sequencing and deletion/duplication); EGFR, EGLN1, HOXB13, KIT, MITF, PDGFRA, POLD1, and POLE (sequencing only); EPCAM and GREM1 (deletion/duplication only). DNA and RNA analyses performed for * genes. The report date is June 20, 2020.   07/23/2020 -  Chemotherapy   The patient had palonosetron (ALOXI) injection 0.25 mg, 0.25 mg, Intravenous,  Once, 0 of 8 cycles CARBOplatin (PARAPLATIN) 570 mg in sodium chloride 0.9 % 250 mL chemo infusion, 570 mg (100 % of original dose 570 mg), Intravenous,  Once, 0 of 8 cycles Dose modification:   (original dose 570 mg, Cycle 1) fosaprepitant (EMEND) 150 mg in sodium chloride 0.9 % 145 mL IVPB, 150 mg, Intravenous,  Once, 0 of 8 cycles PACLitaxel (TAXOL) 366 mg in sodium chloride 0.9 % 500 mL chemo infusion (> $RemoveBef'80mg'HuXbAhetpc$ /m2), 175 mg/m2 = 366 mg, Intravenous,  Once, 0 of 8 cycles bevacizumab-bvzr (ZIRABEV) 1,400 mg in sodium chloride 0.9 % 100 mL chemo infusion, 15 mg/kg = 1,400 mg, Intravenous,  Once, 0 of 8 cycles  for chemotherapy treatment.    Metastasis to bone ([place])  04/19/2020 Initial Diagnosis   Metastasis to bone ([place])   07/23/2020 -  Chemotherapy   The patient had palonosetron (ALOXI) injection 0.25 mg, 0.25 mg, Intravenous,  Once, 0 of 8 cycles CARBOplatin (PARAPLATIN)  570 mg in sodium chloride 0.9 % 250 mL chemo infusion, 570 mg (100 % of original dose 570 mg), Intravenous,  Once, 0 of 8 cycles Dose modification:   (original dose 570 mg, Cycle 1) fosaprepitant (EMEND) 150 mg in sodium chloride 0.9 % 145 mL IVPB, 150 mg, Intravenous,  Once, 0 of 8 cycles PACLitaxel (TAXOL) 366 mg in sodium chloride 0.9 % 500 mL chemo infusion (> 49m/m2),  175 mg/m2 = 366 mg, Intravenous,  Once, 0 of 8 cycles bevacizumab-bvzr (ZIRABEV) 1,400 mg in sodium chloride 0.9 % 100 mL chemo infusion, 15 mg/kg = 1,400 mg, Intravenous,  Once, 0 of 8 cycles  for chemotherapy treatment.      REVIEW OF SYSTEMS:   Constitutional: Denies fevers, chills or abnormal weight loss Eyes: Denies blurriness of vision Ears, nose, mouth, throat, and face: Denies mucositis or sore throat Respiratory: Denies cough, dyspnea or wheezes Cardiovascular: Denies palpitation, chest discomfort or lower extremity swelling Gastrointestinal:  Denies nausea, heartburn or change in bowel habits Skin: Denies abnormal skin rashes Lymphatics: Denies new lymphadenopathy or easy bruising Neurological:Denies numbness, tingling or new weaknesses Behavioral/Psych: Mood is stable, no new changes  All other systems were reviewed with the patient and are negative.  I have reviewed the past medical history, past surgical history, social history and family history with the patient and they are unchanged from previous note.  ALLERGIES:  is allergic to penicillins.  MEDICATIONS:  Current Outpatient Medications  Medication Sig Dispense Refill  . Cholecalciferol (VITAMIN D3) 2000 units TABS Take 2 tablets by mouth daily.    . clobetasol cream (TEMOVATE) 0.05 % Apply topically as needed. Left leg    . dexamethasone (DECADRON) 4 MG tablet Take 1 tablet (4 mg total) by mouth daily. 30 tablet 1  . fenofibrate 160 MG tablet TAKE 1 TABLET BY MOUTH EVERY DAY (Patient taking differently: Take 160 mg by mouth daily with lunch.) 30 tablet 5  . hydroxychloroquine (PLAQUENIL) 200 MG tablet 2 TABLET WITH FOOD OR MILK ONCE A DAY ORALLY 30 (Patient not taking: No sig reported)  2  . lidocaine-prilocaine (EMLA) cream Apply to affected area once 30 g 3  . LORazepam (ATIVAN) 0.5 MG tablet Take 1 tablet (0.5 mg total) by mouth every 8 (eight) hours as needed for anxiety. 30 tablet 0  . ondansetron (ZOFRAN) 8 MG  tablet Take 1 tablet (8 mg total) by mouth every 8 (eight) hours as needed. Start on the third day after cisplatin chemotherapy. 30 tablet 1  . pantoprazole (PROTONIX) 40 MG tablet Take 1 tablet (40 mg total) by mouth 2 (two) times daily. 90 tablet 3  . prochlorperazine (COMPAZINE) 10 MG tablet Take 1 tablet (10 mg total) by mouth every 6 (six) hours as needed (Nausea or vomiting). 30 tablet 1  . traMADol (ULTRAM) 50 MG tablet Take 2 tablets (100 mg total) by mouth every 6 (six) hours as needed. 60 tablet 0   No current facility-administered medications for this visit.    PHYSICAL EXAMINATION: ECOG PERFORMANCE STATUS: 1 - Symptomatic but completely ambulatory  Vitals:   06/24/20 1203  BP: (!) 150/76  Pulse: 70  Resp: 18  Temp: 98.3 F (36.8 C)  SpO2: 100%   Filed Weights   06/24/20 1203  Weight: 211 lb (95.7 kg)    GENERAL:alert, no distress and comfortable NEURO: alert & oriented x 3 with fluent speech, no focal motor/sensory deficits  LABORATORY DATA:  I have reviewed the data as listed  Component Value Date/Time   NA 140 06/24/2020 1115   NA 140 06/21/2016 1530   K 3.4 (L) 06/24/2020 1115   CL 105 06/24/2020 1115   CO2 25 06/24/2020 1115   GLUCOSE 106 (H) 06/24/2020 1115   BUN 24 (H) 06/24/2020 1115   BUN 16 06/21/2016 1530   CREATININE 0.99 06/24/2020 1115   CREATININE 0.97 06/16/2020 1318   CREATININE 0.87 07/02/2017 1549   CALCIUM 9.8 06/24/2020 1115   PROT 6.8 06/24/2020 1115   PROT 7.3 06/21/2016 1530   ALBUMIN 3.5 06/24/2020 1115   ALBUMIN 4.3 06/21/2016 1530   AST 8 (L) 06/24/2020 1115   ALT 13 06/24/2020 1115   ALKPHOS 61 06/24/2020 1115   BILITOT 0.4 06/24/2020 1115   BILITOT 0.3 06/21/2016 1530   GFRNONAA >60 06/24/2020 1115   GFRNONAA >60 06/16/2020 1318   GFRAA 111 06/21/2016 1530    No results found for: SPEP, UPEP  Lab Results  Component Value Date   WBC 5.4 06/24/2020   NEUTROABS 3.8 06/24/2020   HGB 10.0 (L) 06/24/2020   HCT 29.9  (L) 06/24/2020   MCV 89.8 06/24/2020   PLT 178 06/24/2020      Chemistry      Component Value Date/Time   NA 140 06/24/2020 1115   NA 140 06/21/2016 1530   K 3.4 (L) 06/24/2020 1115   CL 105 06/24/2020 1115   CO2 25 06/24/2020 1115   BUN 24 (H) 06/24/2020 1115   BUN 16 06/21/2016 1530   CREATININE 0.99 06/24/2020 1115   CREATININE 0.97 06/16/2020 1318   CREATININE 0.87 07/02/2017 1549      Component Value Date/Time   CALCIUM 9.8 06/24/2020 1115   ALKPHOS 61 06/24/2020 1115   AST 8 (L) 06/24/2020 1115   ALT 13 06/24/2020 1115   BILITOT 0.4 06/24/2020 1115   BILITOT 0.3 06/21/2016 1530

## 2020-06-24 NOTE — Assessment & Plan Note (Signed)
She has no further nausea or vomiting We will initiate dexamethasone taper

## 2020-06-24 NOTE — Assessment & Plan Note (Signed)
Monitor closely for now I plan to add bevacizumab in the future

## 2020-06-24 NOTE — Progress Notes (Signed)
DISCONTINUE OFF PATHWAY REGIMEN - Other   [id number]:Cisplatin 35 mg/m2 q7 Days + RT:   A cycle is every 7 days:     Cisplatin   **Always confirm dose/schedule in your pharmacy ordering system**  REASON: Other Reason PRIOR TREATMENT: Cisplatin 35 mg/m2 q7 Days + RT TREATMENT RESPONSE: Partial Response (PR)  START OFF PATHWAY REGIMEN - Other   [id number]:Bevacizumab 15 mg/kg IV D1 + Carboplatin AUC=5 IV D1 + Paclitaxel 175 mg/m2 IV D1 q21 Days:   A cycle is every 21 days:     Bevacizumab-xxxx      Paclitaxel      Carboplatin   **Always confirm dose/schedule in your pharmacy ordering system**  Patient Characteristics: Intent of Therapy: Non-Curative / Palliative Intent, Discussed with Patient

## 2020-06-26 ENCOUNTER — Other Ambulatory Visit (HOSPITAL_COMMUNITY): Payer: Managed Care, Other (non HMO)

## 2020-06-29 ENCOUNTER — Ambulatory Visit (HOSPITAL_COMMUNITY): Payer: Managed Care, Other (non HMO)

## 2020-06-29 ENCOUNTER — Ambulatory Visit: Payer: Managed Care, Other (non HMO) | Admitting: Radiation Oncology

## 2020-06-29 ENCOUNTER — Other Ambulatory Visit (HOSPITAL_COMMUNITY)
Admit: 2020-06-29 | Discharge: 2020-06-29 | Disposition: A | Discharge status: Home / Self Care | Payer: Managed Care, Other (non HMO) | Source: Ambulatory Visit | Attending: Radiation Oncology | Admitting: Radiation Oncology

## 2020-06-29 ENCOUNTER — Encounter (HOSPITAL_BASED_OUTPATIENT_CLINIC_OR_DEPARTMENT_OTHER): Payer: Self-pay | Admitting: Radiation Oncology

## 2020-06-29 ENCOUNTER — Other Ambulatory Visit: Payer: Self-pay

## 2020-06-29 DIAGNOSIS — Z20822 Contact with and (suspected) exposure to covid-19: Secondary | ICD-10-CM | POA: Diagnosis not present

## 2020-06-29 DIAGNOSIS — Z01812 Encounter for preprocedural laboratory examination: Secondary | ICD-10-CM | POA: Diagnosis not present

## 2020-06-29 LAB — SARS CORONAVIRUS 2 (TAT 6-24 HRS): SARS Coronavirus 2: NEGATIVE

## 2020-06-29 NOTE — Progress Notes (Signed)
Spoke w/ via phone for pre-op interview--- PT Lab needs dos---- no              Lab results------ pt had CBCdiff / CMP done 06-25-2019 results in epic COVID test ------ 06-30-2019 @ 7588 Arrive at ------- 0715 NPO after MN NO Solid Food.  Clear liquids from MN until--- 0615 Medications to take morning of surgery ----- Protonix Diabetic medication ----- n/a Patient Special Instructions ----- n/a Pre-Op special Istructions ----- n/a Patient verbalized understanding of instructions that were given at this phone interview. Patient denies shortness of breath, chest pain, fever, cough at this phone interview.

## 2020-06-30 NOTE — Progress Notes (Signed)
  Radiation Oncology         [phone] ________________________________  Name: Phyllis Hebert MRN: [id number]  Date: 07/01/2020  DOB: [birth year]  CC: [name], MD  [name], MD  HDR BRACHYTHERAPY NOTE  DIAGNOSIS: Stage IVB (cT3b, cN1a, cM1) adenocarcinoma of the cervix  Simple treatment device note: On the operating room the patient had construction of her custom tandem cylinder system. She will be treated with a 60 tandem/ring system with a 6.0 cm  tandem. This conforms to her anatomy without undue discomfort. A rectal paddle was also part of her custom set up device.  Vaginal brachytherapy procedure node: The patient was brought to the [facility] suite. Identity was confirmed. All relevant records and images related to the planned course of therapy were reviewed. The patient freely provided informed written consent to proceed with treatment after reviewing the details related to the planned course of therapy. The consent form was witnessed and verified by the simulation staff. Then, the patient was set-up in a stable reproducible supine position for radiation therapy. The tandem cylinder system was accessed and fiducial markers were placed within the tandem and cylinder.   Verification simulation note: An AP and lateral film was obtained through the pelvis area. This was compared to the patient's planning films documenting accurate position of the tandem/cylinder system for treatment.  High-dose-rate brachytherapy treatment note:   The remote afterloading device was accessed through catheter system and attached to the tandem cylinder system. Patient then proceeded to undergo her second high-dose-rate treatment directed at the cervix. The patient was prescribed a dose of 5.5 gray to be delivered to the high risk clinical target volume.  Patient was treated with 2 channels using 26 dwell positions. Treatment time was 676.2 seconds. The patient tolerated the procedure well.  After completion of her therapy, a radiation survey was performed documenting return of the iridium source into the [employer] safe. The patient was then transferred to the nursing suite.  She then had removal of the rectal paddle followed by the tandem ring system. The patient tolerated the removal well.  PLAN: The patient will return next week for her third high-dose-rate treatment. ________________________________     Blair Promise, PhD, MD  This document serves as a record of services personally performed by Gery Pray, MD. It was created on his behalf by Clerance Lav, a trained medical scribe. The creation of this record is based on the scribe's personal observations and the provider's statements to them. This document has been checked and approved by the attending provider.

## 2020-07-01 ENCOUNTER — Ambulatory Visit (HOSPITAL_COMMUNITY)
Admission: RE | Admit: 2020-07-01 | Discharge: 2020-07-01 | Disposition: A | Discharge status: Home / Self Care | Payer: Managed Care, Other (non HMO) | Source: Ambulatory Visit | Attending: Radiation Oncology | Admitting: Radiation Oncology

## 2020-07-01 ENCOUNTER — Ambulatory Visit (HOSPITAL_BASED_OUTPATIENT_CLINIC_OR_DEPARTMENT_OTHER): Payer: Managed Care, Other (non HMO) | Admitting: Certified Registered Nurse Anesthetist

## 2020-07-01 ENCOUNTER — Ambulatory Visit (HOSPITAL_BASED_OUTPATIENT_CLINIC_OR_DEPARTMENT_OTHER)
Admission: RE | Admit: 2020-07-01 | Discharge: 2020-07-01 | Disposition: A | Discharge status: Other Acute Inpatient Hospital | Payer: Managed Care, Other (non HMO) | Attending: Radiation Oncology | Admitting: Radiation Oncology

## 2020-07-01 ENCOUNTER — Ambulatory Visit
Admission: RE | Admit: 2020-07-01 | Discharge: 2020-07-01 | Disposition: A | Discharge status: Home / Self Care | Payer: Managed Care, Other (non HMO) | Source: Ambulatory Visit | Attending: Radiation Oncology | Admitting: Radiation Oncology

## 2020-07-01 ENCOUNTER — Encounter (HOSPITAL_BASED_OUTPATIENT_CLINIC_OR_DEPARTMENT_OTHER)
Admission: RE | Disposition: A | Discharge status: Other Acute Inpatient Hospital | Payer: Self-pay | Source: Home / Self Care | Attending: Radiation Oncology

## 2020-07-01 ENCOUNTER — Encounter (HOSPITAL_BASED_OUTPATIENT_CLINIC_OR_DEPARTMENT_OTHER): Payer: Self-pay | Admitting: Radiation Oncology

## 2020-07-01 DIAGNOSIS — Z510 Encounter for antineoplastic radiation therapy: Secondary | ICD-10-CM | POA: Insufficient documentation

## 2020-07-01 DIAGNOSIS — C7951 Secondary malignant neoplasm of bone: Secondary | ICD-10-CM | POA: Insufficient documentation

## 2020-07-01 DIAGNOSIS — Z803 Family history of malignant neoplasm of breast: Secondary | ICD-10-CM | POA: Diagnosis not present

## 2020-07-01 DIAGNOSIS — Z8049 Family history of malignant neoplasm of other genital organs: Secondary | ICD-10-CM | POA: Insufficient documentation

## 2020-07-01 DIAGNOSIS — Z9221 Personal history of antineoplastic chemotherapy: Secondary | ICD-10-CM | POA: Diagnosis not present

## 2020-07-01 DIAGNOSIS — C539 Malignant neoplasm of cervix uteri, unspecified: Secondary | ICD-10-CM

## 2020-07-01 DIAGNOSIS — Z923 Personal history of irradiation: Secondary | ICD-10-CM | POA: Insufficient documentation

## 2020-07-01 DIAGNOSIS — Z8616 Personal history of COVID-19: Secondary | ICD-10-CM | POA: Insufficient documentation

## 2020-07-01 DIAGNOSIS — C541 Malignant neoplasm of endometrium: Secondary | ICD-10-CM

## 2020-07-01 HISTORY — PX: OPERATIVE ULTRASOUND: SHX5996

## 2020-07-01 HISTORY — PX: TANDEM RING INSERTION: SHX6199

## 2020-07-01 SURGERY — INSERTION, UTERINE TANDEM AND RING OR CYLINDER, FOR BRACHYTHERAPY
Anesthesia: General

## 2020-07-01 MED ORDER — EPHEDRINE 5 MG/ML INJ
INTRAVENOUS | Status: AC
  Filled 2020-07-01: qty 10

## 2020-07-01 MED ORDER — LACTATED RINGERS IV SOLN: INTRAVENOUS | Status: DC

## 2020-07-01 MED ORDER — DEXAMETHASONE SODIUM PHOSPHATE 10 MG/ML IJ SOLN
INTRAMUSCULAR | Status: DC | PRN
  Administered 2020-07-01: 10 mg via INTRAVENOUS

## 2020-07-01 MED ORDER — FENTANYL CITRATE (PF) 100 MCG/2ML IJ SOLN
25.0000 ug | INTRAMUSCULAR | Status: DC | PRN
  Administered 2020-07-01: 50 ug via INTRAVENOUS
  Administered 2020-07-01: 25 ug via INTRAVENOUS
  Administered 2020-07-01: 50 ug via INTRAVENOUS

## 2020-07-01 MED ORDER — MIDAZOLAM HCL 5 MG/5ML IJ SOLN
INTRAMUSCULAR | Status: DC | PRN
  Administered 2020-07-01: 2 mg via INTRAVENOUS

## 2020-07-01 MED ORDER — LIDOCAINE 2% (20 MG/ML) 5 ML SYRINGE
INTRAMUSCULAR | Status: DC | PRN
  Administered 2020-07-01: 80 mg via INTRAVENOUS

## 2020-07-01 MED ORDER — EPHEDRINE SULFATE-NACL 50-0.9 MG/10ML-% IV SOSY
PREFILLED_SYRINGE | INTRAVENOUS | Status: DC | PRN
  Administered 2020-07-01: 10 mg via INTRAVENOUS

## 2020-07-01 MED ORDER — MIDAZOLAM HCL 2 MG/2ML IJ SOLN
INTRAMUSCULAR | Status: AC
  Filled 2020-07-01: qty 2

## 2020-07-01 MED ORDER — FENTANYL CITRATE (PF) 100 MCG/2ML IJ SOLN
INTRAMUSCULAR | Status: AC
  Filled 2020-07-01: qty 2

## 2020-07-01 MED ORDER — ONDANSETRON HCL 4 MG/2ML IJ SOLN
INTRAMUSCULAR | Status: DC | PRN
  Administered 2020-07-01: 4 mg via INTRAVENOUS

## 2020-07-01 MED ORDER — HYDROMORPHONE HCL 1 MG/ML IJ SOLN
0.5000 mg | Freq: Once | INTRAMUSCULAR | Status: AC
Start: 2020-07-01 — End: 2020-07-01
  Administered 2020-07-01: 0.5 mg via INTRAVENOUS
  Filled 2020-07-01: qty 1

## 2020-07-01 MED ORDER — PROPOFOL 10 MG/ML IV BOLUS
INTRAVENOUS | Status: DC | PRN
  Administered 2020-07-01: 150 mg via INTRAVENOUS

## 2020-07-01 MED ORDER — ONDANSETRON HCL 4 MG/2ML IJ SOLN
INTRAMUSCULAR | Status: AC
  Filled 2020-07-01: qty 2

## 2020-07-01 MED ORDER — DEXAMETHASONE SODIUM PHOSPHATE 10 MG/ML IJ SOLN
INTRAMUSCULAR | Status: AC
  Filled 2020-07-01: qty 1

## 2020-07-01 MED ORDER — HYDROMORPHONE HCL 1 MG/ML IJ SOLN
INTRAMUSCULAR | Status: AC
  Filled 2020-07-01: qty 1

## 2020-07-01 MED ORDER — LIDOCAINE HCL (PF) 2 % IJ SOLN
INTRAMUSCULAR | Status: AC
  Filled 2020-07-01: qty 5

## 2020-07-01 MED ORDER — PROPOFOL 500 MG/50ML IV EMUL
INTRAVENOUS | Status: AC
  Filled 2020-07-01: qty 50

## 2020-07-01 MED ORDER — FENTANYL CITRATE (PF) 100 MCG/2ML IJ SOLN
INTRAMUSCULAR | Status: DC | PRN
  Administered 2020-07-01: 50 ug via INTRAVENOUS
  Administered 2020-07-01: 25 ug via INTRAVENOUS
  Administered 2020-07-01: 50 ug via INTRAVENOUS
  Administered 2020-07-01: 25 ug via INTRAVENOUS
  Administered 2020-07-01: 50 ug via INTRAVENOUS

## 2020-07-01 MED ORDER — POVIDONE-IODINE 10 % EX SWAB: 2.0000 "application " | Freq: Once | CUTANEOUS | Status: DC

## 2020-07-01 SURGICAL SUPPLY — 19 items
BNDG CONFORM 2 STRL LF (GAUZE/BANDAGES/DRESSINGS) IMPLANT
COVER WAND RF STERILE (DRAPES) ×2 IMPLANT
DILATOR CANAL MILEX (MISCELLANEOUS) IMPLANT
DRSG PAD ABDOMINAL 8X10 ST (GAUZE/BANDAGES/DRESSINGS) ×2 IMPLANT
GAUZE 4X4 16PLY RFD (DISPOSABLE) ×2 IMPLANT
GLOVE BIO SURGEON STRL SZ7.5 (GLOVE) ×4 IMPLANT
GOWN STRL REUS W/TWL LRG LVL3 (GOWN DISPOSABLE) ×2 IMPLANT
HOLDER FOLEY CATH W/STRAP (MISCELLANEOUS) ×2 IMPLANT
IV NS 1000ML (IV SOLUTION) ×2
IV NS 1000ML BAXH (IV SOLUTION) ×1 IMPLANT
IV SET EXTENSION GRAVITY 40 LF (IV SETS) ×2 IMPLANT
KIT TURNOVER CYSTO (KITS) ×2 IMPLANT
MAT PREVALON FULL STRYKER (MISCELLANEOUS) ×2 IMPLANT
PACK VAGINAL MINOR WOMEN LF (CUSTOM PROCEDURE TRAY) ×2 IMPLANT
PACKING VAGINAL (PACKING) IMPLANT
PAD OB MATERNITY 4.3X12.25 (PERSONAL CARE ITEMS) IMPLANT
TOWEL OR 17X26 10 PK STRL BLUE (TOWEL DISPOSABLE) ×2 IMPLANT
TRAY FOLEY W/BAG SLVR 14FR LF (SET/KITS/TRAYS/PACK) ×2 IMPLANT
WATER STERILE IRR 500ML POUR (IV SOLUTION) ×2 IMPLANT

## 2020-07-01 NOTE — H&P (View-Only) (Signed)
Radiation Oncology         [phone] ________________________________  History and physical examination  Name: Phyllis Hebert MRN: [id number]  Date: 06/20/2020  DOB: [birth year]  [id number], Phyllis Millet, MD  No ref. provider found    DIAGNOSIS: Stage IVB (cT3b, cN1a, cM1) adenocarcinoma of the cervix  HISTORY OF PRESENT ILLNESS::[name] is a 65 y.o. female who is seen as a courtesy of Dr. Denman George for an opinion concerning radiation therapy as part of management for her recently diagnosed cervical cancer. The patient presented to Dr. Murrell Redden, OB-GYN, on 04/19/2020 with complaint of new onset postmenopausal bleeding. Transvaginal ultrasound performed at that time showed a uterus that measured 5.3 x 8.4 x 4.6 cm with an ill-defined endometrium. There was also noted to be a complex cyst on the left ovary that measured 2.6 cm with peripheral blood flow. Physical exam identified a cervical mass that was biopsied on that same day and revealed invasive adenocarcinoma involving the fibrous stroma. Given that immunostains were positive for CEA and CDX2, differential diagnoses included primary gastric type endocervical adenocarcinoma versus a metastasis from pancreaticobiliary or upper GI primary.   The patient was referred to Dr. Denman George and was seen in consultation on 04/01/2020. At that time, it was recommended that she proceed with further diagnostic work-up including a PET scan and evaluation from gastroenterology for consideration of EGD. If no primary upper gastrointestinal lesion was identified, then a presumptive diagnosis of a primary gastric type endocervical adenocarcinoma would be considered.  The patient was referred to Dr. Tarri Glenn and underwent an EGD and colonoscopy on 04/06/2020. Pathology from the procedure did not reveal any dysplasia, malignancy, or intestinal metaplasia of the duodenum, gastric antrum, gastric body, gastrice nodule, lesser curve ulcers of the stomach, gastric  polyps, and descending colon polyp.  PET scan on 04/19/2020 showed a hypermetabolic uterine cervix mass that was compatible with the known primary cervical malignancy, with hypermetabolism extending into the upper third of the vagina and extending throughout the uterine body. There was no frank extrauterine extension. However, there was noted to be mild to moderate left hydroureteronephrosis to the level of the left UVJ, concerning for tumor involvement of the left UVJ. Additionally, there was hypermetabolic sclerotic right ischial bone metastasis and two indeterminate small scattered slid pulmonary nodules that were below PET resolution but still warranted attention on follow-up chest CT scan in three months.   The patient was seen in consultation with Dr. Alvy Bimler on 04/19/2020, during which time it was recommended that she proceed with palliative treatment with Cisplatin.   The patient has recently completed her external beam radiation therapy and radiosensitizing chemotherapy.  She is now ready to proceed with her  intracavitary brachytherapy treatment using iridium 192 as the high-dose-rate source.  PREVIOUS RADIATION THERAPY: No  PAST MEDICAL HISTORY:  Past Medical History:  Diagnosis Date  . Anemia   . Anxiety   . Arthritis   . Cervical cancer, FIGO stage IVB [facility]) oncologist--- dr gorsuch/ dr Sondra Come   dx inital dx 09/ 2021, w/ mets to bone, started chemo 04-30-2020 and stopped 05-21-2020 due to worsening pancytopenia;  completed IMRT 06-16-2020 and scheduled to start brachytherapy boost high dose 06-21-2020  . Chemotherapy-induced nausea   . Cutaneous lupus erythematosus    followed by [facility]-- Marella Chimes PA  . Dyslipidemia   . Family history of breast cancer   . Family history of uterine cancer   . GERD (gastroesophageal reflux disease)   . Hemorrhoids   .  History of 2019 novel coronavirus disease (COVID-19) 06/20/2019   [facility] , had mild symptoms that  resolved  . History of colon polyps 04/06/2020   hyerplastic  . History of external beam radiation therapy 05-03-2020  to 06-16-2020   cervical cancer  . History of gastric polyp 04/06/2020   hyperplastic  . History of gastric ulcer   . Pancytopenia due to chemotherapy ([place])   . Wears glasses     PAST SURGICAL HISTORY: Past Surgical History:  Procedure Laterality Date  . CATARACT EXTRACTION W/ INTRAOCULAR LENS IMPLANT Left 12/2019  . COLONOSCOPY WITH ESOPHAGOGASTRODUODENOSCOPY (EGD)  04-06-2020  dr Raliegh Ip. beavers  . ENDOMETRIAL ABLATION W/ NOVASURE  2010  . IR IMAGING GUIDED PORT INSERTION  04/26/2020  . OPERATIVE ULTRASOUND N/A 06/21/2020   Procedure: OPERATIVE ULTRASOUND;  Surgeon: Gery Pray, MD;  Location: [facility];  Service: Urology;  Laterality: N/A;  TECH  . TANDEM RING INSERTION N/A 06/21/2020   Procedure: TANDEM RING INSERTION;  Surgeon: Gery Pray, MD;  Location: [facility];  Service: Urology;  Laterality: N/A;    FAMILY HISTORY:  Family History  Problem Relation Age of Onset  . Hypertension Mother   . Emphysema Mother   . Uterine cancer Mother 44  . Hypertension Father   . Stroke Father   . Hypertension Brother   . Healthy Daughter   . Breast cancer Maternal Aunt        d. 52  . Leukemia Cousin        d. 49s    SOCIAL HISTORY:  Social History   Tobacco Use  . Smoking status: Never Smoker  . Smokeless tobacco: Never Used  Vaping Use  . Vaping Use: Never used  Substance Use Topics  . Alcohol use: No  . Drug use: Never    ALLERGIES:  Allergies  Allergen Reactions  . Penicillins Rash    MEDICATIONS:  Current Facility-Administered Medications  Medication Dose Route Frequency Provider Last Rate Last Admin  . lactated ringers infusion   Intravenous Continuous Belinda Block, MD 50 mL/hr at 07/01/20 0745 New Bag at 07/01/20 0745  . povidone-iodine 10 % swab 2 application  2 application Topical Once Gery Pray, MD         REVIEW OF SYSTEMS:  A 10+ POINT REVIEW OF SYSTEMS WAS OBTAINED including neurology, dermatology, psychiatry, cardiac, respiratory, lymph, extremities, GI, GU, musculoskeletal, constitutional, reproductive, HEENT.  The patient denies any significant pelvic pain.  She has noticed some discomfort since her PET scan primarily in the upper buttocks/sacrum area.  She denies any significant vaginal bleeding at this time.   PHYSICAL EXAM:  height is 5\' 5"  (1.651 m) and weight is 208 lb 12.8 oz (94.7 kg). Her oral temperature is 98 F (36.7 C). Her blood pressure is 133/77 and her pulse is 71. Her respiration is 17 and oxygen saturation is 99%.   General: Alert and oriented, in no acute distress HEENT: Head is normocephalic. Extraocular movements are intact. Oropharynx is clear. Neck: Neck is supple, no palpable cervical or supraclavicular lymphadenopathy. Heart: Regular in rate and rhythm with no murmurs, rubs, or gallops. Chest: Clear to auscultation bilaterally, with no rhonchi, wheezes, or rales. Abdomen: Soft, nontender, nondistended, with no rigidity or guarding. Extremities: No cyanosis or edema. Lymphatics: see Neck Exam Skin: No concerning lesions. Musculoskeletal: symmetric strength and muscle tone throughout. Neurologic: Cranial nerves II through XII are grossly intact. No obvious focalities. Speech is fluent. Coordination is  intact. Psychiatric: Judgment and insight are intact. Affect is appropriate. On pelvic exam the cervix is replaced by an approximately 4 to 5 cm hard mass.  This mass bleeds easily with examination.  On rectovaginal examination there appears to be bilateral parametrial involvement more so along the right side.  No obvious sidewall involvement  ECOG = 1   LABORATORY DATA:  Lab Results  Component Value Date   WBC 5.4 06/24/2020   HGB 10.0 (L) 06/24/2020   HCT 29.9 (L) 06/24/2020   MCV 89.8 06/24/2020   PLT 178 06/24/2020   NEUTROABS 3.8 06/24/2020   Lab  Results  Component Value Date   NA 140 06/24/2020   K 3.4 (L) 06/24/2020   CL 105 06/24/2020   CO2 25 06/24/2020   GLUCOSE 106 (H) 06/24/2020   CREATININE 0.99 06/24/2020   CALCIUM 9.8 06/24/2020       IMPRESSION: Stage IVB (cT3b, cN1a, cM1) adenocarcinoma of the cervix  The patient has an isolated metastatic deposit in the right ischial region.  I discussed this issue with Dr. Denman George and Dr. Alvy Bimler.  Given the patient's excellent performance status and minimal metastatic disease we will proceed with an aggressive course of radiation including external beam radiation therapy directed at the pelvis which will encompass the sclerotic metastasis in the right pelvis region.  This will also be accompanied by radiosensitizing weekly cisplatin-based radiosensitizing chemotherapy.  Patient will then proceed with brachytherapy.  Her case will be discussed at the multidisciplinary oncologic oncology conference early next week for further input.  This will address the anticipated number of brachytherapy procedures do prior to proceeding with  full dose chemotherapy.  Patient has recently completed her external beam and radiosensitizing chemotherapy.  Due to low blood counts some of her radiosensitizing chemotherapy sessions were cancelled.  Recent blood work shows the patient is recovering with her blood work and is now ready to proceed with her brachytherapy session.  The patient is to receive a total of 5 intracavitary brachytherapy treatments directed at the cervix using iridium 192 is the high-dose-rate source    PLAN: The patient will be taken to the operating room on January 13 at 9:15 for her second brachytherapy procedure. She will have exam under anesthesia,  dilation of the cervical os followed by placement of a tandem ring apparatus in preparation for high-dose-rate treatment.  ------------------------------------------------  Blair Promise, PhD, MD

## 2020-07-01 NOTE — H&P (View-Only) (Signed)
Radiation Oncology         [phone]) [phone] ________________________________  History and physical examination  Name: ABBEE Hebert MRN: [id number]  Date: 06/20/2020  DOB: 27-Oct-1955  [id number], [name], MD  No ref. provider found    DIAGNOSIS: Stage IVB (cT3b, cN1a, cM1) adenocarcinoma of the cervix  HISTORY OF PRESENT ILLNESS::Phyllis Hebert is a 65 y.o. female who is seen as a courtesy of Dr. Denman George for an opinion concerning radiation therapy as part of management for her recently diagnosed cervical cancer. The patient presented to Dr. Murrell Redden, OB-GYN, on 04/19/2020 with complaint of new onset postmenopausal bleeding. Transvaginal ultrasound performed at that time showed a uterus that measured 5.3 x 8.4 x 4.6 cm with an ill-defined endometrium. There was also noted to be a complex cyst on the left ovary that measured 2.6 cm with peripheral blood flow. Physical exam identified a cervical mass that was biopsied on that same day and revealed invasive adenocarcinoma involving the fibrous stroma. Given that immunostains were positive for CEA and CDX2, differential diagnoses included primary gastric type endocervical adenocarcinoma versus a metastasis from pancreaticobiliary or upper GI primary.   The patient was referred to Dr. Denman George and was seen in consultation on 04/01/2020. At that time, it was recommended that she proceed with further diagnostic work-up including a PET scan and evaluation from gastroenterology for consideration of EGD. If no primary upper gastrointestinal lesion was identified, then a presumptive diagnosis of a primary gastric type endocervical adenocarcinoma would be considered.  The patient was referred to Dr. Tarri Glenn and underwent an EGD and colonoscopy on 04/06/2020. Pathology from the procedure did not reveal any dysplasia, malignancy, or intestinal metaplasia of the duodenum, gastric antrum, gastric body, gastrice nodule, lesser curve ulcers of the stomach, gastric  polyps, and descending colon polyp.  PET scan on 04/19/2020 showed a hypermetabolic uterine cervix mass that was compatible with the known primary cervical malignancy, with hypermetabolism extending into the upper third of the vagina and extending throughout the uterine body. There was no frank extrauterine extension. However, there was noted to be mild to moderate left hydroureteronephrosis to the level of the left UVJ, concerning for tumor involvement of the left UVJ. Additionally, there was hypermetabolic sclerotic right ischial bone metastasis and two indeterminate small scattered slid pulmonary nodules that were below PET resolution but still warranted attention on follow-up chest CT scan in three months.   The patient was seen in consultation with Dr. Alvy Bimler on 04/19/2020, during which time it was recommended that she proceed with palliative treatment with Cisplatin.   The patient has recently completed her external beam radiation therapy and radiosensitizing chemotherapy.  She is now ready to proceed with her  intracavitary brachytherapy treatment using iridium 192 as the high-dose-rate source.  PREVIOUS RADIATION THERAPY: No  PAST MEDICAL HISTORY:  Past Medical History:  Diagnosis Date  . Anemia   . Anxiety   . Arthritis   . Cervical cancer, FIGO stage IVB [facility]) oncologist--- dr gorsuch/ dr Sondra Come   dx inital dx 09/ 2021, w/ mets to bone, started chemo 04-30-2020 and stopped 05-21-2020 due to worsening pancytopenia;  completed IMRT 06-16-2020 and scheduled to start brachytherapy boost high dose 06-21-2020  . Chemotherapy-induced nausea   . Cutaneous lupus erythematosus    followed by [facility]-- Marella Chimes PA  . Dyslipidemia   . Family history of breast cancer   . Family history of uterine cancer   . GERD (gastroesophageal reflux disease)   . Hemorrhoids   .  History of 2019 novel coronavirus disease (COVID-19) 06/20/2019   [facility] , had mild symptoms that  resolved  . History of colon polyps 04/06/2020   hyerplastic  . History of external beam radiation therapy 05-03-2020  to 06-16-2020   cervical cancer  . History of gastric polyp 04/06/2020   hyperplastic  . History of gastric ulcer   . Pancytopenia due to chemotherapy ([place])   . Wears glasses     PAST SURGICAL HISTORY: Past Surgical History:  Procedure Laterality Date  . CATARACT EXTRACTION W/ INTRAOCULAR LENS IMPLANT Left 12/2019  . COLONOSCOPY WITH ESOPHAGOGASTRODUODENOSCOPY (EGD)  04-06-2020  dr Raliegh Ip. beavers  . ENDOMETRIAL ABLATION W/ NOVASURE  2010  . IR IMAGING GUIDED PORT INSERTION  04/26/2020  . OPERATIVE ULTRASOUND N/A 06/21/2020   Procedure: OPERATIVE ULTRASOUND;  Surgeon: Gery Pray, MD;  Location: [facility];  Service: Urology;  Laterality: N/A;  TECH  . TANDEM RING INSERTION N/A 06/21/2020   Procedure: TANDEM RING INSERTION;  Surgeon: Gery Pray, MD;  Location: [facility];  Service: Urology;  Laterality: N/A;    FAMILY HISTORY:  Family History  Problem Relation Age of Onset  . Hypertension Mother   . Emphysema Mother   . Uterine cancer Mother 44  . Hypertension Father   . Stroke Father   . Hypertension Brother   . Healthy Daughter   . Breast cancer Maternal Aunt        d. 52  . Leukemia Cousin        d. 49s    SOCIAL HISTORY:  Social History   Tobacco Use  . Smoking status: Never Smoker  . Smokeless tobacco: Never Used  Vaping Use  . Vaping Use: Never used  Substance Use Topics  . Alcohol use: No  . Drug use: Never    ALLERGIES:  Allergies  Allergen Reactions  . Penicillins Rash    MEDICATIONS:  Current Facility-Administered Medications  Medication Dose Route Frequency Provider Last Rate Last Admin  . lactated ringers infusion   Intravenous Continuous Belinda Block, MD 50 mL/hr at 07/01/20 0745 New Bag at 07/01/20 0745  . povidone-iodine 10 % swab 2 application  2 application Topical Once Gery Pray, MD         REVIEW OF SYSTEMS:  A 10+ POINT REVIEW OF SYSTEMS WAS OBTAINED including neurology, dermatology, psychiatry, cardiac, respiratory, lymph, extremities, GI, GU, musculoskeletal, constitutional, reproductive, HEENT.  The patient denies any significant pelvic pain.  She has noticed some discomfort since her PET scan primarily in the upper buttocks/sacrum area.  She denies any significant vaginal bleeding at this time.   PHYSICAL EXAM:  height is 5\' 5"  (1.651 m) and weight is 208 lb 12.8 oz (94.7 kg). Her oral temperature is 98 F (36.7 C). Her blood pressure is 133/77 and her pulse is 71. Her respiration is 17 and oxygen saturation is 99%.   General: Alert and oriented, in no acute distress HEENT: Head is normocephalic. Extraocular movements are intact. Oropharynx is clear. Neck: Neck is supple, no palpable cervical or supraclavicular lymphadenopathy. Heart: Regular in rate and rhythm with no murmurs, rubs, or gallops. Chest: Clear to auscultation bilaterally, with no rhonchi, wheezes, or rales. Abdomen: Soft, nontender, nondistended, with no rigidity or guarding. Extremities: No cyanosis or edema. Lymphatics: see Neck Exam Skin: No concerning lesions. Musculoskeletal: symmetric strength and muscle tone throughout. Neurologic: Cranial nerves II through XII are grossly intact. No obvious focalities. Speech is fluent. Coordination is  intact. Psychiatric: Judgment and insight are intact. Affect is appropriate. On pelvic exam the cervix is replaced by an approximately 4 to 5 cm hard mass.  This mass bleeds easily with examination.  On rectovaginal examination there appears to be bilateral parametrial involvement more so along the right side.  No obvious sidewall involvement  ECOG = 1   LABORATORY DATA:  Lab Results  Component Value Date   WBC 5.4 06/24/2020   HGB 10.0 (L) 06/24/2020   HCT 29.9 (L) 06/24/2020   MCV 89.8 06/24/2020   PLT 178 06/24/2020   NEUTROABS 3.8 06/24/2020   Lab  Results  Component Value Date   NA 140 06/24/2020   K 3.4 (L) 06/24/2020   CL 105 06/24/2020   CO2 25 06/24/2020   GLUCOSE 106 (H) 06/24/2020   CREATININE 0.99 06/24/2020   CALCIUM 9.8 06/24/2020       IMPRESSION: Stage IVB (cT3b, cN1a, cM1) adenocarcinoma of the cervix  The patient has an isolated metastatic deposit in the right ischial region.  I discussed this issue with Dr. Denman George and Dr. Alvy Bimler.  Given the patient's excellent performance status and minimal metastatic disease we will proceed with an aggressive course of radiation including external beam radiation therapy directed at the pelvis which will encompass the sclerotic metastasis in the right pelvis region.  This will also be accompanied by radiosensitizing weekly cisplatin-based radiosensitizing chemotherapy.  Patient will then proceed with brachytherapy.  Her case will be discussed at the multidisciplinary oncologic oncology conference early next week for further input.  This will address the anticipated number of brachytherapy procedures do prior to proceeding with  full dose chemotherapy.  Patient has recently completed her external beam and radiosensitizing chemotherapy.  Due to low blood counts some of her radiosensitizing chemotherapy sessions were cancelled.  Recent blood work shows the patient is recovering with her blood work and is now ready to proceed with her brachytherapy session.  The patient is to receive a total of 5 intracavitary brachytherapy treatments directed at the cervix using iridium 192 is the high-dose-rate source    PLAN: The patient will be taken to the operating room on January 13 at 9:15 for her second brachytherapy procedure. She will have exam under anesthesia,  dilation of the cervical os followed by placement of a tandem ring apparatus in preparation for high-dose-rate treatment.  ------------------------------------------------  Blair Promise, PhD, MD

## 2020-07-01 NOTE — Discharge Instructions (Signed)

## 2020-07-01 NOTE — Anesthesia Postprocedure Evaluation (Signed)
Anesthesia Post Note  Patient: Phyllis Hebert  Procedure(s) Performed: TANDEM RING INSERTION (N/A ) OPERATIVE ULTRASOUND (N/A )     Patient location during evaluation: PACU Anesthesia Type: General Level of consciousness: awake Pain management: pain level controlled Vital Signs Assessment: post-procedure vital signs reviewed and stable Respiratory status: spontaneous breathing Cardiovascular status: stable Postop Assessment: no apparent nausea or vomiting Anesthetic complications: no   No complications documented.  Last Vitals:  Vitals:   07/01/20 0928 07/01/20 0930  BP: 133/81 (!) 141/74  Pulse: 68 77  Resp: 11 14  Temp: 36.4 C   SpO2: 100% 100%    Last Pain:  Vitals:   07/01/20 0946  TempSrc:   PainSc: 5                  Madysin Crisp

## 2020-07-01 NOTE — Progress Notes (Signed)
Patient and her belongings picked up from [facility] PACU and transported to Radiation Oncology [facility].  Patient was premedicated with Fentanyl prior to transport.

## 2020-07-01 NOTE — Op Note (Signed)
07/01/2020  10:30 AM  PATIENT:  Phyllis Hebert  65 y.o. female  PRE-OPERATIVE DIAGNOSIS:  CERVICAL CANCER  POST-OPERATIVE DIAGNOSIS:  CERVICAL CANCER  PROCEDURE:  Procedure(s) with comments: TANDEM RING INSERTION (N/A) OPERATIVE ULTRASOUND (N/A) - TECH  SURGEON:  Surgeon(s) and Role:    * Gery Pray, MD - Primary  PHYSICIAN ASSISTANT:   ASSISTANTS: none   ANESTHESIA:   general  EBL:  10 mL   BLOOD ADMINISTERED:none  DRAINS: Urinary Catheter (Foley)   LOCAL MEDICATIONS USED:  NONE  SPECIMEN:  No Specimen  DISPOSITION OF SPECIMEN:  N/A  COUNTS:  YES  TOURNIQUET:  * No tourniquets in log *  DICTATION: Patient was taken to outpatient OR #3.  Timeout was performed for the estimated length of procedure, procedure, preoperative antibiotics, allergies.   Patient was prepped and draped in the usual sterile fashion and placed in the dorsolithotomy position.  A Foley catheter was placed without difficulty.  Clear yellowish urine was obtained.  Patient then underwent exam under anesthesia,based on intraoperative exam and ultrasound findings the cervical mass was approximately 3.5 x 2.6 cm, a significant decrease in size compared to pretreatment.  The cervical mass however continue to be quite firm with firm induration throughout the cervix and parametrial area more so along the right side.  Since her first brachytherapy procedure the cervical mass seem to be what softer on exam. The cervical os was flush with the upper vaginal area and firm nodularity was palpated along the region of the cervix.  Patient proceeded to undergo sounding of the uterus.  The uterus sounded  Approximately to 8.2 cm.  Patient then had serial dilation of the cervical os.  Initial ultrasound imaging after instillation of approximately 250 cc of sterile water in the bladder were excellent however once patient had dilation of the cervical os air passed into the endometrial cavity limiting the ultrasound images  but still adequate.  After appropriate dilation the patient then had placement of a 60 mm cervical sleeve, 60 mm, 60 degree tandem within the endometrial cavity and cervical region.  Attached to this device was a 60 degree cervical ring with a small shielding In place.  The patient then had placement of a rectal paddle posteriorly to limit dose to the rectal mucosa.  Patient tolerated the procedure well was transferred for to the recovery room in stable condition.  Later in the day the patient will be transferred to radiation oncology for simulation planning and her first high-dose-rate treatment.  Patient is to receive a total of 5 high-dose-rate treatments with estimated dose per treatment to the high risk clinical target volume of 5.5 Gy.  Iridium 192 will be the high-dose-rate source.  PLAN OF CARE: Transferred to radiation oncology for planning and treatment  PATIENT DISPOSITION:  PACU - hemodynamically stable.   Delay start of Pharmacological VTE agent (>24hrs) due to surgical blood loss or risk of bleeding: not applicable

## 2020-07-01 NOTE — Interval H&P Note (Signed)
History and Physical Interval Note:  07/01/2020 8:28 AM  Phyllis Hebert  has presented today for surgery, with the diagnosis of CERVICAL CANCER.  The various methods of treatment have been discussed with the patient and family. After consideration of risks, benefits and other options for treatment, the patient has consented to  Procedure(s) with comments: TANDEM RING INSERTION (N/A) OPERATIVE ULTRASOUND (N/A) - TECH as a surgical intervention.  The patient's history has been reviewed, patient examined, no change in status, stable for surgery.  I have reviewed the patient's chart and labs.  Questions were answered to the patient's satisfaction.     Gery Pray

## 2020-07-01 NOTE — Anesthesia Preprocedure Evaluation (Signed)
Anesthesia Evaluation  Patient identified by MRN, date of birth, ID band Patient awake    Reviewed: Allergy & Precautions, NPO status , Patient's Chart, lab work & pertinent test results  Airway Mallampati: II  TM Distance: >3 FB     Dental   Pulmonary neg pulmonary ROS,    breath sounds clear to auscultation       Cardiovascular negative cardio ROS   Rhythm:Regular Rate:Normal     Neuro/Psych negative neurological ROS     GI/Hepatic Neg liver ROS, GERD  ,  Endo/Other    Renal/GU Renal disease     Musculoskeletal   Abdominal   Peds  Hematology  (+) anemia ,   Anesthesia Other Findings   Reproductive/Obstetrics                             Anesthesia Physical Anesthesia Plan  ASA: II  Anesthesia Plan: General   Post-op Pain Management:    Induction: Intravenous  PONV Risk Score and Plan: 3 and Ondansetron, Dexamethasone and Midazolam  Airway Management Planned: LMA  Additional Equipment:   Intra-op Plan:   Post-operative Plan: Extubation in OR  Informed Consent: I have reviewed the patients History and Physical, chart, labs and discussed the procedure including the risks, benefits and alternatives for the proposed anesthesia with the patient or authorized representative who has indicated his/her understanding and acceptance.     Dental advisory given  Plan Discussed with: CRNA and Anesthesiologist  Anesthesia Plan Comments:         Anesthesia Quick Evaluation

## 2020-07-01 NOTE — H&P (Signed)
Radiation Oncology         [phone] ________________________________  History and physical examination  Name: Phyllis Hebert MRN: [id number]  Date: 06/20/2020  DOB: 04/19/1956  [id number], Phyllis Millet, Phyllis Hebert  No ref. provider found    DIAGNOSIS: Stage IVB (cT3b, cN1a, cM1) adenocarcinoma of the cervix  HISTORY OF PRESENT ILLNESS::Phyllis Hebert is a 65 y.o. female who is seen as a courtesy of Phyllis Hebert for an opinion concerning radiation therapy as part of management for her recently diagnosed cervical cancer. The patient presented to Phyllis Hebert, OB-GYN, on 04/19/2020 with complaint of new onset postmenopausal bleeding. Transvaginal ultrasound performed at that time showed a uterus that measured 5.3 x 8.4 x 4.6 cm with an ill-defined endometrium. There was also noted to be a complex cyst on the left ovary that measured 2.6 cm with peripheral blood flow. Physical exam identified a cervical mass that was biopsied on that same day and revealed invasive adenocarcinoma involving the fibrous stroma. Given that immunostains were positive for CEA and CDX2, differential diagnoses included primary gastric type endocervical adenocarcinoma versus a metastasis from pancreaticobiliary or upper GI primary.   The patient was referred to Phyllis Hebert and was seen in consultation on 04/01/2020. At that time, it was recommended that she proceed with further diagnostic work-up including a PET scan and evaluation from gastroenterology for consideration of EGD. If no primary upper gastrointestinal lesion was identified, then a presumptive diagnosis of a primary gastric type endocervical adenocarcinoma would be considered.  The patient was referred to Phyllis Hebert and underwent an EGD and colonoscopy on 04/06/2020. Pathology from the procedure did not reveal any dysplasia, malignancy, or intestinal metaplasia of the duodenum, gastric antrum, gastric body, gastrice nodule, lesser curve ulcers of the stomach, gastric  polyps, and descending colon polyp.  PET scan on 04/19/2020 showed a hypermetabolic uterine cervix mass that was compatible with the known primary cervical malignancy, with hypermetabolism extending into the upper third of the vagina and extending throughout the uterine body. There was no frank extrauterine extension. However, there was noted to be mild to moderate left hydroureteronephrosis to the level of the left UVJ, concerning for tumor involvement of the left UVJ. Additionally, there was hypermetabolic sclerotic right ischial bone metastasis and two indeterminate small scattered slid pulmonary nodules that were below PET resolution but still warranted attention on follow-up chest CT scan in three months.   The patient was seen in consultation with Phyllis Hebert on 04/19/2020, during which time it was recommended that she proceed with palliative treatment with Cisplatin.   The patient has recently completed her external beam radiation therapy and radiosensitizing chemotherapy.  She is now ready to proceed with her  intracavitary brachytherapy treatment using iridium 192 as the high-dose-rate source.  PREVIOUS RADIATION THERAPY: No  PAST MEDICAL HISTORY:  Past Medical History:  Diagnosis Date  . Anemia   . Anxiety   . Arthritis   . Cervical cancer, FIGO stage IVB [facility]) oncologist--- dr gorsuch/ dr Sondra Come   dx inital dx 09/ 2021, w/ mets to bone, started chemo 04-30-2020 and stopped 05-21-2020 due to worsening pancytopenia;  completed IMRT 06-16-2020 and scheduled to start brachytherapy boost high dose 06-21-2020  . Chemotherapy-induced nausea   . Cutaneous lupus erythematosus    followed by [facility]-- Phyllis Hebert  . Dyslipidemia   . Family history of breast cancer   . Family history of uterine cancer   . GERD (gastroesophageal reflux disease)   . Hemorrhoids   .  History of 2019 novel coronavirus disease (COVID-19) 06/20/2019   [facility] , had mild symptoms that  resolved  . History of colon polyps 04/06/2020   hyerplastic  . History of external beam radiation therapy 05-03-2020  to 06-16-2020   cervical cancer  . History of gastric polyp 04/06/2020   hyperplastic  . History of gastric ulcer   . Pancytopenia due to chemotherapy ([place])   . Wears glasses     PAST SURGICAL HISTORY: Past Surgical History:  Procedure Laterality Date  . CATARACT EXTRACTION W/ INTRAOCULAR LENS IMPLANT Left 12/2019  . COLONOSCOPY WITH ESOPHAGOGASTRODUODENOSCOPY (EGD)  04-06-2020  dr Phyllis Hebert  . ENDOMETRIAL ABLATION W/ NOVASURE  2010  . IR IMAGING GUIDED PORT INSERTION  04/26/2020  . OPERATIVE ULTRASOUND N/A 06/21/2020   Procedure: OPERATIVE ULTRASOUND;  Surgeon: Phyllis Pray, Phyllis Hebert;  Location: [facility];  Service: Urology;  Laterality: N/A;  TECH  . TANDEM RING INSERTION N/A 06/21/2020   Procedure: TANDEM RING INSERTION;  Surgeon: Phyllis Pray, Phyllis Hebert;  Location: [facility];  Service: Urology;  Laterality: N/A;    FAMILY HISTORY:  Family History  Problem Relation Age of Onset  . Hypertension Mother   . Emphysema Mother   . Uterine cancer Mother 44  . Hypertension Father   . Stroke Father   . Hypertension Brother   . Healthy Daughter   . Breast cancer Maternal Aunt        d. 52  . Leukemia Cousin        d. 49s    SOCIAL HISTORY:  Social History   Tobacco Use  . Smoking status: Never Smoker  . Smokeless tobacco: Never Used  Vaping Use  . Vaping Use: Never used  Substance Use Topics  . Alcohol use: No  . Drug use: Never    ALLERGIES:  Allergies  Allergen Reactions  . Penicillins Rash    MEDICATIONS:  Current Facility-Administered Medications  Medication Dose Route Frequency Provider Last Rate Last Admin  . lactated ringers infusion   Intravenous Continuous Phyllis Block, Phyllis Hebert 50 mL/hr at 07/01/20 0745 New Bag at 07/01/20 0745  . povidone-iodine 10 % swab 2 application  2 application Topical Once Phyllis Pray, Phyllis Hebert         REVIEW OF SYSTEMS:  A 10+ POINT REVIEW OF SYSTEMS WAS OBTAINED including neurology, dermatology, psychiatry, cardiac, respiratory, lymph, extremities, GI, GU, musculoskeletal, constitutional, reproductive, HEENT.  The patient denies any significant pelvic pain.  She has noticed some discomfort since her PET scan primarily in the upper buttocks/sacrum area.  She denies any significant vaginal bleeding at this time.   PHYSICAL EXAM:  height is 5\' 5"  (1.651 m) and weight is 208 lb 12.8 oz (94.7 kg). Her oral temperature is 98 F (36.7 C). Her blood pressure is 133/77 and her pulse is 71. Her respiration is 17 and oxygen saturation is 99%.   General: Alert and oriented, in no acute distress HEENT: Head is normocephalic. Extraocular movements are intact. Oropharynx is clear. Neck: Neck is supple, no palpable cervical or supraclavicular lymphadenopathy. Heart: Regular in rate and rhythm with no murmurs, rubs, or gallops. Chest: Clear to auscultation bilaterally, with no rhonchi, wheezes, or rales. Abdomen: Soft, nontender, nondistended, with no rigidity or guarding. Extremities: No cyanosis or edema. Lymphatics: see Neck Exam Skin: No concerning lesions. Musculoskeletal: symmetric strength and muscle tone throughout. Neurologic: Cranial nerves II through XII are grossly intact. No obvious focalities. Speech is fluent. Coordination is  intact. Psychiatric: Judgment and insight are intact. Affect is appropriate. On pelvic exam the cervix is replaced by an approximately 4 to 5 cm hard mass.  This mass bleeds easily with examination.  On rectovaginal examination there appears to be bilateral parametrial involvement more so along the right side.  No obvious sidewall involvement  ECOG = 1   LABORATORY DATA:  Lab Results  Component Value Date   WBC 5.4 06/24/2020   HGB 10.0 (L) 06/24/2020   HCT 29.9 (L) 06/24/2020   MCV 89.8 06/24/2020   PLT 178 06/24/2020   NEUTROABS 3.8 06/24/2020   Lab  Results  Component Value Date   NA 140 06/24/2020   K 3.4 (L) 06/24/2020   CL 105 06/24/2020   CO2 25 06/24/2020   GLUCOSE 106 (H) 06/24/2020   CREATININE 0.99 06/24/2020   CALCIUM 9.8 06/24/2020       IMPRESSION: Stage IVB (cT3b, cN1a, cM1) adenocarcinoma of the cervix  The patient has an isolated metastatic deposit in the right ischial region.  I discussed this issue with Phyllis Hebert and Phyllis Hebert.  Given the patient's excellent performance status and minimal metastatic disease we will proceed with an aggressive course of radiation including external beam radiation therapy directed at the pelvis which will encompass the sclerotic metastasis in the right pelvis region.  This will also be accompanied by radiosensitizing weekly cisplatin-based radiosensitizing chemotherapy.  Patient will then proceed with brachytherapy.  Her case will be discussed at the multidisciplinary oncologic oncology conference early next week for further input.  This will address the anticipated number of brachytherapy procedures do prior to proceeding with  full dose chemotherapy.  Patient has recently completed her external beam and radiosensitizing chemotherapy.  Due to low blood counts some of her radiosensitizing chemotherapy sessions were cancelled.  Recent blood work shows the patient is recovering with her blood work and is now ready to proceed with her brachytherapy session.  The patient is to receive a total of 5 intracavitary brachytherapy treatments directed at the cervix using iridium 192 is the high-dose-rate source    PLAN: The patient will be taken to the operating room on January 13 at 9:15 for her second brachytherapy procedure. She will have exam under anesthesia,  dilation of the cervical os followed by placement of a tandem ring apparatus in preparation for high-dose-rate treatment.  ------------------------------------------------  Blair Promise, PhD, Phyllis Hebert

## 2020-07-01 NOTE — H&P (View-Only) (Signed)
Radiation Oncology         [phone] ________________________________  History and physical examination  Name: Phyllis Hebert MRN: [id number]  Date: 06/20/2020  DOB: 03-14-1956  [id number], [name], MD  No ref. provider found    DIAGNOSIS: Stage IVB (cT3b, cN1a, cM1) adenocarcinoma of the cervix  HISTORY OF PRESENT ILLNESS::Phyllis Hebert is a 65 y.o. female who is seen as a courtesy of Dr. Denman George for an opinion concerning radiation therapy as part of management for her recently diagnosed cervical cancer. The patient presented to Dr. Murrell Redden, OB-GYN, on 04/19/2020 with complaint of new onset postmenopausal bleeding. Transvaginal ultrasound performed at that time showed a uterus that measured 5.3 x 8.4 x 4.6 cm with an ill-defined endometrium. There was also noted to be a complex cyst on the left ovary that measured 2.6 cm with peripheral blood flow. Physical exam identified a cervical mass that was biopsied on that same day and revealed invasive adenocarcinoma involving the fibrous stroma. Given that immunostains were positive for CEA and CDX2, differential diagnoses included primary gastric type endocervical adenocarcinoma versus a metastasis from pancreaticobiliary or upper GI primary.   The patient was referred to Dr. Denman George and was seen in consultation on 04/01/2020. At that time, it was recommended that she proceed with further diagnostic work-up including a PET scan and evaluation from gastroenterology for consideration of EGD. If no primary upper gastrointestinal lesion was identified, then a presumptive diagnosis of a primary gastric type endocervical adenocarcinoma would be considered.  The patient was referred to Dr. Tarri Glenn and underwent an EGD and colonoscopy on 04/06/2020. Pathology from the procedure did not reveal any dysplasia, malignancy, or intestinal metaplasia of the duodenum, gastric antrum, gastric body, gastrice nodule, lesser curve ulcers of the stomach, gastric  polyps, and descending colon polyp.  PET scan on 04/19/2020 showed a hypermetabolic uterine cervix mass that was compatible with the known primary cervical malignancy, with hypermetabolism extending into the upper third of the vagina and extending throughout the uterine body. There was no frank extrauterine extension. However, there was noted to be mild to moderate left hydroureteronephrosis to the level of the left UVJ, concerning for tumor involvement of the left UVJ. Additionally, there was hypermetabolic sclerotic right ischial bone metastasis and two indeterminate small scattered slid pulmonary nodules that were below PET resolution but still warranted attention on follow-up chest CT scan in three months.   The patient was seen in consultation with Dr. Alvy Bimler on 04/19/2020, during which time it was recommended that she proceed with palliative treatment with Cisplatin.   The patient has recently completed her external beam radiation therapy and radiosensitizing chemotherapy.  She is now ready to proceed with her  intracavitary brachytherapy treatment using iridium 192 as the high-dose-rate source.  PREVIOUS RADIATION THERAPY: No  PAST MEDICAL HISTORY:  Past Medical History:  Diagnosis Date  . Anemia   . Anxiety   . Arthritis   . Cervical cancer, FIGO stage IVB [facility]) oncologist--- dr gorsuch/ dr Sondra Come   dx inital dx 09/ 2021, w/ mets to bone, started chemo 04-30-2020 and stopped 05-21-2020 due to worsening pancytopenia;  completed IMRT 06-16-2020 and scheduled to start brachytherapy boost high dose 06-21-2020  . Chemotherapy-induced nausea   . Cutaneous lupus erythematosus    followed by [facility]-- Marella Chimes PA  . Dyslipidemia   . Family history of breast cancer   . Family history of uterine cancer   . GERD (gastroesophageal reflux disease)   . Hemorrhoids   .  History of 2019 novel coronavirus disease (COVID-19) 06/20/2019   [facility] , had mild symptoms that  resolved  . History of colon polyps 04/06/2020   hyerplastic  . History of external beam radiation therapy 05-03-2020  to 06-16-2020   cervical cancer  . History of gastric polyp 04/06/2020   hyperplastic  . History of gastric ulcer   . Pancytopenia due to chemotherapy ([place])   . Wears glasses     PAST SURGICAL HISTORY: Past Surgical History:  Procedure Laterality Date  . CATARACT EXTRACTION W/ INTRAOCULAR LENS IMPLANT Left 12/2019  . COLONOSCOPY WITH ESOPHAGOGASTRODUODENOSCOPY (EGD)  04-06-2020  dr Raliegh Ip. [facility]  . ENDOMETRIAL ABLATION W/ NOVASURE  2010  . IR IMAGING GUIDED PORT INSERTION  04/26/2020  . OPERATIVE ULTRASOUND N/A 06/21/2020   Procedure: OPERATIVE ULTRASOUND;  Surgeon: Gery Pray, MD;  Location: [facility];  Service: Urology;  Laterality: N/A;  TECH  . TANDEM RING INSERTION N/A 06/21/2020   Procedure: TANDEM RING INSERTION;  Surgeon: Gery Pray, MD;  Location: [facility];  Service: Urology;  Laterality: N/A;    FAMILY HISTORY:  Family History  Problem Relation Age of Onset  . Hypertension Mother   . Emphysema Mother   . Uterine cancer Mother 44  . Hypertension Father   . Stroke Father   . Hypertension Brother   . Healthy Daughter   . Breast cancer Maternal Aunt        d. 52  . Leukemia Cousin        d. 49s    SOCIAL HISTORY:  Social History   Tobacco Use  . Smoking status: Never Smoker  . Smokeless tobacco: Never Used  Vaping Use  . Vaping Use: Never used  Substance Use Topics  . Alcohol use: No  . Drug use: Never    ALLERGIES:  Allergies  Allergen Reactions  . Penicillins Rash    MEDICATIONS:  Current Facility-Administered Medications  Medication Dose Route Frequency Provider Last Rate Last Admin  . lactated ringers infusion   Intravenous Continuous Belinda Block, MD 50 mL/hr at 07/01/20 0745 New Bag at 07/01/20 0745  . povidone-iodine 10 % swab 2 application  2 application Topical Once Gery Pray, MD         REVIEW OF SYSTEMS:  A 10+ POINT REVIEW OF SYSTEMS WAS OBTAINED including neurology, dermatology, psychiatry, cardiac, respiratory, lymph, extremities, GI, GU, musculoskeletal, constitutional, reproductive, HEENT.  The patient denies any significant pelvic pain.  She has noticed some discomfort since her PET scan primarily in the upper buttocks/sacrum area.  She denies any significant vaginal bleeding at this time.   PHYSICAL EXAM:  height is 5\' 5"  (1.651 m) and weight is 208 lb 12.8 oz (94.7 kg). Her oral temperature is 98 F (36.7 C). Her blood pressure is 133/77 and her pulse is 71. Her respiration is 17 and oxygen saturation is 99%.   General: Alert and oriented, in no acute distress HEENT: Head is normocephalic. Extraocular movements are intact. Oropharynx is clear. Neck: Neck is supple, no palpable cervical or supraclavicular lymphadenopathy. Heart: Regular in rate and rhythm with no murmurs, rubs, or gallops. Chest: Clear to auscultation bilaterally, with no rhonchi, wheezes, or rales. Abdomen: Soft, nontender, nondistended, with no rigidity or guarding. Extremities: No cyanosis or edema. Lymphatics: see Neck Exam Skin: No concerning lesions. Musculoskeletal: symmetric strength and muscle tone throughout. Neurologic: Cranial nerves II through XII are grossly intact. No obvious focalities. Speech is fluent. Coordination is  intact. Psychiatric: Judgment and insight are intact. Affect is appropriate. On pelvic exam the cervix is replaced by an approximately 4 to 5 cm hard mass.  This mass bleeds easily with examination.  On rectovaginal examination there appears to be bilateral parametrial involvement more so along the right side.  No obvious sidewall involvement  ECOG = 1   LABORATORY DATA:  Lab Results  Component Value Date   WBC 5.4 06/24/2020   HGB 10.0 (L) 06/24/2020   HCT 29.9 (L) 06/24/2020   MCV 89.8 06/24/2020   PLT 178 06/24/2020   NEUTROABS 3.8 06/24/2020   Lab  Results  Component Value Date   NA 140 06/24/2020   K 3.4 (L) 06/24/2020   CL 105 06/24/2020   CO2 25 06/24/2020   GLUCOSE 106 (H) 06/24/2020   CREATININE 0.99 06/24/2020   CALCIUM 9.8 06/24/2020       IMPRESSION: Stage IVB (cT3b, cN1a, cM1) adenocarcinoma of the cervix  The patient has an isolated metastatic deposit in the right ischial region.  I discussed this issue with Dr. Denman George and Dr. Alvy Bimler.  Given the patient's excellent performance status and minimal metastatic disease we will proceed with an aggressive course of radiation including external beam radiation therapy directed at the pelvis which will encompass the sclerotic metastasis in the right pelvis region.  This will also be accompanied by radiosensitizing weekly cisplatin-based radiosensitizing chemotherapy.  Patient will then proceed with brachytherapy.  Her case will be discussed at the multidisciplinary oncologic oncology conference early next week for further input.  This will address the anticipated number of brachytherapy procedures do prior to proceeding with  full dose chemotherapy.  Patient has recently completed her external beam and radiosensitizing chemotherapy.  Due to low blood counts some of her radiosensitizing chemotherapy sessions were cancelled.  Recent blood work shows the patient is recovering with her blood work and is now ready to proceed with her brachytherapy session.  The patient is to receive a total of 5 intracavitary brachytherapy treatments directed at the cervix using iridium 192 is the high-dose-rate source    PLAN: The patient will be taken to the operating room on January 13 at 9:15 for her second brachytherapy procedure. She will have exam under anesthesia,  dilation of the cervical os followed by placement of a tandem ring apparatus in preparation for high-dose-rate treatment.  ------------------------------------------------  Blair Promise, PhD, MD

## 2020-07-01 NOTE — Transfer of Care (Signed)
Immediate Anesthesia Transfer of Care Note  Patient: Phyllis Hebert  Procedure(s) Performed: TANDEM RING INSERTION (N/A ) OPERATIVE ULTRASOUND (N/A )  Patient Location: PACU  Anesthesia Type:General  Level of Consciousness: drowsy and patient cooperative  Airway & Oxygen Therapy: Patient Spontanous Breathing and Patient connected to face mask oxygen  Post-op Assessment: Report given to RN and Post -op Vital signs reviewed and stable  Post vital signs: Reviewed and stable  Last Vitals:  Vitals Value Taken Time  BP 133/81 07/01/20 0928  Temp    Pulse 71 07/01/20 0929  Resp 13 07/01/20 0929  SpO2 100 % 07/01/20 0929  Vitals shown include unvalidated device data.  Last Pain:  Vitals:   07/01/20 0743  TempSrc: Oral  PainSc: 0-No pain      Patients Stated Pain Goal: 5 (58/59/29 2446)  Complications: No complications documented.

## 2020-07-01 NOTE — Anesthesia Procedure Notes (Signed)
Procedure Name: LMA Insertion Date/Time: 07/01/2020 8:36 AM Performed by: Genelle Bal, CRNA Pre-anesthesia Checklist: Patient identified, Emergency Drugs available, Suction available and Patient being monitored Patient Re-evaluated:Patient Re-evaluated prior to induction Oxygen Delivery Method: Circle system utilized Preoxygenation: Pre-oxygenation with 100% oxygen Induction Type: IV induction Ventilation: Mask ventilation without difficulty LMA: LMA inserted LMA Size: 4.0 Number of attempts: 1 Airway Equipment and Method: Bite block Placement Confirmation: positive ETCO2 Tube secured with: Tape Dental Injury: Teeth and Oropharynx as per pre-operative assessment

## 2020-07-02 ENCOUNTER — Encounter (HOSPITAL_BASED_OUTPATIENT_CLINIC_OR_DEPARTMENT_OTHER): Payer: Self-pay | Admitting: Radiation Oncology

## 2020-07-02 ENCOUNTER — Other Ambulatory Visit: Payer: Self-pay

## 2020-07-02 NOTE — Progress Notes (Signed)
Spoke w/ via phone for pre-op interview--- PT Lab needs dos---- no (per anes)/ pre-op orders pending            Lab results------ no COVID test ------ 07-03-2020 @ 1215 Arrive at ------- 0800 on 07-06-2020 NPO after MN NO Solid Food.  Clear liquids from MN until--- 0700 Medications to take morning of surgery ----- Protonix Diabetic medication ----- n/a Patient Special Instructions ----- n/a Pre-Op special Istructions ----- called and left message w/ Phyllis Hebert requested pre-op orders from dr Sondra Come Patient verbalized understanding of instructions that were given at this phone interview. Patient denies shortness of breath, chest pain, fever, cough at this phone interview.

## 2020-07-03 ENCOUNTER — Other Ambulatory Visit (HOSPITAL_COMMUNITY)
Admission: RE | Admit: 2020-07-03 | Discharge: 2020-07-03 | Disposition: A | Discharge status: Home / Self Care | Payer: Managed Care, Other (non HMO) | Source: Ambulatory Visit | Attending: Radiation Oncology | Admitting: Radiation Oncology

## 2020-07-03 DIAGNOSIS — Z20822 Contact with and (suspected) exposure to covid-19: Secondary | ICD-10-CM | POA: Insufficient documentation

## 2020-07-03 DIAGNOSIS — Z01812 Encounter for preprocedural laboratory examination: Secondary | ICD-10-CM | POA: Insufficient documentation

## 2020-07-03 LAB — SARS CORONAVIRUS 2 (TAT 6-24 HRS): SARS Coronavirus 2: NEGATIVE

## 2020-07-05 ENCOUNTER — Telehealth: Payer: Self-pay | Admitting: *Deleted

## 2020-07-05 NOTE — Telephone Encounter (Signed)
CALLED PATIENT TO ASK DOING CASE EARLIER TOMORROW @ 8:15 AM, PATIENT AGREED TO DO SO

## 2020-07-06 ENCOUNTER — Ambulatory Visit (HOSPITAL_BASED_OUTPATIENT_CLINIC_OR_DEPARTMENT_OTHER): Payer: Managed Care, Other (non HMO) | Admitting: Certified Registered"

## 2020-07-06 ENCOUNTER — Ambulatory Visit (HOSPITAL_BASED_OUTPATIENT_CLINIC_OR_DEPARTMENT_OTHER)
Admission: RE | Admit: 2020-07-06 | Discharge: 2020-07-06 | Disposition: A | Discharge status: Other Acute Inpatient Hospital | Payer: Managed Care, Other (non HMO) | Attending: Radiation Oncology | Admitting: Radiation Oncology

## 2020-07-06 ENCOUNTER — Ambulatory Visit
Admission: RE | Admit: 2020-07-06 | Discharge: 2020-07-06 | Disposition: A | Discharge status: Home / Self Care | Payer: Managed Care, Other (non HMO) | Source: Ambulatory Visit | Attending: Radiation Oncology | Admitting: Radiation Oncology

## 2020-07-06 ENCOUNTER — Ambulatory Visit (HOSPITAL_COMMUNITY)
Admission: RE | Admit: 2020-07-06 | Discharge: 2020-07-06 | Disposition: A | Discharge status: Home / Self Care | Payer: Managed Care, Other (non HMO) | Source: Ambulatory Visit | Attending: Radiation Oncology | Admitting: Radiation Oncology

## 2020-07-06 ENCOUNTER — Encounter (HOSPITAL_BASED_OUTPATIENT_CLINIC_OR_DEPARTMENT_OTHER): Payer: Self-pay | Admitting: Radiation Oncology

## 2020-07-06 ENCOUNTER — Encounter (HOSPITAL_BASED_OUTPATIENT_CLINIC_OR_DEPARTMENT_OTHER)
Admission: RE | Disposition: A | Discharge status: Other Acute Inpatient Hospital | Payer: Self-pay | Source: Home / Self Care | Attending: Radiation Oncology

## 2020-07-06 ENCOUNTER — Other Ambulatory Visit: Payer: Self-pay

## 2020-07-06 VITALS — BP 144/68 | HR 70 | Resp 16

## 2020-07-06 DIAGNOSIS — Z9221 Personal history of antineoplastic chemotherapy: Secondary | ICD-10-CM | POA: Diagnosis not present

## 2020-07-06 DIAGNOSIS — C539 Malignant neoplasm of cervix uteri, unspecified: Secondary | ICD-10-CM | POA: Diagnosis not present

## 2020-07-06 DIAGNOSIS — Z803 Family history of malignant neoplasm of breast: Secondary | ICD-10-CM | POA: Insufficient documentation

## 2020-07-06 DIAGNOSIS — Z8616 Personal history of COVID-19: Secondary | ICD-10-CM | POA: Diagnosis not present

## 2020-07-06 DIAGNOSIS — C7951 Secondary malignant neoplasm of bone: Secondary | ICD-10-CM | POA: Insufficient documentation

## 2020-07-06 DIAGNOSIS — Z8049 Family history of malignant neoplasm of other genital organs: Secondary | ICD-10-CM | POA: Insufficient documentation

## 2020-07-06 DIAGNOSIS — Z923 Personal history of irradiation: Secondary | ICD-10-CM | POA: Diagnosis not present

## 2020-07-06 HISTORY — PX: TANDEM RING INSERTION: SHX6199

## 2020-07-06 HISTORY — PX: OPERATIVE ULTRASOUND: SHX5996

## 2020-07-06 SURGERY — INSERTION, UTERINE TANDEM AND RING OR CYLINDER, FOR BRACHYTHERAPY
Anesthesia: General | Site: Cervix

## 2020-07-06 MED ORDER — FENTANYL CITRATE (PF) 250 MCG/5ML IJ SOLN
INTRAMUSCULAR | Status: AC
  Filled 2020-07-06: qty 5

## 2020-07-06 MED ORDER — ONDANSETRON HCL 4 MG/2ML IJ SOLN
INTRAMUSCULAR | Status: AC
  Filled 2020-07-06: qty 2

## 2020-07-06 MED ORDER — LIDOCAINE HCL (PF) 2 % IJ SOLN
INTRAMUSCULAR | Status: AC
  Filled 2020-07-06: qty 5

## 2020-07-06 MED ORDER — FENTANYL CITRATE (PF) 100 MCG/2ML IJ SOLN
INTRAMUSCULAR | Status: AC
  Filled 2020-07-06: qty 2

## 2020-07-06 MED ORDER — POVIDONE-IODINE 10 % EX SWAB: 2.0000 "application " | Freq: Once | CUTANEOUS | Status: DC

## 2020-07-06 MED ORDER — 0.9 % SODIUM CHLORIDE (POUR BTL) OPTIME
TOPICAL | Status: DC | PRN
Start: 2020-07-06 — End: 2020-07-06
  Administered 2020-07-06: 500 mL

## 2020-07-06 MED ORDER — ONDANSETRON HCL 4 MG/2ML IJ SOLN
INTRAMUSCULAR | Status: DC | PRN
  Administered 2020-07-06: 4 mg via INTRAVENOUS

## 2020-07-06 MED ORDER — FENTANYL CITRATE (PF) 100 MCG/2ML IJ SOLN
25.0000 ug | INTRAMUSCULAR | Status: DC | PRN
  Administered 2020-07-06 (×2): 25 ug via INTRAVENOUS

## 2020-07-06 MED ORDER — AMISULPRIDE (ANTIEMETIC) 5 MG/2ML IV SOLN: 10.0000 mg | Freq: Once | INTRAVENOUS | Status: DC | PRN

## 2020-07-06 MED ORDER — SODIUM CHLORIDE 0.9 % IR SOLN
Status: DC | PRN
  Administered 2020-07-06: 1000 mL

## 2020-07-06 MED ORDER — MIDAZOLAM HCL 5 MG/5ML IJ SOLN
INTRAMUSCULAR | Status: DC | PRN
  Administered 2020-07-06: 2 mg via INTRAVENOUS

## 2020-07-06 MED ORDER — DEXAMETHASONE SODIUM PHOSPHATE 10 MG/ML IJ SOLN
INTRAMUSCULAR | Status: DC | PRN
  Administered 2020-07-06: 10 mg via INTRAVENOUS

## 2020-07-06 MED ORDER — FENTANYL CITRATE (PF) 100 MCG/2ML IJ SOLN
INTRAMUSCULAR | Status: DC | PRN
  Administered 2020-07-06 (×2): 50 ug via INTRAVENOUS

## 2020-07-06 MED ORDER — ACETAMINOPHEN 500 MG PO TABS
ORAL_TABLET | ORAL | Status: AC
  Filled 2020-07-06: qty 2

## 2020-07-06 MED ORDER — PROPOFOL 10 MG/ML IV BOLUS
INTRAVENOUS | Status: DC | PRN
  Administered 2020-07-06: 150 mg via INTRAVENOUS

## 2020-07-06 MED ORDER — EPHEDRINE SULFATE-NACL 50-0.9 MG/10ML-% IV SOSY
PREFILLED_SYRINGE | INTRAVENOUS | Status: DC | PRN
  Administered 2020-07-06 (×2): 5 mg via INTRAVENOUS

## 2020-07-06 MED ORDER — PROPOFOL 10 MG/ML IV BOLUS
INTRAVENOUS | Status: AC
  Filled 2020-07-06: qty 20

## 2020-07-06 MED ORDER — CELECOXIB 200 MG PO CAPS
200.0000 mg | ORAL_CAPSULE | Freq: Once | ORAL | Status: AC
  Administered 2020-07-06: 200 mg via ORAL

## 2020-07-06 MED ORDER — ACETAMINOPHEN 500 MG PO TABS
1000.0000 mg | ORAL_TABLET | Freq: Once | ORAL | Status: AC
  Administered 2020-07-06: 1000 mg via ORAL

## 2020-07-06 MED ORDER — EPHEDRINE 5 MG/ML INJ
INTRAVENOUS | Status: AC
  Filled 2020-07-06: qty 10

## 2020-07-06 MED ORDER — MIDAZOLAM HCL 2 MG/2ML IJ SOLN
INTRAMUSCULAR | Status: AC
  Filled 2020-07-06: qty 2

## 2020-07-06 MED ORDER — CELECOXIB 200 MG PO CAPS
ORAL_CAPSULE | ORAL | Status: AC
  Filled 2020-07-06: qty 1

## 2020-07-06 MED ORDER — LIDOCAINE 2% (20 MG/ML) 5 ML SYRINGE
INTRAMUSCULAR | Status: DC | PRN
  Administered 2020-07-06: 100 mg via INTRAVENOUS

## 2020-07-06 MED ORDER — DEXAMETHASONE SODIUM PHOSPHATE 10 MG/ML IJ SOLN
INTRAMUSCULAR | Status: AC
  Filled 2020-07-06: qty 1

## 2020-07-06 MED ORDER — LACTATED RINGERS IV SOLN: INTRAVENOUS | Status: DC

## 2020-07-06 SURGICAL SUPPLY — 19 items
BNDG CONFORM 2 STRL LF (GAUZE/BANDAGES/DRESSINGS) IMPLANT
COVER WAND RF STERILE (DRAPES) ×3 IMPLANT
DILATOR CANAL MILEX (MISCELLANEOUS) IMPLANT
DRSG PAD ABDOMINAL 8X10 ST (GAUZE/BANDAGES/DRESSINGS) ×3 IMPLANT
GAUZE 4X4 16PLY RFD (DISPOSABLE) ×3 IMPLANT
GLOVE BIO SURGEON STRL SZ7.5 (GLOVE) ×6 IMPLANT
GOWN STRL REUS W/TWL LRG LVL3 (GOWN DISPOSABLE) ×3 IMPLANT
HOLDER FOLEY CATH W/STRAP (MISCELLANEOUS) ×3 IMPLANT
IV NS 1000ML (IV SOLUTION) ×3
IV NS 1000ML BAXH (IV SOLUTION) ×2 IMPLANT
IV SET EXTENSION GRAVITY 40 LF (IV SETS) ×3 IMPLANT
KIT TURNOVER CYSTO (KITS) ×3 IMPLANT
MAT PREVALON FULL STRYKER (MISCELLANEOUS) ×3 IMPLANT
PACK VAGINAL MINOR WOMEN LF (CUSTOM PROCEDURE TRAY) ×3 IMPLANT
PACKING VAGINAL (PACKING) IMPLANT
PAD OB MATERNITY 4.3X12.25 (PERSONAL CARE ITEMS) IMPLANT
TOWEL OR 17X26 10 PK STRL BLUE (TOWEL DISPOSABLE) ×3 IMPLANT
TRAY FOLEY W/BAG SLVR 14FR LF (SET/KITS/TRAYS/PACK) ×3 IMPLANT
WATER STERILE IRR 500ML POUR (IV SOLUTION) ×3 IMPLANT

## 2020-07-06 NOTE — Anesthesia Preprocedure Evaluation (Signed)
Anesthesia Evaluation  Patient identified by MRN, date of birth, ID band Patient awake    Reviewed: Allergy & Precautions, H&P , NPO status , Patient's Chart, lab work & pertinent test results  Airway Mallampati: II  TM Distance: >3 FB Neck ROM: Full    Dental no notable dental hx.    Pulmonary neg pulmonary ROS,    Pulmonary exam normal breath sounds clear to auscultation       Cardiovascular negative cardio ROS Normal cardiovascular exam Rhythm:Regular Rate:Normal     Neuro/Psych negative neurological ROS  negative psych ROS   GI/Hepatic Neg liver ROS, GERD  ,  Endo/Other  negative endocrine ROS  Renal/GU negative Renal ROS  negative genitourinary   Musculoskeletal negative musculoskeletal ROS (+)   Abdominal   Peds negative pediatric ROS (+)  Hematology  (+) anemia ,   Anesthesia Other Findings   Reproductive/Obstetrics negative OB ROS                             Anesthesia Physical  Anesthesia Plan  ASA: III  Anesthesia Plan: General   Post-op Pain Management:    Induction: Intravenous  PONV Risk Score and Plan: 3 and Ondansetron, Dexamethasone and Treatment may vary due to age or medical condition  Airway Management Planned: LMA  Additional Equipment:   Intra-op Plan:   Post-operative Plan: Extubation in OR  Informed Consent: I have reviewed the patients History and Physical, chart, labs and discussed the procedure including the risks, benefits and alternatives for the proposed anesthesia with the patient or authorized representative who has indicated his/her understanding and acceptance.     Dental advisory given  Plan Discussed with: Anesthesiologist  Anesthesia Plan Comments:         Anesthesia Quick Evaluation

## 2020-07-06 NOTE — Progress Notes (Signed)
  Radiation Oncology         [phone] ________________________________  Name: Phyllis Hebert MRN: [id number]  Date: 07/06/2020  DOB: 04-27-1956  CC: [name], MD  [name], MD  HDR BRACHYTHERAPY NOTE  DIAGNOSIS: Stage IVB (cT3b, cN1a, cM1) adenocarcinoma of the cervix  Simple treatment device note: In the operating room the patient had construction of her custom tandem ring system. She will be treated with a 60 tandem/ring system with a 6.0 cm  tandem. This conforms to her anatomy without undue discomfort. A rectal paddle was also part of her custom set up device.  Vaginal brachytherapy procedure node: The patient was brought to the [facility] suite. Identity was confirmed. All relevant records and images related to the planned course of therapy were reviewed. The patient freely provided informed written consent to proceed with treatment after reviewing the details related to the planned course of therapy. The consent form was witnessed and verified by the simulation staff. Then, the patient was set-up in a stable reproducible supine position for radiation therapy. The tandem cylinder system was accessed and fiducial markers were placed within the tandem and cylinder.   Verification simulation note: An AP and lateral film was obtained through the pelvis area. This was compared to the patient's planning films documenting accurate position of the tandem/cylinder system for treatment.  High-dose-rate brachytherapy treatment note:   The remote afterloading device was accessed through catheter system and attached to the tandem ring system. Patient then proceeded to undergo her third high-dose-rate treatment directed at the cervix. The patient was prescribed a dose of 5.5 gray to be delivered to the high risk clinical target volume.  Patient was treated with 2 channels using 25 dwell positions. Treatment time was 326.2 seconds. The patient tolerated the procedure well. After  completion of her therapy, a radiation survey was performed documenting return of the iridium source into the [employer] safe. The patient was then transferred to the nursing suite.  She then had removal of the rectal paddle followed by the tandem ring system. The patient tolerated the removal well. No bleeding noted. Cervical sleeve remained in place.   PLAN: The patient will return next week for her fourth high-dose-rate treatment. ________________________________     Blair Promise, PhD, MD  This document serves as a record of services personally performed by Gery Pray, MD. It was created on his behalf by Clerance Lav, a trained medical scribe. The creation of this record is based on the scribe's personal observations and the provider's statements to them. This document has been checked and approved by the attending provider.

## 2020-07-06 NOTE — Anesthesia Procedure Notes (Signed)
Procedure Name: LMA Insertion Date/Time: 07/06/2020 8:35 AM Performed by: Gwyndolyn Saxon, CRNA Pre-anesthesia Checklist: Patient identified, Emergency Drugs available, Suction available and Patient being monitored Patient Re-evaluated:Patient Re-evaluated prior to induction Oxygen Delivery Method: Circle system utilized Preoxygenation: Pre-oxygenation with 100% oxygen Induction Type: IV induction Ventilation: Mask ventilation without difficulty LMA: LMA inserted LMA Size: 4.0 Number of attempts: 1 Airway Equipment and Method: Patient positioned with wedge pillow Placement Confirmation: positive ETCO2 and breath sounds checked- equal and bilateral Tube secured with: Tape Dental Injury: Teeth and Oropharynx as per pre-operative assessment

## 2020-07-06 NOTE — Anesthesia Postprocedure Evaluation (Signed)
Anesthesia Post Note  Patient: Phyllis Hebert  Procedure(s) Performed: TANDEM RING INSERTION (N/A Cervix) OPERATIVE ULTRASOUND (N/A Abdomen)     Patient location during evaluation: PACU Anesthesia Type: General Level of consciousness: sedated Pain management: pain level controlled Vital Signs Assessment: post-procedure vital signs reviewed and stable Respiratory status: spontaneous breathing and respiratory function stable Cardiovascular status: stable Postop Assessment: no apparent nausea or vomiting Anesthetic complications: no   No complications documented.  Last Vitals:  Vitals:   07/06/20 0940 07/06/20 0945  BP:  (!) 155/80  Pulse: 70 71  Resp: 14 14  Temp:    SpO2: 97% 97%    Last Pain:  Vitals:   07/06/20 0945  TempSrc:   PainSc: 3                  Emmah Bratcher DANIEL

## 2020-07-06 NOTE — Interval H&P Note (Signed)
History and Physical Interval Note:  07/06/2020 8:20 AM  Phyllis Hebert  has presented today for surgery, with the diagnosis of CERVICAL CANCER.  The various methods of treatment have been discussed with the patient and family. After consideration of risks, benefits and other options for treatment, the patient has consented to  Procedure(s) with comments: TANDEM RING INSERTION (N/A) OPERATIVE ULTRASOUND (N/A) - NEED TECH AND ULTRASOUND TO ARRIVE AT 8AM as a surgical intervention.  The patient's history has been reviewed, patient examined, no change in status, stable for surgery.  I have reviewed the patient's chart and labs.  Questions were answered to the patient's satisfaction.     Gery Pray

## 2020-07-06 NOTE — Progress Notes (Signed)
Patient tolerated Tandem and Ring procedure and HDR treatment.  Did not require any additional pain medication.  She left with all of her belongings via wheelchair with family to pick her up and be present with her all day.

## 2020-07-06 NOTE — Op Note (Signed)
07/06/2020  9:33 AM  PATIENT:  Phyllis Hebert  65 y.o. female  PRE-OPERATIVE DIAGNOSIS:  CERVICAL CANCER  POST-OPERATIVE DIAGNOSIS:  CERVICAL CANCER  PROCEDURE:  Procedure(s) with comments: TANDEM RING INSERTION (N/A) OPERATIVE ULTRASOUND (N/A) - NEED TECH AND ULTRASOUND TO ARRIVE AT 8AM  SURGEON:  Surgeon(s) and Role:    * Gery Pray, MD - Primary  PHYSICIAN ASSISTANT:   ASSISTANTS: none   ANESTHESIA:   general  EBL: 0 mL  BLOOD ADMINISTERED:none  DRAINS: Urinary Catheter (Foley)   LOCAL MEDICATIONS USED:  NONE  SPECIMEN:  No Specimen  DISPOSITION OF SPECIMEN:  N/A  COUNTS:  YES  TOURNIQUET:  * No tourniquets in log *  DICTATION: Patient was taken to outpatient OR #4. Timeout was performed for the estimated length of procedure, procedure, preoperative antibiotics, allergies. Patient was prepped and draped in the usual sterile fashion and placed in the dorsolithotomy position. A Foley catheter was placed without difficulty. Clear yellowish urine was obtained. Patient then underwent exam under anesthesia,based on intraoperative exam and ultrasound findings the cervical mass was approximately 3.5 x 2.6 cm, a significant decrease in size compared to pretreatment. The cervical mass however continue to be quite firm with firm induration throughout the cervix.  Since her first and second brachytherapy procedure the cervical mass seem to be what softer on exam.The cervical os was flush with the upper vaginal area and firm nodularity was palpated along the region of the cervix.  The cervical sleeve placed last week remained in good position.  Initial ultrasound imaging after instillation of approximately 250 cc of sterile water in the bladder were excellent.  the patient then had placement of a  60 mm, 60 degree tandem within the endometrial cavity and cervical region. Attached to this device was a 60 degree cervical ring with a small shielding In place. The patient then  had placement of a rectal paddle posteriorly to limit dose to the rectal mucosa.  Ultrasound at completion of placement of the tandem ring showed excellent position within the central uterus and endocervical canal. ThePatient tolerated the procedure well was transferred for to the recovery room in stable condition. Later in the day the patient will be transferred to radiation oncology for simulation planning and her third high-dose-rate treatment. Patient is to receive a total of 5 high-dose-rate treatments with estimated dose per treatment to the high risk clinical target volume of 5.5 Gy. Iridium 192 will be the high-dose-rate source.  PLAN OF CARE: Transferred to radiation oncology for planning and treatment  PATIENT DISPOSITION:  PACU - hemodynamically stable.   Delay start of Pharmacological VTE agent (>24hrs) due to surgical blood loss or risk of bleeding: not applicable

## 2020-07-06 NOTE — Patient Instructions (Signed)
IMMEDIATELY FOLLOWING SURGERY: Do not drive or operate machinery for the first twenty four hours after surgery. Do not make any important decisions for twenty four hours after surgery or while taking narcotic pain medications or sedatives. If you develop intractable nausea and vomiting or a severe headache please notify your doctor immediately.   FOLLOW-UP: You do not need to follow up with anesthesia unless specifically instructed to do so.   WOUND CARE INSTRUCTIONS (if applicable): Expect some mild vaginal bleeding, but if large amount of bleeding occurs please contact Dr. Sondra Come at [phone] or the Radiation On-Call physician. Call for any fever greater than 101.0 degrees or increasing vaginal//abdominal pain or trouble urinating.   QUESTIONS?: Please feel free to call your physician or the hospital operator if you have any questions, and they will be happy to assist you. Resume all medications: as listed on your after visit summary. Your next appointment is:  Future Appointments  Date Time Provider [place]  07/06/2020  1:00 PM Gery Pray, MD [facility] None  07/13/2020  9:45 AM [facility] None  07/13/2020 10:00 AM [facility] [place] [facility] None  07/13/2020 10:20 AM [name], MD [facility] None  07/14/2020  7:30 AM [facility] 2 [facility] [place]  07/14/2020 10:00 AM Gery Pray, MD [facility] None  07/14/2020  1:30 PM Gery Pray, MD [place] None  07/19/2020  7:00 AM [facility] 1 [facility] [place]  07/19/2020 10:00 AM Gery Pray, MD [facility] None  07/19/2020  1:00 PM Gery Pray, MD [facility] None

## 2020-07-06 NOTE — Transfer of Care (Signed)
Immediate Anesthesia Transfer of Care Note  Patient: Phyllis Hebert  Procedure(s) Performed: TANDEM RING INSERTION (N/A Cervix) OPERATIVE ULTRASOUND (N/A Abdomen)  Patient Location: PACU  Anesthesia Type:General  Level of Consciousness: drowsy  Airway & Oxygen Therapy: Patient Spontanous Breathing and Patient connected to face mask oxygen  Post-op Assessment: Report given to RN and Post -op Vital signs reviewed and stable  Post vital signs: Reviewed and stable  Last Vitals:  Vitals Value Taken Time  BP 117/75 07/06/20 0908  Temp 36.3 C 07/06/20 0908  Pulse 81 07/06/20 0914  Resp 23 07/06/20 0914  SpO2 100 % 07/06/20 0914  Vitals shown include unvalidated device data.  Last Pain:  Vitals:   07/06/20 0631  TempSrc: Oral  PainSc: 0-No pain      Patients Stated Pain Goal: 5 (16/60/60 0459)  Complications: No complications documented.

## 2020-07-06 NOTE — Discharge Instructions (Signed)

## 2020-07-07 ENCOUNTER — Encounter (HOSPITAL_BASED_OUTPATIENT_CLINIC_OR_DEPARTMENT_OTHER): Payer: Self-pay | Admitting: Radiation Oncology

## 2020-07-12 ENCOUNTER — Other Ambulatory Visit: Payer: Self-pay

## 2020-07-12 ENCOUNTER — Other Ambulatory Visit (HOSPITAL_COMMUNITY)
Admission: RE | Admit: 2020-07-12 | Discharge: 2020-07-12 | Disposition: A | Discharge status: Home / Self Care | Payer: Managed Care, Other (non HMO) | Source: Ambulatory Visit | Attending: Radiation Oncology | Admitting: Radiation Oncology

## 2020-07-12 ENCOUNTER — Encounter (HOSPITAL_BASED_OUTPATIENT_CLINIC_OR_DEPARTMENT_OTHER): Payer: Self-pay | Admitting: Radiation Oncology

## 2020-07-12 DIAGNOSIS — Z20822 Contact with and (suspected) exposure to covid-19: Secondary | ICD-10-CM | POA: Diagnosis not present

## 2020-07-12 DIAGNOSIS — Z01812 Encounter for preprocedural laboratory examination: Secondary | ICD-10-CM | POA: Diagnosis present

## 2020-07-12 LAB — SARS CORONAVIRUS 2 (TAT 6-24 HRS): SARS Coronavirus 2: NEGATIVE

## 2020-07-12 NOTE — Progress Notes (Addendum)
Spoke w/ via phone for pre-op interview--- PT Lab needs dos---- no            Lab results------ no COVID test ------ 07-12-2020 @ 9150 Arrive at ------- 0530 on 07-14-2020 NPO after MN NO Solid Food.  Clear liquids from MN until--- 0430 Medications to take morning of surgery ----- Protonix Diabetic medication ----- n/a Patient Special Instructions ----- n/a Pre-Op special Istructions ----- n/a Patient verbalized understanding of instructions that were given at this phone interview. Patient denies shortness of breath, chest pain, fever, cough at this phone interview.

## 2020-07-13 ENCOUNTER — Other Ambulatory Visit: Payer: Self-pay

## 2020-07-13 ENCOUNTER — Inpatient Hospital Stay: Payer: Managed Care, Other (non HMO)

## 2020-07-13 ENCOUNTER — Encounter: Payer: Self-pay | Admitting: Hematology and Oncology

## 2020-07-13 ENCOUNTER — Inpatient Hospital Stay (HOSPITAL_BASED_OUTPATIENT_CLINIC_OR_DEPARTMENT_OTHER): Payer: Managed Care, Other (non HMO) | Admitting: Hematology and Oncology

## 2020-07-13 ENCOUNTER — Other Ambulatory Visit: Payer: Self-pay | Admitting: Hematology and Oncology

## 2020-07-13 VITALS — BP 136/85 | HR 74 | Temp 98.3°F | Resp 18 | Ht 65.0 in | Wt 211.4 lb

## 2020-07-13 DIAGNOSIS — E039 Hypothyroidism, unspecified: Secondary | ICD-10-CM | POA: Insufficient documentation

## 2020-07-13 DIAGNOSIS — N1330 Unspecified hydronephrosis: Secondary | ICD-10-CM | POA: Diagnosis not present

## 2020-07-13 DIAGNOSIS — C539 Malignant neoplasm of cervix uteri, unspecified: Secondary | ICD-10-CM

## 2020-07-13 DIAGNOSIS — M329 Systemic lupus erythematosus, unspecified: Secondary | ICD-10-CM | POA: Diagnosis not present

## 2020-07-13 DIAGNOSIS — C7951 Secondary malignant neoplasm of bone: Secondary | ICD-10-CM

## 2020-07-13 LAB — CBC WITH DIFFERENTIAL/PLATELET
Abs Immature Granulocytes: 0.06 10*3/uL (ref 0.00–0.07)
Basophils Absolute: 0 10*3/uL (ref 0.0–0.1)
Basophils Relative: 0 %
Eosinophils Absolute: 0.1 10*3/uL (ref 0.0–0.5)
Eosinophils Relative: 1 %
HCT: 30.7 % — ABNORMAL LOW (ref 36.0–46.0)
Hemoglobin: 10.5 g/dL — ABNORMAL LOW (ref 12.0–15.0)
Immature Granulocytes: 1 %
Lymphocytes Relative: 10 %
Lymphs Abs: 0.6 10*3/uL — ABNORMAL LOW (ref 0.7–4.0)
MCH: 31.4 pg (ref 26.0–34.0)
MCHC: 34.2 g/dL (ref 30.0–36.0)
MCV: 91.9 fL (ref 80.0–100.0)
Monocytes Absolute: 0.5 10*3/uL (ref 0.1–1.0)
Monocytes Relative: 10 %
Neutro Abs: 4.2 10*3/uL (ref 1.7–7.7)
Neutrophils Relative %: 78 %
Platelets: 159 10*3/uL (ref 150–400)
RBC: 3.34 MIL/uL — ABNORMAL LOW (ref 3.87–5.11)
RDW: 18.9 % — ABNORMAL HIGH (ref 11.5–15.5)
WBC: 5.4 10*3/uL (ref 4.0–10.5)
nRBC: 0 % (ref 0.0–0.2)

## 2020-07-13 LAB — COMPREHENSIVE METABOLIC PANEL
ALT: 14 U/L (ref 0–44)
AST: 11 U/L — ABNORMAL LOW (ref 15–41)
Albumin: 3.6 g/dL (ref 3.5–5.0)
Alkaline Phosphatase: 60 U/L (ref 38–126)
Anion gap: 8 (ref 5–15)
BUN: 17 mg/dL (ref 8–23)
CO2: 25 mmol/L (ref 22–32)
Calcium: 9.4 mg/dL (ref 8.9–10.3)
Chloride: 106 mmol/L (ref 98–111)
Creatinine, Ser: 1.23 mg/dL — ABNORMAL HIGH (ref 0.44–1.00)
GFR, Estimated: 50 mL/min — ABNORMAL LOW (ref >60)
Glucose, Bld: 105 mg/dL — ABNORMAL HIGH (ref 70–99)
Potassium: 3.9 mmol/L (ref 3.5–5.1)
Sodium: 139 mmol/L (ref 135–145)
Total Bilirubin: 0.4 mg/dL (ref 0.3–1.2)
Total Protein: 6.8 g/dL (ref 6.5–8.1)

## 2020-07-13 LAB — MAGNESIUM: Magnesium: 1.9 mg/dL (ref 1.7–2.4)

## 2020-07-13 LAB — TOTAL PROTEIN, URINE DIPSTICK: Protein, ur: NEGATIVE mg/dL

## 2020-07-13 MED ORDER — HEPARIN SOD (PORK) LOCK FLUSH 100 UNIT/ML IV SOLN
500.0000 [IU] | Freq: Once | INTRAVENOUS | Status: AC
  Administered 2020-07-13: 500 [IU]
  Filled 2020-07-13: qty 5

## 2020-07-13 MED ORDER — SODIUM CHLORIDE 0.9% FLUSH
10.0000 mL | Freq: Once | INTRAVENOUS | Status: AC
  Administered 2020-07-13: 10 mL
  Filled 2020-07-13: qty 10

## 2020-07-13 NOTE — Assessment & Plan Note (Signed)
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

## 2020-07-13 NOTE — Assessment & Plan Note (Signed)
I have reviewed the most current guidelines with the patient Recently, the FDA has approved combination chemotherapy with bevacizumab and pembrolizumab for cervical cancer Once she completes radiation treatment, she will get dental clearance I plan to order PET CT scan for staging before starting her on combination chemotherapy We discussed the risk, benefits, side effects of treatment with carboplatin, paclitaxel, bevacizumab and pembrolizumab and she is in agreement to proceed I will schedule I week follow-up after treatment to assess toxicity and blood pressure control She is in agreement

## 2020-07-13 NOTE — Progress Notes (Signed)
[facility] OFFICE PROGRESS NOTE  Patient Care Team: [name], MD as PCP - General (Family Medicine) [name], MD as Consulting Physician (Obstetrics and Gynecology) [name], RN as Oncology Nurse Navigator (Oncology)  ASSESSMENT & PLAN:  Cervical cancer, FIGO stage IVB [facility]) I have reviewed the most current guidelines with the patient Recently, the FDA has approved combination chemotherapy with bevacizumab and pembrolizumab for cervical cancer Once she completes radiation treatment, she will get dental clearance I plan to order PET CT scan for staging before starting her on combination chemotherapy We discussed the risk, benefits, side effects of treatment with carboplatin, paclitaxel, bevacizumab and pembrolizumab and she is in agreement to proceed I will schedule I week follow-up after treatment to assess toxicity and blood pressure control She is in agreement  Metastasis to bone [facility]) She will get dental clearance before we proceed with Zometa She will get dental extraction next week  Hydronephrosis of left kidney Her renal function is stable  SLE (systemic lupus erythematosus) ([place]) Diagnosis of SLE was not definitive She is aware of risk of flare of autoimmune disorder while using pembrolizumab and agreed to take the risk   Orders Placed This Encounter  Procedures  . NM PET Image Restage (PS) Skull Base to Thigh    Standing Status:   Future    Standing Expiration Date:   07/13/2021    Order Specific Question:   If indicated for the ordered procedure, I authorize the administration of a radiopharmaceutical per Radiology protocol    Answer:   Yes    Order Specific Question:   Preferred imaging location?    Answer:   [facility]    Order Specific Question:   Radiology Contrast Protocol - do NOT remove file path    Answer:   [email or web address]    All questions were answered. The patient  knows to call the clinic with any problems, questions or concerns. The total time spent in the appointment was 40 minutes encounter with patients including review of chart and various tests results, discussions about plan of care and coordination of care plan   [name], MD 07/13/2020 2:14 PM  INTERVAL HISTORY: Please see below for problem oriented charting. She returns for further follow-up She is doing well Her energy level is almost back to baseline She is eating better She has discontinued dexamethasone a week ago Denies recent changes in bowel habits No recent vaginal bleeding  SUMMARY OF ONCOLOGIC HISTORY: Oncology History  Cervical cancer, FIGO stage IVB ([place])  03/16/2020 Initial Diagnosis   The patient had history of new onset vaginal spotting on March 16, 2020.  She immediately called her OB/GYN's office and achieved an appointment with her OB/GYN's partner, Dr. Murrell Redden, who saw and evaluated the patient on March 19, 2020.  At that time a transvaginal ultrasound scan was performed which revealed a uterus measuring 5.3 x 8.4 x 4.6 cm with an ill-defined endometrium.  There is a complex cyst on the left ovary measuring 2.6 cm with peripheral blood flow noted.  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  Immunostains were positive for CEA and CDX2, negative for p16, ER, vimentin, and PAX8.  The differential diagnosis included primary gastric type endocervical adenocarcinoma (though this typically stains positive for PAX8) or a metastasis from pancreaticobiliary or upper GI primary.   03/29/2020 Pathology Results   1. Surgical [P], duodenum -  PEPTIC DUODENITIS. - NO DYSPLASIA OR MALIGNANCY. 2. Surgical [P], gastric antrum and gastric body - REACTIVE GASTROPATHY. - WARTHIN-STARRY IS NEGATIVE FOR HELICOBACTER PYLORI. - NO INTESTINAL METAPLASIA, DYSPLASIA, OR  MALIGNANCY. 3. Surgical [P], gastric nodule - MILD REACTIVE GASTROPATHY. - NO INTESTINAL METAPLASIA, DYSPLASIA, OR MALIGNANCY. 4. Surgical [P], stomach, lesser curve ulcers - REACTIVE GASTROPATHY WITH EROSIONS. - NO INTESTINAL METAPLASIA, DYSPLASIA, OR MALIGNANCY. 5. Surgical [P], gastric polyps - HYPERPLASTIC POLYP. - NO INTESTINAL METAPLASIA, DYSPLASIA OR MALIGNANCY. 6. Surgical [P], colon, descending, polyp - HYPERPLASTIC POLYP. - NO DYSPLASIA OR MALIGNANCY.   04/06/2020 Procedure   EGD report  Normal esophagus. - A few gastric polyps. Suspected fundic gland polyps. - Non-bleeding gastric ulcers with pigmented material. Biopsied. - A single gastric polyp/nodule. Resected and retrieved. - Erythematous duodenopathy. Biopsied. - The examination was otherwise normal.   04/06/2020 Procedure   Colonoscopy report - Non-bleeding internal hemorrhoids. - One 4 mm polyp in the descending colon, removed with a cold snare. Resected and retrieved. - The examination was otherwise normal on direct and retroflexion views   04/19/2020 PET scan   1. Hypermetabolic uterine cervix mass compatible with known primary cervical malignancy, with hypermetabolism extending into the upper third of the vagina and extending throughout the uterine body. No frank extrauterine extension. 2. Mild to moderate left hydroureteronephrosis to the level of the left UVJ, concerning for tumor involvement of the left UVJ. 3. Hypermetabolic right common iliac nodal metastasis. 4. Hypermetabolic sclerotic right ischial bone metastasis. 5. Two indeterminate small scattered solid pulmonary nodules, below PET resolution, warranting attention on chest CT follow-up in 3 months. 6.  Aortic Atherosclerosis (ICD10-I70.0).   04/19/2020 Cancer Staging   Staging form: Cervix Uteri, AJCC Version 9 - Clinical stage from 04/19/2020: FIGO Stage IVB (cT3b, cN1a, cM1) - Signed by Gorsuch, Ni, MD on 04/19/2020   04/26/2020 Procedure    Successful placement of a right IJ approach Power Port with ultrasound and fluoroscopic guidance. The catheter is ready for use   04/30/2020 -  Chemotherapy   The patient had cisplatin for chemotherapy treatment.     06/20/2020 Genetic Testing   Negative genetic testing on the CancerNext-Expanded+RNAinsight panel.  The CancerNext-Expanded gene panel offered by [employer] and includes sequencing and rearrangement analysis for the following 77 genes: AIP, ALK, APC*, ATM*, AXIN2, BAP1, BARD1, BLM, BMPR1A, BRCA1*, BRCA2*, BRIP1*, CDC73, CDH1*, CDK4, CDKN1B, CDKN2A, CHEK2*, CTNNA1, DICER1, FANCC, FH, FLCN, GALNT12, KIF1B, LZTR1, MAX, MEN1, MET, MLH1*, MSH2*, MSH3, MSH6*, MUTYH*, NBN, NF1*, NF2, NTHL1, PALB2*, PHOX2B, PMS2*, POT1, PRKAR1A, PTCH1, PTEN*, RAD51C*, RAD51D*, RB1, RECQL, RET, SDHA, SDHAF2, SDHB, SDHC, SDHD, SMAD4, SMARCA4, SMARCB1, SMARCE1, STK11, SUFU, TMEM127, TP53*, TSC1, TSC2, VHL and XRCC2 (sequencing and deletion/duplication); EGFR, EGLN1, HOXB13, KIT, MITF, PDGFRA, POLD1, and POLE (sequencing only); EPCAM and GREM1 (deletion/duplication only). DNA and RNA analyses performed for * genes. The report date is June 20, 2020.   07/23/2020 -  Chemotherapy    Patient is on Treatment Plan: CERVICAL PEMBROLIZUMAB/CARBOPLATIN + PACLITAXEL + BEVACIZUMAB Q21D       Metastasis to bone (HCC)  04/19/2020 Initial Diagnosis   Metastasis to bone (HCC)   07/23/2020 -  Chemotherapy    Patient is on Treatment Plan: CERVICAL PEMBROLIZUMAB/CARBOPLATIN + PACLITAXEL + BEVACIZUMAB Q21D         REVIEW OF SYSTEMS:   Constitutional: Denies fevers, chills or abnormal weight loss Eyes: Denies blurriness of vision Ears, nose, mouth, throat, and face: Denies mucositis or sore throat Respiratory: Denies cough, dyspnea or wheezes Cardiovascular:   Denies palpitation, chest discomfort or lower extremity swelling Gastrointestinal:  Denies nausea, heartburn or change in bowel habits Skin: Denies abnormal skin  rashes Lymphatics: Denies new lymphadenopathy or easy bruising Neurological:Denies numbness, tingling or new weaknesses Behavioral/Psych: Mood is stable, no new changes  All other systems were reviewed with the patient and are negative.  I have reviewed the past medical history, past surgical history, social history and family history with the patient and they are unchanged from previous note.  ALLERGIES:  is allergic to penicillins.  MEDICATIONS:  Current Outpatient Medications  Medication Sig Dispense Refill  . Cholecalciferol (VITAMIN D3) 2000 units TABS Take 2 tablets by mouth daily.    . clobetasol cream (TEMOVATE) 0.05 % Apply topically as needed. Left leg    . fenofibrate 160 MG tablet TAKE 1 TABLET BY MOUTH EVERY DAY (Patient taking differently: Take 160 mg by mouth daily with lunch.) 30 tablet 5  . lidocaine-prilocaine (EMLA) cream Apply to affected area once 30 g 3  . LORazepam (ATIVAN) 0.5 MG tablet Take 1 tablet (0.5 mg total) by mouth every 8 (eight) hours as needed for anxiety. 30 tablet 0  . ondansetron (ZOFRAN) 8 MG tablet Take 1 tablet (8 mg total) by mouth every 8 (eight) hours as needed. Start on the third day after cisplatin chemotherapy. 30 tablet 1  . pantoprazole (PROTONIX) 40 MG tablet Take 1 tablet (40 mg total) by mouth 2 (two) times daily. 90 tablet 3  . prochlorperazine (COMPAZINE) 10 MG tablet Take 1 tablet (10 mg total) by mouth every 6 (six) hours as needed (Nausea or vomiting). 30 tablet 1  . traMADol (ULTRAM) 50 MG tablet Take 2 tablets (100 mg total) by mouth every 6 (six) hours as needed. 60 tablet 0   No current facility-administered medications for this visit.    PHYSICAL EXAMINATION: ECOG PERFORMANCE STATUS: 1 - Symptomatic but completely ambulatory  Vitals:   07/13/20 1001  BP: 136/85  Pulse: 74  Resp: 18  Temp: 98.3 F (36.8 C)  SpO2: 100%   Filed Weights   07/13/20 1001  Weight: 211 lb 6.4 oz (95.9 kg)    GENERAL:alert, no distress  and comfortable NEURO: alert & oriented x 3 with fluent speech, no focal motor/sensory deficits  LABORATORY DATA:  I have reviewed the data as listed    Component Value Date/Time   NA 139 07/13/2020 0950   NA 140 06/21/2016 1530   K 3.9 07/13/2020 0950   CL 106 07/13/2020 0950   CO2 25 07/13/2020 0950   GLUCOSE 105 (H) 07/13/2020 0950   BUN 17 07/13/2020 0950   BUN 16 06/21/2016 1530   CREATININE 1.23 (H) 07/13/2020 0950   CREATININE 0.97 06/16/2020 1318   CREATININE 0.87 07/02/2017 1549   CALCIUM 9.4 07/13/2020 0950   PROT 6.8 07/13/2020 0950   PROT 7.3 06/21/2016 1530   ALBUMIN 3.6 07/13/2020 0950   ALBUMIN 4.3 06/21/2016 1530   AST 11 (L) 07/13/2020 0950   ALT 14 07/13/2020 0950   ALKPHOS 60 07/13/2020 0950   BILITOT 0.4 07/13/2020 0950   BILITOT 0.3 06/21/2016 1530   GFRNONAA 50 (L) 07/13/2020 0950   GFRNONAA >60 06/16/2020 1318   GFRAA 111 06/21/2016 1530    No results found for: SPEP, UPEP  Lab Results  Component Value Date   WBC 5.4 07/13/2020   NEUTROABS 4.2 07/13/2020   HGB 10.5 (L) 07/13/2020   HCT 30.7 (L) 07/13/2020   MCV 91.9 07/13/2020   PLT 159 07/13/2020        Chemistry      Component Value Date/Time   NA 139 07/13/2020 0950   NA 140 06/21/2016 1530   K 3.9 07/13/2020 0950   CL 106 07/13/2020 0950   CO2 25 07/13/2020 0950   BUN 17 07/13/2020 0950   BUN 16 06/21/2016 1530   CREATININE 1.23 (H) 07/13/2020 0950   CREATININE 0.97 06/16/2020 1318   CREATININE 0.87 07/02/2017 1549      Component Value Date/Time   CALCIUM 9.4 07/13/2020 0950   ALKPHOS 60 07/13/2020 0950   AST 11 (L) 07/13/2020 0950   ALT 14 07/13/2020 0950   BILITOT 0.4 07/13/2020 0950   BILITOT 0.3 06/21/2016 1530

## 2020-07-13 NOTE — Assessment & Plan Note (Signed)
Her renal function is stable 

## 2020-07-13 NOTE — Assessment & Plan Note (Signed)
She will get dental clearance before we proceed with Zometa She will get dental extraction next week

## 2020-07-14 ENCOUNTER — Ambulatory Visit
Admission: RE | Admit: 2020-07-14 | Discharge: 2020-07-14 | Disposition: A | Discharge status: Home / Self Care | Payer: Managed Care, Other (non HMO) | Source: Ambulatory Visit | Attending: Radiation Oncology | Admitting: Radiation Oncology

## 2020-07-14 ENCOUNTER — Encounter: Payer: Self-pay | Admitting: Radiation Oncology

## 2020-07-14 ENCOUNTER — Encounter (HOSPITAL_BASED_OUTPATIENT_CLINIC_OR_DEPARTMENT_OTHER)
Admission: RE | Disposition: A | Discharge status: Home / Self Care | Payer: Self-pay | Source: Home / Self Care | Attending: Radiation Oncology

## 2020-07-14 ENCOUNTER — Other Ambulatory Visit: Payer: Self-pay

## 2020-07-14 ENCOUNTER — Ambulatory Visit (HOSPITAL_COMMUNITY)
Admission: RE | Admit: 2020-07-14 | Discharge: 2020-07-14 | Disposition: A | Discharge status: Home / Self Care | Payer: Managed Care, Other (non HMO) | Source: Ambulatory Visit | Attending: Radiation Oncology | Admitting: Radiation Oncology

## 2020-07-14 ENCOUNTER — Ambulatory Visit (HOSPITAL_BASED_OUTPATIENT_CLINIC_OR_DEPARTMENT_OTHER): Payer: Managed Care, Other (non HMO) | Admitting: Anesthesiology

## 2020-07-14 ENCOUNTER — Ambulatory Visit (HOSPITAL_BASED_OUTPATIENT_CLINIC_OR_DEPARTMENT_OTHER)
Admission: RE | Admit: 2020-07-14 | Discharge: 2020-07-14 | Disposition: A | Discharge status: Home / Self Care | Payer: Managed Care, Other (non HMO) | Attending: Radiation Oncology | Admitting: Radiation Oncology

## 2020-07-14 ENCOUNTER — Encounter (HOSPITAL_BASED_OUTPATIENT_CLINIC_OR_DEPARTMENT_OTHER): Payer: Self-pay | Admitting: Radiation Oncology

## 2020-07-14 VITALS — BP 138/70 | HR 75 | Temp 97.7°F | Resp 18

## 2020-07-14 DIAGNOSIS — C7951 Secondary malignant neoplasm of bone: Secondary | ICD-10-CM | POA: Insufficient documentation

## 2020-07-14 DIAGNOSIS — C539 Malignant neoplasm of cervix uteri, unspecified: Secondary | ICD-10-CM

## 2020-07-14 DIAGNOSIS — Z8616 Personal history of COVID-19: Secondary | ICD-10-CM | POA: Insufficient documentation

## 2020-07-14 DIAGNOSIS — Z8049 Family history of malignant neoplasm of other genital organs: Secondary | ICD-10-CM | POA: Insufficient documentation

## 2020-07-14 DIAGNOSIS — Z9221 Personal history of antineoplastic chemotherapy: Secondary | ICD-10-CM | POA: Diagnosis not present

## 2020-07-14 DIAGNOSIS — Z923 Personal history of irradiation: Secondary | ICD-10-CM | POA: Insufficient documentation

## 2020-07-14 DIAGNOSIS — Z880 Allergy status to penicillin: Secondary | ICD-10-CM | POA: Insufficient documentation

## 2020-07-14 DIAGNOSIS — Z803 Family history of malignant neoplasm of breast: Secondary | ICD-10-CM | POA: Insufficient documentation

## 2020-07-14 HISTORY — PX: TANDEM RING INSERTION: SHX6199

## 2020-07-14 HISTORY — PX: OPERATIVE ULTRASOUND: SHX5996

## 2020-07-14 SURGERY — INSERTION, UTERINE TANDEM AND RING OR CYLINDER, FOR BRACHYTHERAPY
Anesthesia: General | Site: Vagina

## 2020-07-14 MED ORDER — FENTANYL CITRATE (PF) 100 MCG/2ML IJ SOLN
25.0000 ug | INTRAMUSCULAR | Status: DC | PRN
  Administered 2020-07-14: 50 ug via INTRAVENOUS
  Administered 2020-07-14: 25 ug via INTRAVENOUS

## 2020-07-14 MED ORDER — DEXAMETHASONE SODIUM PHOSPHATE 10 MG/ML IJ SOLN
INTRAMUSCULAR | Status: AC
  Filled 2020-07-14: qty 1

## 2020-07-14 MED ORDER — FENTANYL CITRATE (PF) 100 MCG/2ML IJ SOLN
INTRAMUSCULAR | Status: AC
  Filled 2020-07-14: qty 2

## 2020-07-14 MED ORDER — LACTATED RINGERS IV SOLN: INTRAVENOUS | Status: DC

## 2020-07-14 MED ORDER — MIDAZOLAM HCL 5 MG/5ML IJ SOLN
INTRAMUSCULAR | Status: DC | PRN
  Administered 2020-07-14: 2 mg via INTRAVENOUS

## 2020-07-14 MED ORDER — LIDOCAINE HCL (PF) 2 % IJ SOLN
INTRAMUSCULAR | Status: AC
  Filled 2020-07-14: qty 5

## 2020-07-14 MED ORDER — LIDOCAINE 2% (20 MG/ML) 5 ML SYRINGE
INTRAMUSCULAR | Status: DC | PRN
  Administered 2020-07-14: 60 mg via INTRAVENOUS

## 2020-07-14 MED ORDER — KETOROLAC TROMETHAMINE 30 MG/ML IJ SOLN
INTRAMUSCULAR | Status: AC
  Filled 2020-07-14: qty 1

## 2020-07-14 MED ORDER — KETOROLAC TROMETHAMINE 30 MG/ML IJ SOLN
INTRAMUSCULAR | Status: DC | PRN
  Administered 2020-07-14: 30 mg via INTRAVENOUS

## 2020-07-14 MED ORDER — FENTANYL CITRATE (PF) 100 MCG/2ML IJ SOLN
INTRAMUSCULAR | Status: DC | PRN
  Administered 2020-07-14 (×2): 25 ug via INTRAVENOUS
  Administered 2020-07-14: 50 ug via INTRAVENOUS

## 2020-07-14 MED ORDER — MIDAZOLAM HCL 2 MG/2ML IJ SOLN
INTRAMUSCULAR | Status: AC
  Filled 2020-07-14: qty 2

## 2020-07-14 MED ORDER — POVIDONE-IODINE 10 % EX SWAB: 2.0000 "application " | Freq: Once | CUTANEOUS | Status: DC

## 2020-07-14 MED ORDER — ONDANSETRON HCL 4 MG/2ML IJ SOLN
INTRAMUSCULAR | Status: DC | PRN
  Administered 2020-07-14: 4 mg via INTRAVENOUS

## 2020-07-14 MED ORDER — PROPOFOL 10 MG/ML IV BOLUS
INTRAVENOUS | Status: AC
  Filled 2020-07-14: qty 40

## 2020-07-14 MED ORDER — PROPOFOL 10 MG/ML IV BOLUS
INTRAVENOUS | Status: DC | PRN
  Administered 2020-07-14: 150 mg via INTRAVENOUS

## 2020-07-14 MED ORDER — SODIUM CHLORIDE 0.9 % IR SOLN
Status: DC | PRN
  Administered 2020-07-14: 1000 mL via INTRAVESICAL

## 2020-07-14 MED ORDER — ACETAMINOPHEN 500 MG PO TABS
ORAL_TABLET | ORAL | Status: AC
  Filled 2020-07-14: qty 2

## 2020-07-14 MED ORDER — ACETAMINOPHEN 500 MG PO TABS
1000.0000 mg | ORAL_TABLET | Freq: Once | ORAL | Status: AC
  Administered 2020-07-14: 1000 mg via ORAL

## 2020-07-14 MED ORDER — ONDANSETRON HCL 4 MG/2ML IJ SOLN
INTRAMUSCULAR | Status: AC
  Filled 2020-07-14: qty 2

## 2020-07-14 MED ORDER — DEXAMETHASONE SODIUM PHOSPHATE 4 MG/ML IJ SOLN
INTRAMUSCULAR | Status: DC | PRN
  Administered 2020-07-14: 10 mg via INTRAVENOUS

## 2020-07-14 SURGICAL SUPPLY — 19 items
BNDG CONFORM 2 STRL LF (GAUZE/BANDAGES/DRESSINGS) IMPLANT
COVER WAND RF STERILE (DRAPES) ×3 IMPLANT
DILATOR CANAL MILEX (MISCELLANEOUS) IMPLANT
DRSG PAD ABDOMINAL 8X10 ST (GAUZE/BANDAGES/DRESSINGS) ×3 IMPLANT
GAUZE 4X4 16PLY RFD (DISPOSABLE) ×3 IMPLANT
GLOVE BIO SURGEON STRL SZ7.5 (GLOVE) ×6 IMPLANT
GOWN STRL REUS W/TWL LRG LVL3 (GOWN DISPOSABLE) ×6 IMPLANT
HOLDER FOLEY CATH W/STRAP (MISCELLANEOUS) ×3 IMPLANT
IV NS 1000ML (IV SOLUTION) ×3
IV NS 1000ML BAXH (IV SOLUTION) ×2 IMPLANT
IV SET EXTENSION GRAVITY 40 LF (IV SETS) ×3 IMPLANT
KIT TURNOVER CYSTO (KITS) ×3 IMPLANT
MAT PREVALON FULL STRYKER (MISCELLANEOUS) ×3 IMPLANT
PACK VAGINAL MINOR WOMEN LF (CUSTOM PROCEDURE TRAY) ×3 IMPLANT
PACKING VAGINAL (PACKING) IMPLANT
PAD OB MATERNITY 4.3X12.25 (PERSONAL CARE ITEMS) IMPLANT
TOWEL OR 17X26 10 PK STRL BLUE (TOWEL DISPOSABLE) ×3 IMPLANT
TRAY FOLEY W/BAG SLVR 14FR LF (SET/KITS/TRAYS/PACK) ×3 IMPLANT
WATER STERILE IRR 500ML POUR (IV SOLUTION) ×3 IMPLANT

## 2020-07-14 NOTE — Anesthesia Procedure Notes (Signed)
Procedure Name: LMA Insertion Date/Time: 07/14/2020 7:44 AM Performed by: Justice Rocher, CRNA Pre-anesthesia Checklist: Patient identified, Emergency Drugs available, Suction available, Patient being monitored and Timeout performed Patient Re-evaluated:Patient Re-evaluated prior to induction Oxygen Delivery Method: Circle system utilized Preoxygenation: Pre-oxygenation with 100% oxygen Induction Type: IV induction Ventilation: Mask ventilation without difficulty LMA: LMA inserted LMA Size: 4.0 Number of attempts: 1 Airway Equipment and Method: Bite block Placement Confirmation: positive ETCO2,  breath sounds checked- equal and bilateral and CO2 detector Tube secured with: Tape Dental Injury: Teeth and Oropharynx as per pre-operative assessment

## 2020-07-14 NOTE — Interval H&P Note (Signed)
History and Physical Interval Note:  07/14/2020 7:34 AM  Phyllis Hebert  has presented today for surgery, with the diagnosis of CERVICAL CANCER.  The various methods of treatment have been discussed with the patient and family. After consideration of risks, benefits and other options for treatment, the patient has consented to  Procedure(s): TANDEM RING INSERTION (N/A) OPERATIVE ULTRASOUND (N/A) as a surgical intervention.  The patient's history has been reviewed, patient examined, no change in status, stable for surgery.  I have reviewed the patient's chart and labs.  Questions were answered to the patient's satisfaction.     Gery Pray

## 2020-07-14 NOTE — Progress Notes (Signed)
Patient arrived  On the unit @ 930am. Vitals were 134/82 pulse 65 oxygen 100% on room air and temp was 97.9.Patient reported her pain being a 2. Stated that she did not need any pain medication . 500 ml of urine was emptied from the patients foley @ 1000 am and another 400 ml was emptied at 1200 pm.Equipment  was removed by Dr.Kinard @ 1220 pm. Patient stated that she did not need any pain medication .Rated her pain a 1 . On  A scale 0-10.Patients foley cather and IV was dc 'd at 1230. Patients vital were 138/70 75 pulse 100% oxygen on room air and temp 97.7. Patient was given discharge instruction and verbalized understanding. Patient was taken up stairs by wheel chair to wait on her ride.

## 2020-07-14 NOTE — Progress Notes (Signed)
  Radiation Oncology         [phone] ________________________________  Name: Phyllis [facility]bert MRN: [id number]  Date: 07/14/2020  DOB: Nov 29, 1955  CC: [name], MD  [name], MD  HDR BRACHYTHERAPY NOTE  DIAGNOSIS: Stage IVB (cT3b, cN1a, cM1) adenocarcinoma of the cervix  Simple treatment device note: In the operating room the patient had construction of her custom tandem ring system. She will be treated with a 60 tandem/ring system with a 6.0 cm  tandem. This conforms to her anatomy without undue discomfort. A rectal paddle was also part of her custom set up device.  Vaginal brachytherapy procedure node: The patient was brought to the [place] suite. Identity was confirmed. All relevant records and images related to the planned course of therapy were reviewed. The patient freely provided informed written consent to proceed with treatment after reviewing the details related to the planned course of therapy. The consent form was witnessed and verified by the simulation staff. Then, the patient was set-up in a stable reproducible supine position for radiation therapy. The tandem cylinder system was accessed and fiducial markers were placed within the tandem and cylinder.   Verification simulation note: An AP and lateral film was obtained through the pelvis area. This was compared to the patient's planning films documenting accurate position of the tandem/cylinder system for treatment.  High-dose-rate brachytherapy treatment note:   The remote afterloading device was accessed through catheter system and attached to the tandem ring system. Patient then proceeded to undergo her fourth high-dose-rate treatment directed at the cervix. The patient was prescribed a dose of 5.5 gray to be delivered to the high risk clinical target volume.  Patient was treated with 2 channels using 25 dwell positions. Treatment time was 350.8 seconds. The patient tolerated the procedure well. After  completion of her therapy, a radiation survey was performed documenting return of the iridium source into the [employer] safe. The patient was then transferred to the nursing suite.  She then had removal of the rectal paddle followed by the tandem ring system. The patient tolerated the removal well. No bleeding noted. Cervical sleeve remained in place.   PLAN: The patient will return next week for her fifth and final high-dose-rate treatment. ________________________________     Blair Promise, PhD, MD  This document serves as a record of services personally performed by Gery Pray, MD. It was created on his behalf by Clerance Lav, a trained medical scribe. The creation of this record is based on the scribe's personal observations and the provider's statements to them. This document has been checked and approved by the attending provider.

## 2020-07-14 NOTE — Anesthesia Preprocedure Evaluation (Addendum)
Anesthesia Evaluation  Patient identified by MRN, date of birth, ID band Patient awake    Reviewed: Allergy & Precautions, NPO status , Patient's Chart, lab work & pertinent test results  Airway Mallampati: II  TM Distance: >3 FB Neck ROM: Full    Dental no notable dental hx. (+) Teeth Intact, Dental Advisory Given   Pulmonary neg pulmonary ROS,    Pulmonary exam normal breath sounds clear to auscultation       Cardiovascular negative cardio ROS Normal cardiovascular exam Rhythm:Regular Rate:Normal     Neuro/Psych PSYCHIATRIC DISORDERS Anxiety negative neurological ROS     GI/Hepatic Neg liver ROS, GERD  Medicated and Controlled,  Endo/Other  Hypothyroidism SLE  Renal/GU Renal InsufficiencyRenal disease (Cr 1.23, K 3.9)  negative genitourinary   Musculoskeletal  (+) Arthritis ,   Abdominal   Peds  Hematology  (+) Blood dyscrasia (Hgb 10.5), anemia ,   Anesthesia Other Findings Stage IV cervical CA  Reproductive/Obstetrics                            Anesthesia Physical Anesthesia Plan  ASA: II  Anesthesia Plan: General   Post-op Pain Management:    Induction: Intravenous  PONV Risk Score and Plan: 3 and Ondansetron, Dexamethasone and Midazolam  Airway Management Planned: LMA  Additional Equipment:   Intra-op Plan:   Post-operative Plan: Extubation in OR  Informed Consent: I have reviewed the patients History and Physical, chart, labs and discussed the procedure including the risks, benefits and alternatives for the proposed anesthesia with the patient or authorized representative who has indicated his/her understanding and acceptance.     Dental advisory given  Plan Discussed with: CRNA  Anesthesia Plan Comments:         Anesthesia Quick Evaluation

## 2020-07-14 NOTE — Anesthesia Postprocedure Evaluation (Signed)
Anesthesia Post Note  Patient: Phyllis Hebert  Procedure(s) Performed: TANDEM RING INSERTION (N/A Vagina ) OPERATIVE ULTRASOUND (N/A Abdomen)     Patient location during evaluation: PACU Anesthesia Type: General Level of consciousness: awake and alert Pain management: pain level controlled Vital Signs Assessment: post-procedure vital signs reviewed and stable Respiratory status: spontaneous breathing, nonlabored ventilation, respiratory function stable and patient connected to nasal cannula oxygen Cardiovascular status: blood pressure returned to baseline and stable Postop Assessment: no apparent nausea or vomiting Anesthetic complications: no   No complications documented.  Last Vitals:  Vitals:   07/14/20 0906 07/14/20 0915  BP:  132/73  Pulse:  63  Resp:  17  Temp: 36.6 C   SpO2:  96%    Last Pain:  Vitals:   07/14/20 0915  TempSrc:   PainSc: 1                  Alizon Schmeling L Michale Emmerich

## 2020-07-14 NOTE — Discharge Instructions (Signed)

## 2020-07-14 NOTE — Op Note (Signed)
07/14/2020  8:42 AM  PATIENT:  Phyllis Hebert  65 y.o. female  PRE-OPERATIVE DIAGNOSIS:  CERVICAL CANCER  POST-OPERATIVE DIAGNOSIS:  CERVICAL CANCER  PROCEDURE:  Procedure(s): TANDEM RING INSERTION (N/A) OPERATIVE ULTRASOUND (N/A)  SURGEON:  Surgeon(s) and Role:    * Gery Pray, MD - Primary  PHYSICIAN ASSISTANT:   ASSISTANTS: none   ANESTHESIA:   general  EBL:  5 mL   BLOOD ADMINISTERED:none  DRAINS: Urinary Catheter (Foley)   LOCAL MEDICATIONS USED:  NONE  SPECIMEN:  No Specimen  DISPOSITION OF SPECIMEN:  N/A  COUNTS:  YES  TOURNIQUET:  * No tourniquets in log *  DICTATION: Patient was taken to outpatient OR #4. Timeout was performed for the estimated length of procedure, procedure, preoperative antibiotics, allergies. Patient was prepped and draped in the usual sterile fashion and placed in the dorsolithotomy position. A Foley catheter was placed without difficulty. Clear yellowish urine was obtained. Patient then underwent exam under anesthesia,based on intraoperative exam and ultrasound findings the cervical mass was approximately 3.0 x 2.6 cm, a significant decrease in size compared to pretreatment. The cervical mass however continue to be quite firm with firm induration throughout the cervix.The cervical os was flush with the upper vaginal area.  The cervical sleeve placed last week remained in good position.  Initial ultrasound imaging after instillation of approximately 250 cc of sterile water in the bladder were good.  the patient then had placement of a  60 mm,60degree tandemwithin the endometrial cavity and cervical region. Attached to this device was a60 degree cervical ringwith a small shielding cap in place. The patient then had placement of a rectal paddle posteriorly to limit dose to the rectal mucosa.  Ultrasound at completion of placement of the tandem ring showed excellent position within the central uterus and endocervical canal.  ThePatient tolerated the procedure well was transferred for to the recovery room in stable condition. Later in the day the patient will be transferred to radiation oncology for simulation, planning and her fourth high-dose-rate treatment. Patient is to receive a total of 5 high-dose-rate treatments with estimated dose per treatment to the high risk clinical target volume of 5.5 Gy. Iridium 192 will be the high-dose-rate source.  PLAN OF CARE: Transferred to radiation oncology for planning and treatment  PATIENT DISPOSITION:  PACU - hemodynamically stable.   Delay start of Pharmacological VTE agent (>24hrs) due to surgical blood loss or risk of bleeding: not applicable

## 2020-07-14 NOTE — Transfer of Care (Signed)
Immediate Anesthesia Transfer of Care Note  Patient: Phyllis Hebert  Procedure(s) Performed: Procedure(s) (LRB): TANDEM RING INSERTION (N/A) OPERATIVE ULTRASOUND (N/A)  Patient Location: PACU  Anesthesia Type: General  Level of Consciousness: awake, sedated, patient cooperative and responds to stimulation  Airway & Oxygen Therapy: Patient Spontanous Breathing and Patient connected to [place] 02 and cloth FM   Post-op Assessment: Report given to PACU RN, Post -op Vital signs reviewed and stable and Patient moving all extremities  Post vital signs: Reviewed and stable  Complications: No apparent anesthesia complications

## 2020-07-15 ENCOUNTER — Other Ambulatory Visit: Payer: Self-pay

## 2020-07-15 ENCOUNTER — Encounter (HOSPITAL_BASED_OUTPATIENT_CLINIC_OR_DEPARTMENT_OTHER): Payer: Self-pay | Admitting: Radiation Oncology

## 2020-07-15 NOTE — Progress Notes (Signed)
Spoke w/ via phone for pre-op interview--- PT Lab needs dos---- no  (per anes)/  Pre-op orders pending          Lab results------ no COVID test ------ 07-17-2020 @ 1155 Arrive at ------- 0530 on 07-19-2020 NPO after MN NO Solid Food.  Clear liquids from MN until--- 0430 Medications to take morning of surgery ----- Protonix Diabetic medication ----- n/a Patient Special Instructions ----- this is pt's last tandem w/ radiation ,  CONGRATS!!! Pre-Op special Istructions ----- callled and left message w/ Enid Derry at Dr Sondra Come office requested pre-op orders Patient verbalized understanding of instructions that were given at this phone interview. Patient denies shortness of breath, chest pain, fever, cough at this phone interview.

## 2020-07-16 ENCOUNTER — Other Ambulatory Visit (HOSPITAL_COMMUNITY)
Admission: RE | Admit: 2020-07-16 | Discharge: 2020-07-16 | Disposition: A | Discharge status: Home / Self Care | Payer: Managed Care, Other (non HMO) | Source: Ambulatory Visit | Attending: Radiation Oncology | Admitting: Radiation Oncology

## 2020-07-16 DIAGNOSIS — Z20822 Contact with and (suspected) exposure to covid-19: Secondary | ICD-10-CM | POA: Diagnosis not present

## 2020-07-16 DIAGNOSIS — Z01812 Encounter for preprocedural laboratory examination: Secondary | ICD-10-CM | POA: Diagnosis not present

## 2020-07-16 LAB — SARS CORONAVIRUS 2 (TAT 6-24 HRS): SARS Coronavirus 2: NEGATIVE

## 2020-07-17 ENCOUNTER — Other Ambulatory Visit (HOSPITAL_COMMUNITY): Payer: Managed Care, Other (non HMO)

## 2020-07-17 NOTE — Anesthesia Preprocedure Evaluation (Addendum)
Anesthesia Evaluation  Patient identified by MRN, date of birth, ID band  Reviewed: Allergy & Precautions, NPO status , Patient's Chart, lab work & pertinent test results  Airway Mallampati: I  TM Distance: >3 FB Neck ROM: Full    Dental no notable dental hx. (+) Teeth Intact, Dental Advisory Given   Pulmonary neg pulmonary ROS,    Pulmonary exam normal breath sounds clear to auscultation       Cardiovascular Exercise Tolerance: Good Normal cardiovascular exam Rhythm:Regular Rate:Normal     Neuro/Psych PSYCHIATRIC DISORDERS Anxiety negative neurological ROS     GI/Hepatic Neg liver ROS, GERD  ,  Endo/Other    Renal/GU Renal InsufficiencyRenal disease     Musculoskeletal  (+) Arthritis ,   Abdominal (+) + obese,   Peds  Hematology  (+) anemia , hgb 10.5   Anesthesia Other Findings   Reproductive/Obstetrics                         Anesthesia Physical Anesthesia Plan  ASA: III  Anesthesia Plan: General   Post-op Pain Management:    Induction: Intravenous  PONV Risk Score and Plan: 4 or greater and Treatment may vary due to age or medical condition, Ondansetron, Dexamethasone and Midazolam  Airway Management Planned: LMA  Additional Equipment: None  Intra-op Plan:   Post-operative Plan:   Informed Consent: I have reviewed the patients History and Physical, chart, labs and discussed the procedure including the risks, benefits and alternatives for the proposed anesthesia with the patient or authorized representative who has indicated his/her understanding and acceptance.     Dental advisory given  Plan Discussed with: CRNA and Anesthesiologist  Anesthesia Plan Comments:        Anesthesia Quick Evaluation

## 2020-07-19 ENCOUNTER — Other Ambulatory Visit: Payer: Self-pay

## 2020-07-19 ENCOUNTER — Encounter (HOSPITAL_BASED_OUTPATIENT_CLINIC_OR_DEPARTMENT_OTHER): Payer: Self-pay | Admitting: Radiation Oncology

## 2020-07-19 ENCOUNTER — Encounter (HOSPITAL_BASED_OUTPATIENT_CLINIC_OR_DEPARTMENT_OTHER)
Admission: RE | Disposition: A | Discharge status: Other Acute Inpatient Hospital | Payer: Self-pay | Source: Home / Self Care | Attending: Radiation Oncology

## 2020-07-19 ENCOUNTER — Encounter: Payer: Self-pay | Admitting: Radiation Oncology

## 2020-07-19 ENCOUNTER — Ambulatory Visit (HOSPITAL_BASED_OUTPATIENT_CLINIC_OR_DEPARTMENT_OTHER): Payer: Managed Care, Other (non HMO) | Admitting: Certified Registered Nurse Anesthetist

## 2020-07-19 ENCOUNTER — Ambulatory Visit
Admission: RE | Admit: 2020-07-19 | Discharge: 2020-07-19 | Disposition: A | Discharge status: Home / Self Care | Payer: Managed Care, Other (non HMO) | Source: Ambulatory Visit | Attending: Radiation Oncology | Admitting: Radiation Oncology

## 2020-07-19 ENCOUNTER — Ambulatory Visit (HOSPITAL_BASED_OUTPATIENT_CLINIC_OR_DEPARTMENT_OTHER)
Admission: RE | Admit: 2020-07-19 | Discharge: 2020-07-19 | Disposition: A | Discharge status: Other Acute Inpatient Hospital | Payer: Managed Care, Other (non HMO) | Attending: Radiation Oncology | Admitting: Radiation Oncology

## 2020-07-19 ENCOUNTER — Ambulatory Visit (HOSPITAL_COMMUNITY)
Admission: RE | Admit: 2020-07-19 | Discharge: 2020-07-19 | Disposition: A | Discharge status: Home / Self Care | Payer: Managed Care, Other (non HMO) | Source: Ambulatory Visit | Attending: Radiation Oncology | Admitting: Radiation Oncology

## 2020-07-19 DIAGNOSIS — Z880 Allergy status to penicillin: Secondary | ICD-10-CM | POA: Diagnosis not present

## 2020-07-19 DIAGNOSIS — C7951 Secondary malignant neoplasm of bone: Secondary | ICD-10-CM | POA: Insufficient documentation

## 2020-07-19 DIAGNOSIS — C539 Malignant neoplasm of cervix uteri, unspecified: Secondary | ICD-10-CM

## 2020-07-19 HISTORY — PX: OPERATIVE ULTRASOUND: SHX5996

## 2020-07-19 HISTORY — PX: TANDEM RING INSERTION: SHX6199

## 2020-07-19 SURGERY — INSERTION, UTERINE TANDEM AND RING OR CYLINDER, FOR BRACHYTHERAPY
Anesthesia: General | Site: Cervix

## 2020-07-19 MED ORDER — ONDANSETRON HCL 4 MG/2ML IJ SOLN
INTRAMUSCULAR | Status: AC
  Filled 2020-07-19: qty 2

## 2020-07-19 MED ORDER — OXYCODONE HCL 5 MG PO TABS: 5.0000 mg | ORAL_TABLET | Freq: Once | ORAL | Status: DC | PRN

## 2020-07-19 MED ORDER — HYDROMORPHONE HCL 1 MG/ML IJ SOLN
0.2500 mg | INTRAMUSCULAR | Status: DC | PRN
  Administered 2020-07-19 (×2): 0.25 mg via INTRAVENOUS

## 2020-07-19 MED ORDER — SODIUM CHLORIDE 0.9 % IR SOLN
Status: DC | PRN
  Administered 2020-07-19: 250 mL

## 2020-07-19 MED ORDER — OXYCODONE HCL 5 MG/5ML PO SOLN: 5.0000 mg | Freq: Once | ORAL | Status: DC | PRN

## 2020-07-19 MED ORDER — ONDANSETRON HCL 4 MG/2ML IJ SOLN: 4.0000 mg | Freq: Once | INTRAMUSCULAR | Status: DC | PRN

## 2020-07-19 MED ORDER — DEXAMETHASONE SODIUM PHOSPHATE 10 MG/ML IJ SOLN
INTRAMUSCULAR | Status: AC
  Filled 2020-07-19: qty 1

## 2020-07-19 MED ORDER — KETOROLAC TROMETHAMINE 30 MG/ML IJ SOLN
INTRAMUSCULAR | Status: AC
  Filled 2020-07-19: qty 1

## 2020-07-19 MED ORDER — FENTANYL CITRATE (PF) 100 MCG/2ML IJ SOLN
INTRAMUSCULAR | Status: DC | PRN
  Administered 2020-07-19 (×2): 50 ug via INTRAVENOUS

## 2020-07-19 MED ORDER — LIDOCAINE 2% (20 MG/ML) 5 ML SYRINGE
INTRAMUSCULAR | Status: DC | PRN
  Administered 2020-07-19: 80 mg via INTRAVENOUS

## 2020-07-19 MED ORDER — PHENYLEPHRINE 40 MCG/ML (10ML) SYRINGE FOR IV PUSH (FOR BLOOD PRESSURE SUPPORT)
PREFILLED_SYRINGE | INTRAVENOUS | Status: AC
  Filled 2020-07-19: qty 10

## 2020-07-19 MED ORDER — PHENYLEPHRINE 40 MCG/ML (10ML) SYRINGE FOR IV PUSH (FOR BLOOD PRESSURE SUPPORT)
PREFILLED_SYRINGE | INTRAVENOUS | Status: DC | PRN
  Administered 2020-07-19: 40 ug via INTRAVENOUS
  Administered 2020-07-19: 80 ug via INTRAVENOUS

## 2020-07-19 MED ORDER — KETOROLAC TROMETHAMINE 30 MG/ML IJ SOLN: 30.0000 mg | Freq: Once | INTRAMUSCULAR | Status: DC | PRN

## 2020-07-19 MED ORDER — MIDAZOLAM HCL 2 MG/2ML IJ SOLN
INTRAMUSCULAR | Status: AC
  Filled 2020-07-19: qty 2

## 2020-07-19 MED ORDER — LACTATED RINGERS IV SOLN
INTRAVENOUS | Status: DC
  Administered 2020-07-19: 50 mL via INTRAVENOUS

## 2020-07-19 MED ORDER — POVIDONE-IODINE 10 % EX SWAB: 2.0000 "application " | Freq: Once | CUTANEOUS | Status: DC

## 2020-07-19 MED ORDER — HYDROMORPHONE HCL 1 MG/ML IJ SOLN
INTRAMUSCULAR | Status: AC
  Filled 2020-07-19: qty 1

## 2020-07-19 MED ORDER — ONDANSETRON HCL 4 MG/2ML IJ SOLN
INTRAMUSCULAR | Status: DC | PRN
  Administered 2020-07-19: 4 mg via INTRAVENOUS

## 2020-07-19 MED ORDER — FENTANYL CITRATE (PF) 100 MCG/2ML IJ SOLN
INTRAMUSCULAR | Status: AC
  Filled 2020-07-19: qty 2

## 2020-07-19 MED ORDER — PROPOFOL 10 MG/ML IV BOLUS
INTRAVENOUS | Status: DC | PRN
  Administered 2020-07-19: 50 mg via INTRAVENOUS
  Administered 2020-07-19: 150 mg via INTRAVENOUS

## 2020-07-19 MED ORDER — MIDAZOLAM HCL 5 MG/5ML IJ SOLN
INTRAMUSCULAR | Status: DC | PRN
  Administered 2020-07-19: 2 mg via INTRAVENOUS

## 2020-07-19 MED ORDER — PROPOFOL 10 MG/ML IV BOLUS
INTRAVENOUS | Status: AC
  Filled 2020-07-19: qty 20

## 2020-07-19 MED ORDER — LIDOCAINE HCL (PF) 2 % IJ SOLN
INTRAMUSCULAR | Status: AC
  Filled 2020-07-19: qty 5

## 2020-07-19 MED ORDER — KETOROLAC TROMETHAMINE 30 MG/ML IJ SOLN
INTRAMUSCULAR | Status: DC | PRN
  Administered 2020-07-19: 30 mg via INTRAVENOUS

## 2020-07-19 MED ORDER — DEXAMETHASONE SODIUM PHOSPHATE 10 MG/ML IJ SOLN
INTRAMUSCULAR | Status: DC | PRN
  Administered 2020-07-19: 10 mg via INTRAVENOUS

## 2020-07-19 SURGICAL SUPPLY — 16 items
COVER WAND RF STERILE (DRAPES) ×3 IMPLANT
DRSG PAD ABDOMINAL 8X10 ST (GAUZE/BANDAGES/DRESSINGS) ×3 IMPLANT
GAUZE 4X4 16PLY RFD (DISPOSABLE) ×3 IMPLANT
GLOVE BIO SURGEON STRL SZ7.5 (GLOVE) ×6 IMPLANT
GLOVE SURG UNDER POLY LF SZ7 (GLOVE) ×9 IMPLANT
GOWN STRL REUS W/TWL LRG LVL3 (GOWN DISPOSABLE) ×6 IMPLANT
HOLDER FOLEY CATH W/STRAP (MISCELLANEOUS) ×3 IMPLANT
IV NS 1000ML (IV SOLUTION) ×3
IV NS 1000ML BAXH (IV SOLUTION) ×2 IMPLANT
IV SET EXTENSION GRAVITY 40 LF (IV SETS) ×3 IMPLANT
KIT TURNOVER CYSTO (KITS) ×3 IMPLANT
MAT PREVALON FULL STRYKER (MISCELLANEOUS) ×3 IMPLANT
NS IRRIG 500ML POUR BTL (IV SOLUTION) ×3 IMPLANT
PACK VAGINAL MINOR WOMEN LF (CUSTOM PROCEDURE TRAY) ×3 IMPLANT
TOWEL OR 17X26 10 PK STRL BLUE (TOWEL DISPOSABLE) ×3 IMPLANT
TRAY FOLEY W/BAG SLVR 14FR LF (SET/KITS/TRAYS/PACK) ×3 IMPLANT

## 2020-07-19 NOTE — Op Note (Signed)
07/19/2020  8:34 AM  PATIENT:  Phyllis Hebert  65 y.o. female  PRE-OPERATIVE DIAGNOSIS:  CERVICAL CANCER  POST-OPERATIVE DIAGNOSIS:  CERVICAL CANCER  PROCEDURE:  Procedure(s): TANDEM RING INSERTION (N/A) OPERATIVE ULTRASOUND (N/A)  SURGEON:  Surgeon(s) and Role:    * Gery Pray, MD - Primary  PHYSICIAN ASSISTANT:   ASSISTANTS: none   ANESTHESIA:   general  EBL:  5 mL   BLOOD ADMINISTERED:none  DRAINS: Urinary Catheter (Foley)   LOCAL MEDICATIONS USED:  NONE  SPECIMEN:  No Specimen  DISPOSITION OF SPECIMEN:  N/A  COUNTS:  YES  TOURNIQUET:  * No tourniquets in log *  DICTATION: Patient was taken to outpatient OR #4. Timeout was performed for the estimated length of procedure, procedure, preoperative antibiotics, allergies. Patient was prepped and draped in the usual sterile fashion and placed in the dorsolithotomy position. A Foley catheter was placed without difficulty. Clear yellowish urine was obtained. Patient then underwent exam under anesthesia,based on intraoperative exam and ultrasound findings the cervical mass was approximately 3.0 x 2.6 cm, a significant decrease in size compared to pretreatment.  There was some sloughing of tissue along the cervix area from her previous radiation treatments. The cervical os was flush with the upper vaginal area. Significant induration throughout the pelvis area was noted from her prior external beam and intracavitary treatments. the cervical sleeve placed last week came out with removal of the equipment last week and therefore a new cervical sleeve was placed after dilation of the cervical os and sounding of the uterus. Initial ultrasound imaging after instillation of approximately 250 cc of sterile water in the bladder were excellent. the patient then had placement of a 60 mm,60degree tandemwithin the endometrial cavity and cervical region. Attached to this device was a60 degree cervical ringwith a small  shielding cap in place. The patient then had placement of a rectal paddle posteriorly to limit dose to the rectal mucosa.Ultrasound at completion of placement of the tandem ring showed excellent position within the central uterus and endocervical canal. ThePatient tolerated the procedure well was transferred for to the recovery room in stable condition. Later in the day the patient will be transferred to radiation oncology for simulation, planning and herfifthhigh-dose-rate treatment. Patient is to receive a total of 5 high-dose-rate treatments with estimated dose per treatment to the high risk clinical target volume of 5.5 Gy. Iridium 192 will be the high-dose-rate source.  PLAN OF CARE: Transferred to radiation oncology for planning and treatment  PATIENT DISPOSITION:  PACU - hemodynamically stable.   Delay start of Pharmacological VTE agent (>24hrs) due to surgical blood loss or risk of bleeding: not applicable

## 2020-07-19 NOTE — Interval H&P Note (Signed)
History and Physical Interval Note:  07/19/2020 7:31 AM  Phyllis Hebert  has presented today for surgery, with the diagnosis of CERVICAL CANCER.  The various methods of treatment have been discussed with the patient and family. After consideration of risks, benefits and other options for treatment, the patient has consented to  Procedure(s): TANDEM RING INSERTION (N/A) OPERATIVE ULTRASOUND (N/A) as a surgical intervention.  The patient's history has been reviewed, patient examined, no change in status, stable for surgery.  I have reviewed the patient's chart and labs.  Questions were answered to the patient's satisfaction.     Gery Pray

## 2020-07-19 NOTE — Progress Notes (Addendum)
  Radiation Oncology         [phone] ________________________________  Name: SHALYNN JORSTAD MRN: [id number]  Date: 07/19/2020  DOB: 28-Aug-1955  CC: [name], MD  [name], MD  HDR BRACHYTHERAPY NOTE  DIAGNOSIS: Stage IVB (cT3b, cN1a, cM1) adenocarcinoma of the cervix  Simple treatment device note: In the operating room the patient had construction of her custom tandem ring system. She will be treated with a 60 tandem/ring system with a 6.0 cm  tandem. This conforms to her anatomy without undue discomfort. A rectal paddle was also part of her custom set up device.  Vaginal brachytherapy procedure node: The patient was brought to the [place] suite. Identity was confirmed. All relevant records and images related to the planned course of therapy were reviewed. The patient freely provided informed written consent to proceed with treatment after reviewing the details related to the planned course of therapy. The consent form was witnessed and verified by the simulation staff. Then, the patient was set-up in a stable reproducible supine position for radiation therapy. The tandem cylinder system was accessed and fiducial markers were placed within the tandem and cylinder.   Verification simulation note: An AP and lateral film was obtained through the pelvis area. This was compared to the patient's planning films documenting accurate position of the tandem/cylinder system for treatment.  High-dose-rate brachytherapy treatment note:   The remote afterloading device was accessed through catheter system and attached to the tandem ring system. Patient then proceeded to undergo her fifth high-dose-rate treatment directed at the cervix. The patient was prescribed a dose of 5.5 gray to be delivered to the high risk clinical target volume.  Patient was treated with 2 channels using 24 dwell positions. Treatment time was 291.6 seconds. The patient tolerated the procedure well. After  completion of her therapy, a radiation survey was performed documenting return of the iridium source into the [employer] safe. The patient was then transferred to the nursing suite.  She then had removal of the rectal paddle followed by the tandem ring system. The patient tolerated the removal well. Minimal bleeding noted.  Cervical sleeve was removed at the time of the implant removal.  PLAN: The patient will return for routine follow-up in one month.  She has completed her course of radiation therapy including external beam and intracavitary brachytherapy treatments.  Patient is scheduled to begin full dose chemotherapy February 9. ________________________________     Blair Promise, PhD, MD  This document serves as a record of services personally performed by Gery Pray, MD. It was created on his behalf by Clerance Lav, a trained medical scribe. The creation of this record is based on the scribe's personal observations and the provider's statements to them. This document has been checked and approved by the attending provider.

## 2020-07-19 NOTE — Anesthesia Postprocedure Evaluation (Signed)
Anesthesia Post Note  Patient: Phyllis Hebert  Procedure(s) Performed: TANDEM RING INSERTION (N/A Cervix) OPERATIVE ULTRASOUND (N/A Abdomen)     Patient location during evaluation: PACU Anesthesia Type: General Level of consciousness: awake and alert Pain management: pain level controlled Vital Signs Assessment: post-procedure vital signs reviewed and stable Respiratory status: spontaneous breathing, nonlabored ventilation, respiratory function stable and patient connected to nasal cannula oxygen Cardiovascular status: blood pressure returned to baseline and stable Postop Assessment: no apparent nausea or vomiting Anesthetic complications: no   No complications documented.  Last Vitals:  Vitals:   07/19/20 0900 07/19/20 0905  BP: 124/74   Pulse: 65 70  Resp: 14 15  Temp:    SpO2: 93% 95%    Last Pain:  Vitals:   07/19/20 0856  TempSrc:   PainSc: 2                  Barnet Glasgow

## 2020-07-19 NOTE — Transfer of Care (Signed)
Immediate Anesthesia Transfer of Care Note  Patient: Phyllis Hebert  Procedure(s) Performed: TANDEM RING INSERTION (N/A Cervix) OPERATIVE ULTRASOUND (N/A Abdomen)  Patient Location: PACU  Anesthesia Type:General  Level of Consciousness: awake, alert , oriented and patient cooperative  Airway & Oxygen Therapy: Patient Spontanous Breathing  Post-op Assessment: Report given to RN and Post -op Vital signs reviewed and stable  Post vital signs: Reviewed and stable  Last Vitals:  Vitals Value Taken Time  BP 150/85 07/19/20 0815  Temp 36.6 C 07/19/20 0815  Pulse 74 07/19/20 0818  Resp 16 07/19/20 0818  SpO2 96 % 07/19/20 0818  Vitals shown include unvalidated device data.  Last Pain:  Vitals:   07/19/20 0554  TempSrc: Oral  PainSc: 0-No pain      Patients Stated Pain Goal: 5 (59/74/16 [id number])  Complications: No complications documented.

## 2020-07-19 NOTE — Discharge Instructions (Signed)

## 2020-07-19 NOTE — Anesthesia Procedure Notes (Signed)
Procedure Name: LMA Insertion Date/Time: 07/19/2020 7:39 AM Performed by: Rogers Blocker, CRNA Pre-anesthesia Checklist: Patient identified, Emergency Drugs available, Suction available and Patient being monitored Patient Re-evaluated:Patient Re-evaluated prior to induction Oxygen Delivery Method: Circle System Utilized Preoxygenation: Pre-oxygenation with 100% oxygen Induction Type: IV induction Ventilation: Mask ventilation without difficulty LMA: LMA inserted LMA Size: 4.0 Number of attempts: 1 Placement Confirmation: positive ETCO2 Tube secured with: Tape Dental Injury: Teeth and Oropharynx as per pre-operative assessment

## 2020-07-20 ENCOUNTER — Encounter (HOSPITAL_BASED_OUTPATIENT_CLINIC_OR_DEPARTMENT_OTHER): Payer: Self-pay | Admitting: Radiation Oncology

## 2020-07-21 NOTE — Progress Notes (Signed)
Pharmacist Chemotherapy Monitoring - Initial Assessment    Anticipated start date: 07/28/20   Regimen:  . Are orders appropriate based on the patient's diagnosis, regimen, and cycle? Yes . Does the plan date match the patient's scheduled date? Yes . Is the sequencing of drugs appropriate? Yes . Are the premedications appropriate for the patient's regimen? Yes . Prior Authorization for treatment is: Pending o If applicable, is the correct biosimilar selected based on the patient's insurance? yes  Organ Function and Labs: Marland Kitchen Are dose adjustments needed based on the patient's renal function, hepatic function, or hematologic function? No . Are appropriate labs ordered prior to the start of patient's treatment? Yes . Other organ system assessment, if indicated: N/A . The following baseline labs, if indicated, have been ordered: pembrolizumab: baseline TSH +/- T4 and bevacizumab: urine protein  Dose Assessment: . Are the drug doses appropriate? Yes . Are the following correct: o Drug concentrations Yes o IV fluid compatible with drug Yes o Administration routes Yes o Timing of therapy Yes . If applicable, does the patient have documented access for treatment and/or plans for port-a-cath placement? yes . If applicable, have lifetime cumulative doses been properly documented and assessed? not applicable Lifetime Dose Tracking  No doses have been documented on this patient for the following tracked chemicals: Doxorubicin, Epirubicin, Idarubicin, Daunorubicin, Mitoxantrone, Bleomycin, Oxaliplatin, Carboplatin, Liposomal Doxorubicin  o   Toxicity Monitoring/Prevention: . The patient has the following take home antiemetics prescribed: Ondansetron, Prochlorperazine, Dexamethasone and Lorazepam . The patient has the following take home medications prescribed: N/A . Medication allergies and previous infusion related reactions, if applicable, have been reviewed and addressed. Yes . The patient's  current medication list has been assessed for drug-drug interactions with their chemotherapy regimen. no significant drug-drug interactions were identified on review.  Order Review: . Are the treatment plan orders signed? Yes . Is the patient scheduled to see a provider prior to their treatment? Yes  I verify that I have reviewed each item in the above checklist and answered each question accordingly.  @RPHNAME @

## 2020-07-22 ENCOUNTER — Other Ambulatory Visit: Payer: Self-pay | Admitting: Hematology and Oncology

## 2020-07-26 ENCOUNTER — Other Ambulatory Visit: Payer: Self-pay | Admitting: Hematology and Oncology

## 2020-07-26 ENCOUNTER — Telehealth: Payer: Self-pay | Admitting: Oncology

## 2020-07-26 NOTE — Telephone Encounter (Signed)
Called [facility] regarding PD-L1 testing.  Faxed request to Angie at [phone] on specimen ID [id number].

## 2020-07-27 ENCOUNTER — Inpatient Hospital Stay (HOSPITAL_BASED_OUTPATIENT_CLINIC_OR_DEPARTMENT_OTHER): Payer: Managed Care, Other (non HMO) | Admitting: Hematology and Oncology

## 2020-07-27 ENCOUNTER — Other Ambulatory Visit: Payer: Self-pay

## 2020-07-27 ENCOUNTER — Inpatient Hospital Stay: Payer: Managed Care, Other (non HMO)

## 2020-07-27 ENCOUNTER — Inpatient Hospital Stay: Payer: Managed Care, Other (non HMO) | Attending: Gynecologic Oncology

## 2020-07-27 ENCOUNTER — Inpatient Hospital Stay: Payer: Managed Care, Other (non HMO) | Admitting: Hematology and Oncology

## 2020-07-27 ENCOUNTER — Encounter (HOSPITAL_COMMUNITY)
Admission: RE | Admit: 2020-07-27 | Discharge: 2020-07-27 | Disposition: A | Discharge status: Home / Self Care | Payer: Managed Care, Other (non HMO) | Source: Ambulatory Visit | Attending: Hematology and Oncology | Admitting: Hematology and Oncology

## 2020-07-27 ENCOUNTER — Other Ambulatory Visit: Payer: Managed Care, Other (non HMO)

## 2020-07-27 ENCOUNTER — Telehealth: Payer: Self-pay | Admitting: Hematology and Oncology

## 2020-07-27 ENCOUNTER — Encounter: Payer: Self-pay | Admitting: Hematology and Oncology

## 2020-07-27 DIAGNOSIS — K219 Gastro-esophageal reflux disease without esophagitis: Secondary | ICD-10-CM | POA: Diagnosis not present

## 2020-07-27 DIAGNOSIS — R110 Nausea: Secondary | ICD-10-CM | POA: Insufficient documentation

## 2020-07-27 DIAGNOSIS — R5383 Other fatigue: Secondary | ICD-10-CM | POA: Insufficient documentation

## 2020-07-27 DIAGNOSIS — N1330 Unspecified hydronephrosis: Secondary | ICD-10-CM

## 2020-07-27 DIAGNOSIS — Z79899 Other long term (current) drug therapy: Secondary | ICD-10-CM | POA: Insufficient documentation

## 2020-07-27 DIAGNOSIS — C7951 Secondary malignant neoplasm of bone: Secondary | ICD-10-CM | POA: Diagnosis not present

## 2020-07-27 DIAGNOSIS — C775 Secondary and unspecified malignant neoplasm of intrapelvic lymph nodes: Secondary | ICD-10-CM | POA: Insufficient documentation

## 2020-07-27 DIAGNOSIS — C539 Malignant neoplasm of cervix uteri, unspecified: Secondary | ICD-10-CM | POA: Diagnosis not present

## 2020-07-27 DIAGNOSIS — C7800 Secondary malignant neoplasm of unspecified lung: Secondary | ICD-10-CM | POA: Insufficient documentation

## 2020-07-27 DIAGNOSIS — E039 Hypothyroidism, unspecified: Secondary | ICD-10-CM

## 2020-07-27 DIAGNOSIS — Z5111 Encounter for antineoplastic chemotherapy: Secondary | ICD-10-CM | POA: Insufficient documentation

## 2020-07-27 LAB — CMP (CANCER CENTER ONLY)
ALT: 15 U/L (ref 0–44)
AST: 12 U/L — ABNORMAL LOW (ref 15–41)
Albumin: 3.7 g/dL (ref 3.5–5.0)
Alkaline Phosphatase: 52 U/L (ref 38–126)
Anion gap: 8 (ref 5–15)
BUN: 20 mg/dL (ref 8–23)
CO2: 25 mmol/L (ref 22–32)
Calcium: 9.4 mg/dL (ref 8.9–10.3)
Chloride: 109 mmol/L (ref 98–111)
Creatinine: 1.33 mg/dL — ABNORMAL HIGH (ref 0.44–1.00)
GFR, Estimated: 45 mL/min — ABNORMAL LOW (ref >60)
Glucose, Bld: 98 mg/dL (ref 70–99)
Potassium: 3.5 mmol/L (ref 3.5–5.1)
Sodium: 142 mmol/L (ref 135–145)
Total Bilirubin: 0.4 mg/dL (ref 0.3–1.2)
Total Protein: 6.8 g/dL (ref 6.5–8.1)

## 2020-07-27 LAB — CBC WITH DIFFERENTIAL (CANCER CENTER ONLY)
Abs Immature Granulocytes: 0.05 10*3/uL (ref 0.00–0.07)
Basophils Absolute: 0 10*3/uL (ref 0.0–0.1)
Basophils Relative: 0 %
Eosinophils Absolute: 0.1 10*3/uL (ref 0.0–0.5)
Eosinophils Relative: 1 %
HCT: 31.6 % — ABNORMAL LOW (ref 36.0–46.0)
Hemoglobin: 10.2 g/dL — ABNORMAL LOW (ref 12.0–15.0)
Immature Granulocytes: 1 %
Lymphocytes Relative: 11 %
Lymphs Abs: 0.6 10*3/uL — ABNORMAL LOW (ref 0.7–4.0)
MCH: 30.6 pg (ref 26.0–34.0)
MCHC: 32.3 g/dL (ref 30.0–36.0)
MCV: 94.9 fL (ref 80.0–100.0)
Monocytes Absolute: 0.5 10*3/uL (ref 0.1–1.0)
Monocytes Relative: 10 %
Neutro Abs: 4 10*3/uL (ref 1.7–7.7)
Neutrophils Relative %: 77 %
Platelet Count: 171 10*3/uL (ref 150–400)
RBC: 3.33 MIL/uL — ABNORMAL LOW (ref 3.87–5.11)
RDW: 17.3 % — ABNORMAL HIGH (ref 11.5–15.5)
WBC Count: 5.2 10*3/uL (ref 4.0–10.5)
nRBC: 0 % (ref 0.0–0.2)

## 2020-07-27 LAB — TSH: TSH: 1.118 u[IU]/mL (ref 0.308–3.960)

## 2020-07-27 LAB — GLUCOSE, CAPILLARY: Glucose-Capillary: 104 mg/dL — ABNORMAL HIGH (ref 70–99)

## 2020-07-27 LAB — MAGNESIUM: Magnesium: 2 mg/dL (ref 1.7–2.4)

## 2020-07-27 MED ORDER — FLUDEOXYGLUCOSE F - 18 (FDG) INJECTION
10.6100 | Freq: Once | INTRAVENOUS | Status: AC | PRN
  Administered 2020-07-27: 10.61 via INTRAVENOUS

## 2020-07-27 NOTE — Assessment & Plan Note (Signed)
I have reviewed PET CT imaging with the patient and her husband Unfortunately, she has slight worsening hydronephrosis on the left, enlarging lung nodules and lymphadenopathy Overall, she is not symptomatic We will proceed with treatment as scheduled We will monitor her renal function carefully and adjust the dose of carboplatin accordingly My original plan would be to proceed with combination treatment of carboplatin, paclitaxel, pembrolizumab along with bevacizumab However, we are not able to get pembrolizumab approved and we would do without I will get PD-L1 testing added to her previous sample to see if we can get pembrolizumab approved in the future She is aware of expected side effects of treatment She is advised to check her blood pressure twice a day while on bevacizumab I will check on her next week for further follow-up

## 2020-07-27 NOTE — Assessment & Plan Note (Signed)
The abnormal changes seen in her stomach is likely due to reflux She is advised to continue proton pump inhibitor and to add calcium supplement.

## 2020-07-27 NOTE — Assessment & Plan Note (Signed)
This is slightly worse but her renal function is stable Observe closely for now

## 2020-07-27 NOTE — Progress Notes (Signed)
[place] OFFICE PROGRESS NOTE  Patient Care Team: [name], MD as PCP - General (Family Medicine) [name], MD as Consulting Physician (Obstetrics and Gynecology) [name], RN as Oncology Nurse Navigator (Oncology)  ASSESSMENT & PLAN:  Cervical cancer, FIGO stage IVB ([place]) I have reviewed PET CT imaging with the patient and her husband Unfortunately, she has slight worsening hydronephrosis on the left, enlarging lung nodules and lymphadenopathy Overall, she is not symptomatic We will proceed with treatment as scheduled We will monitor her renal function carefully and adjust the dose of carboplatin accordingly My original plan would be to proceed with combination treatment of carboplatin, paclitaxel, pembrolizumab along with bevacizumab However, we are not able to get pembrolizumab approved and we would do without I will get PD-L1 testing added to her previous sample to see if we can get pembrolizumab approved in the future She is aware of expected side effects of treatment She is advised to check her blood pressure twice a day while on bevacizumab I will check on her next week for further follow-up  Hydronephrosis of left kidney This is slightly worse but her renal function is stable Observe closely for now  Metastasis to bone [facility]) This is improved through radiation She has dental clearance recently We will proceed with Zometa She is aware about side effects of Zometa She is advised to continue vitamin D and add calcium supplement  GERD (gastroesophageal reflux disease) The abnormal changes seen in her stomach is likely due to reflux She is advised to continue proton pump inhibitor and to add calcium supplement.   No orders of the defined types were placed in this encounter.   All questions were answered. The patient knows to call the clinic with any problems, questions or concerns. The total time spent in the appointment was 40  minutes encounter with patients including review of chart and various tests results, discussions about plan of care and coordination of care plan   [name], MD 07/27/2020 2:27 PM  INTERVAL HISTORY: Please see below for problem oriented charting. She returns for further follow-up She has completed dental work-up and obtain clearance She denies bone pain Appetite is fair She continues to have mild intermittent discharge  SUMMARY OF ONCOLOGIC HISTORY: Oncology History  Cervical cancer, FIGO stage IVB ([place])  03/16/2020 Initial Diagnosis   The patient had history of new onset vaginal spotting on March 16, 2020.  She immediately called her OB/GYN's office and achieved an appointment with her OB/GYN's partner, Dr. Murrell Redden, who saw and evaluated the patient on March 19, 2020.  At that time a transvaginal ultrasound scan was performed which revealed a uterus measuring 5.3 x 8.4 x 4.6 cm with an ill-defined endometrium.  There is a complex cyst on the left ovary measuring 2.6 cm with peripheral blood flow noted.  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  Immunostains were positive for CEA and CDX2, negative for p16, ER, vimentin, and PAX8.  The differential diagnosis included primary gastric type endocervical adenocarcinoma (though this typically stains positive for PAX8) or a metastasis from pancreaticobiliary or upper GI primary.   03/29/2020 Pathology Results   1. Surgical [P], duodenum - PEPTIC DUODENITIS. - NO DYSPLASIA OR MALIGNANCY. 2. Surgical [P], gastric antrum and gastric body - REACTIVE GASTROPATHY. [place] IS NEGATIVE FOR HELICOBACTER PYLORI. - NO INTESTINAL METAPLASIA, DYSPLASIA, OR MALIGNANCY. 3. Surgical [P], gastric nodule -  MILD REACTIVE GASTROPATHY. - NO INTESTINAL METAPLASIA, DYSPLASIA, OR MALIGNANCY. 4. Surgical [P], stomach, lesser curve  ulcers - REACTIVE GASTROPATHY WITH EROSIONS. - NO INTESTINAL METAPLASIA, DYSPLASIA, OR MALIGNANCY. 5. Surgical [P], gastric polyps - HYPERPLASTIC POLYP. - NO INTESTINAL METAPLASIA, DYSPLASIA OR MALIGNANCY. 6. Surgical [P], colon, descending, polyp - HYPERPLASTIC POLYP. - NO DYSPLASIA OR MALIGNANCY.   04/06/2020 Procedure   EGD report  Normal esophagus. - A few gastric polyps. Suspected fundic gland polyps. - Non-bleeding gastric ulcers with pigmented material. Biopsied. - A single gastric polyp/nodule. Resected and retrieved. - Erythematous duodenopathy. Biopsied. - The examination was otherwise normal.   04/06/2020 Procedure   Colonoscopy report - Non-bleeding internal hemorrhoids. - One 4 mm polyp in the descending colon, removed with a cold snare. Resected and retrieved. - The examination was otherwise normal on direct and retroflexion views   04/19/2020 PET scan   1. Hypermetabolic uterine cervix mass compatible with known primary cervical malignancy, with hypermetabolism extending into the upper third of the vagina and extending throughout the uterine body. No frank extrauterine extension. 2. Mild to moderate left hydroureteronephrosis to the level of the left UVJ, concerning for tumor involvement of the left UVJ. 3. Hypermetabolic right common iliac nodal metastasis. 4. Hypermetabolic sclerotic right ischial bone metastasis. 5. Two indeterminate small scattered solid pulmonary nodules, below PET resolution, warranting attention on chest CT follow-up in 3 months. 6.  Aortic Atherosclerosis (ICD10-I70.0).   04/19/2020 Cancer Staging   Staging form: Cervix Uteri, AJCC Version 9 - Clinical stage from 04/19/2020: FIGO Stage IVB (cT3b, cN1a, cM1) - Signed by [name], MD on 04/19/2020   04/26/2020 Procedure   Successful placement of a right IJ approach Power Port with ultrasound and fluoroscopic guidance. The catheter is ready for use   04/30/2020 -  Chemotherapy   The patient  had cisplatin for chemotherapy treatment.     06/20/2020 Genetic Testing   Negative genetic testing on the CancerNext-Expanded+RNAinsight panel.  The CancerNext-Expanded gene panel offered by [facility] and includes sequencing and rearrangement analysis for the following 77 genes: AIP, ALK, APC*, ATM*, AXIN2, BAP1, BARD1, BLM, BMPR1A, BRCA1*, BRCA2*, BRIP1*, CDC73, CDH1*, CDK4, CDKN1B, CDKN2A, CHEK2*, CTNNA1, DICER1, FANCC, FH, FLCN, GALNT12, KIF1B, LZTR1, MAX, MEN1, MET, MLH1*, MSH2*, MSH3, MSH6*, MUTYH*, NBN, NF1*, NF2, NTHL1, PALB2*, PHOX2B, PMS2*, POT1, PRKAR1A, PTCH1, PTEN*, RAD51C*, RAD51D*, RB1, RECQL, RET, SDHA, SDHAF2, SDHB, SDHC, SDHD, SMAD4, SMARCA4, SMARCB1, SMARCE1, STK11, SUFU, TMEM127, TP53*, TSC1, TSC2, VHL and XRCC2 (sequencing and deletion/duplication); EGFR, EGLN1, HOXB13, KIT, MITF, PDGFRA, POLD1, and POLE (sequencing only); EPCAM and GREM1 (deletion/duplication only). DNA and RNA analyses performed for * genes. The report date is June 20, 2020.   07/28/2020 -  Chemotherapy    Patient is on Treatment Plan: CERVICAL PEMBROLIZUMAB/CARBOPLATIN + PACLITAXEL + BEVACIZUMAB Q21D       Metastasis to bone ([place])  04/19/2020 Initial Diagnosis   Metastasis to bone ([place])   07/28/2020 -  Chemotherapy    Patient is on Treatment Plan: CERVICAL PEMBROLIZUMAB/CARBOPLATIN + PACLITAXEL + BEVACIZUMAB Q21D         REVIEW OF SYSTEMS:   Constitutional: Denies fevers, chills or abnormal weight loss Eyes: Denies blurriness of vision Ears, nose, mouth, throat, and face: Denies mucositis or sore throat Respiratory: Denies cough, dyspnea or wheezes Cardiovascular: Denies palpitation, chest discomfort or lower extremity swelling Gastrointestinal:  Denies nausea, heartburn or change in bowel habits Skin: Denies abnormal skin rashes Lymphatics: Denies new lymphadenopathy or easy bruising Neurological:Denies numbness, tingling or new weaknesses Behavioral/Psych: Mood  is stable, no new changes  All  other systems were reviewed with the patient and are negative.  I have reviewed the past medical history, past surgical history, social history and family history with the patient and they are unchanged from previous note.  ALLERGIES:  is allergic to penicillins.  MEDICATIONS:  Current Outpatient Medications  Medication Sig Dispense Refill  . calcium carbonate (TUMS - DOSED IN MG ELEMENTAL CALCIUM) 500 MG chewable tablet Chew 1 tablet by mouth 2 (two) times daily.    Marland Kitchen dexamethasone (DECADRON) 4 MG tablet Take 4 mg by mouth as directed.    . Cholecalciferol (VITAMIN D3) 2000 units TABS Take 2 tablets by mouth daily.    . clobetasol cream (TEMOVATE) 0.05 % Apply topically as needed. Left leg    . fenofibrate 160 MG tablet TAKE 1 TABLET BY MOUTH EVERY DAY (Patient taking differently: Take 160 mg by mouth daily with lunch.) 30 tablet 5  . lidocaine-prilocaine (EMLA) cream Apply to affected area once 30 g 3  . LORazepam (ATIVAN) 0.5 MG tablet Take 1 tablet (0.5 mg total) by mouth every 8 (eight) hours as needed for anxiety. 30 tablet 0  . ondansetron (ZOFRAN) 8 MG tablet Take 1 tablet (8 mg total) by mouth every 8 (eight) hours as needed. Start on the third day after cisplatin chemotherapy. 30 tablet 1  . pantoprazole (PROTONIX) 40 MG tablet Take 1 tablet (40 mg total) by mouth 2 (two) times daily. 90 tablet 3  . prochlorperazine (COMPAZINE) 10 MG tablet Take 1 tablet (10 mg total) by mouth every 6 (six) hours as needed (Nausea or vomiting). 30 tablet 1  . traMADol (ULTRAM) 50 MG tablet Take 2 tablets (100 mg total) by mouth every 6 (six) hours as needed. 60 tablet 0   No current facility-administered medications for this visit.    PHYSICAL EXAMINATION: ECOG PERFORMANCE STATUS: 1 - Symptomatic but completely ambulatory  Vitals:   07/27/20 1237  BP: 138/68  Pulse: 87  Resp: 17  Temp: 97.9 F (36.6 C)  SpO2: 100%   Filed Weights   07/27/20 1237  Weight: 214 lb 14.4 oz (97.5 kg)     GENERAL:alert, no distress and comfortable  LABORATORY DATA:  I have reviewed the data as listed    Component Value Date/Time   NA 142 07/27/2020 1212   NA 140 06/21/2016 1530   K 3.5 07/27/2020 1212   CL 109 07/27/2020 1212   CO2 25 07/27/2020 1212   GLUCOSE 98 07/27/2020 1212   BUN 20 07/27/2020 1212   BUN 16 06/21/2016 1530   CREATININE 1.33 (H) 07/27/2020 1212   CREATININE 0.87 07/02/2017 1549   CALCIUM 9.4 07/27/2020 1212   PROT 6.8 07/27/2020 1212   PROT 7.3 06/21/2016 1530   ALBUMIN 3.7 07/27/2020 1212   ALBUMIN 4.3 06/21/2016 1530   AST 12 (L) 07/27/2020 1212   ALT 15 07/27/2020 1212   ALKPHOS 52 07/27/2020 1212   BILITOT 0.4 07/27/2020 1212   GFRNONAA 45 (L) 07/27/2020 1212   GFRAA 111 06/21/2016 1530    No results found for: SPEP, UPEP  Lab Results  Component Value Date   WBC 5.2 07/27/2020   NEUTROABS 4.0 07/27/2020   HGB 10.2 (L) 07/27/2020   HCT 31.6 (L) 07/27/2020   MCV 94.9 07/27/2020   PLT 171 07/27/2020      Chemistry      Component Value Date/Time   NA 142 07/27/2020 1212   NA 140 06/21/2016 1530  K 3.5 07/27/2020 1212   CL 109 07/27/2020 1212   CO2 25 07/27/2020 1212   BUN 20 07/27/2020 1212   BUN 16 06/21/2016 1530   CREATININE 1.33 (H) 07/27/2020 1212   CREATININE 0.87 07/02/2017 1549      Component Value Date/Time   CALCIUM 9.4 07/27/2020 1212   ALKPHOS 52 07/27/2020 1212   AST 12 (L) 07/27/2020 1212   ALT 15 07/27/2020 1212   BILITOT 0.4 07/27/2020 1212       RADIOGRAPHIC STUDIES: I have reviewed multiple PET CT scan with the patient and her husband I have personally reviewed the radiological images as listed and agreed with the findings in the report. US PELVIS LIMITED (TRANSABDOMINAL ONLY)  Result Date: 07/02/2020 CLINICAL DATA:  Ultrasound was provided for use by the ordering physician.  No provider Interpretation or professional fees incurred.    [place] Intraoperative  Result Date: 07/19/2020 CLINICAL DATA:   Ultrasound was provided for use by the ordering physician.  No provider Interpretation or professional fees incurred.    [place] Intraoperative  Result Date: 07/14/2020 CLINICAL DATA:  Ultrasound was provided for use by the ordering physician.  No provider Interpretation or professional fees incurred.    [place] Intraoperative  Result Date: 07/06/2020 CLINICAL DATA:  Ultrasound was provided for use by the ordering physician.  No provider Interpretation or professional fees incurred.    [place] Intraoperative  Result Date: 07/02/2020 CLINICAL DATA:  Ultrasound was provided for use by the ordering physician.  No provider Interpretation or professional fees incurred.    NM PET Image Restage (PS) Skull Base to Thigh  Result Date: 07/27/2020 CLINICAL DATA:  Subsequent treatment strategy for cervical cancer. EXAM: NUCLEAR MEDICINE PET SKULL BASE TO THIGH TECHNIQUE: 10.6 mCi F-18 FDG was injected intravenously. Full-ring PET imaging was performed from the skull base to thigh after the radiotracer. CT data was obtained and used for attenuation correction and anatomic localization. Fasting blood glucose: 104 mg/dl COMPARISON:  04/19/2020 FINDINGS: Mediastinal blood pool activity: SUV max 3.0 Liver activity: SUV max NA NECK: No hypermetabolic lymph nodes in the neck. Incidental CT findings: none CHEST: New and enlarging bilateral pulmonary nodules are identified. 10 mm nodule in the suprahilar right upper lobe (image 63/series 4) is new since prior and demonstrates SUV max = 3.3. 11 mm subpleural nodule in the right apex (57/4) has increased from 6 mm previously. This nodule also shows low level FDG uptake with SUV max = 2.0. Remaining bilateral pulmonary nodules are tiny, below size threshold for reliable resolution on PET imaging. 6-7 mm medial right upper lobe nodule on 63/4 is new in the interval. 3 mm right lower lobe nodule on 79/4 is new since prior. 2-3 mm left upper lobe nodule on 69/4 is new since prior. Incidental  CT findings: Right Port-A-Cath tip is positioned in the upper right atrium. ABDOMEN/PELVIS: No abnormal hypermetabolic activity within the liver, pancreas, adrenal glands, or spleen. Punctate focus of hypermetabolism in the gastric fundus without underlying mass lesion evident. No hypermetabolic lymph nodes in the abdomen. Upper normal lymph nodes in the low left para-aortic region, near the bifurcationuu measure up to 8 mm short axis (image 138/4). These lymph nodes show hypermetabolism with SUV max = 8.5. 7 mm short axis retrocaval lymph node seen on image 768/1 is hypermetabolic with SUV max = 5.3. Punctate foci of hypermetabolism are seen in the right pelvic sidewall, along the course of the right ureter. This activity appears to be distinct/separate from the right  ureter and is suspicious for additional small hypermetabolic lymph nodes along the right common iliac chain (see 7 mm short axis node on 154/4). Hypermetabolism in the cervix persists but is decreased with SUV max = 6.5 today compared to 14.6 previously. Endometrial canal appears heterogeneous/irregular towards the uterine fundus, not well evaluated on noncontrast CT imaging. Incidental CT findings: Stable anterior hepatic cyst measured previously at 2.8 cm. Interval decrease in left adnexal cyst now measuring 14 mm on image 168/4. Tiny gas bubble identified in the bladder lumen, presumably secondary to recent instrumentation. The left hydroureteronephrosis seen previously is mildly progressive in the interval, again with dilatation down to the low right pelvis as the ureter passes by the cervix just proximal to the UVJ. SKELETON: Hypermetabolic right ischial lesion seen previously remains hypermetabolic today with SUV max = 4.3 today compared to 9.5 previously Incidental CT findings: none IMPRESSION: 1. Enlarging and new bilateral pulmonary nodules are evident today. Larger nodules in the right upper lung show FDG accumulation. Imaging features  compatible with metastatic disease. 2. New hypermetabolic lymph nodes identified in the retrocaval space of the upper abdomen and para-aortic space of the lower abdomen, consistent with new metastatic involvement. There is some persistent hypermetabolism associated with small lymph nodes along the right common iliac chain. 3. Interval decrease in hypermetabolism associated with the cervix. 4. Persistent, mildly progressive left hydroureteronephrosis. Left ureter is dilated down to the level it passes the cervix, just proximal to the UVJ. 5. Decreased hypermetabolism in the previously identified right ischial tuberosity lesion consistent with metastatic disease. 6. New punctate focus of hypermetabolism in the gastric fundus without underlying mass lesion evident by CT. Attention on follow-up recommended. 7. Tiny gas bubble in the bladder lumen is presumably secondary to recent instrumentation although bladder infection could have this appearance. Electronically Signed   By: Misty Stanley M.D.   On: 07/27/2020 12:55

## 2020-07-27 NOTE — Telephone Encounter (Signed)
Scheduled appts per 2/8 sch msg. Pt declined print out of AVS and stated she would refer to mychart.

## 2020-07-27 NOTE — Assessment & Plan Note (Signed)
This is improved through radiation She has dental clearance recently We will proceed with Zometa She is aware about side effects of Zometa She is advised to continue vitamin D and add calcium supplement

## 2020-07-28 ENCOUNTER — Inpatient Hospital Stay: Payer: Managed Care, Other (non HMO)

## 2020-07-28 ENCOUNTER — Other Ambulatory Visit: Payer: Self-pay | Admitting: Hematology and Oncology

## 2020-07-28 ENCOUNTER — Other Ambulatory Visit: Payer: Self-pay

## 2020-07-28 VITALS — BP 138/70 | HR 66 | Temp 98.3°F | Resp 18

## 2020-07-28 DIAGNOSIS — N1330 Unspecified hydronephrosis: Secondary | ICD-10-CM | POA: Diagnosis not present

## 2020-07-28 DIAGNOSIS — C539 Malignant neoplasm of cervix uteri, unspecified: Secondary | ICD-10-CM | POA: Diagnosis present

## 2020-07-28 DIAGNOSIS — C7951 Secondary malignant neoplasm of bone: Secondary | ICD-10-CM

## 2020-07-28 DIAGNOSIS — Z79899 Other long term (current) drug therapy: Secondary | ICD-10-CM | POA: Diagnosis not present

## 2020-07-28 DIAGNOSIS — K219 Gastro-esophageal reflux disease without esophagitis: Secondary | ICD-10-CM | POA: Diagnosis not present

## 2020-07-28 DIAGNOSIS — R5383 Other fatigue: Secondary | ICD-10-CM | POA: Diagnosis not present

## 2020-07-28 DIAGNOSIS — R110 Nausea: Secondary | ICD-10-CM | POA: Diagnosis not present

## 2020-07-28 DIAGNOSIS — C775 Secondary and unspecified malignant neoplasm of intrapelvic lymph nodes: Secondary | ICD-10-CM | POA: Diagnosis not present

## 2020-07-28 DIAGNOSIS — C7800 Secondary malignant neoplasm of unspecified lung: Secondary | ICD-10-CM | POA: Diagnosis not present

## 2020-07-28 DIAGNOSIS — Z5111 Encounter for antineoplastic chemotherapy: Secondary | ICD-10-CM | POA: Diagnosis present

## 2020-07-28 LAB — TOTAL PROTEIN, URINE DIPSTICK: Protein, ur: <30 mg/dL — AB

## 2020-07-28 MED ORDER — SODIUM CHLORIDE 0.9 % IV SOLN
15.0000 mg/kg | Freq: Once | INTRAVENOUS | Status: AC
  Administered 2020-07-28: 1500 mg via INTRAVENOUS
  Filled 2020-07-28: qty 48

## 2020-07-28 MED ORDER — PALONOSETRON HCL INJECTION 0.25 MG/5ML
INTRAVENOUS | Status: AC
  Filled 2020-07-28: qty 5

## 2020-07-28 MED ORDER — PALONOSETRON HCL INJECTION 0.25 MG/5ML
0.2500 mg | Freq: Once | INTRAVENOUS | Status: AC
  Administered 2020-07-28: 0.25 mg via INTRAVENOUS

## 2020-07-28 MED ORDER — FAMOTIDINE IN NACL 20-0.9 MG/50ML-% IV SOLN
INTRAVENOUS | Status: AC
  Filled 2020-07-28: qty 50

## 2020-07-28 MED ORDER — SODIUM CHLORIDE 0.9 % IV SOLN
462.5000 mg | Freq: Once | INTRAVENOUS | Status: AC
  Administered 2020-07-28: 460 mg via INTRAVENOUS
  Filled 2020-07-28: qty 46

## 2020-07-28 MED ORDER — HEPARIN SOD (PORK) LOCK FLUSH 100 UNIT/ML IV SOLN
500.0000 [IU] | Freq: Once | INTRAVENOUS | Status: AC | PRN
Start: 2020-07-28 — End: 2020-07-28
  Administered 2020-07-28: 500 [IU]
  Filled 2020-07-28: qty 5

## 2020-07-28 MED ORDER — ZOLEDRONIC ACID 4 MG/5ML IV CONC: 3.0000 mg | Freq: Once | INTRAVENOUS | Status: DC

## 2020-07-28 MED ORDER — FAMOTIDINE IN NACL 20-0.9 MG/50ML-% IV SOLN
20.0000 mg | Freq: Once | INTRAVENOUS | Status: AC
  Administered 2020-07-28: 20 mg via INTRAVENOUS

## 2020-07-28 MED ORDER — DEXAMETHASONE SODIUM PHOSPHATE 100 MG/10ML IJ SOLN
10.0000 mg | Freq: Once | INTRAMUSCULAR | Status: AC
  Administered 2020-07-28: 10 mg via INTRAVENOUS
  Filled 2020-07-28: qty 10

## 2020-07-28 MED ORDER — DIPHENHYDRAMINE HCL 50 MG/ML IJ SOLN
INTRAMUSCULAR | Status: AC
  Filled 2020-07-28: qty 1

## 2020-07-28 MED ORDER — DIPHENHYDRAMINE HCL 50 MG/ML IJ SOLN
25.0000 mg | Freq: Once | INTRAMUSCULAR | Status: AC
  Administered 2020-07-28: 25 mg via INTRAVENOUS

## 2020-07-28 MED ORDER — SODIUM CHLORIDE 0.9 % IV SOLN
Freq: Once | INTRAVENOUS | Status: AC
  Filled 2020-07-28: qty 250

## 2020-07-28 MED ORDER — ZOLEDRONIC ACID 4 MG/100ML IV SOLN: 4.0000 mg | Freq: Once | INTRAVENOUS | Status: DC

## 2020-07-28 MED ORDER — SODIUM CHLORIDE 0.9 % IV SOLN
140.0000 mg/m2 | Freq: Once | INTRAVENOUS | Status: AC
  Administered 2020-07-28: 294 mg via INTRAVENOUS
  Filled 2020-07-28: qty 49

## 2020-07-28 MED ORDER — SODIUM CHLORIDE 0.9% FLUSH
10.0000 mL | INTRAVENOUS | Status: DC | PRN
  Administered 2020-07-28: 10 mL
  Filled 2020-07-28: qty 10

## 2020-07-28 MED ORDER — SODIUM CHLORIDE 0.9 % IV SOLN
150.0000 mg | Freq: Once | INTRAVENOUS | Status: AC
  Administered 2020-07-28: 150 mg via INTRAVENOUS
  Filled 2020-07-28: qty 150

## 2020-07-28 MED ORDER — ZOLEDRONIC ACID 4 MG/5ML IV CONC
3.0000 mg | Freq: Once | INTRAVENOUS | Status: AC
  Administered 2020-07-28: 3 mg via INTRAVENOUS
  Filled 2020-07-28: qty 3.75

## 2020-07-28 NOTE — Patient Instructions (Addendum)
[facility] Discharge Instructions for Patients Receiving Chemotherapy  Today you received the following chemotherapy agent: Bevacizumab, Paclitaxel (Taxol), Carboplatin, Zoledronic Acid (Zometa)   To help prevent nausea and vomiting after your treatment, we encourage you to take your nausea medication    If you develop nausea and vomiting that is not controlled by your nausea medication, call the clinic.   BELOW ARE SYMPTOMS THAT SHOULD BE REPORTED IMMEDIATELY:  *FEVER GREATER THAN 100.5 F  *CHILLS WITH OR WITHOUT FEVER  NAUSEA AND VOMITING THAT IS NOT CONTROLLED WITH YOUR NAUSEA MEDICATION  *UNUSUAL SHORTNESS OF BREATH  *UNUSUAL BRUISING OR BLEEDING  TENDERNESS IN MOUTH AND THROAT WITH OR WITHOUT PRESENCE OF ULCERS  *URINARY PROBLEMS  *BOWEL PROBLEMS  UNUSUAL RASH Items with * indicate a potential emergency and should be followed up as soon as possible.  Feel free to call the clinic should you have any questions or concerns. The clinic phone number is [phone].  Please show the [place] at check-in to the Emergency Department and triage nurse.  Bevacizumab injection What is this medicine? BEVACIZUMAB (be va SIZ yoo mab) is a monoclonal antibody. It is used to treat many types of cancer. This medicine may be used for other purposes; ask your health care provider or pharmacist if you have questions. COMMON BRAND NAME(S): Avastin, MVASI, Zirabev What should I tell my health care provider before I take this medicine? They need to know if you have any of these conditions:  diabetes  heart disease  high blood pressure  history of coughing up blood  prior anthracycline chemotherapy (e.g., doxorubicin, daunorubicin, epirubicin)  recent or ongoing radiation therapy  recent or planning to have surgery  stroke  an unusual or allergic reaction to bevacizumab, hamster proteins, mouse proteins, other medicines, foods, dyes, or  preservatives  pregnant or trying to get pregnant  breast-feeding How should I use this medicine? This medicine is for infusion into a vein. It is given by a health care professional in a hospital or clinic setting. Talk to your pediatrician regarding the use of this medicine in children. Special care may be needed. Overdosage: If you think you have taken too much of this medicine contact a poison control center or emergency room at once. NOTE: This medicine is only for you. Do not share this medicine with others. What if I miss a dose? It is important not to miss your dose. Call your doctor or health care professional if you are unable to keep an appointment. What may interact with this medicine? Interactions are not expected. This list may not describe all possible interactions. Give your health care provider a list of all the medicines, herbs, non-prescription drugs, or dietary supplements you use. Also tell them if you smoke, drink alcohol, or use illegal drugs. Some items may interact with your medicine. What should I watch for while using this medicine? Your condition will be monitored carefully while you are receiving this medicine. You will need important blood work and urine testing done while you are taking this medicine. This medicine may increase your risk to bruise or bleed. Call your doctor or health care professional if you notice any unusual bleeding. Before having surgery, talk to your health care provider to make sure it is ok. This drug can increase the risk of poor healing of your surgical site or wound. You will need to stop this drug for 28 days before surgery. After surgery, wait at least 28 days before restarting  this drug. Make sure the surgical site or wound is healed enough before restarting this drug. Talk to your health care provider if questions. Do not become pregnant while taking this medicine or for 6 months after stopping it. Women should inform their doctor if  they wish to become pregnant or think they might be pregnant. There is a potential for serious side effects to an unborn child. Talk to your health care professional or pharmacist for more information. Do not breast-feed an infant while taking this medicine and for 6 months after the last dose. This medicine has caused ovarian failure in some women. This medicine may interfere with the ability to have a child. You should talk to your doctor or health care professional if you are concerned about your fertility. What side effects may I notice from receiving this medicine? Side effects that you should report to your doctor or health care professional as soon as possible:  allergic reactions like skin rash, itching or hives, swelling of the face, lips, or tongue  chest pain or chest tightness  chills  coughing up blood  high fever  seizures  severe constipation  signs and symptoms of bleeding such as bloody or black, tarry stools; red or dark-brown urine; spitting up blood or brown material that looks like coffee grounds; red spots on the skin; unusual bruising or bleeding from the eye, gums, or nose  signs and symptoms of a blood clot such as breathing problems; chest pain; severe, sudden headache; pain, swelling, warmth in the leg  signs and symptoms of a stroke like changes in vision; confusion; trouble speaking or understanding; severe headaches; sudden numbness or weakness of the face, arm or leg; trouble walking; dizziness; loss of balance or coordination  stomach pain  sweating  swelling of legs or ankles  vomiting  weight gain Side effects that usually do not require medical attention (report to your doctor or health care professional if they continue or are bothersome):  back pain  changes in taste  decreased appetite  dry skin  nausea  tiredness This list may not describe all possible side effects. Call your doctor for medical advice about side effects. You may  report side effects to FDA at 1-800-FDA-1088. Where should I keep my medicine? This drug is given in a hospital or clinic and will not be stored at home. NOTE: This sheet is a summary. It may not cover all possible information. If you have questions about this medicine, talk to your doctor, pharmacist, or health care provider.  2021 Elsevier/Gold Standard (2019-04-02 10:50:46)  Paclitaxel injection What is this medicine? PACLITAXEL (PAK li TAX el) is a chemotherapy drug. It targets fast dividing cells, like cancer cells, and causes these cells to die. This medicine is used to treat ovarian cancer, breast cancer, lung cancer, Kaposi's sarcoma, and other cancers. This medicine may be used for other purposes; ask your health care provider or pharmacist if you have questions. COMMON BRAND NAME(S): Onxol, Taxol What should I tell my health care provider before I take this medicine? They need to know if you have any of these conditions:  history of irregular heartbeat  liver disease  low blood counts, like low white cell, platelet, or red cell counts  lung or breathing disease, like asthma  tingling of the fingers or toes, or other nerve disorder  an unusual or allergic reaction to paclitaxel, alcohol, polyoxyethylated castor oil, other chemotherapy, other medicines, foods, dyes, or preservatives  pregnant or trying to get  pregnant  breast-feeding How should I use this medicine? This drug is given as an infusion into a vein. It is administered in a hospital or clinic by a specially trained health care professional. Talk to your pediatrician regarding the use of this medicine in children. Special care may be needed. Overdosage: If you think you have taken too much of this medicine contact a poison control center or emergency room at once. NOTE: This medicine is only for you. Do not share this medicine with others. What if I miss a dose? It is important not to miss your dose. Call your  doctor or health care professional if you are unable to keep an appointment. What may interact with this medicine? Do not take this medicine with any of the following medications:  live virus vaccines This medicine may also interact with the following medications:  antiviral medicines for hepatitis, HIV or AIDS  certain antibiotics like erythromycin and clarithromycin  certain medicines for fungal infections like ketoconazole and itraconazole  certain medicines for seizures like carbamazepine, phenobarbital, phenytoin  gemfibrozil  nefazodone  rifampin  St. John's wort This list may not describe all possible interactions. Give your health care provider a list of all the medicines, herbs, non-prescription drugs, or dietary supplements you use. Also tell them if you smoke, drink alcohol, or use illegal drugs. Some items may interact with your medicine. What should I watch for while using this medicine? Your condition will be monitored carefully while you are receiving this medicine. You will need important blood work done while you are taking this medicine. This medicine can cause serious allergic reactions. To reduce your risk you will need to take other medicine(s) before treatment with this medicine. If you experience allergic reactions like skin rash, itching or hives, swelling of the face, lips, or tongue, tell your doctor or health care professional right away. In some cases, you may be given additional medicines to help with side effects. Follow all directions for their use. This drug may make you feel generally unwell. This is not uncommon, as chemotherapy can affect healthy cells as well as cancer cells. Report any side effects. Continue your course of treatment even though you feel ill unless your doctor tells you to stop. Call your doctor or health care professional for advice if you get a fever, chills or sore throat, or other symptoms of a cold or flu. Do not treat yourself.  This drug decreases your body's ability to fight infections. Try to avoid being around people who are sick. This medicine may increase your risk to bruise or bleed. Call your doctor or health care professional if you notice any unusual bleeding. Be careful brushing and flossing your teeth or using a toothpick because you may get an infection or bleed more easily. If you have any dental work done, tell your dentist you are receiving this medicine. Avoid taking products that contain aspirin, acetaminophen, ibuprofen, naproxen, or ketoprofen unless instructed by your doctor. These medicines may hide a fever. Do not become pregnant while taking this medicine. Women should inform their doctor if they wish to become pregnant or think they might be pregnant. There is a potential for serious side effects to an unborn child. Talk to your health care professional or pharmacist for more information. Do not breast-feed an infant while taking this medicine. Men are advised not to father a child while receiving this medicine. This product may contain alcohol. Ask your pharmacist or healthcare provider if this medicine contains alcohol.  Be sure to tell all healthcare providers you are taking this medicine. Certain medicines, like metronidazole and disulfiram, can cause an unpleasant reaction when taken with alcohol. The reaction includes flushing, headache, nausea, vomiting, sweating, and increased thirst. The reaction can last from 30 minutes to several hours. What side effects may I notice from receiving this medicine? Side effects that you should report to your doctor or health care professional as soon as possible:  allergic reactions like skin rash, itching or hives, swelling of the face, lips, or tongue  breathing problems  changes in vision  fast, irregular heartbeat  high or low blood pressure  mouth sores  pain, tingling, numbness in the hands or feet  signs of decreased platelets or bleeding -  bruising, pinpoint red spots on the skin, black, tarry stools, blood in the urine  signs of decreased red blood cells - unusually weak or tired, feeling faint or lightheaded, falls  signs of infection - fever or chills, cough, sore throat, pain or difficulty passing urine  signs and symptoms of liver injury like dark yellow or brown urine; general ill feeling or flu-like symptoms; light-colored stools; loss of appetite; nausea; right upper belly pain; unusually weak or tired; yellowing of the eyes or skin  swelling of the ankles, feet, hands  unusually slow heartbeat Side effects that usually do not require medical attention (report to your doctor or health care professional if they continue or are bothersome):  diarrhea  hair loss  loss of appetite  muscle or joint pain  nausea, vomiting  pain, redness, or irritation at site where injected  tiredness This list may not describe all possible side effects. Call your doctor for medical advice about side effects. You may report side effects to FDA at 1-800-FDA-1088. Where should I keep my medicine? This drug is given in a hospital or clinic and will not be stored at home. NOTE: This sheet is a summary. It may not cover all possible information. If you have questions about this medicine, talk to your doctor, pharmacist, or health care provider.  2021 Elsevier/Gold Standard (2019-05-07 13:37:23)  Carboplatin injection What is this medicine? CARBOPLATIN (KAR boe pla tin) is a chemotherapy drug. It targets fast dividing cells, like cancer cells, and causes these cells to die. This medicine is used to treat ovarian cancer and many other cancers. This medicine may be used for other purposes; ask your health care provider or pharmacist if you have questions. COMMON BRAND NAME(S): Paraplatin What should I tell my health care provider before I take this medicine? They need to know if you have any of these conditions:  blood  disorders  hearing problems  kidney disease  recent or ongoing radiation therapy  an unusual or allergic reaction to carboplatin, cisplatin, other chemotherapy, other medicines, foods, dyes, or preservatives  pregnant or trying to get pregnant  breast-feeding How should I use this medicine? This drug is usually given as an infusion into a vein. It is administered in a hospital or clinic by a specially trained health care professional. Talk to your pediatrician regarding the use of this medicine in children. Special care may be needed. Overdosage: If you think you have taken too much of this medicine contact a poison control center or emergency room at once. NOTE: This medicine is only for you. Do not share this medicine with others. What if I miss a dose? It is important not to miss a dose. Call your doctor or health care professional if you  are unable to keep an appointment. What may interact with this medicine?  medicines for seizures  medicines to increase blood counts like filgrastim, pegfilgrastim, sargramostim  some antibiotics like amikacin, gentamicin, neomycin, streptomycin, tobramycin  vaccines Talk to your doctor or health care professional before taking any of these medicines:  acetaminophen  aspirin  ibuprofen  ketoprofen  naproxen This list may not describe all possible interactions. Give your health care provider a list of all the medicines, herbs, non-prescription drugs, or dietary supplements you use. Also tell them if you smoke, drink alcohol, or use illegal drugs. Some items may interact with your medicine. What should I watch for while using this medicine? Your condition will be monitored carefully while you are receiving this medicine. You will need important blood work done while you are taking this medicine. This drug may make you feel generally unwell. This is not uncommon, as chemotherapy can affect healthy cells as well as cancer cells. Report any  side effects. Continue your course of treatment even though you feel ill unless your doctor tells you to stop. In some cases, you may be given additional medicines to help with side effects. Follow all directions for their use. Call your doctor or health care professional for advice if you get a fever, chills or sore throat, or other symptoms of a cold or flu. Do not treat yourself. This drug decreases your body's ability to fight infections. Try to avoid being around people who are sick. This medicine may increase your risk to bruise or bleed. Call your doctor or health care professional if you notice any unusual bleeding. Be careful brushing and flossing your teeth or using a toothpick because you may get an infection or bleed more easily. If you have any dental work done, tell your dentist you are receiving this medicine. Avoid taking products that contain aspirin, acetaminophen, ibuprofen, naproxen, or ketoprofen unless instructed by your doctor. These medicines may hide a fever. Do not become pregnant while taking this medicine. Women should inform their doctor if they wish to become pregnant or think they might be pregnant. There is a potential for serious side effects to an unborn child. Talk to your health care professional or pharmacist for more information. Do not breast-feed an infant while taking this medicine. What side effects may I notice from receiving this medicine? Side effects that you should report to your doctor or health care professional as soon as possible:  allergic reactions like skin rash, itching or hives, swelling of the face, lips, or tongue  signs of infection - fever or chills, cough, sore throat, pain or difficulty passing urine  signs of decreased platelets or bleeding - bruising, pinpoint red spots on the skin, black, tarry stools, nosebleeds  signs of decreased red blood cells - unusually weak or tired, fainting spells, lightheadedness  breathing  problems  changes in hearing  changes in vision  chest pain  high blood pressure  low blood counts - This drug may decrease the number of white blood cells, red blood cells and platelets. You may be at increased risk for infections and bleeding.  nausea and vomiting  pain, swelling, redness or irritation at the injection site  pain, tingling, numbness in the hands or feet  problems with balance, talking, walking  trouble passing urine or change in the amount of urine Side effects that usually do not require medical attention (report to your doctor or health care professional if they continue or are bothersome):  hair loss  loss of appetite  metallic taste in the mouth or changes in taste This list may not describe all possible side effects. Call your doctor for medical advice about side effects. You may report side effects to FDA at 1-800-FDA-1088. Where should I keep my medicine? This drug is given in a hospital or clinic and will not be stored at home. NOTE: This sheet is a summary. It may not cover all possible information. If you have questions about this medicine, talk to your doctor, pharmacist, or health care provider.  2021 Elsevier/Gold Standard (2007-09-10 14:38:05)  Zoledronic Acid Injection  What is this medicine? ZOLEDRONIC ACID (ZOE le dron ik AS id) slows calcium loss from bones. It high calcium levels in the blood from some kinds of cancer. It may be used in other people at risk for bone loss. This medicine may be used for other purposes; ask your health care provider or pharmacist if you have questions. COMMON BRAND NAME(S): Zometa What should I tell my health care provider before I take this medicine? They need to know if you have any of these conditions:  cancer  dehydration  dental disease  kidney disease  liver disease  low levels of calcium in the blood  lung or breathing disease (asthma)  receiving steroids like dexamethasone or  prednisone  an unusual or allergic reaction to zoledronic acid, other medicines, foods, dyes, or preservatives  pregnant or trying to get pregnant  breast-feeding How should I use this medicine? This drug is injected into a vein. It is given by a health care provider in a hospital or clinic setting. Talk to your health care provider about the use of this drug in children. Special care may be needed. Overdosage: If you think you have taken too much of this medicine contact a poison control center or emergency room at once. NOTE: This medicine is only for you. Do not share this medicine with others. What if I miss a dose? Keep appointments for follow-up doses. It is important not to miss your dose. Call your health care provider if you are unable to keep an appointment. What may interact with this medicine?  certain antibiotics given by injection  NSAIDs, medicines for pain and inflammation, like ibuprofen or naproxen  some diuretics like bumetanide, furosemide  teriparatide  thalidomide This list may not describe all possible interactions. Give your health care provider a list of all the medicines, herbs, non-prescription drugs, or dietary supplements you use. Also tell them if you smoke, drink alcohol, or use illegal drugs. Some items may interact with your medicine. What should I watch for while using this medicine? Visit your health care provider for regular checks on your progress. It may be some time before you see the benefit from this drug. Some people who take this drug have severe bone, joint, or muscle pain. This drug may also increase your risk for jaw problems or a broken thigh bone. Tell your health care provider right away if you have severe pain in your jaw, bones, joints, or muscles. Tell you health care provider if you have any pain that does not go away or that gets worse. Tell your dentist and dental surgeon that you are taking this drug. You should not have major  dental surgery while on this drug. See your dentist to have a dental exam and fix any dental problems before starting this drug. Take good care of your teeth while on this drug. Make sure you see your dentist for regular  follow-up appointments. You should make sure you get enough calcium and vitamin D while you are taking this drug. Discuss the foods you eat and the vitamins you take with your health care provider. Check with your health care provider if you have severe diarrhea, nausea, and vomiting, or if you sweat a lot. The loss of too much body fluid may make it dangerous for you to take this drug. You may need blood work done while you are taking this drug. Do not become pregnant while taking this drug. Women should inform their health care provider if they wish to become pregnant or think they might be pregnant. There is potential for serious harm to an unborn child. Talk to your health care provider for more information. What side effects may I notice from receiving this medicine? Side effects that you should report to your doctor or health care provider as soon as possible:  allergic reactions (skin rash, itching or hives; swelling of the face, lips, or tongue)  bone pain  infection (fever, chills, cough, sore throat, pain or trouble passing urine)  jaw pain, especially after dental work  joint pain  kidney injury (trouble passing urine or change in the amount of urine)  low blood pressure (dizziness; feeling faint or lightheaded, falls; unusually weak or tired)  low calcium levels (fast heartbeat; muscle cramps or pain; pain, tingling, or numbness in the hands or feet; seizures)  low magnesium levels (fast, irregular heartbeat; muscle cramp or pain; muscle weakness; tremors; seizures)  low red blood cell counts (trouble breathing; feeling faint; lightheaded, falls; unusually weak or tired)  muscle pain  redness, blistering, peeling, or loosening of the skin, including inside  the mouth  severe diarrhea  swelling of the ankles, feet, hands  trouble breathing Side effects that usually do not require medical attention (report to your doctor or health care provider if they continue or are bothersome):  anxious  constipation  coughing  depressed mood  eye irritation, itching, or pain  fever  general ill feeling or flu-like symptoms  nausea  pain, redness, or irritation at site where injected  trouble sleeping This list may not describe all possible side effects. Call your doctor for medical advice about side effects. You may report side effects to FDA at 1-800-FDA-1088. Where should I keep my medicine? This drug is given in a hospital or clinic. It will not be stored at home. NOTE: This sheet is a summary. It may not cover all possible information. If you have questions about this medicine, talk to your doctor, pharmacist, or health care provider.  2021 Elsevier/Gold Standard (2019-03-20 09:13:00)

## 2020-07-28 NOTE — Progress Notes (Signed)
Dr Alvy Bimler sent note to inform that she wants dose of zometa to be given at 3 mg.  Franciso Bend, PharmD

## 2020-07-28 NOTE — Telephone Encounter (Signed)
Requested test number [phone] with [facility] for the PD-L1 testing.

## 2020-07-29 ENCOUNTER — Telehealth: Payer: Self-pay | Admitting: *Deleted

## 2020-08-03 ENCOUNTER — Telehealth: Payer: Self-pay | Admitting: Radiation Oncology

## 2020-08-03 ENCOUNTER — Telehealth: Payer: Self-pay | Admitting: Oncology

## 2020-08-03 NOTE — Telephone Encounter (Signed)
Pt had left a vcml to get her 1 mo f/u w/Dr. Sondra Come scheduled. I have her scheduled for 3/3 at 11:30a. I left her a vcml to call if she needs to change it.

## 2020-08-03 NOTE — Telephone Encounter (Signed)
Called [facility] at [phone] to check PD-L1 status.  They said it should be resulted tomorrow afternoon and they will fax the results to [place].  The specimen ID is [id number] if we need to call again to check on it.

## 2020-08-04 ENCOUNTER — Other Ambulatory Visit: Payer: Self-pay

## 2020-08-04 ENCOUNTER — Encounter: Payer: Self-pay | Admitting: Hematology and Oncology

## 2020-08-04 ENCOUNTER — Inpatient Hospital Stay (HOSPITAL_BASED_OUTPATIENT_CLINIC_OR_DEPARTMENT_OTHER): Payer: Managed Care, Other (non HMO) | Admitting: Hematology and Oncology

## 2020-08-04 DIAGNOSIS — C7951 Secondary malignant neoplasm of bone: Secondary | ICD-10-CM

## 2020-08-04 DIAGNOSIS — R110 Nausea: Secondary | ICD-10-CM

## 2020-08-04 DIAGNOSIS — C539 Malignant neoplasm of cervix uteri, unspecified: Secondary | ICD-10-CM | POA: Diagnosis not present

## 2020-08-04 NOTE — Assessment & Plan Note (Signed)
She has mild intermittent bone pain She will continue tramadol as needed

## 2020-08-04 NOTE — Assessment & Plan Note (Signed)
She had recent mild nausea She will continue antiemetics as needed

## 2020-08-04 NOTE — Assessment & Plan Note (Signed)
She tolerated recent chemotherapy well except for mild achiness, fatigue and one episode of nausea Her blood pressure readings are within normal range I will see her back in 2 weeks for further follow-up PD-L1 testing is still pending

## 2020-08-04 NOTE — Progress Notes (Signed)
HEMATOLOGY-ONCOLOGY ELECTRONIC VISIT PROGRESS NOTE  Patient Care Team: [name], MD as PCP - General (Family Medicine) Noland Fordyce, MD as Consulting Physician (Obstetrics and Gynecology) Paulina Fusi Servando Snare, RN as Oncology Nurse Navigator (Oncology)  I connected with by telephone  ASSESSMENT & PLAN:  Cervical cancer, FIGO stage IVB (HCC) She tolerated recent chemotherapy well except for mild achiness, fatigue and one episode of nausea Her blood pressure readings are within normal range I will see her back in 2 weeks for further follow-up PD-L1 testing is still pending  Metastasis to bone [facility]) She has mild intermittent bone pain She will continue tramadol as needed  Nausea without vomiting She had recent mild nausea She will continue antiemetics as needed   No orders of the defined types were placed in this encounter.   INTERVAL HISTORY: Please see below for problem oriented charting. The purpose of today's visit is to review recent chemotherapy side effects Since her treatment a week ago, she had one episode of nausea No vomiting She had some achiness in her bones of which she took tramadol She has some mild fatigue Her documented blood pressure over the last week has been fairly well In the first few days on February 8, February 9 in February 10, her systolic blood pressure has been in the 140s Since then, her systolic blood pressures less than 140 with diastolic blood pressure in the 80s She denies peripheral neuropathy  SUMMARY OF ONCOLOGIC HISTORY: Oncology History  Cervical cancer, FIGO stage IVB (HCC)  03/16/2020 Initial Diagnosis   The patient had history of new onset vaginal spotting on March 16, 2020.  She immediately called her OB/GYN's office and achieved an appointment with her OB/GYN's partner, Dr. Amado Nash, who saw and evaluated the patient on March 19, 2020.  At that time a transvaginal ultrasound scan was performed which revealed a uterus  measuring 5.3 x 8.4 x 4.6 cm with an ill-defined endometrium.  There is a complex cyst on the left ovary measuring 2.6 cm with peripheral blood flow noted.  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  Immunostains were positive for CEA and CDX2, negative for p16, ER, vimentin, and PAX8.  The differential diagnosis included primary gastric type endocervical adenocarcinoma (though this typically stains positive for PAX8) or a metastasis from pancreaticobiliary or upper GI primary.   03/29/2020 Pathology Results   1. Surgical [P], duodenum - PEPTIC DUODENITIS. - NO DYSPLASIA OR MALIGNANCY. 2. Surgical [P], gastric antrum and gastric body - REACTIVE GASTROPATHY. [place] IS NEGATIVE FOR HELICOBACTER PYLORI. - NO INTESTINAL METAPLASIA, DYSPLASIA, OR MALIGNANCY. 3. Surgical [P], gastric nodule - MILD REACTIVE GASTROPATHY. - NO INTESTINAL METAPLASIA, DYSPLASIA, OR MALIGNANCY. 4. Surgical [P], stomach, lesser curve ulcers - REACTIVE GASTROPATHY WITH EROSIONS. - NO INTESTINAL METAPLASIA, DYSPLASIA, OR MALIGNANCY. 5. Surgical [P], gastric polyps - HYPERPLASTIC POLYP. - NO INTESTINAL METAPLASIA, DYSPLASIA OR MALIGNANCY. 6. Surgical [P], colon, descending, polyp - HYPERPLASTIC POLYP. - NO DYSPLASIA OR MALIGNANCY.   04/06/2020 Procedure   EGD report  Normal esophagus. - A few gastric polyps. Suspected fundic gland polyps. - Non-bleeding gastric ulcers with pigmented material. Biopsied. - A single gastric polyp/nodule. Resected and retrieved. - Erythematous duodenopathy. Biopsied. - The examination was otherwise normal.   04/06/2020 Procedure   Colonoscopy report - Non-bleeding internal hemorrhoids. - One 4 mm polyp in the descending colon, removed with a cold snare. Resected and retrieved. - The examination  was otherwise normal on direct and retroflexion views    04/19/2020 PET scan   1. Hypermetabolic uterine cervix mass compatible with known primary cervical malignancy, with hypermetabolism extending into the upper third of the vagina and extending throughout the uterine body. No frank extrauterine extension. 2. Mild to moderate left hydroureteronephrosis to the level of the left UVJ, concerning for tumor involvement of the left UVJ. 3. Hypermetabolic right common iliac nodal metastasis. 4. Hypermetabolic sclerotic right ischial bone metastasis. 5. Two indeterminate small scattered solid pulmonary nodules, below PET resolution, warranting attention on chest CT follow-up in 3 months. 6.  Aortic Atherosclerosis (ICD10-I70.0).   04/19/2020 Cancer Staging   Staging form: Cervix Uteri, AJCC Version 9 - Clinical stage from 04/19/2020: FIGO Stage IVB (cT3b, cN1a, cM1) - Signed by [name], MD on 04/19/2020   04/26/2020 Procedure   Successful placement of a right IJ approach Power Port with ultrasound and fluoroscopic guidance. The catheter is ready for use   04/30/2020 -  Chemotherapy   The patient had cisplatin for chemotherapy treatment.     06/20/2020 Genetic Testing   Negative genetic testing on the CancerNext-Expanded+RNAinsight panel.  The CancerNext-Expanded gene panel offered by [facility] and includes sequencing and rearrangement analysis for the following 77 genes: AIP, ALK, APC*, ATM*, AXIN2, BAP1, BARD1, BLM, BMPR1A, BRCA1*, BRCA2*, BRIP1*, CDC73, CDH1*, CDK4, CDKN1B, CDKN2A, CHEK2*, CTNNA1, DICER1, FANCC, FH, FLCN, GALNT12, KIF1B, LZTR1, MAX, MEN1, MET, MLH1*, MSH2*, MSH3, MSH6*, MUTYH*, NBN, NF1*, NF2, NTHL1, PALB2*, PHOX2B, PMS2*, POT1, PRKAR1A, PTCH1, PTEN*, RAD51C*, RAD51D*, RB1, RECQL, RET, SDHA, SDHAF2, SDHB, SDHC, SDHD, SMAD4, SMARCA4, SMARCB1, SMARCE1, STK11, SUFU, TMEM127, TP53*, TSC1, TSC2, VHL and XRCC2 (sequencing and deletion/duplication); EGFR, EGLN1, HOXB13, KIT, MITF, PDGFRA, POLD1, and POLE (sequencing only); EPCAM and GREM1  (deletion/duplication only). DNA and RNA analyses performed for * genes. The report date is June 20, 2020.   07/28/2020 -  Chemotherapy    Patient is on Treatment Plan: CERVICAL PEMBROLIZUMAB/CARBOPLATIN + PACLITAXEL + BEVACIZUMAB Q21D       Metastasis to bone ([facility])  04/19/2020 Initial Diagnosis   Metastasis to bone ([place])   07/28/2020 -  Chemotherapy    Patient is on Treatment Plan: CERVICAL PEMBROLIZUMAB/CARBOPLATIN + PACLITAXEL + BEVACIZUMAB Q21D         REVIEW OF SYSTEMS:   Constitutional: Denies fevers, chills or abnormal weight loss Eyes: Denies blurriness of vision Ears, nose, mouth, throat, and face: Denies mucositis or sore throat Respiratory: Denies cough, dyspnea or wheezes Cardiovascular: Denies palpitation, chest discomfort Skin: Denies abnormal skin rashes Lymphatics: Denies new lymphadenopathy or easy bruising Neurological:Denies numbness, tingling or new weaknesses Behavioral/Psych: Mood is stable, no new changes  Extremities: No lower extremity edema All other systems were reviewed with the patient and are negative.  I have reviewed the past medical history, past surgical history, social history and family history with the patient and they are unchanged from previous note.  ALLERGIES:  is allergic to penicillins.  MEDICATIONS:  Current Outpatient Medications  Medication Sig Dispense Refill  . calcium carbonate (TUMS - DOSED IN MG ELEMENTAL CALCIUM) 500 MG chewable tablet Chew 1 tablet by mouth 2 (two) times daily.    . Cholecalciferol (VITAMIN D3) 2000 units TABS Take 2 tablets by mouth daily.    . clobetasol cream (TEMOVATE) 0.05 % Apply topically as needed. Left leg    . dexamethasone (DECADRON) 4 MG tablet Take 4 mg by mouth as directed.    . fenofibrate 160 MG tablet TAKE 1 TABLET BY MOUTH  EVERY DAY (Patient taking differently: Take 160 mg by mouth daily with lunch.) 30 tablet 5  . lidocaine-prilocaine (EMLA) cream Apply to affected area once 30 g 3  .  LORazepam (ATIVAN) 0.5 MG tablet Take 1 tablet (0.5 mg total) by mouth every 8 (eight) hours as needed for anxiety. 30 tablet 0  . ondansetron (ZOFRAN) 8 MG tablet Take 1 tablet (8 mg total) by mouth every 8 (eight) hours as needed. Start on the third day after cisplatin chemotherapy. 30 tablet 1  . pantoprazole (PROTONIX) 40 MG tablet Take 1 tablet (40 mg total) by mouth 2 (two) times daily. 90 tablet 3  . prochlorperazine (COMPAZINE) 10 MG tablet Take 1 tablet (10 mg total) by mouth every 6 (six) hours as needed (Nausea or vomiting). 30 tablet 1  . traMADol (ULTRAM) 50 MG tablet Take 2 tablets (100 mg total) by mouth every 6 (six) hours as needed. 60 tablet 0   No current facility-administered medications for this visit.    PHYSICAL EXAMINATION: ECOG PERFORMANCE STATUS: 0 - Asymptomatic  LABORATORY DATA:  I have reviewed the data as listed CMP Latest Ref Rng & Units 07/27/2020 07/13/2020 06/24/2020  Glucose 70 - 99 mg/dL 98 105(H) 106(H)  BUN 8 - 23 mg/dL 20 17 24(H)  Creatinine 0.44 - 1.00 mg/dL 1.33(H) 1.23(H) 0.99  Sodium 135 - 145 mmol/L 142 139 140  Potassium 3.5 - 5.1 mmol/L 3.5 3.9 3.4(L)  Chloride 98 - 111 mmol/L 109 106 105  CO2 22 - 32 mmol/L _0 Calcium 8.9 - 10.3 mg/dL 9.4 9.4 9.8  Total Protein 6.5 - 8.1 g/dL 6.8 6.8 6.8  Total Bilirubin 0.3 - 1.2 mg/dL 0.4 0.4 0.4  Alkaline Phos 38 - 126 U/L 52 60 61  AST 15 - 41 U/L 12(L) 11(L) 8(L)  ALT 0 - 44 U/L _1 Lab Results  Component Value Date   WBC 5.2 07/27/2020   HGB 10.2 (L) 07/27/2020   HCT 31.6 (L) 07/27/2020   MCV 94.9 07/27/2020   PLT 171 07/27/2020   NEUTROABS 4.0 07/27/2020     RADIOGRAPHIC STUDIES: I have personally reviewed the radiological images as listed and agreed with the findings in the report. [place] Intraoperative  Result Date: 07/19/2020 CLINICAL DATA:  Ultrasound was provided for use by the ordering physician.  No provider Interpretation or professional fees incurred.    [place]  Intraoperative  Result Date: 07/14/2020 CLINICAL DATA:  Ultrasound was provided for use by the ordering physician.  No provider Interpretation or professional fees incurred.    [place] Intraoperative  Result Date: 07/06/2020 CLINICAL DATA:  Ultrasound was provided for use by the ordering physician.  No provider Interpretation or professional fees incurred.    NM PET Image Restage (PS) Skull Base to Thigh  Result Date: 07/27/2020 CLINICAL DATA:  Subsequent treatment strategy for cervical cancer. EXAM: NUCLEAR MEDICINE PET SKULL BASE TO THIGH TECHNIQUE: 10.6 mCi F-18 FDG was injected intravenously. Full-ring PET imaging was performed from the skull base to thigh after the radiotracer. CT data was obtained and used for attenuation correction and anatomic localization. Fasting blood glucose: 104 mg/dl COMPARISON:  04/19/2020 FINDINGS: Mediastinal blood pool activity: SUV max 3.0 Liver activity: SUV max NA NECK: No hypermetabolic lymph nodes in the neck. Incidental CT findings: none CHEST: New and enlarging bilateral pulmonary nodules are identified. 10 mm nodule in the suprahilar right upper lobe (image 63/series 4) is new since prior and demonstrates SUV max =  3.3. 11 mm subpleural nodule in the right apex (57/4) has increased from 6 mm previously. This nodule also shows low level FDG uptake with SUV max = 2.0. Remaining bilateral pulmonary nodules are tiny, below size threshold for reliable resolution on PET imaging. 6-7 mm medial right upper lobe nodule on 63/4 is new in the interval. 3 mm right lower lobe nodule on 79/4 is new since prior. 2-3 mm left upper lobe nodule on 69/4 is new since prior. Incidental CT findings: Right Port-A-Cath tip is positioned in the upper right atrium. ABDOMEN/PELVIS: No abnormal hypermetabolic activity within the liver, pancreas, adrenal glands, or spleen. Punctate focus of hypermetabolism in the gastric fundus without underlying mass lesion evident. No hypermetabolic lymph nodes  in the abdomen. Upper normal lymph nodes in the low left para-aortic region, near the bifurcationuu measure up to 8 mm short axis (image 138/4). These lymph nodes show hypermetabolism with SUV max = 8.5. 7 mm short axis retrocaval lymph node seen on image 573/2 is hypermetabolic with SUV max = 5.3. Punctate foci of hypermetabolism are seen in the right pelvic sidewall, along the course of the right ureter. This activity appears to be distinct/separate from the right ureter and is suspicious for additional small hypermetabolic lymph nodes along the right common iliac chain (see 7 mm short axis node on 154/4). Hypermetabolism in the cervix persists but is decreased with SUV max = 6.5 today compared to 14.6 previously. Endometrial canal appears heterogeneous/irregular towards the uterine fundus, not well evaluated on noncontrast CT imaging. Incidental CT findings: Stable anterior hepatic cyst measured previously at 2.8 cm. Interval decrease in left adnexal cyst now measuring 14 mm on image 168/4. Tiny gas bubble identified in the bladder lumen, presumably secondary to recent instrumentation. The left hydroureteronephrosis seen previously is mildly progressive in the interval, again with dilatation down to the low right pelvis as the ureter passes by the cervix just proximal to the UVJ. SKELETON: Hypermetabolic right ischial lesion seen previously remains hypermetabolic today with SUV max = 4.3 today compared to 9.5 previously Incidental CT findings: none IMPRESSION: 1. Enlarging and new bilateral pulmonary nodules are evident today. Larger nodules in the right upper lung show FDG accumulation. Imaging features compatible with metastatic disease. 2. New hypermetabolic lymph nodes identified in the retrocaval space of the upper abdomen and para-aortic space of the lower abdomen, consistent with new metastatic involvement. There is some persistent hypermetabolism associated with small lymph nodes along the right common  iliac chain. 3. Interval decrease in hypermetabolism associated with the cervix. 4. Persistent, mildly progressive left hydroureteronephrosis. Left ureter is dilated down to the level it passes the cervix, just proximal to the UVJ. 5. Decreased hypermetabolism in the previously identified right ischial tuberosity lesion consistent with metastatic disease. 6. New punctate focus of hypermetabolism in the gastric fundus without underlying mass lesion evident by CT. Attention on follow-up recommended. 7. Tiny gas bubble in the bladder lumen is presumably secondary to recent instrumentation although bladder infection could have this appearance. Electronically Signed   By: Misty Stanley M.D.   On: 07/27/2020 12:55    I discussed the assessment and treatment plan with the patient. The patient was provided an opportunity to ask questions and all were answered. The patient agreed with the plan and demonstrated an understanding of the instructions. The patient was advised to call back or seek an in-person evaluation if the symptoms worsen or if the condition fails to improve as anticipated.    I spent 20  minutes for the appointment reviewing test results, discuss management and coordination of care.  [name], MD 08/04/2020 10:06 AM

## 2020-08-05 NOTE — Telephone Encounter (Signed)
Called [facility] and PD-L1 has not resulted yet.  They said it can be 6-10 days to get it back.

## 2020-08-06 ENCOUNTER — Other Ambulatory Visit: Payer: Self-pay | Admitting: Hematology and Oncology

## 2020-08-09 ENCOUNTER — Telehealth: Payer: Self-pay | Admitting: Oncology

## 2020-08-09 NOTE — Telephone Encounter (Signed)
[facility] and advised her that she can take tylenol and zyrtec for her sinus headache.  She verbalized understanding and agreement.

## 2020-08-16 ENCOUNTER — Encounter: Payer: Self-pay | Admitting: *Deleted

## 2020-08-17 ENCOUNTER — Inpatient Hospital Stay: Payer: Managed Care, Other (non HMO)

## 2020-08-17 ENCOUNTER — Other Ambulatory Visit: Payer: Self-pay

## 2020-08-17 ENCOUNTER — Inpatient Hospital Stay: Payer: Managed Care, Other (non HMO) | Attending: Gynecologic Oncology

## 2020-08-17 ENCOUNTER — Encounter: Payer: Self-pay | Admitting: Hematology and Oncology

## 2020-08-17 ENCOUNTER — Inpatient Hospital Stay (HOSPITAL_BASED_OUTPATIENT_CLINIC_OR_DEPARTMENT_OTHER): Payer: Managed Care, Other (non HMO) | Admitting: Hematology and Oncology

## 2020-08-17 ENCOUNTER — Other Ambulatory Visit: Payer: Self-pay | Admitting: Hematology and Oncology

## 2020-08-17 VITALS — BP 128/67 | HR 60 | Temp 98.4°F | Resp 20

## 2020-08-17 DIAGNOSIS — I10 Essential (primary) hypertension: Secondary | ICD-10-CM | POA: Diagnosis not present

## 2020-08-17 DIAGNOSIS — R5383 Other fatigue: Secondary | ICD-10-CM | POA: Diagnosis not present

## 2020-08-17 DIAGNOSIS — C539 Malignant neoplasm of cervix uteri, unspecified: Secondary | ICD-10-CM

## 2020-08-17 DIAGNOSIS — N1330 Unspecified hydronephrosis: Secondary | ICD-10-CM | POA: Diagnosis not present

## 2020-08-17 DIAGNOSIS — E039 Hypothyroidism, unspecified: Secondary | ICD-10-CM | POA: Diagnosis not present

## 2020-08-17 DIAGNOSIS — N189 Chronic kidney disease, unspecified: Secondary | ICD-10-CM | POA: Diagnosis not present

## 2020-08-17 DIAGNOSIS — Z5111 Encounter for antineoplastic chemotherapy: Secondary | ICD-10-CM | POA: Diagnosis present

## 2020-08-17 DIAGNOSIS — I129 Hypertensive chronic kidney disease with stage 1 through stage 4 chronic kidney disease, or unspecified chronic kidney disease: Secondary | ICD-10-CM | POA: Diagnosis present

## 2020-08-17 DIAGNOSIS — Z79899 Other long term (current) drug therapy: Secondary | ICD-10-CM | POA: Insufficient documentation

## 2020-08-17 DIAGNOSIS — R634 Abnormal weight loss: Secondary | ICD-10-CM | POA: Insufficient documentation

## 2020-08-17 DIAGNOSIS — C7951 Secondary malignant neoplasm of bone: Secondary | ICD-10-CM

## 2020-08-17 DIAGNOSIS — E860 Dehydration: Secondary | ICD-10-CM | POA: Diagnosis not present

## 2020-08-17 DIAGNOSIS — E876 Hypokalemia: Secondary | ICD-10-CM | POA: Diagnosis not present

## 2020-08-17 DIAGNOSIS — R609 Edema, unspecified: Secondary | ICD-10-CM | POA: Insufficient documentation

## 2020-08-17 DIAGNOSIS — R739 Hyperglycemia, unspecified: Secondary | ICD-10-CM | POA: Insufficient documentation

## 2020-08-17 DIAGNOSIS — D61818 Other pancytopenia: Secondary | ICD-10-CM | POA: Insufficient documentation

## 2020-08-17 DIAGNOSIS — Z7952 Long term (current) use of systemic steroids: Secondary | ICD-10-CM | POA: Insufficient documentation

## 2020-08-17 LAB — CBC WITH DIFFERENTIAL (CANCER CENTER ONLY)
Abs Immature Granulocytes: 0.27 10*3/uL — ABNORMAL HIGH (ref 0.00–0.07)
Basophils Absolute: 0 10*3/uL (ref 0.0–0.1)
Basophils Relative: 0 %
Eosinophils Absolute: 0 10*3/uL (ref 0.0–0.5)
Eosinophils Relative: 0 %
HCT: 28.9 % — ABNORMAL LOW (ref 36.0–46.0)
Hemoglobin: 9.6 g/dL — ABNORMAL LOW (ref 12.0–15.0)
Immature Granulocytes: 4 %
Lymphocytes Relative: 6 %
Lymphs Abs: 0.5 10*3/uL — ABNORMAL LOW (ref 0.7–4.0)
MCH: 31 pg (ref 26.0–34.0)
MCHC: 33.2 g/dL (ref 30.0–36.0)
MCV: 93.2 fL (ref 80.0–100.0)
Monocytes Absolute: 0.3 10*3/uL (ref 0.1–1.0)
Monocytes Relative: 4 %
Neutro Abs: 6.3 10*3/uL (ref 1.7–7.7)
Neutrophils Relative %: 86 %
Platelet Count: 100 10*3/uL — ABNORMAL LOW (ref 150–400)
RBC: 3.1 MIL/uL — ABNORMAL LOW (ref 3.87–5.11)
RDW: 15.3 % (ref 11.5–15.5)
WBC Count: 7.3 10*3/uL (ref 4.0–10.5)
nRBC: 0 % (ref 0.0–0.2)

## 2020-08-17 LAB — TOTAL PROTEIN, URINE DIPSTICK

## 2020-08-17 LAB — CMP (CANCER CENTER ONLY)
ALT: 13 U/L (ref 0–44)
AST: 11 U/L — ABNORMAL LOW (ref 15–41)
Albumin: 3.6 g/dL (ref 3.5–5.0)
Alkaline Phosphatase: 49 U/L (ref 38–126)
Anion gap: 11 (ref 5–15)
BUN: 18 mg/dL (ref 8–23)
CO2: 22 mmol/L (ref 22–32)
Calcium: 9.4 mg/dL (ref 8.9–10.3)
Chloride: 106 mmol/L (ref 98–111)
Creatinine: 1.34 mg/dL — ABNORMAL HIGH (ref 0.44–1.00)
GFR, Estimated: 45 mL/min — ABNORMAL LOW (ref >60)
Glucose, Bld: 211 mg/dL — ABNORMAL HIGH (ref 70–99)
Potassium: 3.7 mmol/L (ref 3.5–5.1)
Sodium: 139 mmol/L (ref 135–145)
Total Bilirubin: 0.3 mg/dL (ref 0.3–1.2)
Total Protein: 7.1 g/dL (ref 6.5–8.1)

## 2020-08-17 LAB — MAGNESIUM: Magnesium: 1.8 mg/dL (ref 1.7–2.4)

## 2020-08-17 LAB — TSH: TSH: 0.738 u[IU]/mL (ref 0.308–3.960)

## 2020-08-17 MED ORDER — HYDROCHLOROTHIAZIDE 25 MG PO TABS: 25.0000 mg | ORAL_TABLET | Freq: Every day | ORAL | 9 refills | Status: DC

## 2020-08-17 MED ORDER — PALONOSETRON HCL INJECTION 0.25 MG/5ML
INTRAVENOUS | Status: AC
  Filled 2020-08-17: qty 5

## 2020-08-17 MED ORDER — CARBOPLATIN CHEMO INJECTION 450 MG/45ML
372.0000 mg | Freq: Once | INTRAVENOUS | Status: AC
Start: 2020-08-17 — End: 2020-08-17
  Administered 2020-08-17: 370 mg via INTRAVENOUS
  Filled 2020-08-17: qty 37

## 2020-08-17 MED ORDER — SODIUM CHLORIDE 0.9 % IV SOLN
140.0000 mg/m2 | Freq: Once | INTRAVENOUS | Status: AC
  Administered 2020-08-17: 300 mg via INTRAVENOUS
  Filled 2020-08-17: qty 50

## 2020-08-17 MED ORDER — SODIUM CHLORIDE 0.9 % IV SOLN
2.0000 mg/kg | Freq: Once | INTRAVENOUS | Status: AC
  Administered 2020-08-17: 200 mg via INTRAVENOUS
  Filled 2020-08-17: qty 8

## 2020-08-17 MED ORDER — SODIUM CHLORIDE 0.9 % IV SOLN
15.0000 mg/kg | Freq: Once | INTRAVENOUS | Status: AC
Start: 2020-08-17 — End: 2020-08-17
  Administered 2020-08-17: 1500 mg via INTRAVENOUS
  Filled 2020-08-17: qty 12

## 2020-08-17 MED ORDER — SODIUM CHLORIDE 0.9 % IV SOLN
Freq: Once | INTRAVENOUS | Status: AC
  Filled 2020-08-17: qty 250

## 2020-08-17 MED ORDER — FAMOTIDINE IN NACL 20-0.9 MG/50ML-% IV SOLN
20.0000 mg | Freq: Once | INTRAVENOUS | Status: AC
  Administered 2020-08-17: 20 mg via INTRAVENOUS

## 2020-08-17 MED ORDER — HEPARIN SOD (PORK) LOCK FLUSH 100 UNIT/ML IV SOLN
500.0000 [IU] | Freq: Once | INTRAVENOUS | Status: AC | PRN
  Administered 2020-08-17: 500 [IU]
  Filled 2020-08-17: qty 5

## 2020-08-17 MED ORDER — FAMOTIDINE IN NACL 20-0.9 MG/50ML-% IV SOLN
INTRAVENOUS | Status: AC
  Filled 2020-08-17: qty 50

## 2020-08-17 MED ORDER — SODIUM CHLORIDE 0.9 % IV SOLN
10.0000 mg | Freq: Once | INTRAVENOUS | Status: AC
  Administered 2020-08-17: 10 mg via INTRAVENOUS
  Filled 2020-08-17: qty 10

## 2020-08-17 MED ORDER — SODIUM CHLORIDE 0.9 % IV SOLN
150.0000 mg | Freq: Once | INTRAVENOUS | Status: AC
  Administered 2020-08-17: 150 mg via INTRAVENOUS
  Filled 2020-08-17: qty 150

## 2020-08-17 MED ORDER — DIPHENHYDRAMINE HCL 50 MG/ML IJ SOLN
25.0000 mg | Freq: Once | INTRAMUSCULAR | Status: AC
  Administered 2020-08-17: 25 mg via INTRAVENOUS

## 2020-08-17 MED ORDER — DIPHENHYDRAMINE HCL 50 MG/ML IJ SOLN
INTRAMUSCULAR | Status: AC
  Filled 2020-08-17: qty 1

## 2020-08-17 MED ORDER — PALONOSETRON HCL INJECTION 0.25 MG/5ML
0.2500 mg | Freq: Once | INTRAVENOUS | Status: AC
  Administered 2020-08-17: 0.25 mg via INTRAVENOUS

## 2020-08-17 MED ORDER — SODIUM CHLORIDE 0.9% FLUSH
10.0000 mL | INTRAVENOUS | Status: DC | PRN
  Administered 2020-08-17: 10 mL
  Filled 2020-08-17: qty 10

## 2020-08-17 NOTE — Assessment & Plan Note (Signed)
She has slight worsening pancytopenia I plan to reduce the dose of carboplatin a little bit She does not need transfusion support

## 2020-08-17 NOTE — Patient Instructions (Addendum)
[facility] Discharge Instructions for Patients Receiving Chemotherapy  Today you received the following immunotherapy agents:  Pembrolizumab (Keytruda), and Bevacizumab, and chemotherapy agents: Paclitaxel (Taxol) and Carboplatin.  To help prevent nausea and vomiting after your treatment, we encourage you to take your nausea medication as directed by your MD.   If you develop nausea and vomiting that is not controlled by your nausea medication, call the clinic.   BELOW ARE SYMPTOMS THAT SHOULD BE REPORTED IMMEDIATELY:  *FEVER GREATER THAN 100.5 F  *CHILLS WITH OR WITHOUT FEVER  NAUSEA AND VOMITING THAT IS NOT CONTROLLED WITH YOUR NAUSEA MEDICATION  *UNUSUAL SHORTNESS OF BREATH  *UNUSUAL BRUISING OR BLEEDING  TENDERNESS IN MOUTH AND THROAT WITH OR WITHOUT PRESENCE OF ULCERS  *URINARY PROBLEMS  *BOWEL PROBLEMS  UNUSUAL RASH Items with * indicate a potential emergency and should be followed up as soon as possible.  Feel free to call the clinic should you have any questions or concerns. The clinic phone number is [phone].  Please show the [facility] at check-in to the Emergency Department and triage nurse.  Pembrolizumab injection What is this medicine? PEMBROLIZUMAB (pem broe liz ue mab) is a monoclonal antibody. It is used to treat certain types of cancer. This medicine may be used for other purposes; ask your health care provider or pharmacist if you have questions. COMMON BRAND NAME(S): Keytruda What should I tell my health care provider before I take this medicine? They need to know if you have any of these conditions:  autoimmune diseases like Crohn's disease, ulcerative colitis, or lupus  have had or planning to have an allogeneic stem cell transplant (uses someone else's stem cells)  history of organ transplant  history of chest radiation  nervous system problems like myasthenia gravis or Guillain-Barre syndrome  an unusual or  allergic reaction to pembrolizumab, other medicines, foods, dyes, or preservatives  pregnant or trying to get pregnant  breast-feeding How should I use this medicine? This medicine is for infusion into a vein. It is given by a health care professional in a hospital or clinic setting. A special MedGuide will be given to you before each treatment. Be sure to read this information carefully each time. Talk to your pediatrician regarding the use of this medicine in children. While this drug may be prescribed for children as young as 6 months for selected conditions, precautions do apply. Overdosage: If you think you have taken too much of this medicine contact a poison control center or emergency room at once. NOTE: This medicine is only for you. Do not share this medicine with others. What if I miss a dose? It is important not to miss your dose. Call your doctor or health care professional if you are unable to keep an appointment. What may interact with this medicine? Interactions have not been studied. This list may not describe all possible interactions. Give your health care provider a list of all the medicines, herbs, non-prescription drugs, or dietary supplements you use. Also tell them if you smoke, drink alcohol, or use illegal drugs. Some items may interact with your medicine. What should I watch for while using this medicine? Your condition will be monitored carefully while you are receiving this medicine. You may need blood work done while you are taking this medicine. Do not become pregnant while taking this medicine or for 4 months after stopping it. Women should inform their doctor if they wish to become pregnant or think they might be pregnant.  There is a potential for serious side effects to an unborn child. Talk to your health care professional or pharmacist for more information. Do not breast-feed an infant while taking this medicine or for 4 months after the last dose. What side  effects may I notice from receiving this medicine? Side effects that you should report to your doctor or health care professional as soon as possible:  allergic reactions like skin rash, itching or hives, swelling of the face, lips, or tongue  bloody or black, tarry  breathing problems  changes in vision  chest pain  chills  confusion  constipation  cough  diarrhea  dizziness or feeling faint or lightheaded  fast or irregular heartbeat  fever  flushing  joint pain  low blood counts - this medicine may decrease the number of white blood cells, red blood cells and platelets. You may be at increased risk for infections and bleeding.  muscle pain  muscle weakness  pain, tingling, numbness in the hands or feet  persistent headache  redness, blistering, peeling or loosening of the skin, including inside the mouth  signs and symptoms of high blood sugar such as dizziness; dry mouth; dry skin; fruity breath; nausea; stomach pain; increased hunger or thirst; increased urination  signs and symptoms of kidney injury like trouble passing urine or change in the amount of urine  signs and symptoms of liver injury like dark urine, light-colored stools, loss of appetite, nausea, right upper belly pain, yellowing of the eyes or skin  sweating  swollen lymph nodes  weight loss Side effects that usually do not require medical attention (report to your doctor or health care professional if they continue or are bothersome):  decreased appetite  hair loss  tiredness This list may not describe all possible side effects. Call your doctor for medical advice about side effects. You may report side effects to FDA at 1-800-FDA-1088. Where should I keep my medicine? This drug is given in a hospital or clinic and will not be stored at home. NOTE: This sheet is a summary. It may not cover all possible information. If you have questions about this medicine, talk to your doctor,  pharmacist, or health care provider.  2021 Elsevier/Gold Standard (2019-05-07 21:44:53)

## 2020-08-17 NOTE — Progress Notes (Signed)
[place] OFFICE PROGRESS NOTE  Patient Care Team: [name], MD as PCP - General (Family Medicine) Phyllis Gell, MD as Consulting Physician (Obstetrics and Gynecology) [name], RN as Oncology Nurse Navigator (Oncology)  ASSESSMENT & PLAN:  Cervical cancer, FIGO stage IVB ([place]) I gave her a copy of PD-L1 testing We are adding pembrolizumab to her treatment today I noted that the patient has gained some weight and with elevated blood pressure I will schedule a virtual visit next week for aggressive blood pressure management We will proceed with treatment as scheduled today  Essential hypertension Her blood pressure is elevated could be due to side effects of bevacizumab Due to fluid retention, I recommend diuretic therapy She is instructed to check her blood pressure twice a day I will set up virtual visit next week for medication adjustment  Pancytopenia, acquired ([place]) She has slight worsening pancytopenia I plan to reduce the dose of carboplatin a little bit She does not need transfusion support   No orders of the defined types were placed in this encounter.   All questions were answered. The patient knows to call the clinic with any problems, questions or concerns. The total time spent in the appointment was 30 minutes encounter with patients including review of chart and various tests results, discussions about plan of care and coordination of care plan   [name], MD 08/17/2020 12:07 PM  INTERVAL HISTORY: Please see below for problem oriented charting. She returns for cycle 2 of treatment today She is feeling okay Some fatigue Noticed some bruising She has gained 5 pounds of fluid even though she is not eating much She has mild residual neuropathy but not severe She denies pain Her blood pressure is noted to be elevated at home, systolic blood pressure between 150-160 and heart rate in the 70s She noticed some bruises around her  ankle  SUMMARY OF ONCOLOGIC HISTORY: Oncology History Overview Note  PD-L1 CPS: 2   Cervical cancer, FIGO stage IVB ([place])  03/16/2020 Initial Diagnosis   The patient had history of new onset vaginal spotting on March 16, 2020.  She immediately called her OB/GYN's office and achieved an appointment with her OB/GYN's partner, Dr. Murrell Redden, who saw and evaluated the patient on March 19, 2020.  At that time a transvaginal ultrasound scan was performed which revealed a uterus measuring 5.3 x 8.4 x 4.6 cm with an ill-defined endometrium.  There is a complex cyst on the left ovary measuring 2.6 cm with peripheral blood flow noted.  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  Immunostains were positive for CEA and CDX2, negative for p16, ER, vimentin, and PAX8.  The differential diagnosis included primary gastric type endocervical adenocarcinoma (though this typically stains positive for PAX8) or a metastasis from pancreaticobiliary or upper GI primary.   03/29/2020 Pathology Results   1. Surgical [P], duodenum - PEPTIC DUODENITIS. - NO DYSPLASIA OR MALIGNANCY. 2. Surgical [P], gastric antrum and gastric body - REACTIVE GASTROPATHY. [place] IS NEGATIVE FOR HELICOBACTER PYLORI. - NO INTESTINAL METAPLASIA, DYSPLASIA, OR MALIGNANCY. 3. Surgical [P], gastric nodule - MILD REACTIVE GASTROPATHY. - NO INTESTINAL METAPLASIA, DYSPLASIA, OR MALIGNANCY. 4. Surgical [P], stomach, lesser curve ulcers - REACTIVE GASTROPATHY WITH EROSIONS. - NO INTESTINAL METAPLASIA, DYSPLASIA, OR MALIGNANCY. 5. Surgical [P], gastric polyps - HYPERPLASTIC POLYP. - NO INTESTINAL METAPLASIA, DYSPLASIA OR MALIGNANCY. 6. Surgical [P], colon, descending, polyp -  HYPERPLASTIC POLYP. - NO DYSPLASIA OR MALIGNANCY.   04/06/2020 Procedure   EGD report  Normal esophagus. - A few gastric polyps. Suspected  fundic gland polyps. - Non-bleeding gastric ulcers with pigmented material. Biopsied. - A single gastric polyp/nodule. Resected and retrieved. - Erythematous duodenopathy. Biopsied. - The examination was otherwise normal.   04/06/2020 Procedure   Colonoscopy report - Non-bleeding internal hemorrhoids. - One 4 mm polyp in the descending colon, removed with a cold snare. Resected and retrieved. - The examination was otherwise normal on direct and retroflexion views   04/19/2020 PET scan   1. Hypermetabolic uterine cervix mass compatible with known primary cervical malignancy, with hypermetabolism extending into the upper third of the vagina and extending throughout the uterine body. No frank extrauterine extension. 2. Mild to moderate left hydroureteronephrosis to the level of the left UVJ, concerning for tumor involvement of the left UVJ. 3. Hypermetabolic right common iliac nodal metastasis. 4. Hypermetabolic sclerotic right ischial bone metastasis. 5. Two indeterminate small scattered solid pulmonary nodules, below PET resolution, warranting attention on chest CT follow-up in 3 months. 6.  Aortic Atherosclerosis (ICD10-I70.0).   04/19/2020 Cancer Staging   Staging form: Cervix Uteri, AJCC Version 9 - Clinical stage from 04/19/2020: FIGO Stage IVB (cT3b, cN1a, cM1) - Signed by [name], MD on 04/19/2020   04/26/2020 Procedure   Successful placement of a right IJ approach Power Port with ultrasound and fluoroscopic guidance. The catheter is ready for use   04/30/2020 -  Chemotherapy   The patient had cisplatin for chemotherapy treatment.     06/20/2020 Genetic Testing   Negative genetic testing on the CancerNext-Expanded+RNAinsight panel.  The CancerNext-Expanded gene panel offered by [facility] and includes sequencing and rearrangement analysis for the following 77 genes: AIP, ALK, APC*, ATM*, AXIN2, BAP1, BARD1, BLM, BMPR1A, BRCA1*, BRCA2*, BRIP1*, CDC73, CDH1*, CDK4, CDKN1B, CDKN2A,  CHEK2*, CTNNA1, DICER1, FANCC, FH, FLCN, GALNT12, KIF1B, LZTR1, MAX, MEN1, MET, MLH1*, MSH2*, MSH3, MSH6*, MUTYH*, NBN, NF1*, NF2, NTHL1, PALB2*, PHOX2B, PMS2*, POT1, PRKAR1A, PTCH1, PTEN*, RAD51C*, RAD51D*, RB1, RECQL, RET, SDHA, SDHAF2, SDHB, SDHC, SDHD, SMAD4, SMARCA4, SMARCB1, SMARCE1, STK11, SUFU, TMEM127, TP53*, TSC1, TSC2, VHL and XRCC2 (sequencing and deletion/duplication); EGFR, EGLN1, HOXB13, KIT, MITF, PDGFRA, POLD1, and POLE (sequencing only); EPCAM and GREM1 (deletion/duplication only). DNA and RNA analyses performed for * genes. The report date is June 20, 2020.   07/28/2020 -  Chemotherapy    Patient is on Treatment Plan: CERVICAL PEMBROLIZUMAB/CARBOPLATIN + PACLITAXEL + BEVACIZUMAB Q21D       Metastasis to bone ([place])  04/19/2020 Initial Diagnosis   Metastasis to bone ([place])   07/28/2020 -  Chemotherapy    Patient is on Treatment Plan: CERVICAL PEMBROLIZUMAB/CARBOPLATIN + PACLITAXEL + BEVACIZUMAB Q21D         REVIEW OF SYSTEMS:   Constitutional: Denies fevers, chills or abnormal weight loss Eyes: Denies blurriness of vision Ears, nose, mouth, throat, and face: Denies mucositis or sore throat Respiratory: Denies cough, dyspnea or wheezes Cardiovascular: Denies palpitation, chest discomfort or lower extremity swelling Gastrointestinal:  Denies nausea, heartburn or change in bowel habits Skin: Denies abnormal skin rashes Lymphatics: Denies new lymphadenopathy or easy bruising Neurological:Denies numbness, tingling or new weaknesses Behavioral/Psych: Mood is stable, no new changes  All other systems were reviewed with the patient and are negative.  I have reviewed the past medical history, past surgical history, social history and family history with the patient and they are unchanged from previous note.  ALLERGIES:  is allergic to penicillins.  MEDICATIONS:  Current Outpatient Medications  Medication Sig Dispense Refill  . hydrochlorothiazide (HYDRODIURIL) 25 MG tablet  Take 1 tablet (25 mg total) by mouth daily. 30 tablet 9  . calcium carbonate (TUMS - DOSED IN MG ELEMENTAL CALCIUM) 500 MG chewable tablet Chew 1 tablet by mouth 2 (two) times daily.    . Cholecalciferol (VITAMIN D3) 2000 units TABS Take 2 tablets by mouth daily.    . clobetasol cream (TEMOVATE) 0.05 % Apply topically as needed. Left leg    . dexamethasone (DECADRON) 4 MG tablet Take 4 mg by mouth as directed.    . fenofibrate 160 MG tablet TAKE 1 TABLET BY MOUTH EVERY DAY (Patient taking differently: Take 160 mg by mouth daily with lunch.) 30 tablet 5  . lidocaine-prilocaine (EMLA) cream Apply to affected area once 30 g 3  . LORazepam (ATIVAN) 0.5 MG tablet Take 1 tablet (0.5 mg total) by mouth every 8 (eight) hours as needed for anxiety. 30 tablet 0  . ondansetron (ZOFRAN) 8 MG tablet Take 1 tablet (8 mg total) by mouth every 8 (eight) hours as needed. Start on the third day after cisplatin chemotherapy. 30 tablet 1  . pantoprazole (PROTONIX) 40 MG tablet Take 1 tablet (40 mg total) by mouth 2 (two) times daily. 90 tablet 3  . prochlorperazine (COMPAZINE) 10 MG tablet Take 1 tablet (10 mg total) by mouth every 6 (six) hours as needed (Nausea or vomiting). 30 tablet 1  . traMADol (ULTRAM) 50 MG tablet Take 2 tablets (100 mg total) by mouth every 6 (six) hours as needed. 60 tablet 0   No current facility-administered medications for this visit.   Facility-Administered Medications Ordered in Other Visits  Medication Dose Route Frequency Provider Last Rate Last Admin  . bevacizumab-bvzr (ZIRABEV) 1,500 mg in sodium chloride 0.9 % 100 mL chemo infusion  15 mg/kg (Treatment Plan Recorded) Intravenous Once Alvy Bimler, Ni, MD      . CARBOplatin (PARAPLATIN) 370 mg in sodium chloride 0.9 % 100 mL chemo infusion  370 mg Intravenous Once Alvy Bimler, Ni, MD      . dexamethasone (DECADRON) 10 mg in sodium chloride 0.9 % 50 mL IVPB  10 mg Intravenous Once Alvy Bimler, Ni, MD      . famotidine (PEPCID) IVPB 20 mg  premix  20 mg Intravenous Once Alvy Bimler, Ni, MD 200 mL/hr at 08/17/20 1203 20 mg at 08/17/20 1203  . fosaprepitant (EMEND) 150 mg in sodium chloride 0.9 % 145 mL IVPB  150 mg Intravenous Once Alvy Bimler, Ni, MD      . heparin lock flush 100 unit/mL  500 Units Intracatheter Once PRN Alvy Bimler, Ni, MD      . PACLitaxel (TAXOL) 300 mg in sodium chloride 0.9 % 250 mL chemo infusion (> 34m/m2)  140 mg/m2 (Treatment Plan Recorded) Intravenous Once GAlvy Bimler Ni, MD      . pembrolizumab (KEYTRUDA) 200 mg in sodium chloride 0.9 % 50 mL chemo infusion  2 mg/kg (Treatment Plan Recorded) Intravenous Once Gorsuch, Ni, MD      . sodium chloride flush (NS) 0.9 % injection 10 mL  10 mL Intracatheter PRN GAlvy Bimler Ni, MD        PHYSICAL EXAMINATION: ECOG PERFORMANCE STATUS: 1 - Symptomatic but completely ambulatory  Vitals:   08/17/20 1033  BP: (!) 159/75  Pulse: 81  Resp: 18  Temp: 97.6 F (36.4 C)  SpO2: 100%   Filed Weights   08/17/20 1033  Weight: 220 lb 12.8 oz (100.2 kg)  GENERAL:alert, no distress and comfortable SKIN: skin color, texture, turgor are normal, no rashes or significant lesions EYES: normal, Conjunctiva are pink and non-injected, sclera clear OROPHARYNX:no exudate, no erythema and lips, buccal mucosa, and tongue normal  NECK: supple, thyroid normal size, non-tender, without nodularity LYMPH:  no palpable lymphadenopathy in the cervical, axillary or inguinal LUNGS: clear to auscultation and percussion with normal breathing effort HEART: regular rate & rhythm and no murmurs and no lower extremity edema ABDOMEN:abdomen soft, non-tender and normal bowel sounds Musculoskeletal:no cyanosis of digits and no clubbing  NEURO: alert & oriented x 3 with fluent speech, no focal motor/sensory deficits  LABORATORY DATA:  I have reviewed the data as listed    Component Value Date/Time   NA 139 08/17/2020 1011   NA 140 06/21/2016 1530   K 3.7 08/17/2020 1011   CL 106 08/17/2020 1011   CO2  22 08/17/2020 1011   GLUCOSE 211 (H) 08/17/2020 1011   BUN 18 08/17/2020 1011   BUN 16 06/21/2016 1530   CREATININE 1.34 (H) 08/17/2020 1011   CREATININE 0.87 07/02/2017 1549   CALCIUM 9.4 08/17/2020 1011   PROT 7.1 08/17/2020 1011   PROT 7.3 06/21/2016 1530   ALBUMIN 3.6 08/17/2020 1011   ALBUMIN 4.3 06/21/2016 1530   AST 11 (L) 08/17/2020 1011   ALT 13 08/17/2020 1011   ALKPHOS 49 08/17/2020 1011   BILITOT 0.3 08/17/2020 1011   GFRNONAA 45 (L) 08/17/2020 1011   GFRAA 111 06/21/2016 1530    No results found for: SPEP, UPEP  Lab Results  Component Value Date   WBC 7.3 08/17/2020   NEUTROABS 6.3 08/17/2020   HGB 9.6 (L) 08/17/2020   HCT 28.9 (L) 08/17/2020   MCV 93.2 08/17/2020   PLT 100 (L) 08/17/2020      Chemistry      Component Value Date/Time   NA 139 08/17/2020 1011   NA 140 06/21/2016 1530   K 3.7 08/17/2020 1011   CL 106 08/17/2020 1011   CO2 22 08/17/2020 1011   BUN 18 08/17/2020 1011   BUN 16 06/21/2016 1530   CREATININE 1.34 (H) 08/17/2020 1011   CREATININE 0.87 07/02/2017 1549      Component Value Date/Time   CALCIUM 9.4 08/17/2020 1011   ALKPHOS 49 08/17/2020 1011   AST 11 (L) 08/17/2020 1011   ALT 13 08/17/2020 1011   BILITOT 0.3 08/17/2020 1011       RADIOGRAPHIC STUDIES: I have personally reviewed the radiological images as listed and agreed with the findings in the report. [place] Intraoperative  Result Date: 07/19/2020 CLINICAL DATA:  Ultrasound was provided for use by the ordering physician.  No provider Interpretation or professional fees incurred.    NM PET Image Restage (PS) Skull Base to Thigh  Result Date: 07/27/2020 CLINICAL DATA:  Subsequent treatment strategy for cervical cancer. EXAM: NUCLEAR MEDICINE PET SKULL BASE TO THIGH TECHNIQUE: 10.6 mCi F-18 FDG was injected intravenously. Full-ring PET imaging was performed from the skull base to thigh after the radiotracer. CT data was obtained and used for attenuation correction and  anatomic localization. Fasting blood glucose: 104 mg/dl COMPARISON:  04/19/2020 FINDINGS: Mediastinal blood pool activity: SUV max 3.0 Liver activity: SUV max NA NECK: No hypermetabolic lymph nodes in the neck. Incidental CT findings: none CHEST: New and enlarging bilateral pulmonary nodules are identified. 10 mm nodule in the suprahilar right upper lobe (image 63/series 4) is new since prior and demonstrates SUV max = 3.3. 11 mm subpleural nodule  in the right apex (57/4) has increased from 6 mm previously. This nodule also shows low level FDG uptake with SUV max = 2.0. Remaining bilateral pulmonary nodules are tiny, below size threshold for reliable resolution on PET imaging. 6-7 mm medial right upper lobe nodule on 63/4 is new in the interval. 3 mm right lower lobe nodule on 79/4 is new since prior. 2-3 mm left upper lobe nodule on 69/4 is new since prior. Incidental CT findings: Right Port-A-Cath tip is positioned in the upper right atrium. ABDOMEN/PELVIS: No abnormal hypermetabolic activity within the liver, pancreas, adrenal glands, or spleen. Punctate focus of hypermetabolism in the gastric fundus without underlying mass lesion evident. No hypermetabolic lymph nodes in the abdomen. Upper normal lymph nodes in the low left para-aortic region, near the bifurcationuu measure up to 8 mm short axis (image 138/4). These lymph nodes show hypermetabolism with SUV max = 8.5. 7 mm short axis retrocaval lymph node seen on image 592/9 is hypermetabolic with SUV max = 5.3. Punctate foci of hypermetabolism are seen in the right pelvic sidewall, along the course of the right ureter. This activity appears to be distinct/separate from the right ureter and is suspicious for additional small hypermetabolic lymph nodes along the right common iliac chain (see 7 mm short axis node on 154/4). Hypermetabolism in the cervix persists but is decreased with SUV max = 6.5 today compared to 14.6 previously. Endometrial canal appears  heterogeneous/irregular towards the uterine fundus, not well evaluated on noncontrast CT imaging. Incidental CT findings: Stable anterior hepatic cyst measured previously at 2.8 cm. Interval decrease in left adnexal cyst now measuring 14 mm on image 168/4. Tiny gas bubble identified in the bladder lumen, presumably secondary to recent instrumentation. The left hydroureteronephrosis seen previously is mildly progressive in the interval, again with dilatation down to the low right pelvis as the ureter passes by the cervix just proximal to the UVJ. SKELETON: Hypermetabolic right ischial lesion seen previously remains hypermetabolic today with SUV max = 4.3 today compared to 9.5 previously Incidental CT findings: none IMPRESSION: 1. Enlarging and new bilateral pulmonary nodules are evident today. Larger nodules in the right upper lung show FDG accumulation. Imaging features compatible with metastatic disease. 2. New hypermetabolic lymph nodes identified in the retrocaval space of the upper abdomen and para-aortic space of the lower abdomen, consistent with new metastatic involvement. There is some persistent hypermetabolism associated with small lymph nodes along the right common iliac chain. 3. Interval decrease in hypermetabolism associated with the cervix. 4. Persistent, mildly progressive left hydroureteronephrosis. Left ureter is dilated down to the level it passes the cervix, just proximal to the UVJ. 5. Decreased hypermetabolism in the previously identified right ischial tuberosity lesion consistent with metastatic disease. 6. New punctate focus of hypermetabolism in the gastric fundus without underlying mass lesion evident by CT. Attention on follow-up recommended. 7. Tiny gas bubble in the bladder lumen is presumably secondary to recent instrumentation although bladder infection could have this appearance. Electronically Signed   By: Misty Stanley M.D.   On: 07/27/2020 12:55

## 2020-08-17 NOTE — Assessment & Plan Note (Signed)
Her blood pressure is elevated could be due to side effects of bevacizumab Due to fluid retention, I recommend diuretic therapy She is instructed to check her blood pressure twice a day I will set up virtual visit next week for medication adjustment

## 2020-08-17 NOTE — Assessment & Plan Note (Signed)
I gave her a copy of PD-L1 testing We are adding pembrolizumab to her treatment today I noted that the patient has gained some weight and with elevated blood pressure I will schedule a virtual visit next week for aggressive blood pressure management We will proceed with treatment as scheduled today

## 2020-08-18 ENCOUNTER — Telehealth: Payer: Self-pay | Admitting: *Deleted

## 2020-08-18 NOTE — Telephone Encounter (Signed)
-----   Message from Lester Brimhall Nizhoni, RN sent at 08/17/2020  6:27 PM EST ----- Regarding: Dr. Alvy Bimler Pt. first time [facility]. Tolerated treatment well without any issues. Also receives Bevacizumab, Taxol, and Carbo.

## 2020-08-18 NOTE — Telephone Encounter (Signed)
Called pt to see how she did with her treatment yesterday.  She reports doing well & denies any concerns & knows how to reach [place] if needed.

## 2020-08-19 ENCOUNTER — Telehealth: Payer: Self-pay

## 2020-08-19 ENCOUNTER — Other Ambulatory Visit: Payer: Self-pay

## 2020-08-19 ENCOUNTER — Encounter: Payer: Self-pay | Admitting: Radiation Oncology

## 2020-08-19 ENCOUNTER — Ambulatory Visit
Admission: RE | Admit: 2020-08-19 | Discharge: 2020-08-19 | Disposition: A | Discharge status: Home / Self Care | Payer: Managed Care, Other (non HMO) | Source: Ambulatory Visit | Attending: Radiation Oncology | Admitting: Radiation Oncology

## 2020-08-19 VITALS — BP 157/76 | HR 42 | Temp 97.0°F | Resp 18 | Ht 65.0 in | Wt 221.1 lb

## 2020-08-19 DIAGNOSIS — C539 Malignant neoplasm of cervix uteri, unspecified: Secondary | ICD-10-CM

## 2020-08-19 DIAGNOSIS — C7951 Secondary malignant neoplasm of bone: Secondary | ICD-10-CM | POA: Insufficient documentation

## 2020-08-19 NOTE — Progress Notes (Signed)
Patient in for follow up. Doing well states she does still have a discharge occasionally. Her pulse is in the 40's and has been since chemo she is a little short of breath but this is not unusual for after chemo. She is not having any pain. No other issues. BP (!) 157/76 (BP Location: Left Arm, Patient Position: Sitting)   Pulse (!) 42   Temp (!) 97 F (36.1 C)   Resp 18   Ht 5\' 5"  (1.651 m)   Wt 221 lb 2 oz (100.3 kg)   SpO2 100%   BMI 36.80 kg/m

## 2020-08-19 NOTE — Telephone Encounter (Signed)
-----   Message from [name], MD sent at 08/19/2020 12:12 PM EST ----- Regarding: call her this afternoon I do not see any medications that cause bradycardia Apparently when she saw Dr. Sondra Come her HR in the 40s I recommend she continue checking her bP and HR next few days We have set up virtual visit next week already

## 2020-08-19 NOTE — Progress Notes (Incomplete)
  Patient Name: Phyllis Hebert MRN: [id number] DOB: [birth year] Referring Physician: Annye Asa (Profile Not Attached) Date of Service: 07/19/2020 [facility], [place]                                                        End Of Treatment Note  Diagnoses: C79.51-Secondary malignant neoplasm of bone  Cancer Staging: Stage IVB (cT3b, cN1a, cM1) adenocarcinoma of the cervix  Intent: Curative  Radiation Treatment Dates: 05/03/2020 through 07/19/2020  Site: Cervix Technique: IMRT Total Dose (Gy): 45/45 Dose per Fx (Gy): 1.8 Completed Fx: 25/25 Beam Energies: 6X  Site: Cervix boost Technique: 3D Total Dose (Gy): 9/9 Dose per Fx (Gy): 1.8 Completed Fx: 5/5 Beam Energies: 15X  Site: Cervix boost Technique: HDR-brachy Total Dose (Gy): 5.5 Dose per Fx (Gy): 5.5 Completed Fx: 5/5 Beam Energies: Ir-192  Narrative: The patient tolerated radiation therapy relatively well. She did report some pelvic pressure, mild nausea, ongoing fatigue, mild diarrhea controlled with Miralax, one episode of spotting and one episode of vaginal bleeding, shortness of breath, and constipation. She denied vomiting, hematuria, dysuria, urinary urgency, rectal bleeding, skin changes, and vaginal discharge.  Plan: The patient will follow-up with radiation oncology in one month.  ________________________________________________   Blair Promise, PhD, MD  This document serves as a record of services personally performed by Gery Pray, MD. It was created on his behalf by Clerance Lav, a trained medical scribe. The creation of this record is based on the scribe's personal observations and the provider's statements to them. This document has been checked and approved by the attending provider.

## 2020-08-19 NOTE — Telephone Encounter (Signed)
Called and given below message. She verbalized understanding. She will continue to check bp/ HR at home. Denies dizziness. nstructed to call the office for concerns or go to the ER. She verbalized understanding.

## 2020-08-19 NOTE — Progress Notes (Incomplete)
Radiation Oncology         [phone] ________________________________  Name: Phyllis Hebert MRN: [id number]  Date: 08/19/2020  DOB: 02-23-1956  Follow-Up Visit Note  CC: [name], MD  [name], MD  No diagnosis found.  Diagnosis: Stage IVB (cT3b, cN1a, cM1) adenocarcinoma of the cervix  Interval Since Last Radiation: Four weeks and three days  Radiation Treatment Dates: 05/03/2020 through 07/19/2020  Site: Cervix Technique: IMRT Total Dose (Gy): 45/45 Dose per Fx (Gy): 1.8 Completed Fx: 25/25 Beam Energies: 6X  Site: Cervix boost Technique: 3D Total Dose (Gy): 9/9 Dose per Fx (Gy): 1.8 Completed Fx: 5/5 Beam Energies: 15X  Site: Cervix boost Technique: HDR-brachy Total Dose (Gy): 5.5 Dose per Fx (Gy): 5.5 Completed Fx: 5/5 Beam Energies: Ir-192  Narrative:  The patient returns today for routine follow-up. PET scan on 07/27/2020 showed enlarging and new bilateral pulmonary nodules. The larger nodules in the right upper lung showed FDG accumulation. Imaging features were compatible with metastatic disease. There was also noted to be new hypermetabolic lymph nodes identified in the retrocaval space of the upper abdomen and para-aortic space of the lower abdomen, consistent with new metastatic involvement. There was some persistent hypermetabolism associated with small lymph nodes along the right common iliac chain. There was interval decrease in hypermetabolism associated with the cervix. Additionally, there was persistent, mildly progressive left hydroureteronephrosis. Finally, there was decreased hypermetabolism in the previously identified right ischial tuberosity lesion consistent with metastatic disease, new punctate focus of hypermetabolism in the gastric fundus without underlying mass lesion, and a tiny gas bubble in the bladder lumen that was presumably secondary to recent instrumentation although bladder infection could also have that  appearance.  She began chemotherapy treatment with cervical Pembrolizumab/Carboplatin, Paclitaxel, and Bevacizumab under the care of Dr. Alvy Bimler. She has tolerated treatment relatively well with the exception of elevated blood pressures and pancytopenia.                 On review of systems, she reports ***. She denies ***.  ALLERGIES:  is allergic to penicillins.  Meds: Current Outpatient Medications  Medication Sig Dispense Refill  . calcium carbonate (TUMS - DOSED IN MG ELEMENTAL CALCIUM) 500 MG chewable tablet Chew 1 tablet by mouth 2 (two) times daily.    . Cholecalciferol (VITAMIN D3) 2000 units TABS Take 2 tablets by mouth daily.    . clobetasol cream (TEMOVATE) 0.05 % Apply topically as needed. Left leg    . dexamethasone (DECADRON) 4 MG tablet Take 4 mg by mouth as directed.    . fenofibrate 160 MG tablet TAKE 1 TABLET BY MOUTH EVERY DAY (Patient taking differently: Take 160 mg by mouth daily with lunch.) 30 tablet 5  . hydrochlorothiazide (HYDRODIURIL) 25 MG tablet Take 1 tablet (25 mg total) by mouth daily. 30 tablet 9  . lidocaine-prilocaine (EMLA) cream Apply to affected area once 30 g 3  . LORazepam (ATIVAN) 0.5 MG tablet Take 1 tablet (0.5 mg total) by mouth every 8 (eight) hours as needed for anxiety. 30 tablet 0  . ondansetron (ZOFRAN) 8 MG tablet Take 1 tablet (8 mg total) by mouth every 8 (eight) hours as needed. Start on the third day after cisplatin chemotherapy. 30 tablet 1  . pantoprazole (PROTONIX) 40 MG tablet Take 1 tablet (40 mg total) by mouth 2 (two) times daily. 90 tablet 3  . prochlorperazine (COMPAZINE) 10 MG tablet Take 1 tablet (10 mg total) by mouth every 6 (six) hours  as needed (Nausea or vomiting). 30 tablet 1  . traMADol (ULTRAM) 50 MG tablet Take 2 tablets (100 mg total) by mouth every 6 (six) hours as needed. 60 tablet 0   No current facility-administered medications for this encounter.    Physical Findings: The patient is in no acute distress.  Patient is alert and oriented.  vitals were not taken for this visit.  No significant changes. Lungs are clear to auscultation bilaterally. Heart has regular rate and rhythm. No palpable cervical, supraclavicular, or axillary adenopathy. Abdomen soft, non-tender, normal bowel sounds. Pelvic exam deferred in light of recent radiation therapy. ***  Lab Findings: Lab Results  Component Value Date   WBC 7.3 08/17/2020   HGB 9.6 (L) 08/17/2020   HCT 28.9 (L) 08/17/2020   MCV 93.2 08/17/2020   PLT 100 (L) 08/17/2020    Radiographic Findings: NM PET Image Restage (PS) Skull Base to Thigh  Result Date: 07/27/2020 CLINICAL DATA:  Subsequent treatment strategy for cervical cancer. EXAM: NUCLEAR MEDICINE PET SKULL BASE TO THIGH TECHNIQUE: 10.6 mCi F-18 FDG was injected intravenously. Full-ring PET imaging was performed from the skull base to thigh after the radiotracer. CT data was obtained and used for attenuation correction and anatomic localization. Fasting blood glucose: 104 mg/dl COMPARISON:  04/19/2020 FINDINGS: Mediastinal blood pool activity: SUV max 3.0 Liver activity: SUV max NA NECK: No hypermetabolic lymph nodes in the neck. Incidental CT findings: none CHEST: New and enlarging bilateral pulmonary nodules are identified. 10 mm nodule in the suprahilar right upper lobe (image 63/series 4) is new since prior and demonstrates SUV max = 3.3. 11 mm subpleural nodule in the right apex (57/4) has increased from 6 mm previously. This nodule also shows low level FDG uptake with SUV max = 2.0. Remaining bilateral pulmonary nodules are tiny, below size threshold for reliable resolution on PET imaging. 6-7 mm medial right upper lobe nodule on 63/4 is new in the interval. 3 mm right lower lobe nodule on 79/4 is new since prior. 2-3 mm left upper lobe nodule on 69/4 is new since prior. Incidental CT findings: Right Port-A-Cath tip is positioned in the upper right atrium. ABDOMEN/PELVIS: No abnormal  hypermetabolic activity within the liver, pancreas, adrenal glands, or spleen. Punctate focus of hypermetabolism in the gastric fundus without underlying mass lesion evident. No hypermetabolic lymph nodes in the abdomen. Upper normal lymph nodes in the low left para-aortic region, near the bifurcationuu measure up to 8 mm short axis (image 138/4). These lymph nodes show hypermetabolism with SUV max = 8.5. 7 mm short axis retrocaval lymph node seen on image 026/3 is hypermetabolic with SUV max = 5.3. Punctate foci of hypermetabolism are seen in the right pelvic sidewall, along the course of the right ureter. This activity appears to be distinct/separate from the right ureter and is suspicious for additional small hypermetabolic lymph nodes along the right common iliac chain (see 7 mm short axis node on 154/4). Hypermetabolism in the cervix persists but is decreased with SUV max = 6.5 today compared to 14.6 previously. Endometrial canal appears heterogeneous/irregular towards the uterine fundus, not well evaluated on noncontrast CT imaging. Incidental CT findings: Stable anterior hepatic cyst measured previously at 2.8 cm. Interval decrease in left adnexal cyst now measuring 14 mm on image 168/4. Tiny gas bubble identified in the bladder lumen, presumably secondary to recent instrumentation. The left hydroureteronephrosis seen previously is mildly progressive in the interval, again with dilatation down to the low right pelvis as the ureter passes  by the cervix just proximal to the UVJ. SKELETON: Hypermetabolic right ischial lesion seen previously remains hypermetabolic today with SUV max = 4.3 today compared to 9.5 previously Incidental CT findings: none IMPRESSION: 1. Enlarging and new bilateral pulmonary nodules are evident today. Larger nodules in the right upper lung show FDG accumulation. Imaging features compatible with metastatic disease. 2. New hypermetabolic lymph nodes identified in the retrocaval space of  the upper abdomen and para-aortic space of the lower abdomen, consistent with new metastatic involvement. There is some persistent hypermetabolism associated with small lymph nodes along the right common iliac chain. 3. Interval decrease in hypermetabolism associated with the cervix. 4. Persistent, mildly progressive left hydroureteronephrosis. Left ureter is dilated down to the level it passes the cervix, just proximal to the UVJ. 5. Decreased hypermetabolism in the previously identified right ischial tuberosity lesion consistent with metastatic disease. 6. New punctate focus of hypermetabolism in the gastric fundus without underlying mass lesion evident by CT. Attention on follow-up recommended. 7. Tiny gas bubble in the bladder lumen is presumably secondary to recent instrumentation although bladder infection could have this appearance. Electronically Signed   By: Misty Stanley M.D.   On: 07/27/2020 12:55    Impression: Stage IVB (cT3b, cN1a, cM1) adenocarcinoma of the cervix  The patient is recovering from the effects of radiation.  ***  Plan: The patient is scheduled to follow up with Dr. Alvy Bimler on 08/24/2020 and continue chemotherapy. She will follow up with radiation oncology in *** months.  Total time spent in this encounter was *** minutes which included reviewing the patient's most recent PET scan, follow-ups, chemotherapy, physical examination, and documentation. ____________________________________   Blair Promise, PhD, MD  This document serves as a record of services personally performed by Gery Pray, MD. It was created on his behalf by Clerance Lav, a trained medical scribe. The creation of this record is based on the scribe's personal observations and the provider's statements to them. This document has been checked and approved by the attending provider.

## 2020-08-24 ENCOUNTER — Inpatient Hospital Stay (HOSPITAL_BASED_OUTPATIENT_CLINIC_OR_DEPARTMENT_OTHER): Payer: Managed Care, Other (non HMO) | Admitting: Hematology and Oncology

## 2020-08-24 ENCOUNTER — Other Ambulatory Visit: Payer: Self-pay

## 2020-08-24 ENCOUNTER — Other Ambulatory Visit: Payer: Self-pay | Admitting: Hematology and Oncology

## 2020-08-24 ENCOUNTER — Encounter: Payer: Self-pay | Admitting: Hematology and Oncology

## 2020-08-24 ENCOUNTER — Inpatient Hospital Stay: Payer: Managed Care, Other (non HMO)

## 2020-08-24 VITALS — BP 126/86 | HR 96 | Temp 99.4°F | Resp 20 | Ht 65.0 in | Wt 210.8 lb

## 2020-08-24 VITALS — BP 133/72 | HR 82 | Resp 18

## 2020-08-24 DIAGNOSIS — C7951 Secondary malignant neoplasm of bone: Secondary | ICD-10-CM

## 2020-08-24 DIAGNOSIS — D61818 Other pancytopenia: Secondary | ICD-10-CM

## 2020-08-24 DIAGNOSIS — Z5111 Encounter for antineoplastic chemotherapy: Secondary | ICD-10-CM | POA: Diagnosis not present

## 2020-08-24 DIAGNOSIS — C539 Malignant neoplasm of cervix uteri, unspecified: Secondary | ICD-10-CM

## 2020-08-24 DIAGNOSIS — N1330 Unspecified hydronephrosis: Secondary | ICD-10-CM | POA: Diagnosis not present

## 2020-08-24 DIAGNOSIS — I10 Essential (primary) hypertension: Secondary | ICD-10-CM

## 2020-08-24 DIAGNOSIS — E039 Hypothyroidism, unspecified: Secondary | ICD-10-CM

## 2020-08-24 LAB — CMP (CANCER CENTER ONLY)
ALT: 16 U/L (ref 0–44)
AST: 13 U/L — ABNORMAL LOW (ref 15–41)
Albumin: 3.9 g/dL (ref 3.5–5.0)
Alkaline Phosphatase: 46 U/L (ref 38–126)
Anion gap: 11 (ref 5–15)
BUN: 27 mg/dL — ABNORMAL HIGH (ref 8–23)
CO2: 27 mmol/L (ref 22–32)
Calcium: 10.5 mg/dL — ABNORMAL HIGH (ref 8.9–10.3)
Chloride: 100 mmol/L (ref 98–111)
Creatinine: 1.4 mg/dL — ABNORMAL HIGH (ref 0.44–1.00)
GFR, Estimated: 42 mL/min — ABNORMAL LOW (ref >60)
Glucose, Bld: 137 mg/dL — ABNORMAL HIGH (ref 70–99)
Potassium: 3.9 mmol/L (ref 3.5–5.1)
Sodium: 138 mmol/L (ref 135–145)
Total Bilirubin: 0.5 mg/dL (ref 0.3–1.2)
Total Protein: 7.5 g/dL (ref 6.5–8.1)

## 2020-08-24 LAB — CBC WITH DIFFERENTIAL (CANCER CENTER ONLY)
Abs Immature Granulocytes: 0.01 10*3/uL (ref 0.00–0.07)
Basophils Absolute: 0 10*3/uL (ref 0.0–0.1)
Basophils Relative: 1 %
Eosinophils Absolute: 0 10*3/uL (ref 0.0–0.5)
Eosinophils Relative: 2 %
HCT: 31.1 % — ABNORMAL LOW (ref 36.0–46.0)
Hemoglobin: 10.7 g/dL — ABNORMAL LOW (ref 12.0–15.0)
Immature Granulocytes: 1 %
Lymphocytes Relative: 25 %
Lymphs Abs: 0.4 10*3/uL — ABNORMAL LOW (ref 0.7–4.0)
MCH: 31 pg (ref 26.0–34.0)
MCHC: 34.4 g/dL (ref 30.0–36.0)
MCV: 90.1 fL (ref 80.0–100.0)
Monocytes Absolute: 0.1 10*3/uL (ref 0.1–1.0)
Monocytes Relative: 4 %
Neutro Abs: 1.2 10*3/uL — ABNORMAL LOW (ref 1.7–7.7)
Neutrophils Relative %: 67 %
Platelet Count: 70 10*3/uL — ABNORMAL LOW (ref 150–400)
RBC: 3.45 MIL/uL — ABNORMAL LOW (ref 3.87–5.11)
RDW: 14.5 % (ref 11.5–15.5)
WBC Count: 1.8 10*3/uL — ABNORMAL LOW (ref 4.0–10.5)
nRBC: 0 % (ref 0.0–0.2)

## 2020-08-24 LAB — TOTAL PROTEIN, URINE DIPSTICK: Protein, ur: 30 mg/dL — AB

## 2020-08-24 LAB — MAGNESIUM: Magnesium: 1.2 mg/dL — ABNORMAL LOW (ref 1.7–2.4)

## 2020-08-24 LAB — SAMPLE TO BLOOD BANK

## 2020-08-24 LAB — TSH: TSH: 2.297 u[IU]/mL (ref 0.308–3.960)

## 2020-08-24 LAB — ABO/RH: ABO/RH(D): B NEG

## 2020-08-24 MED ORDER — SODIUM CHLORIDE 0.9% FLUSH
10.0000 mL | Freq: Once | INTRAVENOUS | Status: DC | PRN
  Filled 2020-08-24: qty 10

## 2020-08-24 MED ORDER — SODIUM CHLORIDE 0.9 % IV SOLN
Freq: Once | INTRAVENOUS | Status: AC
  Filled 2020-08-24: qty 250

## 2020-08-24 MED ORDER — MAGNESIUM SULFATE 4 GM/100ML IV SOLN
4.0000 g | Freq: Once | INTRAVENOUS | Status: AC
  Administered 2020-08-24: 4 g via INTRAVENOUS
  Filled 2020-08-24: qty 100

## 2020-08-24 MED ORDER — SODIUM CHLORIDE 0.9% FLUSH
10.0000 mL | Freq: Once | INTRAVENOUS | Status: AC
  Administered 2020-08-24: 10 mL via INTRAVENOUS
  Filled 2020-08-24: qty 10

## 2020-08-24 MED ORDER — MAGNESIUM OXIDE 400 (241.3 MG) MG PO TABS: 400.0000 mg | ORAL_TABLET | Freq: Every day | ORAL | 1 refills | Status: DC

## 2020-08-24 MED ORDER — HEPARIN SOD (PORK) LOCK FLUSH 100 UNIT/ML IV SOLN
500.0000 [IU] | Freq: Once | INTRAVENOUS | Status: AC
  Administered 2020-08-24: 500 [IU] via INTRAVENOUS
  Filled 2020-08-24: qty 5

## 2020-08-24 MED ORDER — HEPARIN SOD (PORK) LOCK FLUSH 100 UNIT/ML IV SOLN
500.0000 [IU] | Freq: Once | INTRAVENOUS | Status: DC | PRN
  Filled 2020-08-24: qty 5

## 2020-08-24 NOTE — Assessment & Plan Note (Signed)
She has slight acute on chronic renal failure I recommend holding her hydrochlorothiazide for 3 days I recommend IV fluid support and increase oral intake as tolerated She is in agreement

## 2020-08-24 NOTE — Assessment & Plan Note (Signed)
This is likely due to poor oral intake I recommend IV magnesium infusion along with oral magnesium supplement

## 2020-08-24 NOTE — Assessment & Plan Note (Signed)
This is due to recent treatment She does not need transfusion support Observe closely for now She has no clinical signs of bleeding

## 2020-08-24 NOTE — Assessment & Plan Note (Addendum)
Her blood pressure control for the last few days were satisfactory Observe closely for now

## 2020-08-24 NOTE — Patient Instructions (Signed)
Dehydration, Adult Dehydration is a condition in which there is not enough water or other fluids in the body. This happens when a person loses more fluids than he or she takes in. Important organs, such as the kidneys, brain, and heart, cannot function without a proper amount of fluids. Any loss of fluids from the body can lead to dehydration. Dehydration can be mild, moderate, or severe. It should be treated right away to prevent it from becoming severe. What are the causes? Dehydration may be caused by:  Conditions that cause loss of water or other fluids, such as diarrhea, vomiting, or sweating or urinating a lot.  Not drinking enough fluids, especially when you are ill or doing activities that require a lot of energy.  Other illnesses and conditions, such as fever or infection.  Certain medicines, such as medicines that remove excess fluid from the body (diuretics).  Lack of safe drinking water.  Not being able to get enough water and food. What increases the risk? The following factors may make you more likely to develop this condition:  Having a long-term (chronic) illness that has not been treated properly, such as diabetes, heart disease, or kidney disease.  Being 65 years of age or older.  Having a disability.  Living in a place that is high in altitude, where thinner, drier air causes more fluid loss.  Doing exercises that put stress on your body for a long time (endurance sports). What are the signs or symptoms? Symptoms of dehydration depend on how severe it is. Mild or moderate dehydration  Thirst.  Dry lips or dry mouth.  Dizziness or light-headedness, especially when standing up from a seated position.  Muscle cramps.  Dark urine. Urine may be the color of tea.  Less urine or tears produced than usual.  Headache. Severe dehydration  Changes in skin. Your skin may be cold and clammy, blotchy, or pale. Your skin also may not return to normal after being  lightly pinched and released.  Little or no tears, urine, or sweat.  Changes in vital signs, such as rapid breathing and low blood pressure. Your pulse may be weak or may be faster than 100 beats a minute when you are sitting still.  Other changes, such as: ? Feeling very thirsty. ? Sunken eyes. ? Cold hands and feet. ? Confusion. ? Being very tired (lethargic) or having trouble waking from sleep. ? Short-term weight loss. ? Loss of consciousness. How is this diagnosed? This condition is diagnosed based on your symptoms and a physical exam. You may have blood and urine tests to help confirm the diagnosis. How is this treated? Treatment for this condition depends on how severe it is. Treatment should be started right away. Do not wait until dehydration becomes severe. Severe dehydration is an emergency and needs to be treated in a hospital.  Mild or moderate dehydration can be treated at home. You may be asked to: ? Drink more fluids. ? Drink an oral rehydration solution (ORS). This drink helps restore proper amounts of fluids and salts and minerals in the blood (electrolytes).  Severe dehydration can be treated: ? With IV fluids. ? By correcting abnormal levels of electrolytes. This is often done by giving electrolytes through a tube that is passed through your nose and into your stomach (nasogastric tube, or NG tube). ? By treating the underlying cause of dehydration. Follow these instructions at home: Oral rehydration solution If told by your health care provider, drink an ORS:  Make   an ORS by following instructions on the package.  Start by drinking small amounts, about  cup (120 mL) every 5-10 minutes.  Slowly increase how much you drink until you have taken the amount recommended by your health care provider. Eating and drinking  Drink enough clear fluid to keep your urine pale yellow. If you were told to drink an ORS, finish the ORS first and then start slowly drinking  other clear fluids. Drink fluids such as: ? Water. Do not drink only water. Doing that can lead to hyponatremia, which is having too little salt (sodium) in the body. ? Water from ice chips you suck on. ? Fruit juice that you have added water to (diluted fruit juice). ? Low-calorie sports drinks.  Eat foods that contain a healthy balance of electrolytes, such as bananas, oranges, potatoes, tomatoes, and spinach.  Do not drink alcohol.  Avoid the following: ? Drinks that contain a lot of sugar. These include high-calorie sports drinks, fruit juice that is not diluted, and soda. ? Caffeine. ? Foods that are greasy or contain a lot of fat or sugar.         General instructions  Take over-the-counter and prescription medicines only as told by your health care provider.  Do not take sodium tablets. Doing that can lead to having too much sodium in the body (hypernatremia).  Return to your normal activities as told by your health care provider. Ask your health care provider what activities are safe for you.  Keep all follow-up visits as told by your health care provider. This is important. Contact a health care provider if:  You have muscle cramps, pain, or discomfort, such as: ? Pain in your abdomen and the pain gets worse or stays in one area (localizes). ? Stiff neck.  You have a rash.  You are more irritable than usual.  You are sleepier or have a harder time waking than usual.  You feel weak or dizzy.  You feel very thirsty. Get help right away if you have:  Any symptoms of severe dehydration.  Symptoms of vomiting, such as: ? You cannot eat or drink without vomiting. ? Vomiting gets worse or does not go away. ? Vomit includes blood or green matter (bile).  Symptoms that get worse with treatment.  A fever.  A severe headache.  Problems with urination or bowel movements, such as: ? Diarrhea that gets worse or does not go away. ? Blood in your stool (feces).  This may cause stool to look black and tarry. ? Not urinating, or urinating only a small amount of very dark urine, within 6-8 hours.  Trouble breathing. These symptoms may represent a serious problem that is an emergency. Do not wait to see if the symptoms will go away. Get medical help right away. Call your local emergency services (911 in the [place] Do not drive yourself to the hospital. Summary  Dehydration is a condition in which there is not enough water or other fluids in the body. This happens when a person loses more fluids than he or she takes in.  Treatment for this condition depends on how severe it is. Treatment should be started right away. Do not wait until dehydration becomes severe.  Drink enough clear fluid to keep your urine pale yellow. If you were told to drink an oral rehydration solution (ORS), finish the ORS first and then start slowly drinking other clear fluids.  Take over-the-counter and prescription medicines only as told by your health   care provider.  Get help right away if you have any symptoms of severe dehydration. This information is not intended to replace advice given to you by your health care provider. Make sure you discuss any questions you have with your health care provider. Document Revised: 01/16/2019 Document Reviewed: 01/16/2019 Elsevier Patient Education  2021 [employer]  

## 2020-08-24 NOTE — Assessment & Plan Note (Signed)
The cause of her acute change in performance status, weakness and fatigue is unknown Her blood work show some mild anemia but not worse She has some minor electrolyte imbalance with hypomagnesemia and slight elevated serum creatinine secondary to dehydration but nothing would have explain her symptoms It could be related to side effects of chemotherapy for sure TSH was unremarkable I recommend IV fluid support and IV magnesium today We will call the patient for symptom management tomorrow

## 2020-08-24 NOTE — Assessment & Plan Note (Signed)
TSH is stable Observe for now

## 2020-08-24 NOTE — Progress Notes (Signed)
[place] OFFICE PROGRESS NOTE  Patient Care Team: [name], MD as PCP - General (Family Medicine) [name], MD as Consulting Physician (Obstetrics and Gynecology) [name], RN as Oncology Nurse Navigator (Oncology)  ASSESSMENT & PLAN:  Cervical cancer, FIGO stage IVB [facility]) The cause of her acute change in performance status, weakness and fatigue is unknown Her blood work show some mild anemia but not worse She has some minor electrolyte imbalance with hypomagnesemia and slight elevated serum creatinine secondary to dehydration but nothing would have explain her symptoms It could be related to side effects of chemotherapy for sure TSH was unremarkable I recommend IV fluid support and IV magnesium today We will call the patient for symptom management tomorrow  Hypomagnesemia This is likely due to poor oral intake I recommend IV magnesium infusion along with oral magnesium supplement  Pancytopenia, acquired ([place]) This is due to recent treatment She does not need transfusion support Observe closely for now She has no clinical signs of bleeding  Hydronephrosis of left kidney She has slight acute on chronic renal failure I recommend holding her hydrochlorothiazide for 3 days I recommend IV fluid support and increase oral intake as tolerated She is in agreement  Essential hypertension Her blood pressure control for the last few days were satisfactory Observe closely for now  Acquired hypothyroidism TSH is stable Observe for now   No orders of the defined types were placed in this encounter.   All questions were answered. The patient knows to call the clinic with any problems, questions or concerns. The total time spent in the appointment was 40 minutes encounter with patients including review of chart and various tests results, discussions about plan of care and coordination of care plan   [name], MD 08/24/2020 1:44 PM  INTERVAL  HISTORY: Please see below for problem oriented charting. This morning, we have set up a virtual visit to document blood pressure control at home.  However, when I called the patient this morning, she did not feel well She was subsequently brought in for evaluation including blood draw She complain of acute onset of excessive fatigue and generalized weakness Last week, she had 1 day of nausea but resolved with antiemetics She denies constipation Denies flare of her bone pain She has lost a lot of weight She is struggled with oral intake No recent fever or chills The patient denies any recent signs or symptoms of bleeding such as spontaneous epistaxis, hematuria or hematochezia.  SUMMARY OF ONCOLOGIC HISTORY: Oncology History Overview Note  PD-L1 CPS: 2   Cervical cancer, FIGO stage IVB ([place])  03/16/2020 Initial Diagnosis   The patient had history of new onset vaginal spotting on March 16, 2020.  She immediately called her OB/GYN's office and achieved an appointment with her OB/GYN's partner, Dr. Murrell Redden, who saw and evaluated the patient on March 19, 2020.  At that time a transvaginal ultrasound scan was performed which revealed a uterus measuring 5.3 x 8.4 x 4.6 cm with an ill-defined endometrium.  There is a complex cyst on the left ovary measuring 2.6 cm with peripheral blood flow noted.  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  Immunostains were positive for CEA and CDX2, negative for p16, ER, vimentin, and PAX8.  The differential diagnosis included primary gastric type endocervical adenocarcinoma (though this typically stains positive for PAX8) or a metastasis from pancreaticobiliary or upper  GI primary.   03/29/2020 Pathology Results   1. Surgical [P], duodenum - PEPTIC DUODENITIS. - NO DYSPLASIA OR MALIGNANCY. 2. Surgical [P], gastric antrum and gastric body -  REACTIVE GASTROPATHY. [place] IS NEGATIVE FOR HELICOBACTER PYLORI. - NO INTESTINAL METAPLASIA, DYSPLASIA, OR MALIGNANCY. 3. Surgical [P], gastric nodule - MILD REACTIVE GASTROPATHY. - NO INTESTINAL METAPLASIA, DYSPLASIA, OR MALIGNANCY. 4. Surgical [P], stomach, lesser curve ulcers - REACTIVE GASTROPATHY WITH EROSIONS. - NO INTESTINAL METAPLASIA, DYSPLASIA, OR MALIGNANCY. 5. Surgical [P], gastric polyps - HYPERPLASTIC POLYP. - NO INTESTINAL METAPLASIA, DYSPLASIA OR MALIGNANCY. 6. Surgical [P], colon, descending, polyp - HYPERPLASTIC POLYP. - NO DYSPLASIA OR MALIGNANCY.   04/06/2020 Procedure   EGD report  Normal esophagus. - A few gastric polyps. Suspected fundic gland polyps. - Non-bleeding gastric ulcers with pigmented material. Biopsied. - A single gastric polyp/nodule. Resected and retrieved. - Erythematous duodenopathy. Biopsied. - The examination was otherwise normal.   04/06/2020 Procedure   Colonoscopy report - Non-bleeding internal hemorrhoids. - One 4 mm polyp in the descending colon, removed with a cold snare. Resected and retrieved. - The examination was otherwise normal on direct and retroflexion views   04/19/2020 PET scan   1. Hypermetabolic uterine cervix mass compatible with known primary cervical malignancy, with hypermetabolism extending into the upper third of the vagina and extending throughout the uterine body. No frank extrauterine extension. 2. Mild to moderate left hydroureteronephrosis to the level of the left UVJ, concerning for tumor involvement of the left UVJ. 3. Hypermetabolic right common iliac nodal metastasis. 4. Hypermetabolic sclerotic right ischial bone metastasis. 5. Two indeterminate small scattered solid pulmonary nodules, below PET resolution, warranting attention on chest CT follow-up in 3 months. 6.  Aortic Atherosclerosis (ICD10-I70.0).   04/19/2020 Cancer Staging   Staging form: Cervix Uteri, AJCC Version 9 - Clinical stage  from 04/19/2020: FIGO Stage IVB (cT3b, cN1a, cM1) - Signed by [name], MD on 04/19/2020   04/26/2020 Procedure   Successful placement of a right IJ approach Power Port with ultrasound and fluoroscopic guidance. The catheter is ready for use   04/30/2020 -  Chemotherapy   The patient had cisplatin for chemotherapy treatment.     06/20/2020 Genetic Testing   Negative genetic testing on the CancerNext-Expanded+RNAinsight panel.  The CancerNext-Expanded gene panel offered by [facility] and includes sequencing and rearrangement analysis for the following 77 genes: AIP, ALK, APC*, ATM*, AXIN2, BAP1, BARD1, BLM, BMPR1A, BRCA1*, BRCA2*, BRIP1*, CDC73, CDH1*, CDK4, CDKN1B, CDKN2A, CHEK2*, CTNNA1, DICER1, FANCC, FH, FLCN, GALNT12, KIF1B, LZTR1, MAX, MEN1, MET, MLH1*, MSH2*, MSH3, MSH6*, MUTYH*, NBN, NF1*, NF2, NTHL1, PALB2*, PHOX2B, PMS2*, POT1, PRKAR1A, PTCH1, PTEN*, RAD51C*, RAD51D*, RB1, RECQL, RET, SDHA, SDHAF2, SDHB, SDHC, SDHD, SMAD4, SMARCA4, SMARCB1, SMARCE1, STK11, SUFU, TMEM127, TP53*, TSC1, TSC2, VHL and XRCC2 (sequencing and deletion/duplication); EGFR, EGLN1, HOXB13, KIT, MITF, PDGFRA, POLD1, and POLE (sequencing only); EPCAM and GREM1 (deletion/duplication only). DNA and RNA analyses performed for * genes. The report date is June 20, 2020.   07/28/2020 -  Chemotherapy    Patient is on Treatment Plan: CERVICAL PEMBROLIZUMAB/CARBOPLATIN + PACLITAXEL + BEVACIZUMAB Q21D       Metastasis to bone ([place])  04/19/2020 Initial Diagnosis   Metastasis to bone ([place])   07/28/2020 -  Chemotherapy    Patient is on Treatment Plan: CERVICAL PEMBROLIZUMAB/CARBOPLATIN + PACLITAXEL + BEVACIZUMAB Q21D         REVIEW OF SYSTEMS:   Constitutional: Denies fevers, chills  Eyes: Denies blurriness of vision Ears, nose, mouth, throat, and face: Denies  mucositis or sore throat Respiratory: Denies cough, dyspnea or wheezes Cardiovascular: Denies palpitation, chest discomfort or lower extremity swelling Skin:  Denies abnormal skin rashes Lymphatics: Denies new lymphadenopathy or easy bruising Behavioral/Psych: Mood is stable, no new changes  All other systems were reviewed with the patient and are negative.  I have reviewed the past medical history, past surgical history, social history and family history with the patient and they are unchanged from previous note.  ALLERGIES:  is allergic to penicillins.  MEDICATIONS:  Current Outpatient Medications  Medication Sig Dispense Refill  . calcium carbonate (TUMS - DOSED IN MG ELEMENTAL CALCIUM) 500 MG chewable tablet Chew 1 tablet by mouth 2 (two) times daily.    . Cholecalciferol (VITAMIN D3) 2000 units TABS Take 2 tablets by mouth daily.    . clobetasol cream (TEMOVATE) 0.05 % Apply topically as needed. Left leg (Patient not taking: Reported on 08/19/2020)    . dexamethasone (DECADRON) 4 MG tablet Take 4 mg by mouth as directed.    . fenofibrate 160 MG tablet TAKE 1 TABLET BY MOUTH EVERY DAY (Patient taking differently: Take 160 mg by mouth daily with lunch.) 30 tablet 5  . hydrochlorothiazide (HYDRODIURIL) 25 MG tablet Take 1 tablet (25 mg total) by mouth daily. 30 tablet 9  . lidocaine-prilocaine (EMLA) cream Apply to affected area once (Patient not taking: Reported on 08/19/2020) 30 g 3  . LORazepam (ATIVAN) 0.5 MG tablet Take 1 tablet (0.5 mg total) by mouth every 8 (eight) hours as needed for anxiety. 30 tablet 0  . magnesium oxide (MAG-OX) 400 (241.3 Mg) MG tablet Take 1 tablet (400 mg total) by mouth daily. 30 tablet 1  . ondansetron (ZOFRAN) 8 MG tablet Take 1 tablet (8 mg total) by mouth every 8 (eight) hours as needed. Start on the third day after cisplatin chemotherapy. (Patient not taking: Reported on 08/19/2020) 30 tablet 1  . pantoprazole (PROTONIX) 40 MG tablet Take 1 tablet (40 mg total) by mouth 2 (two) times daily. 90 tablet 3  . prochlorperazine (COMPAZINE) 10 MG tablet Take 1 tablet (10 mg total) by mouth every 6 (six) hours as needed  (Nausea or vomiting). (Patient not taking: Reported on 08/19/2020) 30 tablet 1  . traMADol (ULTRAM) 50 MG tablet Take 2 tablets (100 mg total) by mouth every 6 (six) hours as needed. 60 tablet 0   Current Facility-Administered Medications  Medication Dose Route Frequency Provider Last Rate Last Admin  . heparin lock flush 100 unit/mL  500 Units Intracatheter Once PRN Alvy Bimler, Lacora Folmer, MD      . magnesium sulfate IVPB 4 g 100 mL  4 g Intravenous Once Alvy Bimler, Hykeem Ojeda, MD 50 mL/hr at 08/24/20 1251 4 g at 08/24/20 1251  . sodium chloride flush (NS) 0.9 % injection 10 mL  10 mL Intracatheter Once PRN [name], MD        PHYSICAL EXAMINATION: ECOG PERFORMANCE STATUS: 2 - Symptomatic, <50% confined to bed  Vitals:   08/24/20 1135  BP: 126/86  Pulse: 96  Resp: 20  Temp: 99.4 F (37.4 C)  SpO2: 100%   Filed Weights   08/24/20 1135  Weight: 210 lb 12.8 oz (95.6 kg)    GENERAL:alert, but she looks pale and debilitated SKIN: skin color, texture, turgor are normal, no rashes or significant lesions EYES: normal, Conjunctiva are pink and non-injected, sclera clear OROPHARYNX:no exudate, no erythema and lips, buccal mucosa, and tongue normal  NECK: supple, thyroid normal size, non-tender, without nodularity LYMPH:  no  palpable lymphadenopathy in the cervical, axillary or inguinal LUNGS: clear to auscultation and percussion with normal breathing effort HEART: regular rate & rhythm and no murmurs and no lower extremity edema ABDOMEN:abdomen soft, non-tender and normal bowel sounds Musculoskeletal:no cyanosis of digits and no clubbing  NEURO: alert & oriented x 3 with fluent speech, no focal motor/sensory deficits  LABORATORY DATA:  I have reviewed the data as listed    Component Value Date/Time   NA 138 08/24/2020 1100   NA 140 06/21/2016 1530   K 3.9 08/24/2020 1100   CL 100 08/24/2020 1100   CO2 27 08/24/2020 1100   GLUCOSE 137 (H) 08/24/2020 1100   BUN 27 (H) 08/24/2020 1100   BUN 16  06/21/2016 1530   CREATININE 1.40 (H) 08/24/2020 1100   CREATININE 0.87 07/02/2017 1549   CALCIUM 10.5 (H) 08/24/2020 1100   PROT 7.5 08/24/2020 1100   PROT 7.3 06/21/2016 1530   ALBUMIN 3.9 08/24/2020 1100   ALBUMIN 4.3 06/21/2016 1530   AST 13 (L) 08/24/2020 1100   ALT 16 08/24/2020 1100   ALKPHOS 46 08/24/2020 1100   BILITOT 0.5 08/24/2020 1100   GFRNONAA 42 (L) 08/24/2020 1100   GFRAA 111 06/21/2016 1530    No results found for: SPEP, UPEP  Lab Results  Component Value Date   WBC 1.8 (L) 08/24/2020   NEUTROABS 1.2 (L) 08/24/2020   HGB 10.7 (L) 08/24/2020   HCT 31.1 (L) 08/24/2020   MCV 90.1 08/24/2020   PLT 70 (L) 08/24/2020      Chemistry      Component Value Date/Time   NA 138 08/24/2020 1100   NA 140 06/21/2016 1530   K 3.9 08/24/2020 1100   CL 100 08/24/2020 1100   CO2 27 08/24/2020 1100   BUN 27 (H) 08/24/2020 1100   BUN 16 06/21/2016 1530   CREATININE 1.40 (H) 08/24/2020 1100   CREATININE 0.87 07/02/2017 1549      Component Value Date/Time   CALCIUM 10.5 (H) 08/24/2020 1100   ALKPHOS 46 08/24/2020 1100   AST 13 (L) 08/24/2020 1100   ALT 16 08/24/2020 1100   BILITOT 0.5 08/24/2020 1100

## 2020-08-27 ENCOUNTER — Telehealth: Payer: Self-pay

## 2020-08-27 NOTE — Telephone Encounter (Signed)
Called and given below message. She verbalized understanding. She feels so much better. She is eating and drinking with no problems. 3/9 am bp 137/86, pulse 98 and pm 133/70, pulse 91 3/10 am bp146/91, pulse 86 and pm 148/82, pulse 82 She has not checked bp today. She will resume HCTZ today. Instructed to call the office if needed. She verbalized understanding.

## 2020-08-27 NOTE — Telephone Encounter (Signed)
-----   Message from [name], MD sent at 08/27/2020  7:52 AM EST ----- Regarding: how is she doing Is she better? Able to eat better? Has she checked her BP? If better she can resume HCTZ

## 2020-09-07 ENCOUNTER — Encounter: Payer: Self-pay | Admitting: Hematology and Oncology

## 2020-09-07 ENCOUNTER — Other Ambulatory Visit: Payer: Self-pay

## 2020-09-07 ENCOUNTER — Inpatient Hospital Stay: Payer: Managed Care, Other (non HMO)

## 2020-09-07 ENCOUNTER — Telehealth: Payer: Self-pay

## 2020-09-07 ENCOUNTER — Inpatient Hospital Stay (HOSPITAL_BASED_OUTPATIENT_CLINIC_OR_DEPARTMENT_OTHER): Payer: Managed Care, Other (non HMO) | Admitting: Hematology and Oncology

## 2020-09-07 VITALS — BP 133/78 | HR 97

## 2020-09-07 VITALS — BP 154/86 | HR 101 | Temp 97.6°F | Resp 18 | Ht 65.0 in | Wt 210.0 lb

## 2020-09-07 DIAGNOSIS — I129 Hypertensive chronic kidney disease with stage 1 through stage 4 chronic kidney disease, or unspecified chronic kidney disease: Secondary | ICD-10-CM

## 2020-09-07 DIAGNOSIS — N1330 Unspecified hydronephrosis: Secondary | ICD-10-CM

## 2020-09-07 DIAGNOSIS — D61818 Other pancytopenia: Secondary | ICD-10-CM

## 2020-09-07 DIAGNOSIS — C7951 Secondary malignant neoplasm of bone: Secondary | ICD-10-CM

## 2020-09-07 DIAGNOSIS — N189 Chronic kidney disease, unspecified: Secondary | ICD-10-CM

## 2020-09-07 DIAGNOSIS — Z5111 Encounter for antineoplastic chemotherapy: Secondary | ICD-10-CM | POA: Diagnosis not present

## 2020-09-07 DIAGNOSIS — C539 Malignant neoplasm of cervix uteri, unspecified: Secondary | ICD-10-CM

## 2020-09-07 DIAGNOSIS — R739 Hyperglycemia, unspecified: Secondary | ICD-10-CM

## 2020-09-07 DIAGNOSIS — I10 Essential (primary) hypertension: Secondary | ICD-10-CM

## 2020-09-07 DIAGNOSIS — T50905A Adverse effect of unspecified drugs, medicaments and biological substances, initial encounter: Secondary | ICD-10-CM | POA: Insufficient documentation

## 2020-09-07 LAB — CBC WITH DIFFERENTIAL (CANCER CENTER ONLY)
Abs Immature Granulocytes: 0.06 10*3/uL (ref 0.00–0.07)
Basophils Absolute: 0 10*3/uL (ref 0.0–0.1)
Basophils Relative: 0 %
Eosinophils Absolute: 0 10*3/uL (ref 0.0–0.5)
Eosinophils Relative: 0 %
HCT: 29 % — ABNORMAL LOW (ref 36.0–46.0)
Hemoglobin: 9.8 g/dL — ABNORMAL LOW (ref 12.0–15.0)
Immature Granulocytes: 1 %
Lymphocytes Relative: 8 %
Lymphs Abs: 0.6 10*3/uL — ABNORMAL LOW (ref 0.7–4.0)
MCH: 30.4 pg (ref 26.0–34.0)
MCHC: 33.8 g/dL (ref 30.0–36.0)
MCV: 90.1 fL (ref 80.0–100.0)
Monocytes Absolute: 0.1 10*3/uL (ref 0.1–1.0)
Monocytes Relative: 2 %
Neutro Abs: 5.9 10*3/uL (ref 1.7–7.7)
Neutrophils Relative %: 89 %
Platelet Count: 137 10*3/uL — ABNORMAL LOW (ref 150–400)
RBC: 3.22 MIL/uL — ABNORMAL LOW (ref 3.87–5.11)
RDW: 15.3 % (ref 11.5–15.5)
WBC Count: 6.6 10*3/uL (ref 4.0–10.5)
nRBC: 0 % (ref 0.0–0.2)

## 2020-09-07 LAB — CMP (CANCER CENTER ONLY)
ALT: 17 U/L (ref 0–44)
AST: 15 U/L (ref 15–41)
Albumin: 3.7 g/dL (ref 3.5–5.0)
Alkaline Phosphatase: 68 U/L (ref 38–126)
Anion gap: 14 (ref 5–15)
BUN: 27 mg/dL — ABNORMAL HIGH (ref 8–23)
CO2: 24 mmol/L (ref 22–32)
Calcium: 9.8 mg/dL (ref 8.9–10.3)
Chloride: 98 mmol/L (ref 98–111)
Creatinine: 1.49 mg/dL — ABNORMAL HIGH (ref 0.44–1.00)
GFR, Estimated: 39 mL/min — ABNORMAL LOW (ref >60)
Glucose, Bld: 231 mg/dL — ABNORMAL HIGH (ref 70–99)
Potassium: 3.2 mmol/L — ABNORMAL LOW (ref 3.5–5.1)
Sodium: 136 mmol/L (ref 135–145)
Total Bilirubin: 0.4 mg/dL (ref 0.3–1.2)
Total Protein: 7.7 g/dL (ref 6.5–8.1)

## 2020-09-07 LAB — SAMPLE TO BLOOD BANK

## 2020-09-07 LAB — MAGNESIUM: Magnesium: 1.5 mg/dL — ABNORMAL LOW (ref 1.7–2.4)

## 2020-09-07 MED ORDER — SODIUM CHLORIDE 0.9 % IV SOLN
332.4000 mg | Freq: Once | INTRAVENOUS | Status: AC
  Administered 2020-09-07: 330 mg via INTRAVENOUS
  Filled 2020-09-07: qty 33

## 2020-09-07 MED ORDER — PALONOSETRON HCL INJECTION 0.25 MG/5ML
INTRAVENOUS | Status: AC
  Filled 2020-09-07: qty 5

## 2020-09-07 MED ORDER — DIPHENHYDRAMINE HCL 50 MG/ML IJ SOLN
INTRAMUSCULAR | Status: AC
  Filled 2020-09-07: qty 1

## 2020-09-07 MED ORDER — HEPARIN SOD (PORK) LOCK FLUSH 100 UNIT/ML IV SOLN
500.0000 [IU] | Freq: Once | INTRAVENOUS | Status: AC | PRN
  Administered 2020-09-07: 500 [IU]
  Filled 2020-09-07: qty 5

## 2020-09-07 MED ORDER — DIPHENHYDRAMINE HCL 50 MG/ML IJ SOLN
25.0000 mg | Freq: Once | INTRAMUSCULAR | Status: AC
  Administered 2020-09-07: 25 mg via INTRAVENOUS

## 2020-09-07 MED ORDER — SODIUM CHLORIDE 0.9 % IV SOLN
10.0000 mg | Freq: Once | INTRAVENOUS | Status: AC
  Administered 2020-09-07: 10 mg via INTRAVENOUS
  Filled 2020-09-07: qty 10

## 2020-09-07 MED ORDER — PALONOSETRON HCL INJECTION 0.25 MG/5ML
0.2500 mg | Freq: Once | INTRAVENOUS | Status: AC
  Administered 2020-09-07: 0.25 mg via INTRAVENOUS

## 2020-09-07 MED ORDER — FAMOTIDINE IN NACL 20-0.9 MG/50ML-% IV SOLN
INTRAVENOUS | Status: AC
  Filled 2020-09-07: qty 50

## 2020-09-07 MED ORDER — PACLITAXEL CHEMO INJECTION 300 MG/50ML
140.0000 mg/m2 | Freq: Once | INTRAVENOUS | Status: AC
  Administered 2020-09-07: 294 mg via INTRAVENOUS
  Filled 2020-09-07: qty 49

## 2020-09-07 MED ORDER — SODIUM CHLORIDE 0.9% FLUSH
10.0000 mL | INTRAVENOUS | Status: DC | PRN
  Administered 2020-09-07: 10 mL
  Filled 2020-09-07: qty 10

## 2020-09-07 MED ORDER — LORAZEPAM 2 MG/ML IJ SOLN
0.5000 mg | Freq: Once | INTRAMUSCULAR | Status: AC
  Administered 2020-09-07: 0.5 mg via INTRAVENOUS

## 2020-09-07 MED ORDER — LORAZEPAM 2 MG/ML IJ SOLN
INTRAMUSCULAR | Status: AC
  Filled 2020-09-07: qty 1

## 2020-09-07 MED ORDER — SODIUM CHLORIDE 0.9 % IV SOLN
Freq: Once | INTRAVENOUS | Status: AC
  Filled 2020-09-07: qty 250

## 2020-09-07 MED ORDER — SODIUM CHLORIDE 0.9 % IV SOLN
150.0000 mg | Freq: Once | INTRAVENOUS | Status: AC
  Administered 2020-09-07: 150 mg via INTRAVENOUS
  Filled 2020-09-07: qty 150

## 2020-09-07 MED ORDER — FAMOTIDINE IN NACL 20-0.9 MG/50ML-% IV SOLN
20.0000 mg | Freq: Once | INTRAVENOUS | Status: AC
  Administered 2020-09-07: 20 mg via INTRAVENOUS

## 2020-09-07 MED ORDER — SODIUM CHLORIDE 0.9 % IV SOLN
15.0000 mg/kg | Freq: Once | INTRAVENOUS | Status: AC
  Administered 2020-09-07: 1400 mg via INTRAVENOUS
  Filled 2020-09-07: qty 48

## 2020-09-07 NOTE — Assessment & Plan Note (Signed)
She tolerated last cycle of treatment poorly She has lost weight, and endured significant problems We are in agreement to omit Keytruda from this cycle I will adjust the dose of her chemotherapy based on her recent weight loss and changes in her creatinine I plan to repeat CT imaging in 2 weeks for objective assessment of response to therapy In the meantime, we will continue aggressive supportive care

## 2020-09-07 NOTE — Assessment & Plan Note (Signed)
She has slight elevated creatinine compared to her previous I will reduce the dose of her chemotherapy accordingly

## 2020-09-07 NOTE — Assessment & Plan Note (Signed)
This is due to recent treatment She does not need transfusion support Observe closely for now She has no clinical signs of bleeding

## 2020-09-07 NOTE — Assessment & Plan Note (Signed)
She has significant hypomagnesemia and hypokalemia Continue magnesium replacement

## 2020-09-07 NOTE — Telephone Encounter (Signed)
LM stating that Phyllis Hebert is scheduled for a f/u appointment with Dr. Denman George on Nov 02, 2020 at 1:15 pm with arrival at 12:45 to register.  Enid Derry will call pt with appointment.

## 2020-09-07 NOTE — Assessment & Plan Note (Signed)
This is due to her recent steroid treatment Observe closely for now

## 2020-09-07 NOTE — Patient Instructions (Signed)
   [facility] Discharge Instructions for Patients Receiving Chemotherapy  Today you received the following chemotherapy agents Taxol and Carboplatin   To help prevent nausea and vomiting after your treatment, we encourage you to take your nausea medication as directed.    If you develop nausea and vomiting that is not controlled by your nausea medication, call the clinic.   BELOW ARE SYMPTOMS THAT SHOULD BE REPORTED IMMEDIATELY:  *FEVER GREATER THAN 100.5 F  *CHILLS WITH OR WITHOUT FEVER  NAUSEA AND VOMITING THAT IS NOT CONTROLLED WITH YOUR NAUSEA MEDICATION  *UNUSUAL SHORTNESS OF BREATH  *UNUSUAL BRUISING OR BLEEDING  TENDERNESS IN MOUTH AND THROAT WITH OR WITHOUT PRESENCE OF ULCERS  *URINARY PROBLEMS  *BOWEL PROBLEMS  UNUSUAL RASH Items with * indicate a potential emergency and should be followed up as soon as possible.  Feel free to call the clinic should you have any questions or concerns. The clinic phone number is [phone].  Please show the CHEMO ALERT CARD at check-in to the Emergency Department and triage nurse.   

## 2020-09-07 NOTE — Assessment & Plan Note (Signed)
Her blood pressure is elevated today but her blood pressure monitoring at home is satisfactory We will continue close monitoring

## 2020-09-07 NOTE — Progress Notes (Signed)
[place] OFFICE PROGRESS NOTE  Patient Care Team: [name], MD as PCP - General (Family Medicine) [name], MD as Consulting Physician (Obstetrics and Gynecology) [name], RN as Oncology Nurse Navigator (Oncology)  ASSESSMENT & PLAN:  Cervical cancer, FIGO stage IVB ([place]) She tolerated last cycle of treatment poorly She has lost weight, and endured significant problems We are in agreement to omit Keytruda from this cycle I will adjust the dose of her chemotherapy based on her recent weight loss and changes in her creatinine I plan to repeat CT imaging in 2 weeks for objective assessment of response to therapy In the meantime, we will continue aggressive supportive care  Pancytopenia, acquired [facility]) This is due to recent treatment She does not need transfusion support Observe closely for now She has no clinical signs of bleeding  Hypomagnesemia She has significant hypomagnesemia and hypokalemia Continue magnesium replacement  Hydronephrosis of left kidney She has slight elevated creatinine compared to her previous I will reduce the dose of her chemotherapy accordingly  Essential hypertension Her blood pressure is elevated today but her blood pressure monitoring at home is satisfactory We will continue close monitoring  Drug-induced hyperglycemia This is due to her recent steroid treatment Observe closely for now   Orders Placed This Encounter  Procedures  . CT CHEST ABDOMEN PELVIS W CONTRAST    Standing Status:   Future    Standing Expiration Date:   09/07/2021    Order Specific Question:   Preferred imaging location?    Answer:   [facility]    Order Specific Question:   Radiology Contrast Protocol - do NOT remove file path    Answer:   [email or web address]    All questions were answered. The patient knows to call the clinic with any problems, questions or concerns. The total time  spent in the appointment was 40 minutes encounter with patients including review of chart and various tests results, discussions about plan of care and coordination of care plan   [name], MD 09/07/2020 12:18 PM  INTERVAL HISTORY: Please see below for problem oriented charting. She returns for cycle 3 of treatment She is barely recovering from side effects from cycle 2 She has scant vaginal spotting but no discharge The patient denies any recent signs or symptoms of bleeding such as spontaneous epistaxis, hematuria or hematochezia. She complain of excessive fatigue She denies pain Her bowel habits are stable She denies progression of peripheral neuropathy She is attempting to eat better  SUMMARY OF ONCOLOGIC HISTORY: Oncology History Overview Note  PD-L1 CPS: 2   Cervical cancer, FIGO stage IVB ([place])  03/16/2020 Initial Diagnosis   The patient had history of new onset vaginal spotting on March 16, 2020.  She immediately called her OB/GYN's office and achieved an appointment with her OB/GYN's partner, Dr. Murrell Redden, who saw and evaluated the patient on March 19, 2020.  At that time a transvaginal ultrasound scan was performed which revealed a uterus measuring 5.3 x 8.4 x 4.6 cm with an ill-defined endometrium.  There is a complex cyst on the left ovary measuring 2.6 cm with peripheral blood flow noted.  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  Immunostains were positive for CEA and CDX2, negative for p16, ER, vimentin, and PAX8.  The differential diagnosis included primary gastric type endocervical adenocarcinoma (though this typically stains positive for PAX8) or a metastasis  from pancreaticobiliary or upper GI primary.   03/29/2020 Pathology Results   1. Surgical [P], duodenum - PEPTIC DUODENITIS. - NO DYSPLASIA OR MALIGNANCY. 2. Surgical [P], gastric antrum and  gastric body - REACTIVE GASTROPATHY. [place] IS NEGATIVE FOR HELICOBACTER PYLORI. - NO INTESTINAL METAPLASIA, DYSPLASIA, OR MALIGNANCY. 3. Surgical [P], gastric nodule - MILD REACTIVE GASTROPATHY. - NO INTESTINAL METAPLASIA, DYSPLASIA, OR MALIGNANCY. 4. Surgical [P], stomach, lesser curve ulcers - REACTIVE GASTROPATHY WITH EROSIONS. - NO INTESTINAL METAPLASIA, DYSPLASIA, OR MALIGNANCY. 5. Surgical [P], gastric polyps - HYPERPLASTIC POLYP. - NO INTESTINAL METAPLASIA, DYSPLASIA OR MALIGNANCY. 6. Surgical [P], colon, descending, polyp - HYPERPLASTIC POLYP. - NO DYSPLASIA OR MALIGNANCY.   04/06/2020 Procedure   EGD report  Normal esophagus. - A few gastric polyps. Suspected fundic gland polyps. - Non-bleeding gastric ulcers with pigmented material. Biopsied. - A single gastric polyp/nodule. Resected and retrieved. - Erythematous duodenopathy. Biopsied. - The examination was otherwise normal.   04/06/2020 Procedure   Colonoscopy report - Non-bleeding internal hemorrhoids. - One 4 mm polyp in the descending colon, removed with a cold snare. Resected and retrieved. - The examination was otherwise normal on direct and retroflexion views   04/19/2020 PET scan   1. Hypermetabolic uterine cervix mass compatible with known primary cervical malignancy, with hypermetabolism extending into the upper third of the vagina and extending throughout the uterine body. No frank extrauterine extension. 2. Mild to moderate left hydroureteronephrosis to the level of the left UVJ, concerning for tumor involvement of the left UVJ. 3. Hypermetabolic right common iliac nodal metastasis. 4. Hypermetabolic sclerotic right ischial bone metastasis. 5. Two indeterminate small scattered solid pulmonary nodules, below PET resolution, warranting attention on chest CT follow-up in 3 months. 6.  Aortic Atherosclerosis (ICD10-I70.0).   04/19/2020 Cancer Staging   Staging form: Cervix Uteri, AJCC Version 9 -  Clinical stage from 04/19/2020: FIGO Stage IVB (cT3b, cN1a, cM1) - Signed by [name], MD on 04/19/2020   04/26/2020 Procedure   Successful placement of a right IJ approach Power Port with ultrasound and fluoroscopic guidance. The catheter is ready for use   04/30/2020 -  Chemotherapy   The patient had cisplatin for chemotherapy treatment.     06/20/2020 Genetic Testing   Negative genetic testing on the CancerNext-Expanded+RNAinsight panel.  The CancerNext-Expanded gene panel offered by [facility] and includes sequencing and rearrangement analysis for the following 77 genes: AIP, ALK, APC*, ATM*, AXIN2, BAP1, BARD1, BLM, BMPR1A, BRCA1*, BRCA2*, BRIP1*, CDC73, CDH1*, CDK4, CDKN1B, CDKN2A, CHEK2*, CTNNA1, DICER1, FANCC, FH, FLCN, GALNT12, KIF1B, LZTR1, MAX, MEN1, MET, MLH1*, MSH2*, MSH3, MSH6*, MUTYH*, NBN, NF1*, NF2, NTHL1, PALB2*, PHOX2B, PMS2*, POT1, PRKAR1A, PTCH1, PTEN*, RAD51C*, RAD51D*, RB1, RECQL, RET, SDHA, SDHAF2, SDHB, SDHC, SDHD, SMAD4, SMARCA4, SMARCB1, SMARCE1, STK11, SUFU, TMEM127, TP53*, TSC1, TSC2, VHL and XRCC2 (sequencing and deletion/duplication); EGFR, EGLN1, HOXB13, KIT, MITF, PDGFRA, POLD1, and POLE (sequencing only); EPCAM and GREM1 (deletion/duplication only). DNA and RNA analyses performed for * genes. The report date is June 20, 2020.   07/28/2020 -  Chemotherapy    Patient is on Treatment Plan: CERVICAL PEMBROLIZUMAB/CARBOPLATIN + PACLITAXEL + BEVACIZUMAB Q21D       Metastasis to bone ([place])  04/19/2020 Initial Diagnosis   Metastasis to bone ([place])   07/28/2020 -  Chemotherapy    Patient is on Treatment Plan: CERVICAL PEMBROLIZUMAB/CARBOPLATIN + PACLITAXEL + BEVACIZUMAB Q21D         REVIEW OF SYSTEMS:   Constitutional: Denies fevers, chills  Eyes: Denies blurriness of vision Ears, nose, mouth,  throat, and face: Denies mucositis or sore throat Respiratory: Denies cough, dyspnea or wheezes Cardiovascular: Denies palpitation, chest discomfort or lower extremity  swelling Gastrointestinal:  Denies nausea, heartburn or change in bowel habits Skin: Denies abnormal skin rashes Lymphatics: Denies new lymphadenopathy or easy bruising Neurological:Denies numbness, tingling or new weaknesses Behavioral/Psych: Mood is stable, no new changes  All other systems were reviewed with the patient and are negative.  I have reviewed the past medical history, past surgical history, social history and family history with the patient and they are unchanged from previous note.  ALLERGIES:  is allergic to penicillins.  MEDICATIONS:  Current Outpatient Medications  Medication Sig Dispense Refill  . calcium carbonate (TUMS - DOSED IN MG ELEMENTAL CALCIUM) 500 MG chewable tablet Chew 1 tablet by mouth 2 (two) times daily.    . Cholecalciferol (VITAMIN D3) 2000 units TABS Take 2 tablets by mouth daily.    . clobetasol cream (TEMOVATE) 0.05 % Apply topically as needed. Left leg (Patient not taking: Reported on 08/19/2020)    . dexamethasone (DECADRON) 4 MG tablet Take 4 mg by mouth as directed.    . fenofibrate 160 MG tablet TAKE 1 TABLET BY MOUTH EVERY DAY (Patient taking differently: Take 160 mg by mouth daily with lunch.) 30 tablet 5  . hydrochlorothiazide (HYDRODIURIL) 25 MG tablet Take 1 tablet (25 mg total) by mouth daily. 30 tablet 9  . lidocaine-prilocaine (EMLA) cream Apply to affected area once (Patient not taking: Reported on 08/19/2020) 30 g 3  . LORazepam (ATIVAN) 0.5 MG tablet Take 1 tablet (0.5 mg total) by mouth every 8 (eight) hours as needed for anxiety. 30 tablet 0  . magnesium oxide (MAG-OX) 400 (241.3 Mg) MG tablet Take 1 tablet (400 mg total) by mouth daily. 30 tablet 1  . ondansetron (ZOFRAN) 8 MG tablet Take 1 tablet (8 mg total) by mouth every 8 (eight) hours as needed. Start on the third day after cisplatin chemotherapy. (Patient not taking: Reported on 08/19/2020) 30 tablet 1  . pantoprazole (PROTONIX) 40 MG tablet Take 1 tablet (40 mg total) by mouth 2  (two) times daily. 90 tablet 3  . prochlorperazine (COMPAZINE) 10 MG tablet Take 1 tablet (10 mg total) by mouth every 6 (six) hours as needed (Nausea or vomiting). (Patient not taking: Reported on 08/19/2020) 30 tablet 1  . traMADol (ULTRAM) 50 MG tablet Take 2 tablets (100 mg total) by mouth every 6 (six) hours as needed. 60 tablet 0   No current facility-administered medications for this visit.   Facility-Administered Medications Ordered in Other Visits  Medication Dose Route Frequency Provider Last Rate Last Admin  . bevacizumab-bvzr (ZIRABEV) 1,400 mg in sodium chloride 0.9 % 100 mL chemo infusion  15 mg/kg (Treatment Plan Recorded) Intravenous Once Christinamarie Tall, MD      . CARBOplatin (PARAPLATIN) 330 mg in sodium chloride 0.9 % 250 mL chemo infusion  330 mg Intravenous Once Alvy Bimler, Monroe Qin, MD      . famotidine (PEPCID) IVPB 20 mg premix  20 mg Intravenous Once Alvy Bimler, Khamiya Varin, MD 200 mL/hr at 09/07/20 1209 20 mg at 09/07/20 1209  . heparin lock flush 100 unit/mL  500 Units Intracatheter Once PRN Alvy Bimler, Allianna Beaubien, MD      . PACLitaxel (TAXOL) 294 mg in sodium chloride 0.9 % 250 mL chemo infusion (> 51m/m2)  140 mg/m2 (Treatment Plan Recorded) Intravenous Once Libbey Duce, MD      . sodium chloride flush (NS) 0.9 % injection 10 mL  10  mL Intracatheter PRN [name], MD        PHYSICAL EXAMINATION: ECOG PERFORMANCE STATUS: 1 - Symptomatic but completely ambulatory  Vitals:   09/07/20 1011  BP: (!) 154/86  Pulse: (!) 101  Resp: 18  Temp: 97.6 F (36.4 C)  SpO2: 99%   Filed Weights   09/07/20 1011  Weight: 210 lb (95.3 kg)    GENERAL:alert, no distress and comfortable.  She looks pale SKIN: skin color, texture, turgor are normal, no rashes or significant lesions EYES: normal, Conjunctiva are pink and non-injected, sclera clear OROPHARYNX:no exudate, no erythema and lips, buccal mucosa, and tongue normal  NECK: supple, thyroid normal size, non-tender, without nodularity LYMPH:  no palpable  lymphadenopathy in the cervical, axillary or inguinal LUNGS: clear to auscultation and percussion with normal breathing effort HEART: regular rate & rhythm and no murmurs and no lower extremity edema ABDOMEN:abdomen soft, non-tender and normal bowel sounds Musculoskeletal:no cyanosis of digits and no clubbing  NEURO: alert & oriented x 3 with fluent speech, no focal motor/sensory deficits  LABORATORY DATA:  I have reviewed the data as listed    Component Value Date/Time   NA 136 09/07/2020 0925   NA 140 06/21/2016 1530   K 3.2 (L) 09/07/2020 0925   CL 98 09/07/2020 0925   CO2 24 09/07/2020 0925   GLUCOSE 231 (H) 09/07/2020 0925   BUN 27 (H) 09/07/2020 0925   BUN 16 06/21/2016 1530   CREATININE 1.49 (H) 09/07/2020 0925   CREATININE 0.87 07/02/2017 1549   CALCIUM 9.8 09/07/2020 0925   PROT 7.7 09/07/2020 0925   PROT 7.3 06/21/2016 1530   ALBUMIN 3.7 09/07/2020 0925   ALBUMIN 4.3 06/21/2016 1530   AST 15 09/07/2020 0925   ALT 17 09/07/2020 0925   ALKPHOS 68 09/07/2020 0925   BILITOT 0.4 09/07/2020 0925   GFRNONAA 39 (L) 09/07/2020 0925   GFRAA 111 06/21/2016 1530    No results found for: SPEP, UPEP  Lab Results  Component Value Date   WBC 6.6 09/07/2020   NEUTROABS 5.9 09/07/2020   HGB 9.8 (L) 09/07/2020   HCT 29.0 (L) 09/07/2020   MCV 90.1 09/07/2020   PLT 137 (L) 09/07/2020      Chemistry      Component Value Date/Time   NA 136 09/07/2020 0925   NA 140 06/21/2016 1530   K 3.2 (L) 09/07/2020 0925   CL 98 09/07/2020 0925   CO2 24 09/07/2020 0925   BUN 27 (H) 09/07/2020 0925   BUN 16 06/21/2016 1530   CREATININE 1.49 (H) 09/07/2020 0925   CREATININE 0.87 07/02/2017 1549      Component Value Date/Time   CALCIUM 9.8 09/07/2020 0925   ALKPHOS 68 09/07/2020 0925   AST 15 09/07/2020 0925   ALT 17 09/07/2020 0925   BILITOT 0.4 09/07/2020 0925

## 2020-09-08 ENCOUNTER — Telehealth: Payer: Self-pay | Admitting: *Deleted

## 2020-09-08 NOTE — Telephone Encounter (Signed)
CALLED PATIENT TO INFORM OF FU WITH DR. Denman George ON 11-02-20 - ARRIVAL TIME- 12:45 PM , LVM FOR A RETURN CALL

## 2020-09-17 ENCOUNTER — Telehealth: Payer: Self-pay | Admitting: *Deleted

## 2020-09-17 ENCOUNTER — Telehealth: Payer: Self-pay

## 2020-09-17 NOTE — Telephone Encounter (Signed)
Received a message to call her. Called back. She is requesting to cancel 4/5 appt with Dr. Alvy Bimler. Appt canceled.CT is now scheduled on 4/15 at [place].  FYI

## 2020-09-17 NOTE — Telephone Encounter (Signed)
eived call from patient inquirng about changing her apts according to new CT scan appt.

## 2020-09-20 ENCOUNTER — Telehealth: Payer: Self-pay | Admitting: Hematology and Oncology

## 2020-09-20 ENCOUNTER — Ambulatory Visit (HOSPITAL_COMMUNITY): Payer: Managed Care, Other (non HMO)

## 2020-09-20 NOTE — Telephone Encounter (Signed)
I sent scheduling msg for labs and flush to be added before CT and then see me Monday 4/18 with chemo

## 2020-09-20 NOTE — Telephone Encounter (Signed)
Hi [name],  Can you call her? With her CT moved to 4/15, I will have to cancel her chemo appt next week and move everything But before I do that, can you ask how she is doing? Any problems? Do I need to see her for symptom managemebnt?

## 2020-09-20 NOTE — Telephone Encounter (Signed)
Scheduled per 4/4 sch msg. Called and spoke with pt, confirmed 4/15 and 4/18 appts

## 2020-09-20 NOTE — Telephone Encounter (Signed)
Spoke with patient - Patient reports feeling well.  No concerns reported.  Patient drinking and eating well.     Patient is in agreement with moving appointments to after CT scan.    Would you like them moved a few days after CT scan, with a MD follow up?

## 2020-09-21 ENCOUNTER — Inpatient Hospital Stay: Payer: Managed Care, Other (non HMO) | Admitting: Hematology and Oncology

## 2020-09-23 ENCOUNTER — Telehealth: Payer: Self-pay

## 2020-09-23 NOTE — Telephone Encounter (Signed)
Called and instructed to call back in the am with update on how she is feeling or if something changes today. She verbalized understanding.

## 2020-09-23 NOTE — Telephone Encounter (Signed)
She called and left a message to call her.  Called back. She is feeling a little dehydrated, it started yesterday. She feels better today. She is able to eat some today and able to drink with no problems. Yesterday she started feeling nauseated yesterday and gagging. Denies vomiting. Nausea better today. She took compazine yesterday. She is complaining of shortness of breath with exertion. She has had diarrhea at times and then feels constipated. Last BM this am and was normal. She will start drinking Gatorade today and eat breakfast. She does not feel like she did previously when she came in for IV fluids.

## 2020-09-23 NOTE — Telephone Encounter (Signed)
OK, thanks

## 2020-09-24 ENCOUNTER — Telehealth: Payer: Self-pay

## 2020-09-24 ENCOUNTER — Other Ambulatory Visit (HOSPITAL_COMMUNITY): Payer: Managed Care, Other (non HMO)

## 2020-09-24 NOTE — Telephone Encounter (Signed)
Called and tole her to keep up the work and to call the office if needed.

## 2020-09-24 NOTE — Telephone Encounter (Signed)
She called and left a message to give update from yesterdays call. She is feeling better today. She is able to eat and drink with no problems. She is still having some shortness of breath with walking from room to room. But it is better today.  FYI

## 2020-09-28 ENCOUNTER — Inpatient Hospital Stay: Payer: Managed Care, Other (non HMO)

## 2020-10-01 ENCOUNTER — Other Ambulatory Visit: Payer: Self-pay

## 2020-10-01 ENCOUNTER — Inpatient Hospital Stay: Payer: Managed Care, Other (non HMO) | Attending: Gynecologic Oncology

## 2020-10-01 ENCOUNTER — Ambulatory Visit
Admission: RE | Admit: 2020-10-01 | Discharge: 2020-10-01 | Disposition: A | Discharge status: Home / Self Care | Payer: Managed Care, Other (non HMO) | Source: Ambulatory Visit | Attending: Hematology and Oncology | Admitting: Hematology and Oncology

## 2020-10-01 DIAGNOSIS — M329 Systemic lupus erythematosus, unspecified: Secondary | ICD-10-CM | POA: Diagnosis not present

## 2020-10-01 DIAGNOSIS — N1330 Unspecified hydronephrosis: Secondary | ICD-10-CM | POA: Diagnosis not present

## 2020-10-01 DIAGNOSIS — C7800 Secondary malignant neoplasm of unspecified lung: Secondary | ICD-10-CM | POA: Insufficient documentation

## 2020-10-01 DIAGNOSIS — C539 Malignant neoplasm of cervix uteri, unspecified: Secondary | ICD-10-CM

## 2020-10-01 DIAGNOSIS — R634 Abnormal weight loss: Secondary | ICD-10-CM | POA: Diagnosis not present

## 2020-10-01 DIAGNOSIS — D649 Anemia, unspecified: Secondary | ICD-10-CM | POA: Insufficient documentation

## 2020-10-01 DIAGNOSIS — E876 Hypokalemia: Secondary | ICD-10-CM | POA: Insufficient documentation

## 2020-10-01 DIAGNOSIS — Z79899 Other long term (current) drug therapy: Secondary | ICD-10-CM | POA: Diagnosis not present

## 2020-10-01 DIAGNOSIS — Z9221 Personal history of antineoplastic chemotherapy: Secondary | ICD-10-CM | POA: Insufficient documentation

## 2020-10-01 DIAGNOSIS — C7951 Secondary malignant neoplasm of bone: Secondary | ICD-10-CM

## 2020-10-01 DIAGNOSIS — R630 Anorexia: Secondary | ICD-10-CM | POA: Diagnosis not present

## 2020-10-01 DIAGNOSIS — D61818 Other pancytopenia: Secondary | ICD-10-CM | POA: Diagnosis not present

## 2020-10-01 LAB — CMP (CANCER CENTER ONLY)
ALT: 8 U/L (ref 0–44)
AST: 10 U/L — ABNORMAL LOW (ref 15–41)
Albumin: 3 g/dL — ABNORMAL LOW (ref 3.5–5.0)
Alkaline Phosphatase: 72 U/L (ref 38–126)
Anion gap: 13 (ref 5–15)
BUN: 13 mg/dL (ref 8–23)
CO2: 28 mmol/L (ref 22–32)
Calcium: 9.3 mg/dL (ref 8.9–10.3)
Chloride: 99 mmol/L (ref 98–111)
Creatinine: 1.38 mg/dL — ABNORMAL HIGH (ref 0.44–1.00)
GFR, Estimated: 43 mL/min — ABNORMAL LOW (ref >60)
Glucose, Bld: 125 mg/dL — ABNORMAL HIGH (ref 70–99)
Potassium: 2.8 mmol/L — ABNORMAL LOW (ref 3.5–5.1)
Sodium: 140 mmol/L (ref 135–145)
Total Bilirubin: 0.4 mg/dL (ref 0.3–1.2)
Total Protein: 7.2 g/dL (ref 6.5–8.1)

## 2020-10-01 LAB — TSH: TSH: 1.933 u[IU]/mL (ref 0.308–3.960)

## 2020-10-01 LAB — CBC WITH DIFFERENTIAL (CANCER CENTER ONLY)
Abs Immature Granulocytes: 0.02 10*3/uL (ref 0.00–0.07)
Basophils Absolute: 0 10*3/uL (ref 0.0–0.1)
Basophils Relative: 0 %
Eosinophils Absolute: 0.1 10*3/uL (ref 0.0–0.5)
Eosinophils Relative: 1 %
HCT: 24.7 % — ABNORMAL LOW (ref 36.0–46.0)
Hemoglobin: 8 g/dL — ABNORMAL LOW (ref 12.0–15.0)
Immature Granulocytes: 0 %
Lymphocytes Relative: 9 %
Lymphs Abs: 0.5 10*3/uL — ABNORMAL LOW (ref 0.7–4.0)
MCH: 29.5 pg (ref 26.0–34.0)
MCHC: 32.4 g/dL (ref 30.0–36.0)
MCV: 91.1 fL (ref 80.0–100.0)
Monocytes Absolute: 0.6 10*3/uL (ref 0.1–1.0)
Monocytes Relative: 10 %
Neutro Abs: 4.7 10*3/uL (ref 1.7–7.7)
Neutrophils Relative %: 80 %
Platelet Count: 90 10*3/uL — ABNORMAL LOW (ref 150–400)
RBC: 2.71 MIL/uL — ABNORMAL LOW (ref 3.87–5.11)
RDW: 17.1 % — ABNORMAL HIGH (ref 11.5–15.5)
WBC Count: 5.8 10*3/uL (ref 4.0–10.5)
nRBC: 0 % (ref 0.0–0.2)

## 2020-10-01 LAB — SAMPLE TO BLOOD BANK

## 2020-10-01 LAB — TOTAL PROTEIN, URINE DIPSTICK

## 2020-10-01 LAB — MAGNESIUM: Magnesium: 1.7 mg/dL (ref 1.7–2.4)

## 2020-10-01 MED ORDER — IOPAMIDOL (ISOVUE-300) INJECTION 61%
80.0000 mL | Freq: Once | INTRAVENOUS | Status: AC | PRN
  Administered 2020-10-01: 80 mL via INTRAVENOUS

## 2020-10-01 MED FILL — Dexamethasone Sodium Phosphate Inj 100 MG/10ML: INTRAMUSCULAR | Qty: 1 | Status: AC

## 2020-10-04 ENCOUNTER — Other Ambulatory Visit: Payer: Self-pay | Admitting: Hematology and Oncology

## 2020-10-04 ENCOUNTER — Telehealth: Payer: Self-pay

## 2020-10-04 ENCOUNTER — Inpatient Hospital Stay: Payer: Managed Care, Other (non HMO)

## 2020-10-04 ENCOUNTER — Inpatient Hospital Stay (HOSPITAL_BASED_OUTPATIENT_CLINIC_OR_DEPARTMENT_OTHER): Payer: Managed Care, Other (non HMO) | Admitting: Hematology and Oncology

## 2020-10-04 ENCOUNTER — Telehealth: Payer: Self-pay | Admitting: Hematology and Oncology

## 2020-10-04 ENCOUNTER — Telehealth: Payer: Self-pay | Admitting: Oncology

## 2020-10-04 ENCOUNTER — Other Ambulatory Visit: Payer: Self-pay

## 2020-10-04 ENCOUNTER — Encounter: Payer: Self-pay | Admitting: Hematology and Oncology

## 2020-10-04 VITALS — BP 140/79 | HR 77 | Temp 98.3°F | Resp 18

## 2020-10-04 VITALS — BP 158/84 | HR 105 | Temp 98.1°F | Resp 18 | Ht 65.0 in | Wt 204.1 lb

## 2020-10-04 DIAGNOSIS — E876 Hypokalemia: Secondary | ICD-10-CM | POA: Diagnosis not present

## 2020-10-04 DIAGNOSIS — D539 Nutritional anemia, unspecified: Secondary | ICD-10-CM

## 2020-10-04 DIAGNOSIS — I10 Essential (primary) hypertension: Secondary | ICD-10-CM

## 2020-10-04 DIAGNOSIS — N1330 Unspecified hydronephrosis: Secondary | ICD-10-CM

## 2020-10-04 DIAGNOSIS — C539 Malignant neoplasm of cervix uteri, unspecified: Secondary | ICD-10-CM

## 2020-10-04 DIAGNOSIS — D61818 Other pancytopenia: Secondary | ICD-10-CM

## 2020-10-04 DIAGNOSIS — M329 Systemic lupus erythematosus, unspecified: Secondary | ICD-10-CM

## 2020-10-04 DIAGNOSIS — L932 Other local lupus erythematosus: Secondary | ICD-10-CM | POA: Diagnosis not present

## 2020-10-04 DIAGNOSIS — C7951 Secondary malignant neoplasm of bone: Secondary | ICD-10-CM

## 2020-10-04 LAB — POTASSIUM: Potassium: 2.4 mmol/L — CL (ref 3.5–5.1)

## 2020-10-04 LAB — CBC WITH DIFFERENTIAL/PLATELET
Abs Immature Granulocytes: 0.07 10*3/uL (ref 0.00–0.07)
Basophils Absolute: 0 10*3/uL (ref 0.0–0.1)
Basophils Relative: 0 %
Eosinophils Absolute: 0 10*3/uL (ref 0.0–0.5)
Eosinophils Relative: 0 %
HCT: 25.1 % — ABNORMAL LOW (ref 36.0–46.0)
Hemoglobin: 8.1 g/dL — ABNORMAL LOW (ref 12.0–15.0)
Immature Granulocytes: 1 %
Lymphocytes Relative: 5 %
Lymphs Abs: 0.3 10*3/uL — ABNORMAL LOW (ref 0.7–4.0)
MCH: 29.5 pg (ref 26.0–34.0)
MCHC: 32.3 g/dL (ref 30.0–36.0)
MCV: 91.3 fL (ref 80.0–100.0)
Monocytes Absolute: 0.2 10*3/uL (ref 0.1–1.0)
Monocytes Relative: 3 %
Neutro Abs: 5.7 10*3/uL (ref 1.7–7.7)
Neutrophils Relative %: 91 %
Platelets: 106 10*3/uL — ABNORMAL LOW (ref 150–400)
RBC: 2.75 MIL/uL — ABNORMAL LOW (ref 3.87–5.11)
RDW: 17.3 % — ABNORMAL HIGH (ref 11.5–15.5)
WBC: 6.3 10*3/uL (ref 4.0–10.5)
nRBC: 0 % (ref 0.0–0.2)

## 2020-10-04 LAB — SAMPLE TO BLOOD BANK

## 2020-10-04 LAB — PREPARE RBC (CROSSMATCH)

## 2020-10-04 MED ORDER — DIPHENHYDRAMINE HCL 25 MG PO CAPS
25.0000 mg | ORAL_CAPSULE | Freq: Once | ORAL | Status: AC
  Administered 2020-10-04: 25 mg via ORAL

## 2020-10-04 MED ORDER — SODIUM CHLORIDE 0.9 % IV SOLN
Freq: Once | INTRAVENOUS | Status: AC
  Filled 2020-10-04: qty 250

## 2020-10-04 MED ORDER — ACETAMINOPHEN 325 MG PO TABS
650.0000 mg | ORAL_TABLET | Freq: Once | ORAL | Status: AC
  Administered 2020-10-04: 650 mg via ORAL

## 2020-10-04 MED ORDER — HEPARIN SOD (PORK) LOCK FLUSH 100 UNIT/ML IV SOLN
500.0000 [IU] | Freq: Every day | INTRAVENOUS | Status: AC | PRN
Start: 2020-10-04 — End: 2020-10-04
  Administered 2020-10-04: 500 [IU]
  Filled 2020-10-04: qty 5

## 2020-10-04 MED ORDER — PROCHLORPERAZINE MALEATE 10 MG PO TABS
ORAL_TABLET | Freq: Four times a day (QID) | ORAL | 1 refills | Status: AC | PRN
Start: 2020-10-04 — End: 2021-10-04

## 2020-10-04 MED ORDER — SODIUM CHLORIDE 0.9% FLUSH
10.0000 mL | INTRAVENOUS | Status: AC | PRN
Start: 2020-10-04 — End: 2020-10-04
  Administered 2020-10-04: 10 mL
  Filled 2020-10-04: qty 10

## 2020-10-04 MED ORDER — SODIUM CHLORIDE 0.9% IV SOLUTION
250.0000 mL | Freq: Once | INTRAVENOUS | Status: AC
  Administered 2020-10-04: 250 mL via INTRAVENOUS
  Filled 2020-10-04: qty 250

## 2020-10-04 MED ORDER — PREDNISONE 20 MG PO TABS: 40.0000 mg | ORAL_TABLET | Freq: Every day | ORAL | 1 refills | Status: AC

## 2020-10-04 MED ORDER — POTASSIUM CHLORIDE 10 MEQ/100ML IV SOLN
10.0000 meq | INTRAVENOUS | Status: AC
  Administered 2020-10-04 (×4): 10 meq via INTRAVENOUS

## 2020-10-04 MED ORDER — POTASSIUM CHLORIDE 10 MEQ/100ML IV SOLN
INTRAVENOUS | Status: AC
  Filled 2020-10-04: qty 400

## 2020-10-04 MED ORDER — DIPHENHYDRAMINE HCL 25 MG PO CAPS
ORAL_CAPSULE | ORAL | Status: AC
  Filled 2020-10-04: qty 1

## 2020-10-04 MED ORDER — ACETAMINOPHEN 325 MG PO TABS
ORAL_TABLET | ORAL | Status: AC
  Filled 2020-10-04: qty 2

## 2020-10-04 NOTE — Assessment & Plan Note (Signed)
I have reviewed multiple imaging studies with the patient and her husband Overall, she has positive response to treatment The radiologist commented on mixed response The slightly enlarged pulmonary nodule, in my opinion, does not constitute real progression I cannot explain the reason for new onset of right hydronephrosis but scarring near the cervix could contribute to this Overall, the patient tolerated treatment poorly due to pancytopenia Today, I recommend blood transfusion support and potassium replacement therapy I plan to see her again once we are able to get her hydronephrosis resolved

## 2020-10-04 NOTE — Assessment & Plan Note (Signed)
I have reviewed the case briefly with urologist on-call I will send urgent urology consult to see if internal stent is feasible My preference would be to try to get her internal stent rather than percutaneous stent Her recent renal function is stable

## 2020-10-04 NOTE — Assessment & Plan Note (Signed)
She has recent flare of lupus I recommend 40 mg of prednisone for few days I will call and check on her end of the week

## 2020-10-04 NOTE — Assessment & Plan Note (Signed)
She is profoundly symptomatic from her anemia In anticipation for future surgery, I recommend blood transfusion today  We discussed some of the risks, benefits, and alternatives of blood transfusions. The patient is symptomatic from anemia and the hemoglobin level is critically low.  Some of the side-effects to be expected including risks of transfusion reactions, chills, infection, syndrome of volume overload and risk of hospitalization from various reasons and the patient is willing to proceed and went ahead to sign consent today.

## 2020-10-04 NOTE — Telephone Encounter (Signed)
Thanks

## 2020-10-04 NOTE — Patient Instructions (Signed)
Blood Transfusion, Adult, Care After This sheet gives you information about how to care for yourself after your procedure. Your doctor may also give you more specific instructions. If you have problems or questions, contact your doctor. What can I expect after the procedure? After the procedure, it is common to have:  Bruising and soreness at the IV site.  A fever or chills on the day of the procedure. This may be your body's response to the new blood cells received.  A headache. Follow these instructions at home: Insertion site care  Follow instructions from your doctor about how to take care of your insertion site. This is where an IV tube was put into your vein. Make sure you: ? Wash your hands with soap and water before and after you change your bandage (dressing). If you cannot use soap and water, use hand sanitizer. ? Change your bandage as told by your doctor.  Check your insertion site every day for signs of infection. Check for: ? Redness, swelling, or pain. ? Bleeding from the site. ? Warmth. ? Pus or a bad smell.      General instructions  Take over-the-counter and prescription medicines only as told by your doctor.  Rest as told by your doctor.  Go back to your normal activities as told by your doctor.  Keep all follow-up visits as told by your doctor. This is important. Contact a doctor if:  You have itching or red, swollen areas of skin (hives).  You feel worried or nervous (anxious).  You feel weak after doing your normal activities.  You have redness, swelling, warmth, or pain around the insertion site.  You have blood coming from the insertion site, and the blood does not stop with pressure.  You have pus or a bad smell coming from the insertion site. Get help right away if:  You have signs of a serious reaction. This may be coming from an allergy or the body's defense system (immune system). Signs include: ? Trouble breathing or shortness of  breath. ? Swelling of the face or feeling warm (flushed). ? Fever or chills. ? Head, chest, or back pain. ? Dark pee (urine) or blood in the pee. ? Widespread rash. ? Fast heartbeat. ? Feeling dizzy or light-headed. You may receive your blood transfusion in an outpatient setting. If so, you will be told whom to contact to report any reactions. These symptoms may be an emergency. Do not wait to see if the symptoms will go away. Get medical help right away. Call your local emergency services (911 in the [place] Do not drive yourself to the hospital. Summary  Bruising and soreness at the IV site are common.  Check your insertion site every day for signs of infection.  Rest as told by your doctor. Go back to your normal activities as told by your doctor.  Get help right away if you have signs of a serious reaction. This information is not intended to replace advice given to you by your health care provider. Make sure you discuss any questions you have with your health care provider. Document Revised: 11/28/2018 Document Reviewed: 11/28/2018 Elsevier Patient Education  2021 [place].   Hypokalemia Hypokalemia means that the amount of potassium in the blood is lower than normal. Potassium is a chemical (electrolyte) that helps regulate the amount of fluid in the body. It also stimulates muscle tightening (contraction) and helps nerves work properly. Normally, most of the body's potassium is inside cells, and only a  very small amount is in the blood. Because the amount in the blood is so small, minor changes to potassium levels in the blood can be life-threatening. What are the causes? This condition may be caused by:  Antibiotic medicine.  Diarrhea or vomiting. Taking too much of a medicine that helps you have a bowel movement (laxative) can cause diarrhea and lead to hypokalemia.  Chronic kidney disease (CKD).  Medicines that help the body get rid of excess fluid  (diuretics).  Eating disorders, such as bulimia.  Low magnesium levels in the body.  Sweating a lot. What are the signs or symptoms? Symptoms of this condition include:  Weakness.  Constipation.  Fatigue.  Muscle cramps.  Mental confusion.  Skipped heartbeats or irregular heartbeat (palpitations).  Tingling or numbness. How is this diagnosed? This condition is diagnosed with a blood test. How is this treated? This condition may be treated by:  Taking potassium supplements by mouth.  Adjusting the medicines that you take.  Eating more foods that contain a lot of potassium. If your potassium level is very low, you may need to get potassium through an IV and be monitored in the hospital. Follow these instructions at home:  Take over-the-counter and prescription medicines only as told by your health care provider. This includes vitamins and supplements.  Eat a healthy diet. A healthy diet includes fresh fruits and vegetables, whole grains, healthy fats, and lean proteins.  If instructed, eat more foods that contain a lot of potassium. This includes: ? Nuts, such as peanuts and pistachios. ? Seeds, such as sunflower seeds and pumpkin seeds. ? Peas, lentils, and lima beans. ? Whole grain and bran cereals and breads. ? Fresh fruits and vegetables, such as apricots, avocado, bananas, cantaloupe, kiwi, oranges, tomatoes, asparagus, and potatoes. ? Orange juice. ? Tomato juice. ? Red meats. ? Yogurt.  Keep all follow-up visits as told by your health care provider. This is important.   Contact a health care provider if you:  Have weakness that gets worse.  Feel your heart pounding or racing.  Vomit.  Have diarrhea.  Have diabetes (diabetes mellitus) and you have trouble keeping your blood sugar (glucose) in your target range. Get help right away if you:  Have chest pain.  Have shortness of breath.  Have vomiting or diarrhea that lasts for more than 2  days.  Faint. Summary  Hypokalemia means that the amount of potassium in the blood is lower than normal.  This condition is diagnosed with a blood test.  Hypokalemia may be treated by taking potassium supplements, adjusting the medicines that you take, or eating more foods that are high in potassium.  If your potassium level is very low, you may need to get potassium through an IV and be monitored in the hospital. This information is not intended to replace advice given to you by your health care provider. Make sure you discuss any questions you have with your health care provider. Document Revised: 01/16/2018 Document Reviewed: 01/16/2018 Elsevier Patient Education  [place].

## 2020-10-04 NOTE — Telephone Encounter (Signed)
Called and faxed urgent referral to Mickel Baas, RN at [facility].

## 2020-10-04 NOTE — Telephone Encounter (Signed)
Called Mickel Baas back regarding referral.  She said Phyllis Hebert is scheduled with Dr. Diona Fanti on Thursday this week.

## 2020-10-04 NOTE — Assessment & Plan Note (Signed)
She has severe hypokalemia likely due to poor appetite and her diuretic therapy I recommend discontinuation of hydrochlorothiazide and we will give her aggressive IV potassium replacement therapy today

## 2020-10-04 NOTE — Progress Notes (Signed)
[place] OFFICE PROGRESS NOTE  Patient Care Team: [name], MD as PCP - General (Family Medicine) [name], MD as Consulting Physician (Obstetrics and Gynecology) [name], RN as Oncology Nurse Navigator (Oncology)  ASSESSMENT & PLAN:  Cervical cancer, FIGO stage IVB [facility]) I have reviewed multiple imaging studies with the patient and her husband Overall, she has positive response to treatment The radiologist commented on mixed response The slightly enlarged pulmonary nodule, in my opinion, does not constitute real progression I cannot explain the reason for new onset of right hydronephrosis but scarring near the cervix could contribute to this Overall, the patient tolerated treatment poorly due to pancytopenia Today, I recommend blood transfusion support and potassium replacement therapy I plan to see her again once we are able to get her hydronephrosis resolved  Bilateral hydronephrosis I have reviewed the case briefly with urologist on-call I will send urgent urology consult to see if internal stent is feasible My preference would be to try to get her internal stent rather than percutaneous stent Her recent renal function is stable  SLE (systemic lupus erythematosus) ([place]) She has recent flare of lupus I recommend 40 mg of prednisone for few days I will call and check on her end of the week  Pancytopenia, acquired ([place]) She is profoundly symptomatic from her anemia In anticipation for future surgery, I recommend blood transfusion today  We discussed some of the risks, benefits, and alternatives of blood transfusions. The patient is symptomatic from anemia and the hemoglobin level is critically low.  Some of the side-effects to be expected including risks of transfusion reactions, chills, infection, syndrome of volume overload and risk of hospitalization from various reasons and the patient is willing to proceed and went ahead to sign consent  today.   Hypokalemia She has severe hypokalemia likely due to poor appetite and her diuretic therapy I recommend discontinuation of hydrochlorothiazide and we will give her aggressive IV potassium replacement therapy today   Orders Placed This Encounter  Procedures  . CBC with Differential/Platelet    Standing Status:   Future    Number of Occurrences:   1    Standing Expiration Date:   10/04/2021  . Potassium    Standing Status:   Future    Number of Occurrences:   1    Standing Expiration Date:   10/04/2021  . Ambulatory referral to Urology    Referral Priority:   Urgent    Referral Type:   Consultation    Referral Reason:   Specialty Services Required    Requested Specialty:   Urology    Number of Visits Requested:   1  . Sample to Blood Bank    Standing Status:   Future    Number of Occurrences:   1    Standing Expiration Date:   10/04/2021  . Prepare RBC (crossmatch)    Standing Status:   Standing    Number of Occurrences:   1    Order Specific Question:   # of Units    Answer:   1 unit    Order Specific Question:   Transfusion Indications    Answer:   Symptomatic Anemia    Order Specific Question:   Special Requirements    Answer:   Irradiated    Order Specific Question:   Number of Units to Keep Ahead    Answer:   NO units ahead    Order Specific Question:   Instructions:  Answer:   Transfuse    Order Specific Question:   If emergent release call blood bank    Answer:   Not emergent release    All questions were answered. The patient knows to call the clinic with any problems, questions or concerns. The total time spent in the appointment was 55 minutes encounter with patients including review of chart and various tests results, discussions about plan of care and coordination of care plan   [name], MD 10/04/2020 12:08 PM  INTERVAL HISTORY: Please see below for problem oriented charting. She returns for further follow-up with family She complained of  excessive fatigue She has poor appetite and has lost some weight Her energy level is poor She denies recent bleeding She noted some recent flare of her lupus  SUMMARY OF ONCOLOGIC HISTORY: Oncology History Overview Note  PD-L1 CPS: 2   Cervical cancer, FIGO stage IVB ([place])  03/16/2020 Initial Diagnosis   The patient had history of new onset vaginal spotting on March 16, 2020.  She immediately called her OB/GYN's office and achieved an appointment with her OB/GYN's partner, Dr. Murrell Redden, who saw and evaluated the patient on March 19, 2020.  At that time a transvaginal ultrasound scan was performed which revealed a uterus measuring 5.3 x 8.4 x 4.6 cm with an ill-defined endometrium.  There is a complex cyst on the left ovary measuring 2.6 cm with peripheral blood flow noted.  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  Immunostains were positive for CEA and CDX2, negative for p16, ER, vimentin, and PAX8.  The differential diagnosis included primary gastric type endocervical adenocarcinoma (though this typically stains positive for PAX8) or a metastasis from pancreaticobiliary or upper GI primary.   03/29/2020 Pathology Results   1. Surgical [P], duodenum - PEPTIC DUODENITIS. - NO DYSPLASIA OR MALIGNANCY. 2. Surgical [P], gastric antrum and gastric body - REACTIVE GASTROPATHY. [place] IS NEGATIVE FOR HELICOBACTER PYLORI. - NO INTESTINAL METAPLASIA, DYSPLASIA, OR MALIGNANCY. 3. Surgical [P], gastric nodule - MILD REACTIVE GASTROPATHY. - NO INTESTINAL METAPLASIA, DYSPLASIA, OR MALIGNANCY. 4. Surgical [P], stomach, lesser curve ulcers - REACTIVE GASTROPATHY WITH EROSIONS. - NO INTESTINAL METAPLASIA, DYSPLASIA, OR MALIGNANCY. 5. Surgical [P], gastric polyps - HYPERPLASTIC POLYP. - NO INTESTINAL METAPLASIA, DYSPLASIA OR MALIGNANCY. 6. Surgical [P], colon, descending,  polyp - HYPERPLASTIC POLYP. - NO DYSPLASIA OR MALIGNANCY.   04/06/2020 Procedure   EGD report  Normal esophagus. - A few gastric polyps. Suspected fundic gland polyps. - Non-bleeding gastric ulcers with pigmented material. Biopsied. - A single gastric polyp/nodule. Resected and retrieved. - Erythematous duodenopathy. Biopsied. - The examination was otherwise normal.   04/06/2020 Procedure   Colonoscopy report - Non-bleeding internal hemorrhoids. - One 4 mm polyp in the descending colon, removed with a cold snare. Resected and retrieved. - The examination was otherwise normal on direct and retroflexion views   04/19/2020 PET scan   1. Hypermetabolic uterine cervix mass compatible with known primary cervical malignancy, with hypermetabolism extending into the upper third of the vagina and extending throughout the uterine body. No frank extrauterine extension. 2. Mild to moderate left hydroureteronephrosis to the level of the left UVJ, concerning for tumor involvement of the left UVJ. 3. Hypermetabolic right common iliac nodal metastasis. 4. Hypermetabolic sclerotic right ischial bone metastasis. 5. Two indeterminate small scattered solid pulmonary nodules, below PET resolution, warranting attention on chest CT follow-up in 3 months. 6.  Aortic  Atherosclerosis (ICD10-I70.0).   04/19/2020 Cancer Staging   Staging form: Cervix Uteri, AJCC Version 9 - Clinical stage from 04/19/2020: FIGO Stage IVB (cT3b, cN1a, cM1) - Signed by [name], MD on 04/19/2020   04/26/2020 Procedure   Successful placement of a right IJ approach Power Port with ultrasound and fluoroscopic guidance. The catheter is ready for use   04/30/2020 - 05/21/2020 Chemotherapy   The patient had cisplatin for chemotherapy treatment.     06/20/2020 Genetic Testing   Negative genetic testing on the CancerNext-Expanded+RNAinsight panel.  The CancerNext-Expanded gene panel offered by [facility] and includes sequencing and  rearrangement analysis for the following 77 genes: AIP, ALK, APC*, ATM*, AXIN2, BAP1, BARD1, BLM, BMPR1A, BRCA1*, BRCA2*, BRIP1*, CDC73, CDH1*, CDK4, CDKN1B, CDKN2A, CHEK2*, CTNNA1, DICER1, FANCC, FH, FLCN, GALNT12, KIF1B, LZTR1, MAX, MEN1, MET, MLH1*, MSH2*, MSH3, MSH6*, MUTYH*, NBN, NF1*, NF2, NTHL1, PALB2*, PHOX2B, PMS2*, POT1, PRKAR1A, PTCH1, PTEN*, RAD51C*, RAD51D*, RB1, RECQL, RET, SDHA, SDHAF2, SDHB, SDHC, SDHD, SMAD4, SMARCA4, SMARCB1, SMARCE1, STK11, SUFU, TMEM127, TP53*, TSC1, TSC2, VHL and XRCC2 (sequencing and deletion/duplication); EGFR, EGLN1, HOXB13, KIT, MITF, PDGFRA, POLD1, and POLE (sequencing only); EPCAM and GREM1 (deletion/duplication only). DNA and RNA analyses performed for * genes. The report date is June 20, 2020.   07/27/2020 PET scan   1. Enlarging and new bilateral pulmonary nodules are evident today. Larger nodules in the right upper lung show FDG accumulation. Imaging features compatible with metastatic disease. 2. New hypermetabolic lymph nodes identified in the retrocaval space of the upper abdomen and para-aortic space of the lower abdomen, consistent with new metastatic involvement. There is some persistent hypermetabolism associated with small lymph nodes along the right common iliac chain. 3. Interval decrease in hypermetabolism associated with the cervix. 4. Persistent, mildly progressive left hydroureteronephrosis. Left ureter is dilated down to the level it passes the cervix, just proximal to the UVJ. 5. Decreased hypermetabolism in the previously identified right ischial tuberosity lesion consistent with metastatic disease.  6. New punctate focus of hypermetabolism in the gastric fundus without underlying mass lesion evident by CT. Attention on follow-up recommended. 7. Tiny gas bubble in the bladder lumen is presumably secondary to recent instrumentation although bladder infection could have this appearance.   07/28/2020 -  Chemotherapy    Patient is on Treatment  Plan: CERVICAL PEMBROLIZUMAB/CARBOPLATIN + PACLITAXEL + BEVACIZUMAB Q21D       10/04/2020 Imaging   1. There are multiple bilateral pulmonary nodules, generally subsolid in composition. The majority of nodules are diminished in size, or even completely resolved. A nodule of the dependent superior segment left lower lobe however appears to be slightly increased in size, measuring 1.2 x 0.9 cm, previously 0.9 x 0.9 cm when measured similarly. Findings are generally consistent with treatment response of pulmonary metastatic disease, however with some evidence of mixed response. 2. No significant interval change in previously FDG avid subcentimeter retroperitoneal lymph nodes, consistent with nodal metastatic disease. 3. Redemonstrated sclerotic osseous lesion of the right ischial tuberosity, not significant changed. No new osseous lesions identified by CT. 4. Circumferential soft tissue thickening about the cervix and lower uterine segment, overall dimensions approximately 5.5 x 3.5 cm, not significantly changed, consistent with primary cervical malignancy. There is loss of fat plane to the adjacent posterior urinary bladder wall and thickening of the bladder wall, suggesting direct invasion. 5. Perirectal and presacral soft tissue stranding, slightly increased compared to prior examination and consistent with developing radiation change. 6. Worsened, severe left hydronephrosis and new, moderate right hydronephrosis  secondary to obstruction by cervical mass.     Metastasis to bone ([place])  04/19/2020 Initial Diagnosis   Metastasis to bone ([place])   07/28/2020 -  Chemotherapy    Patient is on Treatment Plan: CERVICAL PEMBROLIZUMAB/CARBOPLATIN + PACLITAXEL + BEVACIZUMAB Q21D         REVIEW OF SYSTEMS:   Constitutional: Denies fevers, chills  Eyes: Denies blurriness of vision Ears, nose, mouth, throat, and face: Denies mucositis or sore throat Respiratory: Denies cough, dyspnea or  wheezes Cardiovascular: Denies palpitation, chest discomfort or lower extremity swelling Lymphatics: Denies new lymphadenopathy or easy bruising Neurological:Denies numbness, tingling or new weaknesses Behavioral/Psych: Mood is stable, no new changes  All other systems were reviewed with the patient and are negative.  I have reviewed the past medical history, past surgical history, social history and family history with the patient and they are unchanged from previous note.  ALLERGIES:  is allergic to penicillins.  MEDICATIONS:  Current Outpatient Medications  Medication Sig Dispense Refill  . predniSONE (DELTASONE) 20 MG tablet Take 2 tablets (40 mg total) by mouth daily with breakfast. 60 tablet 1  . calcium carbonate (TUMS - DOSED IN MG ELEMENTAL CALCIUM) 500 MG chewable tablet Chew 1 tablet by mouth 2 (two) times daily.    . Cholecalciferol (VITAMIN D3) 2000 units TABS Take 2 tablets by mouth daily.    . clobetasol cream (TEMOVATE) 0.05 % Apply topically as needed. Left leg (Patient not taking: Reported on 08/19/2020)    . dexamethasone (DECADRON) 4 MG tablet Take 4 mg by mouth as directed.    . fenofibrate 160 MG tablet TAKE 1 TABLET BY MOUTH EVERY DAY (Patient taking differently: Take 160 mg by mouth daily with lunch.) 30 tablet 5  . lidocaine-prilocaine (EMLA) cream APPLY TO THE AFFECTED AREA(S) ONCE AS DIRECTED (Patient not taking: Reported on 08/19/2020) 30 g 3  . LORazepam (ATIVAN) 0.5 MG tablet Take 1 tablet (0.5 mg total) by mouth every 8 (eight) hours as needed for anxiety. 30 tablet 0  . magnesium oxide (MAG-OX) 400 (241.3 Mg) MG tablet Take 1 tablet (400 mg total) by mouth daily. 30 tablet 1  . ondansetron (ZOFRAN) 8 MG tablet TAKE 1 TABLET BY MOUTH EVERY 8 HOURS AS NEEDED. START ON THE 3RD DAY AFTER CISPLATIN CHEMOTHERAPY (Patient not taking: Reported on 08/19/2020) 30 tablet 1  . pantoprazole (PROTONIX) 40 MG tablet Take 1 tablet (40 mg total) by mouth 2 (two) times daily. 90  tablet 3  . prochlorperazine (COMPAZINE) 10 MG tablet TAKE 1 TABLET BY MOUTH EVERY 6 HOURS AS NEEDED FOR NAUSEA AND/OR VOMITING 90 tablet 1  . traMADol (ULTRAM) 50 MG tablet TAKE 2 TABLETS BY MOUTH EVERY 6 HOURS AS NEEDED. 60 tablet 0   No current facility-administered medications for this visit.   Facility-Administered Medications Ordered in Other Visits  Medication Dose Route Frequency Provider Last Rate Last Admin  . 0.9 %  sodium chloride infusion (Manually program via Guardrails IV Fluids)  250 mL Intravenous Once Alvy Bimler, Fadia Marlar, MD      . acetaminophen (TYLENOL) tablet 650 mg  650 mg Oral Once Alvy Bimler, Jeralyn Nolden, MD      . diphenhydrAMINE (BENADRYL) capsule 25 mg  25 mg Oral Once Alvy Bimler, Lukasz Rogus, MD      . heparin lock flush 100 unit/mL  500 Units Intracatheter Daily PRN Cedar Ditullio, MD      . potassium chloride 10 mEq in 100 mL IVPB  10 mEq Intravenous Q1 Hr x 4 Luceal Hollibaugh,  MD 100 mL/hr at 10/04/20 1114 10 mEq at 10/04/20 1114  . sodium chloride flush (NS) 0.9 % injection 10 mL  10 mL Intracatheter PRN Alvy Bimler, Kaisy Severino, MD        PHYSICAL EXAMINATION: ECOG PERFORMANCE STATUS: 2 - Symptomatic, <50% confined to bed  Vitals:   10/04/20 0908  BP: (!) 158/84  Pulse: (!) 105  Resp: 18  Temp: 98.1 F (36.7 C)  SpO2: 99%   Filed Weights   10/04/20 0908  Weight: 204 lb 1.6 oz (92.6 kg)    GENERAL:alert, no distress and comfortable.  She looks pale SKIN: Noted skin rash NEURO: alert & oriented x 3 with fluent speech, no focal motor/sensory deficits  LABORATORY DATA:  I have reviewed the data as listed    Component Value Date/Time   NA 140 10/01/2020 1429   NA 140 06/21/2016 1530   K 2.4 (LL) 10/04/2020 0957   CL 99 10/01/2020 1429   CO2 28 10/01/2020 1429   GLUCOSE 125 (H) 10/01/2020 1429   BUN 13 10/01/2020 1429   BUN 16 06/21/2016 1530   CREATININE 1.38 (H) 10/01/2020 1429   CREATININE 0.87 07/02/2017 1549   CALCIUM 9.3 10/01/2020 1429   PROT 7.2 10/01/2020 1429   PROT 7.3  06/21/2016 1530   ALBUMIN 3.0 (L) 10/01/2020 1429   ALBUMIN 4.3 06/21/2016 1530   AST 10 (L) 10/01/2020 1429   ALT 8 10/01/2020 1429   ALKPHOS 72 10/01/2020 1429   BILITOT 0.4 10/01/2020 1429   GFRNONAA 43 (L) 10/01/2020 1429   GFRAA 111 06/21/2016 1530    No results found for: SPEP, UPEP  Lab Results  Component Value Date   WBC 6.3 10/04/2020   NEUTROABS 5.7 10/04/2020   HGB 8.1 (L) 10/04/2020   HCT 25.1 (L) 10/04/2020   MCV 91.3 10/04/2020   PLT 106 (L) 10/04/2020      Chemistry      Component Value Date/Time   NA 140 10/01/2020 1429   NA 140 06/21/2016 1530   K 2.4 (LL) 10/04/2020 0957   CL 99 10/01/2020 1429   CO2 28 10/01/2020 1429   BUN 13 10/01/2020 1429   BUN 16 06/21/2016 1530   CREATININE 1.38 (H) 10/01/2020 1429   CREATININE 0.87 07/02/2017 1549      Component Value Date/Time   CALCIUM 9.3 10/01/2020 1429   ALKPHOS 72 10/01/2020 1429   AST 10 (L) 10/01/2020 1429   ALT 8 10/01/2020 1429   BILITOT 0.4 10/01/2020 1429       RADIOGRAPHIC STUDIES: I have reviewed multiple imaging studies with the patient and her husband I have personally reviewed the radiological images as listed and agreed with the findings in the report. CT CHEST ABDOMEN PELVIS W CONTRAST  Result Date: 10/03/2020 CLINICAL DATA:  Metastatic cervical cancer restaging EXAM: CT CHEST, ABDOMEN, AND PELVIS WITH CONTRAST TECHNIQUE: Multidetector CT imaging of the chest, abdomen and pelvis was performed following the standard protocol during bolus administration of intravenous contrast. CONTRAST:  66m ISOVUE-300 IOPAMIDOL (ISOVUE-300) INJECTION 61%, additional oral enteric contrast COMPARISON:  PET-CT, 07/27/2020, PET-CT, 04/19/2020 FINDINGS: CT CHEST FINDINGS Cardiovascular: Right chest port catheter. Normal heart size. No pericardial effusion. Mediastinum/Nodes: No enlarged mediastinal, hilar, or axillary lymph nodes. Thyroid gland, trachea, and esophagus demonstrate no significant findings.  Lungs/Pleura: There are multiple bilateral pulmonary nodules, generally subsolid in composition. The majority of nodules are diminished in size, for example a nodule of the medial posterior right upper lobe which has been reduced to an  elongated residual measuring approximately 0.9 x 0.3 cm, previously 0.9 x 0.6 cm (series 4, image 45). A small nodule of the central left upper lobe previously seen is almost entirely resolved, measuring no greater than 1-2 mm (series 4, image 60). A nodule of the dependent superior segment left lower lobe however appears to be slightly increased in size, measuring 1.2 x 0.9 cm, previously 0.9 x 0.9 cm when measured similarly (series 4, image 47). No pleural effusion or pneumothorax. Musculoskeletal: No chest wall mass or suspicious bone lesions identified. CT ABDOMEN PELVIS FINDINGS Hepatobiliary: No solid liver abnormality is seen. No gallstones, gallbladder wall thickening, or biliary dilatation. Pancreas: Unremarkable. No pancreatic ductal dilatation or surrounding inflammatory changes. Spleen: Normal in size without significant abnormality. Adrenals/Urinary Tract: Adrenal glands are unremarkable. There is redemonstrated severe left hydronephrosis, worsened compared to prior examination, with relative atrophy and cortical hypoenhancement of the left kidney. There is new moderate right hydronephrosis and hydroureter. The distal ureters are obstructed in the pelvis by soft tissue about the uterine cervix (series 2, image 110). Thickening of the urinary bladder wall. Stomach/Bowel: Stomach is within normal limits. Appendix appears normal. No evidence of bowel wall thickening, distention, or inflammatory changes. Vascular/Lymphatic: No significant vascular findings are present. No significant interval change in subcentimeter retroperitoneal lymph nodes, measuring up to 1.0 x 0.6 cm and previously FDG avid (series 2, image 76) Reproductive: Heterogeneous appearance and enhancement of  the uterine fundus (series 8, image 112). Circumferential soft tissue thickening about the cervix and lower uterine segment, overall dimensions approximately 5.5 x 3.5 cm, not significantly changed (series 2, image 108). There is loss of fat plane to the adjacent posterior urinary bladder wall and thickening of the bladder wall. Perirectal and presacral soft tissue stranding, slightly increased compared to prior examination. Other: No abdominal wall hernia or abnormality. No abdominopelvic ascites. Musculoskeletal: Redemonstrated sclerotic osseous lesion of the right ischial tuberosity (series 2, image 119). IMPRESSION: 1. There are multiple bilateral pulmonary nodules, generally subsolid in composition. The majority of nodules are diminished in size, or even completely resolved. A nodule of the dependent superior segment left lower lobe however appears to be slightly increased in size, measuring 1.2 x 0.9 cm, previously 0.9 x 0.9 cm when measured similarly. Findings are generally consistent with treatment response of pulmonary metastatic disease, however with some evidence of mixed response. 2. No significant interval change in previously FDG avid subcentimeter retroperitoneal lymph nodes, consistent with nodal metastatic disease. 3. Redemonstrated sclerotic osseous lesion of the right ischial tuberosity, not significant changed. No new osseous lesions identified by CT. 4. Circumferential soft tissue thickening about the cervix and lower uterine segment, overall dimensions approximately 5.5 x 3.5 cm, not significantly changed, consistent with primary cervical malignancy. There is loss of fat plane to the adjacent posterior urinary bladder wall and thickening of the bladder wall, suggesting direct invasion. 5. Perirectal and presacral soft tissue stranding, slightly increased compared to prior examination and consistent with developing radiation change. 6. Worsened, severe left hydronephrosis and new, moderate right  hydronephrosis secondary to obstruction by cervical mass. Electronically Signed   By: Eddie Candle M.D.   On: 10/03/2020 18:27

## 2020-10-04 NOTE — Telephone Encounter (Signed)
CRITICAL VALUE STICKER  CRITICAL VALUE: Potassium 2.4  RECEIVER (on-site recipient of call): Evette Georges, [place] NOTIFIED: 10:43 am  MESSENGER (representative from lab): Pam  MD NOTIFIED: Dr. Alvy Bimler   TIME OF NOTIFICATION: 10: 47 am  RESPONSE: Harrel Lemon, RN to give to Dr. Alvy Bimler

## 2020-10-04 NOTE — Telephone Encounter (Signed)
Scheduled follow-up appointments per 4/18 schedule message. Patient is aware.

## 2020-10-05 LAB — TYPE AND SCREEN
ABO/RH(D): B NEG
Antibody Screen: NEGATIVE
Unit division: 0

## 2020-10-05 LAB — BPAM RBC
Blood Product Expiration Date: 202205112359
ISSUE DATE / TIME: 202204181515
Unit Type and Rh: 1700

## 2020-10-07 ENCOUNTER — Other Ambulatory Visit: Payer: Self-pay

## 2020-10-07 ENCOUNTER — Encounter (HOSPITAL_COMMUNITY): Payer: Self-pay | Admitting: Emergency Medicine

## 2020-10-07 ENCOUNTER — Inpatient Hospital Stay (HOSPITAL_COMMUNITY)
Admission: EM | Admit: 2020-10-07 | Discharge: 2020-10-14 | DRG: 988 | Disposition: A | Discharge status: Home / Self Care | Payer: Managed Care, Other (non HMO) | Attending: Family Medicine | Admitting: Family Medicine

## 2020-10-07 ENCOUNTER — Telehealth: Payer: Self-pay

## 2020-10-07 DIAGNOSIS — Z8616 Personal history of COVID-19: Secondary | ICD-10-CM

## 2020-10-07 DIAGNOSIS — N939 Abnormal uterine and vaginal bleeding, unspecified: Secondary | ICD-10-CM | POA: Diagnosis present

## 2020-10-07 DIAGNOSIS — E785 Hyperlipidemia, unspecified: Secondary | ICD-10-CM | POA: Diagnosis present

## 2020-10-07 DIAGNOSIS — D539 Nutritional anemia, unspecified: Secondary | ICD-10-CM

## 2020-10-07 DIAGNOSIS — Z803 Family history of malignant neoplasm of breast: Secondary | ICD-10-CM

## 2020-10-07 DIAGNOSIS — C539 Malignant neoplasm of cervix uteri, unspecified: Secondary | ICD-10-CM | POA: Diagnosis not present

## 2020-10-07 DIAGNOSIS — Z8711 Personal history of peptic ulcer disease: Secondary | ICD-10-CM

## 2020-10-07 DIAGNOSIS — Z923 Personal history of irradiation: Secondary | ICD-10-CM

## 2020-10-07 DIAGNOSIS — C7951 Secondary malignant neoplasm of bone: Secondary | ICD-10-CM | POA: Diagnosis present

## 2020-10-07 DIAGNOSIS — K5909 Other constipation: Secondary | ICD-10-CM

## 2020-10-07 DIAGNOSIS — Z79899 Other long term (current) drug therapy: Secondary | ICD-10-CM

## 2020-10-07 DIAGNOSIS — K5900 Constipation, unspecified: Secondary | ICD-10-CM | POA: Diagnosis not present

## 2020-10-07 DIAGNOSIS — Z8049 Family history of malignant neoplasm of other genital organs: Secondary | ICD-10-CM

## 2020-10-07 DIAGNOSIS — D61818 Other pancytopenia: Secondary | ICD-10-CM | POA: Diagnosis present

## 2020-10-07 DIAGNOSIS — E876 Hypokalemia: Secondary | ICD-10-CM | POA: Diagnosis present

## 2020-10-07 DIAGNOSIS — R627 Adult failure to thrive: Secondary | ICD-10-CM | POA: Diagnosis present

## 2020-10-07 DIAGNOSIS — K219 Gastro-esophageal reflux disease without esophagitis: Secondary | ICD-10-CM | POA: Diagnosis present

## 2020-10-07 DIAGNOSIS — C7800 Secondary malignant neoplasm of unspecified lung: Secondary | ICD-10-CM | POA: Diagnosis present

## 2020-10-07 DIAGNOSIS — N289 Disorder of kidney and ureter, unspecified: Secondary | ICD-10-CM | POA: Diagnosis present

## 2020-10-07 DIAGNOSIS — F419 Anxiety disorder, unspecified: Secondary | ICD-10-CM | POA: Diagnosis present

## 2020-10-07 DIAGNOSIS — Z8719 Personal history of other diseases of the digestive system: Secondary | ICD-10-CM

## 2020-10-07 DIAGNOSIS — Z20822 Contact with and (suspected) exposure to covid-19: Secondary | ICD-10-CM | POA: Diagnosis present

## 2020-10-07 DIAGNOSIS — N1330 Unspecified hydronephrosis: Secondary | ICD-10-CM

## 2020-10-07 DIAGNOSIS — Z8249 Family history of ischemic heart disease and other diseases of the circulatory system: Secondary | ICD-10-CM

## 2020-10-07 DIAGNOSIS — Z825 Family history of asthma and other chronic lower respiratory diseases: Secondary | ICD-10-CM

## 2020-10-07 DIAGNOSIS — L932 Other local lupus erythematosus: Secondary | ICD-10-CM | POA: Diagnosis present

## 2020-10-07 DIAGNOSIS — G893 Neoplasm related pain (acute) (chronic): Secondary | ICD-10-CM | POA: Diagnosis present

## 2020-10-07 DIAGNOSIS — M3219 Other organ or system involvement in systemic lupus erythematosus: Secondary | ICD-10-CM | POA: Diagnosis present

## 2020-10-07 DIAGNOSIS — Z823 Family history of stroke: Secondary | ICD-10-CM

## 2020-10-07 DIAGNOSIS — I10 Essential (primary) hypertension: Secondary | ICD-10-CM | POA: Diagnosis present

## 2020-10-07 DIAGNOSIS — R3916 Straining to void: Secondary | ICD-10-CM | POA: Diagnosis present

## 2020-10-07 DIAGNOSIS — Z806 Family history of leukemia: Secondary | ICD-10-CM

## 2020-10-07 DIAGNOSIS — N1339 Other hydronephrosis: Secondary | ICD-10-CM | POA: Diagnosis present

## 2020-10-07 NOTE — Telephone Encounter (Signed)
Called and given below message. She verbalized understanding. She is doing well with no complaints. She sees Urology tomorrow at 2 pm. Instructed to call the office if needed. She verbalized understanding.

## 2020-10-07 NOTE — Telephone Encounter (Signed)
-----   Message from [name], MD sent at 10/07/2020  9:36 AM EDT ----- Can you call and ask how she is doing?

## 2020-10-07 NOTE — ED Triage Notes (Signed)
Pt BIB EMS from home c/o vaginal bleeding. Hx of stage 4 cervical cancer.

## 2020-10-08 ENCOUNTER — Encounter (HOSPITAL_COMMUNITY): Payer: Self-pay | Admitting: Emergency Medicine

## 2020-10-08 ENCOUNTER — Other Ambulatory Visit (HOSPITAL_BASED_OUTPATIENT_CLINIC_OR_DEPARTMENT_OTHER): Payer: Managed Care, Other (non HMO) | Admitting: Oncology

## 2020-10-08 DIAGNOSIS — Z8719 Personal history of other diseases of the digestive system: Secondary | ICD-10-CM | POA: Diagnosis not present

## 2020-10-08 DIAGNOSIS — N939 Abnormal uterine and vaginal bleeding, unspecified: Secondary | ICD-10-CM | POA: Diagnosis present

## 2020-10-08 DIAGNOSIS — D649 Anemia, unspecified: Secondary | ICD-10-CM | POA: Diagnosis not present

## 2020-10-08 DIAGNOSIS — Z9221 Personal history of antineoplastic chemotherapy: Secondary | ICD-10-CM | POA: Diagnosis not present

## 2020-10-08 DIAGNOSIS — R3916 Straining to void: Secondary | ICD-10-CM | POA: Diagnosis present

## 2020-10-08 DIAGNOSIS — K5900 Constipation, unspecified: Secondary | ICD-10-CM | POA: Diagnosis not present

## 2020-10-08 DIAGNOSIS — Z8616 Personal history of COVID-19: Secondary | ICD-10-CM | POA: Diagnosis not present

## 2020-10-08 DIAGNOSIS — N1339 Other hydronephrosis: Secondary | ICD-10-CM | POA: Diagnosis present

## 2020-10-08 DIAGNOSIS — C519 Malignant neoplasm of vulva, unspecified: Secondary | ICD-10-CM

## 2020-10-08 DIAGNOSIS — R627 Adult failure to thrive: Secondary | ICD-10-CM | POA: Diagnosis present

## 2020-10-08 DIAGNOSIS — R634 Abnormal weight loss: Secondary | ICD-10-CM | POA: Diagnosis not present

## 2020-10-08 DIAGNOSIS — K219 Gastro-esophageal reflux disease without esophagitis: Secondary | ICD-10-CM | POA: Diagnosis present

## 2020-10-08 DIAGNOSIS — D539 Nutritional anemia, unspecified: Secondary | ICD-10-CM | POA: Diagnosis not present

## 2020-10-08 DIAGNOSIS — M329 Systemic lupus erythematosus, unspecified: Secondary | ICD-10-CM | POA: Diagnosis not present

## 2020-10-08 DIAGNOSIS — N289 Disorder of kidney and ureter, unspecified: Secondary | ICD-10-CM | POA: Diagnosis present

## 2020-10-08 DIAGNOSIS — Z20822 Contact with and (suspected) exposure to covid-19: Secondary | ICD-10-CM | POA: Diagnosis present

## 2020-10-08 DIAGNOSIS — F419 Anxiety disorder, unspecified: Secondary | ICD-10-CM | POA: Diagnosis present

## 2020-10-08 DIAGNOSIS — C7951 Secondary malignant neoplasm of bone: Secondary | ICD-10-CM | POA: Diagnosis present

## 2020-10-08 DIAGNOSIS — Z923 Personal history of irradiation: Secondary | ICD-10-CM | POA: Diagnosis not present

## 2020-10-08 DIAGNOSIS — Z825 Family history of asthma and other chronic lower respiratory diseases: Secondary | ICD-10-CM | POA: Diagnosis not present

## 2020-10-08 DIAGNOSIS — Z8249 Family history of ischemic heart disease and other diseases of the circulatory system: Secondary | ICD-10-CM | POA: Diagnosis not present

## 2020-10-08 DIAGNOSIS — E876 Hypokalemia: Secondary | ICD-10-CM | POA: Diagnosis present

## 2020-10-08 DIAGNOSIS — C539 Malignant neoplasm of cervix uteri, unspecified: Secondary | ICD-10-CM | POA: Diagnosis present

## 2020-10-08 DIAGNOSIS — N1330 Unspecified hydronephrosis: Secondary | ICD-10-CM | POA: Diagnosis not present

## 2020-10-08 DIAGNOSIS — Z8711 Personal history of peptic ulcer disease: Secondary | ICD-10-CM | POA: Diagnosis not present

## 2020-10-08 DIAGNOSIS — I10 Essential (primary) hypertension: Secondary | ICD-10-CM | POA: Diagnosis present

## 2020-10-08 DIAGNOSIS — D61818 Other pancytopenia: Secondary | ICD-10-CM | POA: Diagnosis present

## 2020-10-08 DIAGNOSIS — R630 Anorexia: Secondary | ICD-10-CM | POA: Diagnosis not present

## 2020-10-08 DIAGNOSIS — L932 Other local lupus erythematosus: Secondary | ICD-10-CM | POA: Diagnosis not present

## 2020-10-08 DIAGNOSIS — C7800 Secondary malignant neoplasm of unspecified lung: Secondary | ICD-10-CM | POA: Diagnosis present

## 2020-10-08 DIAGNOSIS — Z79899 Other long term (current) drug therapy: Secondary | ICD-10-CM | POA: Diagnosis not present

## 2020-10-08 DIAGNOSIS — G893 Neoplasm related pain (acute) (chronic): Secondary | ICD-10-CM | POA: Diagnosis present

## 2020-10-08 DIAGNOSIS — M3219 Other organ or system involvement in systemic lupus erythematosus: Secondary | ICD-10-CM | POA: Diagnosis present

## 2020-10-08 DIAGNOSIS — E785 Hyperlipidemia, unspecified: Secondary | ICD-10-CM | POA: Diagnosis present

## 2020-10-08 LAB — CBC WITH DIFFERENTIAL/PLATELET
Abs Immature Granulocytes: 0.14 10*3/uL — ABNORMAL HIGH (ref 0.00–0.07)
Basophils Absolute: 0 10*3/uL (ref 0.0–0.1)
Basophils Relative: 0 %
Eosinophils Absolute: 0 10*3/uL (ref 0.0–0.5)
Eosinophils Relative: 0 %
HCT: 25.6 % — ABNORMAL LOW (ref 36.0–46.0)
Hemoglobin: 8.5 g/dL — ABNORMAL LOW (ref 12.0–15.0)
Immature Granulocytes: 2 %
Lymphocytes Relative: 7 %
Lymphs Abs: 0.6 10*3/uL — ABNORMAL LOW (ref 0.7–4.0)
MCH: 30.2 pg (ref 26.0–34.0)
MCHC: 33.2 g/dL (ref 30.0–36.0)
MCV: 91.1 fL (ref 80.0–100.0)
Monocytes Absolute: 0.7 10*3/uL (ref 0.1–1.0)
Monocytes Relative: 8 %
Neutro Abs: 7.3 10*3/uL (ref 1.7–7.7)
Neutrophils Relative %: 83 %
Platelets: 121 10*3/uL — ABNORMAL LOW (ref 150–400)
RBC: 2.81 MIL/uL — ABNORMAL LOW (ref 3.87–5.11)
RDW: 15.9 % — ABNORMAL HIGH (ref 11.5–15.5)
WBC: 8.7 10*3/uL (ref 4.0–10.5)
nRBC: 0.7 % — ABNORMAL HIGH (ref 0.0–0.2)

## 2020-10-08 LAB — URINALYSIS, ROUTINE W REFLEX MICROSCOPIC
Bilirubin Urine: NEGATIVE
Glucose, UA: NEGATIVE mg/dL
Ketones, ur: NEGATIVE mg/dL
Leukocytes,Ua: NEGATIVE
Nitrite: NEGATIVE
Protein, ur: NEGATIVE mg/dL
Specific Gravity, Urine: 1.012 (ref 1.005–1.030)
pH: 6 (ref 5.0–8.0)

## 2020-10-08 LAB — CBC
HCT: 28.8 % — ABNORMAL LOW (ref 36.0–46.0)
Hemoglobin: 9.4 g/dL — ABNORMAL LOW (ref 12.0–15.0)
MCH: 30.5 pg (ref 26.0–34.0)
MCHC: 32.6 g/dL (ref 30.0–36.0)
MCV: 93.5 fL (ref 80.0–100.0)
Platelets: 89 10*3/uL — ABNORMAL LOW (ref 150–400)
RBC: 3.08 MIL/uL — ABNORMAL LOW (ref 3.87–5.11)
RDW: 16.3 % — ABNORMAL HIGH (ref 11.5–15.5)
WBC: 6.7 10*3/uL (ref 4.0–10.5)
nRBC: 0.9 % — ABNORMAL HIGH (ref 0.0–0.2)

## 2020-10-08 LAB — BASIC METABOLIC PANEL
Anion gap: 11 (ref 5–15)
Anion gap: 10 (ref 5–15)
Anion gap: 9 (ref 5–15)
BUN: 24 mg/dL — ABNORMAL HIGH (ref 8–23)
BUN: 19 mg/dL (ref 8–23)
BUN: 25 mg/dL — ABNORMAL HIGH (ref 8–23)
CO2: 24 mmol/L (ref 22–32)
CO2: 22 mmol/L (ref 22–32)
CO2: 24 mmol/L (ref 22–32)
Calcium: 9.1 mg/dL (ref 8.9–10.3)
Calcium: 7.8 mg/dL — ABNORMAL LOW (ref 8.9–10.3)
Calcium: 7.8 mg/dL — ABNORMAL LOW (ref 8.9–10.3)
Chloride: 104 mmol/L (ref 98–111)
Chloride: 108 mmol/L (ref 98–111)
Chloride: 105 mmol/L (ref 98–111)
Creatinine, Ser: 1.16 mg/dL — ABNORMAL HIGH (ref 0.44–1.00)
Creatinine, Ser: 1.12 mg/dL — ABNORMAL HIGH (ref 0.44–1.00)
Creatinine, Ser: 1.38 mg/dL — ABNORMAL HIGH (ref 0.44–1.00)
GFR, Estimated: 53 mL/min — ABNORMAL LOW (ref >60)
GFR, Estimated: 55 mL/min — ABNORMAL LOW (ref >60)
GFR, Estimated: 43 mL/min — ABNORMAL LOW (ref >60)
Glucose, Bld: 166 mg/dL — ABNORMAL HIGH (ref 70–99)
Glucose, Bld: 141 mg/dL — ABNORMAL HIGH (ref 70–99)
Glucose, Bld: 133 mg/dL — ABNORMAL HIGH (ref 70–99)
Potassium: 2.4 mmol/L — CL (ref 3.5–5.1)
Potassium: 2.4 mmol/L — CL (ref 3.5–5.1)
Potassium: 3.6 mmol/L (ref 3.5–5.1)
Sodium: 139 mmol/L (ref 135–145)
Sodium: 140 mmol/L (ref 135–145)
Sodium: 138 mmol/L (ref 135–145)

## 2020-10-08 LAB — HIV ANTIBODY (ROUTINE TESTING W REFLEX): HIV Screen 4th Generation wRfx: NONREACTIVE

## 2020-10-08 LAB — MAGNESIUM
Magnesium: 1.5 mg/dL — ABNORMAL LOW (ref 1.7–2.4)
Magnesium: 1.7 mg/dL (ref 1.7–2.4)

## 2020-10-08 LAB — PROTIME-INR
INR: 1 (ref 0.8–1.2)
Prothrombin Time: 13 seconds (ref 11.4–15.2)

## 2020-10-08 LAB — RESP PANEL BY RT-PCR (FLU A&B, COVID) ARPGX2
Influenza A by PCR: NEGATIVE
Influenza B by PCR: NEGATIVE
SARS Coronavirus 2 by RT PCR: NEGATIVE

## 2020-10-08 MED ORDER — TRANEXAMIC ACID-NACL 1000-0.7 MG/100ML-% IV SOLN
1000.0000 mg | INTRAVENOUS | Status: AC
  Administered 2020-10-08: 1000 mg via INTRAVENOUS
  Filled 2020-10-08: qty 100

## 2020-10-08 MED ORDER — HYDROMORPHONE HCL 1 MG/ML IJ SOLN
0.5000 mg | INTRAMUSCULAR | Status: AC | PRN
  Administered 2020-10-08 – 2020-10-09 (×3): 0.5 mg via INTRAVENOUS
  Filled 2020-10-08 (×3): qty 0.5

## 2020-10-08 MED ORDER — POTASSIUM CHLORIDE 10 MEQ/100ML IV SOLN
10.0000 meq | INTRAVENOUS | Status: AC
Start: 2020-10-08 — End: 2020-10-08
  Administered 2020-10-08 (×5): 10 meq via INTRAVENOUS
  Filled 2020-10-08 (×5): qty 100

## 2020-10-08 MED ORDER — MAGNESIUM SULFATE 2 GM/50ML IV SOLN
2.0000 g | Freq: Once | INTRAVENOUS | Status: AC
  Administered 2020-10-08: 2 g via INTRAVENOUS
  Filled 2020-10-08: qty 50

## 2020-10-08 MED ORDER — LACTATED RINGERS IV SOLN: INTRAVENOUS | Status: DC

## 2020-10-08 MED ORDER — POTASSIUM CHLORIDE 10 MEQ/100ML IV SOLN
10.0000 meq | INTRAVENOUS | Status: AC
  Administered 2020-10-08 (×5): 10 meq via INTRAVENOUS
  Filled 2020-10-08 (×4): qty 100

## 2020-10-08 MED ORDER — ACETAMINOPHEN 325 MG PO TABS
650.0000 mg | ORAL_TABLET | Freq: Four times a day (QID) | ORAL | Status: DC | PRN
Start: 2020-10-08 — End: 2020-10-14
  Administered 2020-10-09 (×2): 650 mg via ORAL
  Filled 2020-10-08 (×3): qty 2

## 2020-10-08 MED ORDER — FENTANYL CITRATE (PF) 100 MCG/2ML IJ SOLN
25.0000 ug | INTRAMUSCULAR | Status: DC | PRN
  Administered 2020-10-08 (×5): 25 ug via INTRAVENOUS
  Filled 2020-10-08 (×5): qty 2

## 2020-10-08 MED ORDER — CHLORHEXIDINE GLUCONATE CLOTH 2 % EX PADS
6.0000 | MEDICATED_PAD | Freq: Every day | CUTANEOUS | Status: DC
  Administered 2020-10-09 – 2020-10-14 (×5): 6 via TOPICAL

## 2020-10-08 MED ORDER — ORAL CARE MOUTH RINSE
15.0000 mL | Freq: Two times a day (BID) | OROMUCOSAL | Status: DC
  Administered 2020-10-08 – 2020-10-14 (×10): 15 mL via OROMUCOSAL

## 2020-10-08 MED ORDER — LORAZEPAM 2 MG/ML IJ SOLN
0.5000 mg | Freq: Four times a day (QID) | INTRAMUSCULAR | Status: DC | PRN
  Administered 2020-10-08 – 2020-10-09 (×2): 0.5 mg via INTRAVENOUS
  Filled 2020-10-08 (×4): qty 1

## 2020-10-08 MED ORDER — ACETAMINOPHEN 650 MG RE SUPP: 650.0000 mg | Freq: Four times a day (QID) | RECTAL | Status: DC | PRN

## 2020-10-08 NOTE — Progress Notes (Signed)
Carryover to the day admitter: This is a 65 year old female with a history of stage IV cervical cancer who follows with Dr. Laurell Josephs as outpatient oncologist, who is being admitted for vaginal bleeding. Dr. Laurell Josephs is currently out of town, so the ED physician discussed the patient's case with Dr. Eugenie Filler, who agreed to formally consult on the patient, and plans to see her in the morning.  He reportedly conveyed that intermittent vaginal bleeding in the setting of stage IV cervical cancer is not unanticipated.  He recommended a dose of TXA, which has been ordered.  The patient has had 2 small episodes of vaginal bleeding in the ED, both of which resolved spontaneously.  She is reportedly hemodynamically stable.  I have placed an admit order to the med telemetry floor, and completed basic admission orders via the adult admission order set.  She is on a clear liquid diet, with lactated Ringer's at 75 cc/h.  Presenting labs are notable for hypokalemia, with initial serum potassium of 2.4.  She is in the process receiving a total of 70 mEq of potassium chloride.  Add on serum magnesium level result is currently pending.  I have ordered repeat BMP to be checked at 7 AM to trend interval serum potassium level. Will repeat CBC to trend hemoglobin at that time as well.     Babs Bertin, DO Hospitalist

## 2020-10-08 NOTE — Progress Notes (Signed)
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 This is stage IV disease with pelvic and lung invollvement, but responding to chemo (last dose 09/07/2020). Chemotherapy included (in addition to carboplatin, paclitaxel and pembrolizumab) bevacizumab/Avastin, with increased risk of bleeding.  On the evening of 04/21 Aymar passed a small vaginal clot. There was a second two hours later, then frank hemorrhage at 10 PM, which brought her to the ED. She tells me since arrival she had had an additional 4 bleeds. She received 1 g tranexamic acid IV at 0200 today.  Other issues include persistent hypokalemia, bilateral hydronephrosis, history of cutaneous lupus. She is clear she would not want resuscitation in case of a terminal event but does want "anything that can work" done. Husband of 40 years in room.   Objective: white woman examined in bed Vitals:   10/08/20 0100 10/08/20 0538  BP: 128/69 125/80  Pulse: 99 (!) 101  Resp: 16 12  Temp:    SpO2: 96% 100%    There is no height or weight on file to calculate BMI.  Intake/Output Summary (Last 24 hours) at 10/08/2020 0842 Last data filed at 10/08/2020 0742 Gross per 24 hour  Intake 350 ml  Output --  Net 350 ml      Lungs no rales or wheezes--auscultated anterolaterally  Heart regular rate and rhythm  Abdomen soft, nontender  Neuro nonfocal, well-oriented, appropriate affect  Breast exam: deferred  CBG (last 3)  No results for input(s): GLUCAP in the last 72 hours.   Labs:  Lab Results  Component Value Date   WBC 6.7 10/08/2020   HGB 9.4 (L) 10/08/2020   HCT 28.8 (L) 10/08/2020   MCV 93.5 10/08/2020   PLT 89 (L) 10/08/2020   NEUTROABS 7.3 10/08/2020    @LASTCHEMISTRY @  Urine Studies No results for input(s): UHGB, CRYS in the last 72 hours.  Invalid  input(s): UACOL, UAPR, USPG, UPH, UTP, UGL, UKET, UBIL, UNIT, UROB, ULEU, UEPI, UWBC, URBC, UBAC, CAST, [place], [place]  Basic Metabolic Panel: Recent Labs  Lab 10/01/20 1429 10/04/20 0957 10/08/20 0040 10/08/20 0225 10/08/20 0655  NA 140  --  139  --  140  K 2.8*   < > 2.4*  --  2.4*  CL 99  --  104  --  108  CO2 28  --  24  --  22  GLUCOSE 125*  --  166*  --  141*  BUN 13  --  24*  --  19  CREATININE 1.38*  --  1.16*  --  1.12*  CALCIUM 9.3  --  9.1  --  7.8*  MG 1.7  --   --  1.5*  --    < > = values in this interval not displayed.   GFR Estimated Creatinine Clearance: 57.8 mL/min (A) (by C-G formula based on SCr of 1.12 mg/dL (H)). Liver Function Tests: Recent Labs  Lab 10/01/20 1429  AST 10*  ALT 8  ALKPHOS 72  BILITOT 0.4  PROT 7.2  ALBUMIN 3.0*   No results for input(s): LIPASE, AMYLASE in the last 168 hours. No results for input(s): AMMONIA in the last 168 hours. Coagulation profile Recent Labs  Lab 10/08/20 0225  INR 1.0    CBC: Recent Labs  Lab 10/01/20 1429 10/04/20 0957 10/08/20 0040 10/08/20 6440  WBC 5.8 6.3 8.7 6.7  NEUTROABS 4.7 5.7 7.3  --   HGB 8.0* 8.1* 8.5* 9.4*  HCT 24.7* 25.1* 25.6* 28.8*  MCV 91.1 91.3 91.1 93.5  PLT 90* 106* 121* 89*   Cardiac Enzymes: No results for input(s): CKTOTAL, CKMB, CKMBINDEX, TROPONINI in the last 168 hours. BNP: Invalid input(s): POCBNP CBG: No results for input(s): GLUCAP in the last 168 hours. D-Dimer No results for input(s): DDIMER in the last 72 hours. Hgb A1c No results for input(s): HGBA1C in the last 72 hours. Lipid Profile No results for input(s): CHOL, HDL, LDLCALC, TRIG, CHOLHDL, LDLDIRECT in the last 72 hours. Thyroid function studies No results for input(s): TSH, T4TOTAL, T3FREE, THYROIDAB in the last 72 hours.  Invalid input(s): FREET3 Anemia work up No results for input(s): VITAMINB12, FOLATE, FERRITIN, TIBC, IRON, RETICCTPCT in the last 72 hours. Microbiology Recent Results (from  the past 240 hour(s))  Resp Panel by RT-PCR (Flu A&B, Covid) Nasopharyngeal Swab     Status: None   Collection Time: 10/08/20  2:32 AM   Specimen: Nasopharyngeal Swab; Nasopharyngeal(NP) swabs in vial transport medium  Result Value Ref Range Status   SARS Coronavirus 2 by RT PCR NEGATIVE NEGATIVE Final    Comment: (NOTE) SARS-CoV-2 target nucleic acids are NOT DETECTED.  The SARS-CoV-2 RNA is generally detectable in upper respiratory specimens during the acute phase of infection. The lowest concentration of SARS-CoV-2 viral copies this assay can detect is 138 copies/mL. A negative result does not preclude SARS-Cov-2 infection and should not be used as the sole basis for treatment or other patient management decisions. A negative result may occur with  improper specimen collection/handling, submission of specimen other than nasopharyngeal swab, presence of viral mutation(s) within the areas targeted by this assay, and inadequate number of viral copies(<138 copies/mL). A negative result must be combined with clinical observations, patient history, and epidemiological information. The expected result is Negative.  Fact Sheet for Patients:  EntrepreneurPulse.com.au  Fact Sheet for Healthcare Providers:  IncredibleEmployment.be  This test is no t yet approved or cleared by the [place] FDA and  has been authorized for detection and/or diagnosis of SARS-CoV-2 by FDA under an Emergency Use Authorization (EUA). This EUA will remain  in effect (meaning this test can be used) for the duration of the COVID-19 declaration under Section 564(b)(1) of the Act, 21 U.S.C.section 360bbb-3(b)(1), unless the authorization is terminated  or revoked sooner.       Influenza A by PCR NEGATIVE NEGATIVE Final   Influenza B by PCR NEGATIVE NEGATIVE Final    Comment: (NOTE) The Xpert Xpress SARS-CoV-2/FLU/RSV plus assay is intended as an aid in the diagnosis of  influenza from Nasopharyngeal swab specimens and should not be used as a sole basis for treatment. Nasal washings and aspirates are unacceptable for Xpert Xpress SARS-CoV-2/FLU/RSV testing.  Fact Sheet for Patients: EntrepreneurPulse.com.au  Fact Sheet for Healthcare Providers: IncredibleEmployment.be  This test is not yet approved or cleared by the [place] FDA and has been authorized for detection and/or diagnosis of SARS-CoV-2 by FDA under an Emergency Use Authorization (EUA). This EUA will remain in effect (meaning this test can be used) for the duration of the COVID-19 declaration under Section 564(b)(1) of the Act, 21 U.S.C. section 360bbb-3(b)(1), unless the authorization is terminated or revoked.  Performed at [facility], [place] [address], [place], [place] [address]       Studies:  No results found.  Assessment: 65 y.o. [place] woman with stage  IV endometrial adenocarcinoma, admitted with hemorrhage; other problems include persistent hypokalemia, bilateral hydronephrosis, cutaneous lupus (inactive), end of life concerns  Plan:  Brizza has continued to hemorrhage in the ED but feels better despite that, She received bevacizumab/Avastin most recently 03/22, which is several half-lives distant, but given her renal insufficiency this drug may still be interfering with her ability to stop bleeding (does not allow new blood vessel formation or repair). Would continue the tranexamic acid until bleeding stops, then an additional 24 h  She had an appointment with urology ([facility]) today at 2 PM to discuss ureteral stenting. Suggest inpatient urology consult to move that discussion forward even though her creatinine at this point is stable.  Patient is clear she does not want resuscitation in case of a terminal event. I have entered a limited resuscitation order. However my expectation is with continuing support  hemorrhage will stop and patient will be able to return home, perhaps in the next 48-72 hours  Would keep Hb >9.0 and platelets >50K  Dr Alvy Bimler is currently unavailable but will be back 10/11/2020 and I will copy her on this note. Please contact our coverage this weekend as needed   Chauncey Cruel, MD 10/08/2020  8:42 AM Medical Oncology and Hematology [facility] [place], [place] [address] Tel. [phone]    Fax. [phone]

## 2020-10-08 NOTE — Progress Notes (Signed)
   10/08/20 1700  Notify: Charge Nurse/RN  Name of Charge Nurse/RN Notified Adline Peals, Therapist, sports.  Date Charge Nurse/RN Notified 10/08/20  Time Charge Nurse/RN Notified 1640  Notify: Provider  Provider Name/Title R. Shanon Brow  Date Provider Notified 10/08/20  Time Provider Notified 1745  Notification Type Page  Notification Reason Other (Comment) (Per protocol)

## 2020-10-08 NOTE — ED Notes (Signed)
Bedding, pads and brief changed. Pt cleaned and repositioned. Cont with vaginal bleeding and clots. MD aware.

## 2020-10-08 NOTE — Progress Notes (Signed)
   10/08/20 1619  Assess: MEWS Score  Temp 98.8 F (37.1 C)  BP 108/64  Pulse Rate (!) 112  Resp 16  SpO2 99 %  Assess: MEWS Score  MEWS Temp 0  MEWS Systolic 0  MEWS Pulse 2  MEWS RR 0  MEWS LOC 0  MEWS Score 2  MEWS Score Color Yellow  Assess: if the MEWS score is Yellow or Red  Were vital signs taken at a resting state? Yes  Focused Assessment No change from prior assessment  Early Detection of Sepsis Score *See Row Information* Low  MEWS guidelines implemented *See Row Information* Yes  Treat  MEWS Interventions Escalated (See documentation below)  Pain Scale 0-10  Pain Score 4  Pain Intervention(s) Rest;Relaxation (Pt more comfortable after foley insertion. Will continue to monitor.)  Take Vital Signs  Increase Vital Sign Frequency  Yellow: Q 2hr X 2 then Q 4hr X 2, if remains yellow, continue Q 4hrs  Escalate  MEWS: Escalate Yellow: discuss with charge nurse/RN and consider discussing with provider and RRT

## 2020-10-08 NOTE — H&P (Signed)
History and Physical    ISELLA Hebert [id number] DOB: July 18, 1955 DOA: 10/07/2020  PCP: [name], MD  Patient coming from: Home  Chief Complaint: Vaginal bleeding  HPI: Phyllis Hebert is a 65 y.o. female with medical history significant of stage IV cervical cancer currently on chemotherapy comes in with vaginal bleeding.  Patient was diagnosed November of last year and has not had any vaginal bleeding surprisingly.  Last night she had a lot of the bleeding came to the emergency room for further evaluation.  Also for the past week she did not get her routine chemotherapy because her potassium levels were very low.  She was given a sounds like 40 mEq IV as an outpatient but was not given any oral repletion.  She comes in today also very tired unsure how much of this is due to her chemotherapy or due to her electrolyte abnormalities.  She is also had a very poor p.o. intake justifiably.  She does get nauseous but does not vomit a whole lot.  Patient also found to have a potassium of 2.4 and a magnesium level of 1.5.  She did get transfused a unit of blood last week for her pancytopenia associated with her chemotherapy.  Her hemoglobin today is above 9 which is better than her usual.  Patient be referred for admission for vaginal bleeding and for electrolyte abnormalities.  Her oncologist has been called and will be seeing patient in consultation also.  Review of Systems: As per HPI otherwise 10 point review of systems negative.   Past Medical History:  Diagnosis Date  . Anemia   . Anxiety   . Arthritis   . Cervical cancer, FIGO stage IVB [facility]) oncologist--- dr gorsuch/ dr Sondra Come   dx inital dx 09/ 2021, w/ mets to bone, started chemo 04-30-2020 and stopped 05-21-2020 due to worsening pancytopenia;  completed IMRT 06-16-2020 and scheduled to start brachytherapy boost high dose 06-21-2020  . Chemotherapy-induced nausea   . Cutaneous lupus erythematosus    followed by [facility]--  Marella Chimes PA  . Dyslipidemia   . Family history of breast cancer   . Family history of uterine cancer   . GERD (gastroesophageal reflux disease)   . Hemorrhoids   . History of 2019 novel coronavirus disease (COVID-19) 06/20/2019   [facility] , had mild symptoms that resolved  . History of colon polyps 04/06/2020   hyerplastic  . History of external beam radiation therapy 05-03-2020  to 06-16-2020   cervical cancer  . History of gastric polyp 04/06/2020   hyperplastic  . History of gastric ulcer   . History of radiation therapy 05/03/20-06/16/20 and 06/21/20-07/19/20   Cervical IMRT and Cervical HDR Tandem & Ring: Dr Gery Pray  . Pancytopenia due to chemotherapy ([place])   . Wears glasses     Past Surgical History:  Procedure Laterality Date  . CATARACT EXTRACTION W/ INTRAOCULAR LENS IMPLANT Left 12/2019  . COLONOSCOPY WITH ESOPHAGOGASTRODUODENOSCOPY (EGD)  04-06-2020  dr Raliegh Ip. beavers  . ENDOMETRIAL ABLATION W/ NOVASURE  2010  . IR IMAGING GUIDED PORT INSERTION  04/26/2020  . OPERATIVE ULTRASOUND N/A 06/21/2020   Procedure: OPERATIVE ULTRASOUND;  Surgeon: Gery Pray, MD;  Location: [facility];  Service: Urology;  Laterality: N/A;  TECH  . OPERATIVE ULTRASOUND N/A 07/01/2020   Procedure: OPERATIVE ULTRASOUND;  Surgeon: Gery Pray, MD;  Location[facility];  Service: Urology;  Laterality: N/A;  TECH  . OPERATIVE ULTRASOUND  N/A 07/06/2020   Procedure: OPERATIVE ULTRASOUND;  Surgeon: Gery Pray, MD;  Location: [facility];  Service: Urology;  Laterality: N/A;  NEED TECH AND ULTRASOUND TO ARRIVE AT 8AM  . OPERATIVE ULTRASOUND N/A 07/14/2020   Procedure: OPERATIVE ULTRASOUND;  Surgeon: Gery Pray, MD;  Location: [facility];  Service: Urology;  Laterality: N/A;  . OPERATIVE ULTRASOUND N/A 07/19/2020   Procedure: OPERATIVE ULTRASOUND;  Surgeon: Gery Pray, MD;  Location: [facility];   Service: Urology;  Laterality: N/A;  . TANDEM RING INSERTION N/A 06/21/2020   Procedure: TANDEM RING INSERTION;  Surgeon: Gery Pray, MD;  Location: [facility];  Service: Urology;  Laterality: N/A;  . TANDEM RING INSERTION N/A 07/01/2020   Procedure: TANDEM RING INSERTION;  Surgeon: Gery Pray, MD;  Location: [facility];  Service: Urology;  Laterality: N/A;  . TANDEM RING INSERTION N/A 07/06/2020   Procedure: TANDEM RING INSERTION;  Surgeon: Gery Pray, MD;  Location: [facility];  Service: Urology;  Laterality: N/A;  . TANDEM RING INSERTION N/A 07/14/2020   Procedure: TANDEM RING INSERTION;  Surgeon: Gery Pray, MD;  Location: [facility];  Service: Urology;  Laterality: N/A;  . TANDEM RING INSERTION N/A 07/19/2020   Procedure: TANDEM RING INSERTION;  Surgeon: Gery Pray, MD;  Location: [facility];  Service: Urology;  Laterality: N/A;     reports that she has never smoked. She has never used smokeless tobacco. She reports that she does not drink alcohol and does not use drugs.  Allergies  Allergen Reactions  . Penicillins Rash    Family History  Problem Relation Age of Onset  . Hypertension Mother   . Emphysema Mother   . Uterine cancer Mother 56  . Hypertension Father   . Stroke Father   . Hypertension Brother   . Healthy Daughter   . Breast cancer Maternal Aunt        d. 41  . Leukemia Cousin        d. 51s    Prior to Admission medications   Medication Sig Start Date End Date Taking? Authorizing Provider  calcium carbonate (TUMS - DOSED IN MG ELEMENTAL CALCIUM) 500 MG chewable tablet Chew 1 tablet by mouth 2 (two) times daily.    [provider]  Cholecalciferol (VITAMIN D3) 2000 units TABS Take 2 tablets by mouth daily.    [provider]  clobetasol cream (TEMOVATE) 0.05 % Apply topically as needed. Left leg Patient not taking: Reported on 08/19/2020 10/17/19   [provider]  dexamethasone (DECADRON) 4 MG tablet Take 4 mg by mouth as directed.    [provider]  fenofibrate 160 MG tablet TAKE 1 TABLET BY MOUTH EVERY DAY Patient taking differently: Take 160 mg by mouth daily with lunch. 02/25/20   [name], MD  lidocaine-prilocaine (EMLA) cream APPLY TO THE AFFECTED AREA(S) ONCE AS DIRECTED Patient not taking: Reported on 08/19/2020 04/19/20 04/19/21  [name], MD  LORazepam (ATIVAN) 0.5 MG tablet Take 1 tablet (0.5 mg total) by mouth every 8 (eight) hours as needed for anxiety. 05/05/20   [name], MD  magnesium oxide (MAG-OX) 400 (241.3 Mg) MG tablet Take 1 tablet (400 mg total) by mouth daily. 08/24/20   [name], MD  ondansetron (ZOFRAN) 8 MG tablet TAKE 1 TABLET BY MOUTH EVERY 8 HOURS AS NEEDED. START ON THE 3RD DAY AFTER CISPLATIN CHEMOTHERAPY Patient not taking: Reported on 08/19/2020 04/19/20  04/19/21  [name], MD  pantoprazole (PROTONIX) 40 MG tablet Take 1 tablet (40 mg total) by mouth 2 (two) times daily. 04/06/20   Thornton Park, MD  predniSONE (DELTASONE) 20 MG tablet Take 2 tablets (40 mg total) by mouth daily with breakfast. 10/04/20   [name], MD  prochlorperazine (COMPAZINE) 10 MG tablet TAKE 1 TABLET BY MOUTH EVERY 6 HOURS AS NEEDED FOR NAUSEA AND/OR VOMITING 10/04/20 10/04/21  [name], MD  traMADol (ULTRAM) 50 MG tablet TAKE 2 TABLETS BY MOUTH EVERY 6 HOURS AS NEEDED. 05/12/20 11/08/20  [name], MD    Physical Exam: Vitals:   10/08/20 0001 10/08/20 0030 10/08/20 0100 10/08/20 0538  BP: 135/73 135/81 128/69 125/80  Pulse: 93 100 99 (!) 101  Resp: 15  16 12   Temp: 98.3 F (36.8 C)     TempSrc: Oral     SpO2: 98% 98% 96% 100%      Constitutional: NAD, calm, comfortable Vitals:   10/08/20 0001 10/08/20 0030 10/08/20 0100 10/08/20 0538  BP: 135/73 135/81 128/69 125/80  Pulse: 93 100 99 (!) 101  Resp: 15  16 12   Temp: 98.3 F (36.8 C)     TempSrc: Oral     SpO2: 98% 98% 96% 100%   Eyes:  PERRL, lids and conjunctivae normal ENMT: Mucous membranes are moist. Posterior pharynx clear of any exudate or lesions.Normal dentition.  Neck: normal, supple, no masses, no thyromegaly Respiratory: clear to auscultation bilaterally, no wheezing, no crackles. Normal respiratory effort. No accessory muscle use.  Cardiovascular: Regular rate and rhythm, no murmurs / rubs / gallops. No extremity edema. 2+ pedal pulses. No carotid bruits.  Abdomen: no tenderness, no masses palpated. No hepatosplenomegaly. Bowel sounds positive.  Musculoskeletal: no clubbing / cyanosis. No joint deformity upper and lower extremities. Good ROM, no contractures. Normal muscle tone.  Skin: no rashes, lesions, ulcers. No induration Neurologic: CN 2-12 grossly intact. Sensation intact, DTR normal. Strength 5/5 in all 4.  Psychiatric: Normal judgment and insight. Alert and oriented x 3. Normal mood.    Labs on Admission: I have personally reviewed following labs and imaging studies  CBC: Recent Labs  Lab 10/01/20 1429 10/04/20 0957 10/08/20 0040 10/08/20 0655  WBC 5.8 6.3 8.7 6.7  NEUTROABS 4.7 5.7 7.3  --   HGB 8.0* 8.1* 8.5* 9.4*  HCT 24.7* 25.1* 25.6* 28.8*  MCV 91.1 91.3 91.1 93.5  PLT 90* 106* 121* 89*   Basic Metabolic Panel: Recent Labs  Lab 10/01/20 1429 10/04/20 0957 10/08/20 0040 10/08/20 0225 10/08/20 0655  NA 140  --  139  --  140  K 2.8* 2.4* 2.4*  --  2.4*  CL 99  --  104  --  108  CO2 28  --  24  --  22  GLUCOSE 125*  --  166*  --  141*  BUN 13  --  24*  --  19  CREATININE 1.38*  --  1.16*  --  1.12*  CALCIUM 9.3  --  9.1  --  7.8*  MG 1.7  --   --  1.5*  --    GFR: Estimated Creatinine Clearance: 57.8 mL/min (A) (by C-G formula based on SCr of 1.12 mg/dL (H)). Liver Function Tests: Recent Labs  Lab 10/01/20 1429  AST 10*  ALT 8  ALKPHOS 72  BILITOT 0.4  PROT 7.2  ALBUMIN 3.0*   No results for input(s): LIPASE, AMYLASE in the last 168 hours. No results for input(s):  AMMONIA in the last 168 hours. Coagulation Profile: Recent Labs  Lab 10/08/20 0225  INR 1.0   Cardiac Enzymes: No results for input(s): CKTOTAL, CKMB, CKMBINDEX, TROPONINI in the last 168 hours. BNP (last 3 results) No results for input(s): PROBNP in the last 8760 hours. HbA1C: No results for input(s): HGBA1C in the last 72 hours. CBG: No results for input(s): GLUCAP in the last 168 hours. Lipid Profile: No results for input(s): CHOL, HDL, LDLCALC, TRIG, CHOLHDL, LDLDIRECT in the last 72 hours. Thyroid Function Tests: No results for input(s): TSH, T4TOTAL, FREET4, T3FREE, THYROIDAB in the last 72 hours. Anemia Panel: No results for input(s): VITAMINB12, FOLATE, FERRITIN, TIBC, IRON, RETICCTPCT in the last 72 hours. Urine analysis:    Component Value Date/Time   PROTEINUR TRACE (A) 10/01/2020 1431   Sepsis Labs: !!!!!!!!!!!!!!!!!!!!!!!!!!!!!!!!!!!!!!!!!!!! @LABRCNTIP (procalcitonin:4,lacticidven:4) ) Recent Results (from the past 240 hour(s))  Resp Panel by RT-PCR (Flu A&B, Covid) Nasopharyngeal Swab     Status: None   Collection Time: 10/08/20  2:32 AM   Specimen: Nasopharyngeal Swab; Nasopharyngeal(NP) swabs in vial transport medium  Result Value Ref Range Status   SARS Coronavirus 2 by RT PCR NEGATIVE NEGATIVE Final    Comment: (NOTE) SARS-CoV-2 target nucleic acids are NOT DETECTED.  The SARS-CoV-2 RNA is generally detectable in upper respiratory specimens during the acute phase of infection. The lowest concentration of SARS-CoV-2 viral copies this assay can detect is 138 copies/mL. A negative result does not preclude SARS-Cov-2 infection and should not be used as the sole basis for treatment or other patient management decisions. A negative result may occur with  improper specimen collection/handling, submission of specimen other than nasopharyngeal swab, presence of viral mutation(s) within the areas targeted by this assay, and inadequate number of viral copies(<138  copies/mL). A negative result must be combined with clinical observations, patient history, and epidemiological information. The expected result is Negative.  Fact Sheet for Patients:  EntrepreneurPulse.com.au  Fact Sheet for Healthcare Providers:  IncredibleEmployment.be  This test is no t yet approved or cleared by the [place] FDA and  has been authorized for detection and/or diagnosis of SARS-CoV-2 by FDA under an Emergency Use Authorization (EUA). This EUA will remain  in effect (meaning this test can be used) for the duration of the COVID-19 declaration under Section 564(b)(1) of the Act, 21 U.S.C.section 360bbb-3(b)(1), unless the authorization is terminated  or revoked sooner.       Influenza A by PCR NEGATIVE NEGATIVE Final   Influenza B by PCR NEGATIVE NEGATIVE Final    Comment: (NOTE) The Xpert Xpress SARS-CoV-2/FLU/RSV plus assay is intended as an aid in the diagnosis of influenza from Nasopharyngeal swab specimens and should not be used as a sole basis for treatment. Nasal washings and aspirates are unacceptable for Xpert Xpress SARS-CoV-2/FLU/RSV testing.  Fact Sheet for Patients: EntrepreneurPulse.com.au  Fact Sheet for Healthcare Providers: IncredibleEmployment.be  This test is not yet approved or cleared by the [place] FDA and has been authorized for detection and/or diagnosis of SARS-CoV-2 by FDA under an Emergency Use Authorization (EUA). This EUA will remain in effect (meaning this test can be used) for the duration of the COVID-19 declaration under Section 564(b)(1) of the Act, 21 U.S.C. section 360bbb-3(b)(1), unless the authorization is terminated or revoked.  Performed at [facility], [place] [address] [place], [place] [address]      Radiological Exams on Admission: No results found.   Old chart reviewed  Assessment/Plan  65 year old female  with stage  IV cervical cancer undergoing chemotherapy comes in with vaginal bleeding found to have hypokalemia and hypomagnesemia  Principal Problem:    Vaginal bleeding-surprise she has not had abnormal vaginal bleeding since her diagnosis.  Hemoglobin is stable.  Vitals are also stable.  Bleeding has seemed to have stopped.  Monitor at this time.  Oncology team has been consulted.  Active Problems:    Cervical cancer, FIGO stage IVB (HCC)-oncology team has been consulted    Cutaneous lupus erythematosus-noted    Essential hypertension-clarify and resume home meds    Hypomagnesemia-replete per protocol.  Given mag sulfate 2 g IV.    Hypokalemia-replete per protocol.  Given 40 mill equivalents of KCl IV continue to supplement until normalizes.  Replete mag.    Further recommendations pending on overall hospital course   DVT prophylaxis: SCDs Code Status: Partial Family Communication: Husband at bedside Disposition Plan: 1 to 2 days Consults called: Oncology Admission status: Admission   Rafferty Postlewait A MD [facility]  If 7PM-7AM, please contact night-coverage [email or web address] Password [facility]  10/08/2020, 7:55 AM

## 2020-10-08 NOTE — ED Notes (Signed)
Bedding, brief, and pads changed. Pt changed into hospital gown. Bleeding with large clots cont.

## 2020-10-08 NOTE — Progress Notes (Signed)
Pt with increased rectal and lower abd pain not fully relieved by pain med. Urine output undetermined prior to arrival and none noted so far in shift. Bladder scan performed. Yielded 705 ml of urnie in bladder. MD R. David notified. New order given for foley cath and urinalysis. Foley inserted. 800 ml of pale to yellow-colored urine obtained. Pt tolerated insertion with no problems. Reported some relief after insertion. Family at bedside. Education provided to both patient and family. Both parties voiced understanding.  VWilliams,RN.

## 2020-10-08 NOTE — ED Provider Notes (Signed)
[place] DEPT Provider Note   CSN: [id number] Arrival date & time: 10/07/20  2343     History No chief complaint on file.   Phyllis Hebert is a 65 y.o. female.  The history is provided by the patient.  Vaginal Bleeding Quality:  Clots (and a gush of blood at home ) Severity:  Severe Onset quality:  Sudden Timing:  Constant Progression:  Resolved Chronicity:  New Menstrual history:  Postmenopausal (state 4 cervical cancer ) Possible pregnancy: no   Context: spontaneously   Relieved by:  Nothing Worsened by:  Nothing Ineffective treatments:  None tried Associated symptoms: nausea   Associated symptoms: no dizziness, no fever and no vaginal discharge   Risk factors: no PID        Past Medical History:  Diagnosis Date  . Anemia   . Anxiety   . Arthritis   . Cervical cancer, FIGO stage IVB [facility]) oncologist--- dr gorsuch/ dr Sondra Come   dx inital dx 09/ 2021, w/ mets to bone, started chemo 04-30-2020 and stopped 05-21-2020 due to worsening pancytopenia;  completed IMRT 06-16-2020 and scheduled to start brachytherapy boost high dose 06-21-2020  . Chemotherapy-induced nausea   . Cutaneous lupus erythematosus    followed by [facility]-- Marella Chimes PA  . Dyslipidemia   . Family history of breast cancer   . Family history of uterine cancer   . GERD (gastroesophageal reflux disease)   . Hemorrhoids   . History of 2019 novel coronavirus disease (COVID-19) 06/20/2019   [facility] , had mild symptoms that resolved  . History of colon polyps 04/06/2020   hyerplastic  . History of external beam radiation therapy 05-03-2020  to 06-16-2020   cervical cancer  . History of gastric polyp 04/06/2020   hyperplastic  . History of gastric ulcer   . History of radiation therapy 05/03/20-06/16/20 and 06/21/20-07/19/20   Cervical IMRT and Cervical HDR [facility]: Dr Gery Pray  . Pancytopenia due to chemotherapy ([place])   . Wears  glasses     Patient Active Problem List   Diagnosis Date Noted  . Vaginal bleeding 10/08/2020  . Hypokalemia 10/04/2020  . Deficiency anemia 10/04/2020  . Bilateral hydronephrosis 10/04/2020  . Drug-induced hyperglycemia 09/07/2020  . Hypomagnesemia 08/24/2020  . GERD (gastroesophageal reflux disease)   . Acquired hypothyroidism 07/13/2020  . Genetic testing 06/21/2020  . Family history of breast cancer   . Family history of uterine cancer   . Pancytopenia, acquired ([place]) 05/26/2020  . Nausea without vomiting 05/20/2020  . Pain in joint, pelvic region and thigh 05/12/2020  . Anxiety disorder due to medical condition 05/05/2020  . Essential hypertension 05/05/2020  . Family history of cancer 04/29/2020  . Goals of care, counseling/discussion 04/29/2020  . SLE (systemic lupus erythematosus) ([place]) 04/20/2020  . Hydronephrosis of left kidney 04/20/2020  . Preventive measure 04/20/2020  . Metastasis to bone ([place]) 04/19/2020  . Cervical cancer, FIGO stage IVB ([place]) 04/01/2020  . COVID-19 virus infection 06/26/2019  . Obesity (BMI 30-39.9) 08/07/2018  . Hypertriglyceridemia 07/02/2017  . Vitamin D deficiency 07/02/2017  . Physical exam 07/02/2017  . 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 CATARACT EXTRACTION W/ INTRAOCULAR LENS IMPLANT Left 12/2019  . COLONOSCOPY WITH ESOPHAGOGASTRODUODENOSCOPY (EGD)  04-06-2020  dr Raliegh Ip. beavers  . ENDOMETRIAL ABLATION W/ NOVASURE  2010  . IR IMAGING GUIDED PORT INSERTION  04/26/2020  . OPERATIVE ULTRASOUND N/A  06/21/2020   Procedure: OPERATIVE ULTRASOUND;  Surgeon: Gery Pray, MD;  Location: [facility];  Service: Urology;  Laterality: N/A;  TECH  . OPERATIVE ULTRASOUND N/A 07/01/2020   Procedure: OPERATIVE ULTRASOUND;  Surgeon: Gery Pray, MD;  Location: [facility];  Service: Urology;  Laterality: N/A;  TECH  . OPERATIVE ULTRASOUND N/A 07/06/2020    Procedure: OPERATIVE ULTRASOUND;  Surgeon: Gery Pray, MD;  Location: [facility];  Service: Urology;  Laterality: N/A;  NEED TECH AND ULTRASOUND TO ARRIVE AT 8AM  . OPERATIVE ULTRASOUND N/A 07/14/2020   Procedure: OPERATIVE ULTRASOUND;  Surgeon: Gery Pray, MD;  Location: [facility];  Service: Urology;  Laterality: N/A;  . OPERATIVE ULTRASOUND N/A 07/19/2020   Procedure: OPERATIVE ULTRASOUND;  Surgeon: Gery Pray, MD;  Location: [facility];  Service: Urology;  Laterality: N/A;  . TANDEM RING INSERTION N/A 06/21/2020   Procedure: TANDEM RING INSERTION;  Surgeon: Gery Pray, MD;  Location: [facility];  Service: Urology;  Laterality: N/A;  . TANDEM RING INSERTION N/A 07/01/2020   Procedure: TANDEM RING INSERTION;  Surgeon: Gery Pray, MD;  Location: [facility];  Service: Urology;  Laterality: N/A;  . TANDEM RING INSERTION N/A 07/06/2020   Procedure: TANDEM RING INSERTION;  Surgeon: Gery Pray, MD;  Location: [facility];  Service: Urology;  Laterality: N/A;  . TANDEM RING INSERTION N/A 07/14/2020   Procedure: TANDEM RING INSERTION;  Surgeon: Gery Pray, MD;  Location: [facility];  Service: Urology;  Laterality: N/A;  . TANDEM RING INSERTION N/A 07/19/2020   Procedure: TANDEM RING INSERTION;  Surgeon: Gery Pray, MD;  Location: [facility];  Service: Urology;  Laterality: N/A;     OB History   No obstetric history on file.     Family History  Problem Relation Age of Onset  . Hypertension Mother   . Emphysema Mother   . Uterine cancer Mother 11  . Hypertension Father   . Stroke Father   . Hypertension Brother   . Healthy Daughter   . Breast cancer Maternal Aunt        d. 70  . Leukemia Cousin        d. 44s    Social History   Tobacco Use  . Smoking status: Never Smoker  . Smokeless tobacco: Never Used  Vaping Use  . Vaping Use: Never used   Substance Use Topics  . Alcohol use: No  . Drug use: Never    Home Medications Prior to Admission medications   Medication Sig Start Date End Date Taking? Authorizing Provider  calcium carbonate (TUMS - DOSED IN MG ELEMENTAL CALCIUM) 500 MG chewable tablet Chew 1 tablet by mouth 2 (two) times daily.    [provider]  Cholecalciferol (VITAMIN D3) 2000 units TABS Take 2 tablets by mouth daily.    [provider]  clobetasol cream (TEMOVATE) 0.05 % Apply topically as needed. Left leg Patient not taking: Reported on 08/19/2020 10/17/19   [provider]  dexamethasone (DECADRON) 4 MG tablet Take 4 mg by mouth as directed.    [provider]  fenofibrate 160 MG tablet TAKE 1 TABLET BY MOUTH EVERY DAY Patient taking differently: Take 160 mg by mouth daily with lunch. 02/25/20   [name], MD  lidocaine-prilocaine (EMLA) cream APPLY TO THE AFFECTED AREA(S) ONCE AS DIRECTED Patient not taking: Reported on 08/19/2020 04/19/20 04/19/21  [name], MD  LORazepam (  ATIVAN) 0.5 MG tablet Take 1 tablet (0.5 mg total) by mouth every 8 (eight) hours as needed for anxiety. 05/05/20   [name], MD  magnesium oxide (MAG-OX) 400 (241.3 Mg) MG tablet Take 1 tablet (400 mg total) by mouth daily. 08/24/20   [name], MD  ondansetron (ZOFRAN) 8 MG tablet TAKE 1 TABLET BY MOUTH EVERY 8 HOURS AS NEEDED. START ON THE 3RD DAY AFTER CISPLATIN CHEMOTHERAPY Patient not taking: Reported on 08/19/2020 04/19/20 04/19/21  [name], MD  pantoprazole (PROTONIX) 40 MG tablet Take 1 tablet (40 mg total) by mouth 2 (two) times daily. 04/06/20   Thornton Park, MD  predniSONE (DELTASONE) 20 MG tablet Take 2 tablets (40 mg total) by mouth daily with breakfast. 10/04/20   [name], MD  prochlorperazine (COMPAZINE) 10 MG tablet TAKE 1 TABLET BY MOUTH EVERY 6 HOURS AS NEEDED FOR NAUSEA AND/OR VOMITING 10/04/20 10/04/21  [name], MD  traMADol (ULTRAM) 50 MG tablet TAKE 2 TABLETS BY  MOUTH EVERY 6 HOURS AS NEEDED. 05/12/20 11/08/20  [name], MD    Allergies    Penicillins  Review of Systems   Review of Systems  Constitutional: Negative for fever.  HENT: Negative for congestion.   Eyes: Negative for visual disturbance.  Respiratory: Negative for shortness of breath.   Cardiovascular: Negative for chest pain.  Gastrointestinal: Positive for nausea.  Genitourinary: Positive for vaginal bleeding. Negative for vaginal discharge.  Musculoskeletal: Negative for arthralgias.  Skin: Negative for rash.  Neurological: Negative for dizziness.  Psychiatric/Behavioral: Negative for agitation.  All other systems reviewed and are negative.   Physical Exam Updated Vital Signs BP 128/69 (BP Location: Right Arm)   Pulse 99   Temp 98.3 F (36.8 C) (Oral)   Resp 16   SpO2 96%   Physical Exam Vitals and nursing note reviewed. Exam conducted with a chaperone present [facility] for multiple exams and packing ).  Constitutional:      Appearance: Normal appearance. She is not diaphoretic.  HENT:     Head: Normocephalic and atraumatic.     Nose: Nose normal.  Eyes:     Conjunctiva/sclera: Conjunctivae normal.     Pupils: Pupils are equal, round, and reactive to light.  Cardiovascular:     Rate and Rhythm: Normal rate and regular rhythm.     Pulses: Normal pulses.     Heart sounds: Normal heart sounds.  Pulmonary:     Effort: Pulmonary effort is normal.     Breath sounds: Normal breath sounds.  Abdominal:     General: Abdomen is flat. Bowel sounds are normal.     Palpations: Abdomen is soft.     Tenderness: There is no abdominal tenderness. There is no guarding.  Genitourinary:    Comments: Clots but no active bleeding on 3 exams packed with kerlex per discussion with GYN Musculoskeletal:     Cervical back: Normal range of motion and neck supple.  Skin:    General: Skin is warm and dry.     Capillary Refill: Capillary refill takes less than 2 seconds.  Neurological:      General: No focal deficit present.     Mental Status: She is alert.     Deep Tendon Reflexes: Reflexes normal.  Psychiatric:        Mood and Affect: Mood normal.        Behavior: Behavior normal.     ED Results / Procedures / Treatments   Labs (all labs ordered are listed, but only  abnormal results are displayed) Results for orders placed or performed during the hospital encounter of 10/07/20  CBC with Differential/Platelet  Result Value Ref Range   WBC 8.7 4.0 - 10.5 K/uL   RBC 2.81 (L) 3.87 - 5.11 MIL/uL   Hemoglobin 8.5 (L) 12.0 - 15.0 g/dL   HCT 25.6 (L) 36.0 - 46.0 %   MCV 91.1 80.0 - 100.0 fL   MCH 30.2 26.0 - 34.0 pg   MCHC 33.2 30.0 - 36.0 g/dL   RDW 15.9 (H) 11.5 - 15.5 %   Platelets 121 (L) 150 - 400 K/uL   nRBC 0.7 (H) 0.0 - 0.2 %   Neutrophils Relative % 83 %   Neutro Abs 7.3 1.7 - 7.7 K/uL   Lymphocytes Relative 7 %   Lymphs Abs 0.6 (L) 0.7 - 4.0 K/uL   Monocytes Relative 8 %   Monocytes Absolute 0.7 0.1 - 1.0 K/uL   Eosinophils Relative 0 %   Eosinophils Absolute 0.0 0.0 - 0.5 K/uL   Basophils Relative 0 %   Basophils Absolute 0.0 0.0 - 0.1 K/uL   Immature Granulocytes 2 %   Abs Immature Granulocytes 0.14 (H) 0.00 - 0.07 K/uL  Basic metabolic panel  Result Value Ref Range   Sodium 139 135 - 145 mmol/L   Potassium 2.4 (LL) 3.5 - 5.1 mmol/L   Chloride 104 98 - 111 mmol/L   CO2 24 22 - 32 mmol/L   Glucose, Bld 166 (H) 70 - 99 mg/dL   BUN 24 (H) 8 - 23 mg/dL   Creatinine, Ser 1.16 (H) 0.44 - 1.00 mg/dL   Calcium 9.1 8.9 - 10.3 mg/dL   GFR, Estimated 53 (L) >60 mL/min   Anion gap 11 5 - 15  Protime-INR  Result Value Ref Range   Prothrombin Time 13.0 11.4 - 15.2 seconds   INR 1.0 0.8 - 1.2  Magnesium  Result Value Ref Range   Magnesium 1.5 (L) 1.7 - 2.4 mg/dL   CT CHEST ABDOMEN PELVIS W CONTRAST  Result Date: 10/03/2020 CLINICAL DATA:  Metastatic cervical cancer restaging EXAM: CT CHEST, ABDOMEN, AND PELVIS WITH CONTRAST TECHNIQUE: Multidetector  CT imaging of the chest, abdomen and pelvis was performed following the standard protocol during bolus administration of intravenous contrast. CONTRAST:  62mL ISOVUE-300 IOPAMIDOL (ISOVUE-300) INJECTION 61%, additional oral enteric contrast COMPARISON:  PET-CT, 07/27/2020, PET-CT, 04/19/2020 FINDINGS: CT CHEST FINDINGS Cardiovascular: Right chest port catheter. Normal heart size. No pericardial effusion. Mediastinum/Nodes: No enlarged mediastinal, hilar, or axillary lymph nodes. Thyroid gland, trachea, and esophagus demonstrate no significant findings. Lungs/Pleura: There are multiple bilateral pulmonary nodules, generally subsolid in composition. The majority of nodules are diminished in size, for example a nodule of the medial posterior right upper lobe which has been reduced to an elongated residual measuring approximately 0.9 x 0.3 cm, previously 0.9 x 0.6 cm (series 4, image 45). A small nodule of the central left upper lobe previously seen is almost entirely resolved, measuring no greater than 1-2 mm (series 4, image 60). A nodule of the dependent superior segment left lower lobe however appears to be slightly increased in size, measuring 1.2 x 0.9 cm, previously 0.9 x 0.9 cm when measured similarly (series 4, image 47). No pleural effusion or pneumothorax. Musculoskeletal: No chest wall mass or suspicious bone lesions identified. CT ABDOMEN PELVIS FINDINGS Hepatobiliary: No solid liver abnormality is seen. No gallstones, gallbladder wall thickening, or biliary dilatation. Pancreas: Unremarkable. No pancreatic ductal dilatation or surrounding inflammatory changes. Spleen: Normal in  size without significant abnormality. Adrenals/Urinary Tract: Adrenal glands are unremarkable. There is redemonstrated severe left hydronephrosis, worsened compared to prior examination, with relative atrophy and cortical hypoenhancement of the left kidney. There is new moderate right hydronephrosis and hydroureter. The distal  ureters are obstructed in the pelvis by soft tissue about the uterine cervix (series 2, image 110). Thickening of the urinary bladder wall. Stomach/Bowel: Stomach is within normal limits. Appendix appears normal. No evidence of bowel wall thickening, distention, or inflammatory changes. Vascular/Lymphatic: No significant vascular findings are present. No significant interval change in subcentimeter retroperitoneal lymph nodes, measuring up to 1.0 x 0.6 cm and previously FDG avid (series 2, image 76) Reproductive: Heterogeneous appearance and enhancement of the uterine fundus (series 8, image 112). Circumferential soft tissue thickening about the cervix and lower uterine segment, overall dimensions approximately 5.5 x 3.5 cm, not significantly changed (series 2, image 108). There is loss of fat plane to the adjacent posterior urinary bladder wall and thickening of the bladder wall. Perirectal and presacral soft tissue stranding, slightly increased compared to prior examination. Other: No abdominal wall hernia or abnormality. No abdominopelvic ascites. Musculoskeletal: Redemonstrated sclerotic osseous lesion of the right ischial tuberosity (series 2, image 119). IMPRESSION: 1. There are multiple bilateral pulmonary nodules, generally subsolid in composition. The majority of nodules are diminished in size, or even completely resolved. A nodule of the dependent superior segment left lower lobe however appears to be slightly increased in size, measuring 1.2 x 0.9 cm, previously 0.9 x 0.9 cm when measured similarly. Findings are generally consistent with treatment response of pulmonary metastatic disease, however with some evidence of mixed response. 2. No significant interval change in previously FDG avid subcentimeter retroperitoneal lymph nodes, consistent with nodal metastatic disease. 3. Redemonstrated sclerotic osseous lesion of the right ischial tuberosity, not significant changed. No new osseous lesions identified  by CT. 4. Circumferential soft tissue thickening about the cervix and lower uterine segment, overall dimensions approximately 5.5 x 3.5 cm, not significantly changed, consistent with primary cervical malignancy. There is loss of fat plane to the adjacent posterior urinary bladder wall and thickening of the bladder wall, suggesting direct invasion. 5. Perirectal and presacral soft tissue stranding, slightly increased compared to prior examination and consistent with developing radiation change. 6. Worsened, severe left hydronephrosis and new, moderate right hydronephrosis secondary to obstruction by cervical mass. Electronically Signed   By: Eddie Candle M.D.   On: 10/03/2020 18:27    Radiology No results found.  Procedures Vaginal packing  Date/Time: 10/08/2020 3:22 AM Performed by: Veatrice Kells, MD Authorized by: Veatrice Kells, MD  Consent: Verbal consent obtained. Risks and benefits: risks, benefits and alternatives were discussed Consent given by: patient Patient identity confirmed: arm band Local anesthesia used: no  Anesthesia: Local anesthesia used: no  Sedation: Patient sedated: no  Patient tolerance: patient tolerated the procedure well with no immediate complications Comments: kerlex       Medications Ordered in ED Medications  potassium chloride 10 mEq in 100 mL IVPB (10 mEq Intravenous New Bag/Given 10/08/20 0255)  acetaminophen (TYLENOL) tablet 650 mg (has no administration in time range)    Or  acetaminophen (TYLENOL) suppository 650 mg (has no administration in time range)  lactated ringers infusion (has no administration in time range)  magnesium sulfate IVPB 2 g 50 mL (has no administration in time range)  tranexamic acid (CYKLOKAPRON) IVPB 1,000 mg (0 mg Intravenous Stopped 10/08/20 0245)    ED Course  I have reviewed the triage vital  signs and the nursing notes.  Pertinent labs & imaging results that were available during my care of the patient were  reviewed by me and considered in my medical decision making (see chart for details).    Case d/w Dr. Jana Hakim who will consult on patient 89 Case d/w Dr. Kennon Rounds, aside from packing and TXA there is nothing additional GYN has to offer  Final Clinical Impression(s) / ED Diagnoses Final diagnoses:  Vaginal bleeding  Hypokalemia    Admit to medicine    Opie Maclaughlin, MD 10/08/20 2233

## 2020-10-09 DIAGNOSIS — I10 Essential (primary) hypertension: Secondary | ICD-10-CM

## 2020-10-09 DIAGNOSIS — L932 Other local lupus erythematosus: Secondary | ICD-10-CM

## 2020-10-09 LAB — CBC
HCT: 11.5 % — ABNORMAL LOW (ref 36.0–46.0)
Hemoglobin: 3.8 g/dL — CL (ref 12.0–15.0)
MCH: 31.4 pg (ref 26.0–34.0)
MCHC: 33 g/dL (ref 30.0–36.0)
MCV: 95 fL (ref 80.0–100.0)
Platelets: 52 10*3/uL — ABNORMAL LOW (ref 150–400)
RBC: 1.21 MIL/uL — ABNORMAL LOW (ref 3.87–5.11)
RDW: 17.9 % — ABNORMAL HIGH (ref 11.5–15.5)
WBC: 8.5 10*3/uL (ref 4.0–10.5)
nRBC: 0.5 % — ABNORMAL HIGH (ref 0.0–0.2)

## 2020-10-09 LAB — COMPREHENSIVE METABOLIC PANEL
ALT: 10 U/L (ref 0–44)
AST: 13 U/L — ABNORMAL LOW (ref 15–41)
Albumin: 2.2 g/dL — ABNORMAL LOW (ref 3.5–5.0)
Alkaline Phosphatase: 43 U/L (ref 38–126)
Anion gap: 6 (ref 5–15)
BUN: 23 mg/dL (ref 8–23)
CO2: 25 mmol/L (ref 22–32)
Calcium: 7.6 mg/dL — ABNORMAL LOW (ref 8.9–10.3)
Chloride: 106 mmol/L (ref 98–111)
Creatinine, Ser: 1.35 mg/dL — ABNORMAL HIGH (ref 0.44–1.00)
GFR, Estimated: 44 mL/min — ABNORMAL LOW (ref >60)
Glucose, Bld: 133 mg/dL — ABNORMAL HIGH (ref 70–99)
Potassium: 3.1 mmol/L — ABNORMAL LOW (ref 3.5–5.1)
Sodium: 137 mmol/L (ref 135–145)
Total Bilirubin: 0.4 mg/dL (ref 0.3–1.2)
Total Protein: 4.6 g/dL — ABNORMAL LOW (ref 6.5–8.1)

## 2020-10-09 LAB — PREPARE RBC (CROSSMATCH)

## 2020-10-09 MED ORDER — POTASSIUM CHLORIDE CRYS ER 20 MEQ PO TBCR
40.0000 meq | EXTENDED_RELEASE_TABLET | Freq: Two times a day (BID) | ORAL | Status: AC
  Administered 2020-10-09: 40 meq via ORAL
  Filled 2020-10-09: qty 2

## 2020-10-09 MED ORDER — SODIUM CHLORIDE 0.9% IV SOLUTION: Freq: Once | INTRAVENOUS | Status: AC

## 2020-10-09 MED ORDER — PROCHLORPERAZINE EDISYLATE 10 MG/2ML IJ SOLN: 10.0000 mg | INTRAMUSCULAR | Status: DC | PRN

## 2020-10-09 MED ORDER — HYDROMORPHONE HCL 1 MG/ML IJ SOLN
0.5000 mg | INTRAMUSCULAR | Status: AC | PRN
  Administered 2020-10-09 – 2020-10-10 (×3): 0.5 mg via INTRAVENOUS
  Filled 2020-10-09 (×3): qty 0.5

## 2020-10-09 NOTE — Consult Note (Signed)
H&P Physician requesting consult: Derrill Kay  Chief Complaint: Bilateral hydronephrosis  History of Present Illness: 65 year old female with a history of stage IV cervical cancer currently undergoing chemotherapy presented with vaginal bleeding.  She has also been dealing with hypokalemia.  She is admitted with severe fatigue and electrolyte abnormalities.  She has also had some failure to thrive.  She has a history of pancytopenia associated with her chemotherapy.  Most recent hemoglobin was 3.8.She did recently undergo repeat cervical cancer restaging with a CT of the chest abdomen and pelvis on 10/01/2020.  From a genitourinary standpoint, this revealed worsened severe left hydronephrosis as well as new moderate right-sided hydronephrosis secondary to ureteral obstruction by the cervical mass.  She has been having some pelvic pain and discomfort and having to strain to void.  She currently has a Foley catheter in place because of difficulty with ambulation upon admission.  Creatinine is stable at 1.35.  Past Medical History:  Diagnosis Date  . Anemia   . Anxiety   . Arthritis   . Cervical cancer, FIGO stage IVB [facility]) oncologist--- dr gorsuch/ dr Sondra Come   dx inital dx 09/ 2021, w/ mets to bone, started chemo 04-30-2020 and stopped 05-21-2020 due to worsening pancytopenia;  completed IMRT 06-16-2020 and scheduled to start brachytherapy boost high dose 06-21-2020  . Chemotherapy-induced nausea   . Cutaneous lupus erythematosus    followed by [facility]-- Marella Chimes PA  . Dyslipidemia   . Family history of breast cancer   . Family history of uterine cancer   . GERD (gastroesophageal reflux disease)   . Hemorrhoids   . History of 2019 novel coronavirus disease (COVID-19) 06/20/2019   [facility] , had mild symptoms that resolved  . History of colon polyps 04/06/2020   hyerplastic  . History of external beam radiation therapy 05-03-2020  to 06-16-2020   cervical cancer  .  History of gastric polyp 04/06/2020   hyperplastic  . History of gastric ulcer   . History of radiation therapy 05/03/20-06/16/20 and 06/21/20-07/19/20   Cervical IMRT and Cervical HDR Tandem & Ring: Dr Gery Pray  . Pancytopenia due to chemotherapy ([place])   . Wears glasses    Past Surgical History:  Procedure Laterality Date  . CATARACT EXTRACTION W/ INTRAOCULAR LENS IMPLANT Left 12/2019  . COLONOSCOPY WITH ESOPHAGOGASTRODUODENOSCOPY (EGD)  04-06-2020  dr Raliegh Ip. [place]  . ENDOMETRIAL ABLATION W/ NOVASURE  2010  . IR IMAGING GUIDED PORT INSERTION  04/26/2020  . OPERATIVE ULTRASOUND N/A 06/21/2020   Procedure: OPERATIVE ULTRASOUND;  Surgeon: Gery Pray, MD;  Location: [facility];  Service: Urology;  Laterality: N/A;  TECH  . OPERATIVE ULTRASOUND N/A 07/01/2020   Procedure: OPERATIVE ULTRASOUND;  Surgeon: Gery Pray, MD;  Location: [facility];  Service: Urology;  Laterality: N/A;  TECH  . OPERATIVE ULTRASOUND N/A 07/06/2020   Procedure: OPERATIVE ULTRASOUND;  Surgeon: Gery Pray, MD;  Location: [facility];  Service: Urology;  Laterality: N/A;  NEED TECH AND ULTRASOUND TO ARRIVE AT 8AM  . OPERATIVE ULTRASOUND N/A 07/14/2020   Procedure: OPERATIVE ULTRASOUND;  Surgeon: Gery Pray, MD;  Location: [facility];  Service: Urology;  Laterality: N/A;  . OPERATIVE ULTRASOUND N/A 07/19/2020   Procedure: OPERATIVE ULTRASOUND;  Surgeon: Gery Pray, MD;  Location: [facility];  Service: Urology;  Laterality: N/A;  . TANDEM RING INSERTION N/A 06/21/2020   Procedure: TANDEM RING INSERTION;  Surgeon: Gery Pray, MD;  Location:  [place];  Service: Urology;  Laterality: N/A;  . TANDEM RING INSERTION N/A 07/01/2020   Procedure: TANDEM RING INSERTION;  Surgeon: Gery Pray, MD;  Location: [facility];  Service: Urology;  Laterality: N/A;  . TANDEM RING INSERTION N/A 07/06/2020   Procedure:  TANDEM RING INSERTION;  Surgeon: Gery Pray, MD;  Location: [facility];  Service: Urology;  Laterality: N/A;  . TANDEM RING INSERTION N/A 07/14/2020   Procedure: TANDEM RING INSERTION;  Surgeon: Gery Pray, MD;  Location: [facility];  Service: Urology;  Laterality: N/A;  . TANDEM RING INSERTION N/A 07/19/2020   Procedure: TANDEM RING INSERTION;  Surgeon: Gery Pray, MD;  Location: [facility];  Service: Urology;  Laterality: N/A;    Home Medications:  Medications Prior to Admission  Medication Sig Dispense Refill Last Dose  . calcium carbonate (TUMS - DOSED IN MG ELEMENTAL CALCIUM) 500 MG chewable tablet Chew 1 tablet by mouth 2 (two) times daily.   10/07/2020 at Unknown time  . Cholecalciferol (VITAMIN D3) 2000 units TABS Take 4,000 Units by mouth daily.   10/07/2020 at Unknown time  . dexamethasone (DECADRON) 4 MG tablet Take 4 mg by mouth as directed.   Past Week at Unknown time  . fenofibrate 160 MG tablet TAKE 1 TABLET BY MOUTH EVERY DAY (Patient taking differently: Take 160 mg by mouth daily with lunch.) 30 tablet 5 10/07/2020 at Unknown time  . LORazepam (ATIVAN) 0.5 MG tablet Take 1 tablet (0.5 mg total) by mouth every 8 (eight) hours as needed for anxiety. 30 tablet 0 Past Week at Unknown time  . magnesium oxide (MAG-OX) 400 MG tablet Take 400 mg by mouth daily.   10/07/2020 at Unknown time  . pantoprazole (PROTONIX) 40 MG tablet Take 1 tablet (40 mg total) by mouth 2 (two) times daily. 90 tablet 3 10/07/2020 at Unknown time  . predniSONE (DELTASONE) 20 MG tablet Take 2 tablets (40 mg total) by mouth daily with breakfast. 60 tablet 1 10/07/2020 at Unknown time  . prochlorperazine (COMPAZINE) 10 MG tablet TAKE 1 TABLET BY MOUTH EVERY 6 HOURS AS NEEDED FOR NAUSEA AND/OR VOMITING (Patient taking differently: Take 10 mg by mouth every 6 (six) hours as needed.) 90 tablet 1 Past Week at Unknown time  . traMADol (ULTRAM) 50 MG tablet TAKE 2 TABLETS  BY MOUTH EVERY 6 HOURS AS NEEDED. (Patient taking differently: Take 25 mg by mouth every 6 (six) hours as needed for moderate pain.) 60 tablet 0 10/07/2020 at Unknown time  . lidocaine-prilocaine (EMLA) cream APPLY TO THE AFFECTED AREA(S) ONCE AS DIRECTED (Patient not taking: No sig reported) 30 g 3 Not Taking at Unknown time  . magnesium oxide (MAG-OX) 400 (241.3 Mg) MG tablet Take 1 tablet (400 mg total) by mouth daily. (Patient not taking: Reported on 10/08/2020) 30 tablet 1 Not Taking at Unknown time  . ondansetron (ZOFRAN) 8 MG tablet TAKE 1 TABLET BY MOUTH EVERY 8 HOURS AS NEEDED. START ON THE 3RD DAY AFTER CISPLATIN CHEMOTHERAPY (Patient not taking: No sig reported) 30 tablet 1 Not Taking at Unknown time   Allergies:  Allergies  Allergen Reactions  . Penicillins Rash    Family History  Problem Relation Age of Onset  . Hypertension Mother   . Emphysema Mother   . Uterine cancer Mother 24  . Hypertension Father   . Stroke Father   . Hypertension Brother   . Healthy Daughter   . Breast cancer Maternal  Aunt        d. 66  . Leukemia Cousin        d. 87s   Social History:  reports that she has never smoked. She has never used smokeless tobacco. She reports that she does not drink alcohol and does not use drugs.  ROS: A complete review of systems was performed.  All systems are negative except for pertinent findings as noted. ROS   Physical Exam:  Vital signs in last 24 hours: Temp:  [98.6 F (37 C)-100.9 F (38.3 C)] 99.3 F (37.4 C) (04/23 1041) Pulse Rate:  [100-123] 100 (04/23 1041) Resp:  [16-20] 17 (04/23 1041) BP: (106-125)/(56-64) 113/64 (04/23 1041) SpO2:  [90 %-99 %] 92 % (04/23 1041) Weight:  [99.4 kg] 99.4 kg (04/23 0500) General:  Alert and oriented, No acute distress HEENT: Normocephalic, atraumatic Neck: No JVD or lymphadenopathy Cardiovascular: Regular rate and rhythm Lungs: Regular rate and effort Abdomen: Soft, nontender, nondistended, no abdominal  masses Back: No CVA tenderness Extremities: No edema Neurologic: Grossly intact Genitourinary: Foley catheter draining clear yellow urine  Laboratory Data:  Results for orders placed or performed during the hospital encounter of 10/07/20 (from the past 24 hour(s))  Urinalysis, Routine w reflex microscopic Urine, Catheterized     Status: Abnormal   Collection Time: 10/08/20  4:00 PM  Result Value Ref Range   Color, Urine YELLOW YELLOW   APPearance CLEAR CLEAR   Specific Gravity, Urine 1.012 1.005 - 1.030   pH 6.0 5.0 - 8.0   Glucose, UA NEGATIVE NEGATIVE mg/dL   Hgb urine dipstick SMALL (A) NEGATIVE   Bilirubin Urine NEGATIVE NEGATIVE   Ketones, ur NEGATIVE NEGATIVE mg/dL   Protein, ur NEGATIVE NEGATIVE mg/dL   Nitrite NEGATIVE NEGATIVE   Leukocytes,Ua NEGATIVE NEGATIVE   RBC / HPF 0-5 0 - 5 RBC/hpf   WBC, UA 0-5 0 - 5 WBC/hpf   Bacteria, UA RARE (A) NONE SEEN   Mucus PRESENT    Hyaline Casts, UA PRESENT   Basic metabolic panel     Status: Abnormal   Collection Time: 10/08/20  5:58 PM  Result Value Ref Range   Sodium 138 135 - 145 mmol/L   Potassium 3.6 3.5 - 5.1 mmol/L   Chloride 105 98 - 111 mmol/L   CO2 24 22 - 32 mmol/L   Glucose, Bld 133 (H) 70 - 99 mg/dL   BUN 25 (H) 8 - 23 mg/dL   Creatinine, Ser 1.38 (H) 0.44 - 1.00 mg/dL   Calcium 7.8 (L) 8.9 - 10.3 mg/dL   GFR, Estimated 43 (L) >60 mL/min   Anion gap 9 5 - 15  Comprehensive metabolic panel     Status: Abnormal   Collection Time: 10/09/20 12:24 PM  Result Value Ref Range   Sodium 137 135 - 145 mmol/L   Potassium 3.1 (L) 3.5 - 5.1 mmol/L   Chloride 106 98 - 111 mmol/L   CO2 25 22 - 32 mmol/L   Glucose, Bld 133 (H) 70 - 99 mg/dL   BUN 23 8 - 23 mg/dL   Creatinine, Ser 1.35 (H) 0.44 - 1.00 mg/dL   Calcium 7.6 (L) 8.9 - 10.3 mg/dL   Total Protein 4.6 (L) 6.5 - 8.1 g/dL   Albumin 2.2 (L) 3.5 - 5.0 g/dL   AST 13 (L) 15 - 41 U/L   ALT 10 0 - 44 U/L   Alkaline Phosphatase 43 38 - 126 U/L   Total Bilirubin 0.4  0.3 - 1.2  mg/dL   GFR, Estimated 44 (L) >60 mL/min   Anion gap 6 5 - 15  CBC     Status: Abnormal   Collection Time: 10/09/20 12:57 PM  Result Value Ref Range   WBC 8.5 4.0 - 10.5 K/uL   RBC 1.21 (L) 3.87 - 5.11 MIL/uL   Hemoglobin 3.8 (LL) 12.0 - 15.0 g/dL   HCT 11.5 (L) 36.0 - 46.0 %   MCV 95.0 80.0 - 100.0 fL   MCH 31.4 26.0 - 34.0 pg   MCHC 33.0 30.0 - 36.0 g/dL   RDW 17.9 (H) 11.5 - 15.5 %   Platelets 52 (L) 150 - 400 K/uL   nRBC 0.5 (H) 0.0 - 0.2 %   Recent Results (from the past 240 hour(s))  Resp Panel by RT-PCR (Flu A&B, Covid) Nasopharyngeal Swab     Status: None   Collection Time: 10/08/20  2:32 AM   Specimen: Nasopharyngeal Swab; Nasopharyngeal(NP) swabs in vial transport medium  Result Value Ref Range Status   SARS Coronavirus 2 by RT PCR NEGATIVE NEGATIVE Final    Comment: (NOTE) SARS-CoV-2 target nucleic acids are NOT DETECTED.  The SARS-CoV-2 RNA is generally detectable in upper respiratory specimens during the acute phase of infection. The lowest concentration of SARS-CoV-2 viral copies this assay can detect is 138 copies/mL. A negative result does not preclude SARS-Cov-2 infection and should not be used as the sole basis for treatment or other patient management decisions. A negative result may occur with  improper specimen collection/handling, submission of specimen other than nasopharyngeal swab, presence of viral mutation(s) within the areas targeted by this assay, and inadequate number of viral copies(<138 copies/mL). A negative result must be combined with clinical observations, patient history, and epidemiological information. The expected result is Negative.  Fact Sheet for Patients:  https://www.fda.gov/media/152166/download  Fact Sheet for Healthcare Providers:  https://www.fda.gov/media/152162/download  This test is no t yet approved or cleared by the [place] FDA and  has been authorized for detection and/or diagnosis of SARS-CoV-2  by FDA under an Emergency Use Authorization (EUA). This EUA will remain  in effect (meaning this test can be used) for the duration of the COVID-19 declaration under Section 564(b)(1) of the Act, 21 U.S.C.section 360bbb-3(b)(1), unless the authorization is terminated  or revoked sooner.       Influenza A by PCR NEGATIVE NEGATIVE Final   Influenza B by PCR NEGATIVE NEGATIVE Final    Comment: (NOTE) The Xpert Xpress SARS-CoV-2/FLU/RSV plus assay is intended as an aid in the diagnosis of influenza from Nasopharyngeal swab specimens and should not be used as a sole basis for treatment. Nasal washings and aspirates are unacceptable for Xpert Xpress SARS-CoV-2/FLU/RSV testing.  Fact Sheet for Patients: https://www.fda.gov/media/152166/download  Fact Sheet for Healthcare Providers: https://www.fda.gov/media/152162/download  This test is not yet approved or cleared by the [place] FDA and has been authorized for detection and/or diagnosis of SARS-CoV-2 by FDA under an Emergency Use Authorization (EUA). This EUA will remain in effect (meaning this test can be used) for the duration of the COVID-19 declaration under Section 564(b)(1) of the Act, 21 U.S.C. section 360bbb-3(b)(1), unless the authorization is terminated or revoked.  Performed at [facility], [address] [place], [place] [address]    Creatinine: Recent Labs    10/08/20 0040 10/08/20 0655 10/08/20 1758 10/09/20 1224  CREATININE 1.16* 1.12* 1.38* 1.35*    Impression/Assessment:  Bilateral hydronephrosis secondary to extrinsic compression of the ureters from cervical mass Acute renal insufficiency    Plan:  I discussed the options of bilateral ureteral stent placement as well as bilateral nephrostomy tube placement.  She would like to proceed with cystoscopy with bilateral retrograde pyelogram with bilateral ureteral stent placement.  Risk and benefits discussed.  I will tentatively posted  for tomorrow.  However, more pressing is her anemia and she will need multiple transfusions.  May possibly need to delay depending on how she responds.  Marton Redwood, III 10/09/2020, 1:45 PM

## 2020-10-09 NOTE — Progress Notes (Signed)
Date and time results received: 10/09/20  1320  Test: hgb Critical Value: 3.8  Name of Provider Notified: Marylu Lund  Orders Received? Or Actions Taken?: Type & Screen and PRBC 3 units.

## 2020-10-09 NOTE — H&P (View-Only) (Signed)
H&P Physician requesting consult: Derrill Kay  Chief Complaint: Bilateral hydronephrosis  History of Present Illness: 65 year old female with a history of stage IV cervical cancer currently undergoing chemotherapy presented with vaginal bleeding.  She has also been dealing with hypokalemia.  She is admitted with severe fatigue and electrolyte abnormalities.  She has also had some failure to thrive.  She has a history of pancytopenia associated with her chemotherapy.  Most recent hemoglobin was 3.8.She did recently undergo repeat cervical cancer restaging with a CT of the chest abdomen and pelvis on 10/01/2020.  From a genitourinary standpoint, this revealed worsened severe left hydronephrosis as well as new moderate right-sided hydronephrosis secondary to ureteral obstruction by the cervical mass.  She has been having some pelvic pain and discomfort and having to strain to void.  She currently has a Foley catheter in place because of difficulty with ambulation upon admission.  Creatinine is stable at 1.35.  Past Medical History:  Diagnosis Date  . Anemia   . Anxiety   . Arthritis   . Cervical cancer, FIGO stage IVB [facility]) oncologist--- dr gorsuch/ dr Sondra Come   dx inital dx 09/ 2021, w/ mets to bone, started chemo 04-30-2020 and stopped 05-21-2020 due to worsening pancytopenia;  completed IMRT 06-16-2020 and scheduled to start brachytherapy boost high dose 06-21-2020  . Chemotherapy-induced nausea   . Cutaneous lupus erythematosus    followed by [facility]-- Marella Chimes PA  . Dyslipidemia   . Family history of breast cancer   . Family history of uterine cancer   . GERD (gastroesophageal reflux disease)   . Hemorrhoids   . History of 2019 novel coronavirus disease (COVID-19) 06/20/2019   [facility] , had mild symptoms that resolved  . History of colon polyps 04/06/2020   hyerplastic  . History of external beam radiation therapy 05-03-2020  to 06-16-2020   cervical cancer  .  History of gastric polyp 04/06/2020   hyperplastic  . History of gastric ulcer   . History of radiation therapy 05/03/20-06/16/20 and 06/21/20-07/19/20   Cervical IMRT and Cervical HDR Tandem & Ring: Dr Gery Pray  . Pancytopenia due to chemotherapy ([place])   . Wears glasses    Past Surgical History:  Procedure Laterality Date  . CATARACT EXTRACTION W/ INTRAOCULAR LENS IMPLANT Left 12/2019  . COLONOSCOPY WITH ESOPHAGOGASTRODUODENOSCOPY (EGD)  04-06-2020  dr Raliegh Ip. bea[place]  . ENDOMETRIAL ABLATION W/ NOVASURE  2010  . IR IMAGING GUIDED PORT INSERTION  04/26/2020  . OPERATIVE ULTRASOUND N/A 06/21/2020   Procedure: OPERATIVE ULTRASOUND;  Surgeon: Gery Pray, MD;  Location: [facility];  Service: Urology;  Laterality: N/A;  TECH  . OPERATIVE ULTRASOUND N/A 07/01/2020   Procedure: OPERATIVE ULTRASOUND;  Surgeon: Gery Pray, MD;  Location: [facility];  Service: Urology;  Laterality: N/A;  TECH  . OPERATIVE ULTRASOUND N/A 07/06/2020   Procedure: OPERATIVE ULTRASOUND;  Surgeon: Gery Pray, MD;  Location: [facility];  Service: Urology;  Laterality: N/A;  NEED TECH AND ULTRASOUND TO ARRIVE AT 8AM  . OPERATIVE ULTRASOUND N/A 07/14/2020   Procedure: OPERATIVE ULTRASOUND;  Surgeon: Gery Pray, MD;  Location: [facility];  Service: Urology;  Laterality: N/A;  . OPERATIVE ULTRASOUND N/A 07/19/2020   Procedure: OPERATIVE ULTRASOUND;  Surgeon: Gery Pray, MD;  Location: [facility];  Service: Urology;  Laterality: N/A;  . TANDEM RING INSERTION N/A 06/21/2020   Procedure: TANDEM RING INSERTION;  Surgeon: Gery Pray, MD;  Location:  [place];  Service: Urology;  Laterality: N/A;  . TANDEM RING INSERTION N/A 07/01/2020   Procedure: TANDEM RING INSERTION;  Surgeon: Gery Pray, MD;  Location: [facility];  Service: Urology;  Laterality: N/A;  . TANDEM RING INSERTION N/A 07/06/2020   Procedure:  TANDEM RING INSERTION;  Surgeon: Gery Pray, MD;  Location: [facility];  Service: Urology;  Laterality: N/A;  . TANDEM RING INSERTION N/A 07/14/2020   Procedure: TANDEM RING INSERTION;  Surgeon: Gery Pray, MD;  Location: [facility];  Service: Urology;  Laterality: N/A;  . TANDEM RING INSERTION N/A 07/19/2020   Procedure: TANDEM RING INSERTION;  Surgeon: Gery Pray, MD;  Location: [facility];  Service: Urology;  Laterality: N/A;    Home Medications:  Medications Prior to Admission  Medication Sig Dispense Refill Last Dose  . calcium carbonate (TUMS - DOSED IN MG ELEMENTAL CALCIUM) 500 MG chewable tablet Chew 1 tablet by mouth 2 (two) times daily.   10/07/2020 at Unknown time  . Cholecalciferol (VITAMIN D3) 2000 units TABS Take 4,000 Units by mouth daily.   10/07/2020 at Unknown time  . dexamethasone (DECADRON) 4 MG tablet Take 4 mg by mouth as directed.   Past Week at Unknown time  . fenofibrate 160 MG tablet TAKE 1 TABLET BY MOUTH EVERY DAY (Patient taking differently: Take 160 mg by mouth daily with lunch.) 30 tablet 5 10/07/2020 at Unknown time  . LORazepam (ATIVAN) 0.5 MG tablet Take 1 tablet (0.5 mg total) by mouth every 8 (eight) hours as needed for anxiety. 30 tablet 0 Past Week at Unknown time  . magnesium oxide (MAG-OX) 400 MG tablet Take 400 mg by mouth daily.   10/07/2020 at Unknown time  . pantoprazole (PROTONIX) 40 MG tablet Take 1 tablet (40 mg total) by mouth 2 (two) times daily. 90 tablet 3 10/07/2020 at Unknown time  . predniSONE (DELTASONE) 20 MG tablet Take 2 tablets (40 mg total) by mouth daily with breakfast. 60 tablet 1 10/07/2020 at Unknown time  . prochlorperazine (COMPAZINE) 10 MG tablet TAKE 1 TABLET BY MOUTH EVERY 6 HOURS AS NEEDED FOR NAUSEA AND/OR VOMITING (Patient taking differently: Take 10 mg by mouth every 6 (six) hours as needed.) 90 tablet 1 Past Week at Unknown time  . traMADol (ULTRAM) 50 MG tablet TAKE 2 TABLETS  BY MOUTH EVERY 6 HOURS AS NEEDED. (Patient taking differently: Take 25 mg by mouth every 6 (six) hours as needed for moderate pain.) 60 tablet 0 10/07/2020 at Unknown time  . lidocaine-prilocaine (EMLA) cream APPLY TO THE AFFECTED AREA(S) ONCE AS DIRECTED (Patient not taking: No sig reported) 30 g 3 Not Taking at Unknown time  . magnesium oxide (MAG-OX) 400 (241.3 Mg) MG tablet Take 1 tablet (400 mg total) by mouth daily. (Patient not taking: Reported on 10/08/2020) 30 tablet 1 Not Taking at Unknown time  . ondansetron (ZOFRAN) 8 MG tablet TAKE 1 TABLET BY MOUTH EVERY 8 HOURS AS NEEDED. START ON THE 3RD DAY AFTER CISPLATIN CHEMOTHERAPY (Patient not taking: No sig reported) 30 tablet 1 Not Taking at Unknown time   Allergies:  Allergies  Allergen Reactions  . Penicillins Rash    Family History  Problem Relation Age of Onset  . Hypertension Mother   . Emphysema Mother   . Uterine cancer Mother 24  . Hypertension Father   . Stroke Father   . Hypertension Brother   . Healthy Daughter   . Breast cancer Maternal  Aunt        d. 66  . Leukemia Cousin        d. 87s   Social History:  reports that she has never smoked. She has never used smokeless tobacco. She reports that she does not drink alcohol and does not use drugs.  ROS: A complete review of systems was performed.  All systems are negative except for pertinent findings as noted. ROS   Physical Exam:  Vital signs in last 24 hours: Temp:  [98.6 F (37 C)-100.9 F (38.3 C)] 99.3 F (37.4 C) (04/23 1041) Pulse Rate:  [100-123] 100 (04/23 1041) Resp:  [16-20] 17 (04/23 1041) BP: (106-125)/(56-64) 113/64 (04/23 1041) SpO2:  [90 %-99 %] 92 % (04/23 1041) Weight:  [99.4 kg] 99.4 kg (04/23 0500) General:  Alert and oriented, No acute distress HEENT: Normocephalic, atraumatic Neck: No JVD or lymphadenopathy Cardiovascular: Regular rate and rhythm Lungs: Regular rate and effort Abdomen: Soft, nontender, nondistended, no abdominal  masses Back: No CVA tenderness Extremities: No edema Neurologic: Grossly intact Genitourinary: Foley catheter draining clear yellow urine  Laboratory Data:  Results for orders placed or performed during the hospital encounter of 10/07/20 (from the past 24 hour(s))  Urinalysis, Routine w reflex microscopic Urine, Catheterized     Status: Abnormal   Collection Time: 10/08/20  4:00 PM  Result Value Ref Range   Color, Urine YELLOW YELLOW   APPearance CLEAR CLEAR   Specific Gravity, Urine 1.012 1.005 - 1.030   pH 6.0 5.0 - 8.0   Glucose, UA NEGATIVE NEGATIVE mg/dL   Hgb urine dipstick SMALL (A) NEGATIVE   Bilirubin Urine NEGATIVE NEGATIVE   Ketones, ur NEGATIVE NEGATIVE mg/dL   Protein, ur NEGATIVE NEGATIVE mg/dL   Nitrite NEGATIVE NEGATIVE   Leukocytes,Ua NEGATIVE NEGATIVE   RBC / HPF 0-5 0 - 5 RBC/hpf   WBC, UA 0-5 0 - 5 WBC/hpf   Bacteria, UA RARE (A) NONE SEEN   Mucus PRESENT    Hyaline Casts, UA PRESENT   Basic metabolic panel     Status: Abnormal   Collection Time: 10/08/20  5:58 PM  Result Value Ref Range   Sodium 138 135 - 145 mmol/L   Potassium 3.6 3.5 - 5.1 mmol/L   Chloride 105 98 - 111 mmol/L   CO2 24 22 - 32 mmol/L   Glucose, Bld 133 (H) 70 - 99 mg/dL   BUN 25 (H) 8 - 23 mg/dL   Creatinine, Ser 1.38 (H) 0.44 - 1.00 mg/dL   Calcium 7.8 (L) 8.9 - 10.3 mg/dL   GFR, Estimated 43 (L) >60 mL/min   Anion gap 9 5 - 15  Comprehensive metabolic panel     Status: Abnormal   Collection Time: 10/09/20 12:24 PM  Result Value Ref Range   Sodium 137 135 - 145 mmol/L   Potassium 3.1 (L) 3.5 - 5.1 mmol/L   Chloride 106 98 - 111 mmol/L   CO2 25 22 - 32 mmol/L   Glucose, Bld 133 (H) 70 - 99 mg/dL   BUN 23 8 - 23 mg/dL   Creatinine, Ser 1.35 (H) 0.44 - 1.00 mg/dL   Calcium 7.6 (L) 8.9 - 10.3 mg/dL   Total Protein 4.6 (L) 6.5 - 8.1 g/dL   Albumin 2.2 (L) 3.5 - 5.0 g/dL   AST 13 (L) 15 - 41 U/L   ALT 10 0 - 44 U/L   Alkaline Phosphatase 43 38 - 126 U/L   Total Bilirubin 0.4  0.3 - 1.2  mg/dL   GFR, Estimated 44 (L) >60 mL/min   Anion gap 6 5 - 15  CBC     Status: Abnormal   Collection Time: 10/09/20 12:57 PM  Result Value Ref Range   WBC 8.5 4.0 - 10.5 K/uL   RBC 1.21 (L) 3.87 - 5.11 MIL/uL   Hemoglobin 3.8 (LL) 12.0 - 15.0 g/dL   HCT 11.5 (L) 36.0 - 46.0 %   MCV 95.0 80.0 - 100.0 fL   MCH 31.4 26.0 - 34.0 pg   MCHC 33.0 30.0 - 36.0 g/dL   RDW 17.9 (H) 11.5 - 15.5 %   Platelets 52 (L) 150 - 400 K/uL   nRBC 0.5 (H) 0.0 - 0.2 %   Recent Results (from the past 240 hour(s))  Resp Panel by RT-PCR (Flu A&B, Covid) Nasopharyngeal Swab     Status: None   Collection Time: 10/08/20  2:32 AM   Specimen: Nasopharyngeal Swab; Nasopharyngeal(NP) swabs in vial transport medium  Result Value Ref Range Status   SARS Coronavirus 2 by RT PCR NEGATIVE NEGATIVE Final    Comment: (NOTE) SARS-CoV-2 target nucleic acids are NOT DETECTED.  The SARS-CoV-2 RNA is generally detectable in upper respiratory specimens during the acute phase of infection. The lowest concentration of SARS-CoV-2 viral copies this assay can detect is 138 copies/mL. A negative result does not preclude SARS-Cov-2 infection and should not be used as the sole basis for treatment or other patient management decisions. A negative result may occur with  improper specimen collection/handling, submission of specimen other than nasopharyngeal swab, presence of viral mutation(s) within the areas targeted by this assay, and inadequate number of viral copies(<138 copies/mL). A negative result must be combined with clinical observations, patient history, and epidemiological information. The expected result is Negative.  Fact Sheet for Patients:  EntrepreneurPulse.com.au  Fact Sheet for Healthcare Providers:  IncredibleEmployment.be  This test is no t yet approved or cleared by the [place] FDA and  has been authorized for detection and/or diagnosis of SARS-CoV-2  by FDA under an Emergency Use Authorization (EUA). This EUA will remain  in effect (meaning this test can be used) for the duration of the COVID-19 declaration under Section 564(b)(1) of the Act, 21 U.S.C.section 360bbb-3(b)(1), unless the authorization is terminated  or revoked sooner.       Influenza A by PCR NEGATIVE NEGATIVE Final   Influenza B by PCR NEGATIVE NEGATIVE Final    Comment: (NOTE) The Xpert Xpress SARS-CoV-2/FLU/RSV plus assay is intended as an aid in the diagnosis of influenza from Nasopharyngeal swab specimens and should not be used as a sole basis for treatment. Nasal washings and aspirates are unacceptable for Xpert Xpress SARS-CoV-2/FLU/RSV testing.  Fact Sheet for Patients: EntrepreneurPulse.com.au  Fact Sheet for Healthcare Providers: IncredibleEmployment.be  This test is not yet approved or cleared by the [place] FDA and has been authorized for detection and/or diagnosis of SARS-CoV-2 by FDA under an Emergency Use Authorization (EUA). This EUA will remain in effect (meaning this test can be used) for the duration of the COVID-19 declaration under Section 564(b)(1) of the Act, 21 U.S.C. section 360bbb-3(b)(1), unless the authorization is terminated or revoked.  Performed at [facility], [place] [address], [place], [place] [address]    Creatinine: Recent Labs    10/08/20 0040 10/08/20 0655 10/08/20 1758 10/09/20 1224  CREATININE 1.16* 1.12* 1.38* 1.35*    Impression/Assessment:  Bilateral hydronephrosis secondary to extrinsic compression of the ureters from cervical mass Acute renal insufficiency  Plan:  I discussed the options of bilateral ureteral stent placement as well as bilateral nephrostomy tube placement.  She would like to proceed with cystoscopy with bilateral retrograde pyelogram with bilateral ureteral stent placement.  Risk and benefits discussed.  I will tentatively posted  for tomorrow.  However, more pressing is her anemia and she will need multiple transfusions.  May possibly need to delay depending on how she responds.  Marton Redwood, III 10/09/2020, 1:45 PM

## 2020-10-09 NOTE — Progress Notes (Signed)
   10/09/20 0030  Assess: MEWS Score  Temp 99.6 F (37.6 C)  BP (!) 125/56  Pulse Rate (!) 123  Resp 20  SpO2 91 %  O2 Device Room Air  Assess: MEWS Score  MEWS Temp 0  MEWS Systolic 0  MEWS Pulse 2  MEWS RR 0  MEWS LOC 0  MEWS Score 2  MEWS Score Color Yellow  Assess: if the MEWS score is Yellow or Red  Were vital signs taken at a resting state? Yes  Focused Assessment No change from prior assessment  Early Detection of Sepsis Score *See Row Information* Low  MEWS guidelines implemented *See Row Information* Yes  Treat  MEWS Interventions Escalated (See documentation below)  Pain Scale 0-10  Pain Score 8  Pain Type Acute pain;Chronic pain  Pain Location Bladder  Pain Orientation Lower  Pain Frequency Constant  Pain Onset On-going  Pain Intervention(s) Medication (See eMAR)  Take Vital Signs  Increase Vital Sign Frequency  Yellow: Q 2hr X 2 then Q 4hr X 2, if remains yellow, continue Q 4hrs  Escalate  MEWS: Escalate Yellow: discuss with charge nurse/RN and consider discussing with provider and RRT  Notify: Charge Nurse/RN  Name of Charge Nurse/RN Notified Jayden, RN  Date Charge Nurse/RN Notified 10/09/20  Time Charge Nurse/RN Notified 0030  Document  Patient Outcome Other (Comment) (Stable on unit)  Progress note created (see row info) Yes

## 2020-10-09 NOTE — Progress Notes (Signed)
PROGRESS NOTE    Phyllis Hebert  BPZ:[id number] DOB: 10/30/1955 DOA: 10/07/2020 PCP: [name], MD    Brief Narrative:  65 y.o. female with medical history significant of stage IV cervical cancer currently on chemotherapy comes in with vaginal bleeding.  Patient was diagnosed November of last year and has not had any vaginal bleeding surprisingly.  Last night she had a lot of the bleeding came to the emergency room for further evaluation.  Also for the past week she did not get her routine chemotherapy because her potassium levels were very low.  She was given a sounds like 40 mEq IV as an outpatient but was not given any oral repletion.  She comes in today also very tired unsure how much of this is due to her chemotherapy or due to her electrolyte abnormalities.  She is also had a very poor p.o. intake justifiably.  She does get nauseous but does not vomit a whole lot.  Patient also found to have a potassium of 2.4 and a magnesium level of 1.5.  She did get transfused a unit of blood last week for her pancytopenia associated with her chemotherapy.  Her hemoglobin today is above 9 which is better than her usual.  Patient be referred for admission for vaginal bleeding and for electrolyte abnormalities. Oncology consulted  Assessment & Plan:   Principal Problem:   Vaginal bleeding Active Problems:   Cutaneous lupus erythematosus   Cervical cancer, FIGO stage IVB (HCC)   Essential hypertension   Hypomagnesemia   Hypokalemia    Vaginal bleeding with acute blood loss anemia -Hemodynamically stable at this time -repeat hgb this afternoon noted to be 3.8 -Have ordered 3 units PRBC's and follow serial hgb, continue to transfuse for hgb <7 -Oncology consulted and is following -repeat CBC in AM  Bilateral hydronephrosis -CT reviewed. Findings of L sided severe hydronephrosis with developing R sided hydronephrosis -Cr has increased to 1.3  -Have consulted Urology, appreciate recs.  Ultimate plan for stenting if/when pt is stable from an anemia standpoint -Recheck bmet in AM  Active Problems:    Cervical cancer, FIGO stage IVB (HCC)-oncology team has been consulted    Cutaneous lupus erythematosus-seems to be stable at this time    Essential hypertension-no hypertensive meds on home med list    Hypomagnesemia-replaced. Cont to follow lytes and replace as needed    Hypokalemia-replaced. Recheck lytes in AM and correct as needed    DVT prophylaxis: SCD's Code Status: Partial code (No CPR, no mechanical ventillation) Family Communication: Pt in room, family is at bedside  Status is: Inpatient  Remains inpatient appropriate because:Inpatient level of care appropriate due to severity of illness   Dispo: The patient is from: Home              Anticipated d/c is to: Home              Patient currently is not medically stable to d/c.   Difficult to place patient No       Consultants:   Urology  Oncology  Procedures:     Antimicrobials: Anti-infectives (From admission, onward)   None       Subjective: Feeling "bad" this afternoon. Denies nausea  Objective: Vitals:   10/09/20 0815 10/09/20 1041 10/09/20 1515 10/09/20 1545  BP: 106/61 113/64 133/66 (!) 143/74  Pulse: 100 100 (!) 113 (!) 114  Resp: 16 17  16   Temp: 98.6 F (37 C) 99.3 F (37.4 C) 98.3 F (  36.8 C) 98.7 F (37.1 C)  TempSrc: Oral Oral Oral   SpO2: 98% 92% 96% 97%  Weight:        Intake/Output Summary (Last 24 hours) at 10/09/2020 1722 Last data filed at 10/09/2020 0900 Gross per 24 hour  Intake 1779.91 ml  Output 500 ml  Net 1279.91 ml   Filed Weights   10/09/20 0500  Weight: 99.4 kg    Examination: General exam: Awake, laying in bed, in nad Respiratory system: Normal respiratory effort, no wheezing Cardiovascular system: regular rate, s1, s2 Gastrointestinal system: Soft, nondistended, positive BS Central nervous system: CN2-12 grossly intact,  strength intact Extremities: Perfused, no clubbing Skin: Normal skin turgor, no notable skin lesions seen, palor Psychiatry: Mood normal // no visual hallucinations   Data Reviewed: I have personally reviewed following labs and imaging studies  CBC: Recent Labs  Lab 10/04/20 0957 10/08/20 0040 10/08/20 0655 10/09/20 1257  WBC 6.3 8.7 6.7 8.5  NEUTROABS 5.7 7.3  --   --   HGB 8.1* 8.5* 9.4* 3.8*  HCT 25.1* 25.6* 28.8* 11.5*  MCV 91.3 91.1 93.5 95.0  PLT 106* 121* 89* 52*   Basic Metabolic Panel: Recent Labs  Lab 10/04/20 0957 10/08/20 0040 10/08/20 0225 10/08/20 0655 10/08/20 1021 10/08/20 1758 10/09/20 1224  NA  --  139  --  140  --  138 137  K 2.4* 2.4*  --  2.4*  --  3.6 3.1*  CL  --  104  --  108  --  105 106  CO2  --  24  --  22  --  24 25  GLUCOSE  --  166*  --  141*  --  133* 133*  BUN  --  24*  --  19  --  25* 23  CREATININE  --  1.16*  --  1.12*  --  1.38* 1.35*  CALCIUM  --  9.1  --  7.8*  --  7.8* 7.6*  MG  --   --  1.5*  --  1.7  --   --    GFR: Estimated Creatinine Clearance: 49.8 mL/min (A) (by C-G formula based on SCr of 1.35 mg/dL (H)). Liver Function Tests: Recent Labs  Lab 10/09/20 1224  AST 13*  ALT 10  ALKPHOS 43  BILITOT 0.4  PROT 4.6*  ALBUMIN 2.2*   No results for input(s): LIPASE, AMYLASE in the last 168 hours. No results for input(s): AMMONIA in the last 168 hours. Coagulation Profile: Recent Labs  Lab 10/08/20 0225  INR 1.0   Cardiac Enzymes: No results for input(s): CKTOTAL, CKMB, CKMBINDEX, TROPONINI in the last 168 hours. BNP (last 3 results) No results for input(s): PROBNP in the last 8760 hours. HbA1C: No results for input(s): HGBA1C in the last 72 hours. CBG: No results for input(s): GLUCAP in the last 168 hours. Lipid Profile: No results for input(s): CHOL, HDL, LDLCALC, TRIG, CHOLHDL, LDLDIRECT in the last 72 hours. Thyroid Function Tests: No results for input(s): TSH, T4TOTAL, FREET4, T3FREE, THYROIDAB in the  last 72 hours. Anemia Panel: No results for input(s): VITAMINB12, FOLATE, FERRITIN, TIBC, IRON, RETICCTPCT in the last 72 hours. Sepsis Labs: No results for input(s): PROCALCITON, LATICACIDVEN in the last 168 hours.  Recent Results (from the past 240 hour(s))  Resp Panel by RT-PCR (Flu A&B, Covid) Nasopharyngeal Swab     Status: None   Collection Time: 10/08/20  2:32 AM   Specimen: Nasopharyngeal Swab; Nasopharyngeal(NP) swabs in vial transport medium  Result  Value Ref Range Status   SARS Coronavirus 2 by RT PCR NEGATIVE NEGATIVE Final    Comment: (NOTE) SARS-CoV-2 target nucleic acids are NOT DETECTED.  The SARS-CoV-2 RNA is generally detectable in upper respiratory specimens during the acute phase of infection. The lowest concentration of SARS-CoV-2 viral copies this assay can detect is 138 copies/mL. A negative result does not preclude SARS-Cov-2 infection and should not be used as the sole basis for treatment or other patient management decisions. A negative result may occur with  improper specimen collection/handling, submission of specimen other than nasopharyngeal swab, presence of viral mutation(s) within the areas targeted by this assay, and inadequate number of viral copies(<138 copies/mL). A negative result must be combined with clinical observations, patient history, and epidemiological information. The expected result is Negative.  Fact Sheet for Patients:  EntrepreneurPulse.com.au  Fact Sheet for Healthcare Providers:  IncredibleEmployment.be  This test is no t yet approved or cleared by the [place] FDA and  has been authorized for detection and/or diagnosis of SARS-CoV-2 by FDA under an Emergency Use Authorization (EUA). This EUA will remain  in effect (meaning this test can be used) for the duration of the COVID-19 declaration under Section 564(b)(1) of the Act, 21 U.S.C.section 360bbb-3(b)(1), unless the authorization is  terminated  or revoked sooner.       Influenza A by PCR NEGATIVE NEGATIVE Final   Influenza B by PCR NEGATIVE NEGATIVE Final    Comment: (NOTE) The Xpert Xpress SARS-CoV-2/FLU/RSV plus assay is intended as an aid in the diagnosis of influenza from Nasopharyngeal swab specimens and should not be used as a sole basis for treatment. Nasal washings and aspirates are unacceptable for Xpert Xpress SARS-CoV-2/FLU/RSV testing.  Fact Sheet for Patients: EntrepreneurPulse.com.au  Fact Sheet for Healthcare Providers: IncredibleEmployment.be  This test is not yet approved or cleared by the [place] FDA and has been authorized for detection and/or diagnosis of SARS-CoV-2 by FDA under an Emergency Use Authorization (EUA). This EUA will remain in effect (meaning this test can be used) for the duration of the COVID-19 declaration under Section 564(b)(1) of the Act, 21 U.S.C. section 360bbb-3(b)(1), unless the authorization is terminated or revoked.  Performed at [facility], [place] [address], [place], [place] [address]      Radiology Studies: No results found.  Scheduled Meds: . Chlorhexidine Gluconate Cloth  6 each Topical Daily  . mouth rinse  15 mL Mouth Rinse BID  . potassium chloride  40 mEq Oral BID   Continuous Infusions: . lactated ringers 75 mL/hr at 10/09/20 0052     LOS: 1 day   Marylu Lund, MD [facility] Pager On Amion  If 7PM-7AM, please contact night-coverage 10/09/2020, 5:22 PM

## 2020-10-10 ENCOUNTER — Inpatient Hospital Stay (HOSPITAL_COMMUNITY): Payer: Managed Care, Other (non HMO) | Admitting: Certified Registered Nurse Anesthetist

## 2020-10-10 ENCOUNTER — Inpatient Hospital Stay (HOSPITAL_COMMUNITY): Payer: Managed Care, Other (non HMO)

## 2020-10-10 ENCOUNTER — Encounter (HOSPITAL_COMMUNITY)
Admission: EM | Disposition: A | Discharge status: Home / Self Care | Payer: Self-pay | Source: Home / Self Care | Attending: Internal Medicine

## 2020-10-10 HISTORY — PX: CYSTOSCOPY WITH STENT PLACEMENT: SHX5790

## 2020-10-10 LAB — COMPREHENSIVE METABOLIC PANEL
ALT: 11 U/L (ref 0–44)
AST: 12 U/L — ABNORMAL LOW (ref 15–41)
Albumin: 2.3 g/dL — ABNORMAL LOW (ref 3.5–5.0)
Alkaline Phosphatase: 53 U/L (ref 38–126)
Anion gap: 6 (ref 5–15)
BUN: 17 mg/dL (ref 8–23)
CO2: 25 mmol/L (ref 22–32)
Calcium: 7.8 mg/dL — ABNORMAL LOW (ref 8.9–10.3)
Chloride: 109 mmol/L (ref 98–111)
Creatinine, Ser: 1.1 mg/dL — ABNORMAL HIGH (ref 0.44–1.00)
GFR, Estimated: 56 mL/min — ABNORMAL LOW (ref >60)
Glucose, Bld: 119 mg/dL — ABNORMAL HIGH (ref 70–99)
Potassium: 3.6 mmol/L (ref 3.5–5.1)
Sodium: 140 mmol/L (ref 135–145)
Total Bilirubin: 0.7 mg/dL (ref 0.3–1.2)
Total Protein: 4.9 g/dL — ABNORMAL LOW (ref 6.5–8.1)

## 2020-10-10 LAB — HEMOGLOBIN AND HEMATOCRIT, BLOOD
HCT: 21.9 % — ABNORMAL LOW (ref 36.0–46.0)
Hemoglobin: 7.3 g/dL — ABNORMAL LOW (ref 12.0–15.0)

## 2020-10-10 LAB — CBC
HCT: 21.4 % — ABNORMAL LOW (ref 36.0–46.0)
HCT: 23.7 % — ABNORMAL LOW (ref 36.0–46.0)
Hemoglobin: 7 g/dL — ABNORMAL LOW (ref 12.0–15.0)
Hemoglobin: 7.7 g/dL — ABNORMAL LOW (ref 12.0–15.0)
MCH: 27.9 pg (ref 26.0–34.0)
MCH: 28.1 pg (ref 26.0–34.0)
MCHC: 32.7 g/dL (ref 30.0–36.0)
MCHC: 32.5 g/dL (ref 30.0–36.0)
MCV: 85.3 fL (ref 80.0–100.0)
MCV: 86.5 fL (ref 80.0–100.0)
Platelets: 57 10*3/uL — ABNORMAL LOW (ref 150–400)
Platelets: 57 10*3/uL — ABNORMAL LOW (ref 150–400)
RBC: 2.51 MIL/uL — ABNORMAL LOW (ref 3.87–5.11)
RBC: 2.74 MIL/uL — ABNORMAL LOW (ref 3.87–5.11)
RDW: 18.8 % — ABNORMAL HIGH (ref 11.5–15.5)
RDW: 18.9 % — ABNORMAL HIGH (ref 11.5–15.5)
WBC: 10.3 10*3/uL (ref 4.0–10.5)
WBC: 10.8 10*3/uL — ABNORMAL HIGH (ref 4.0–10.5)
nRBC: 0.2 % (ref 0.0–0.2)
nRBC: 0.2 % (ref 0.0–0.2)

## 2020-10-10 SURGERY — CYSTOSCOPY, WITH STENT INSERTION
Anesthesia: General | Site: Ureter | Laterality: Bilateral

## 2020-10-10 MED ORDER — PHENYLEPHRINE HCL (PRESSORS) 10 MG/ML IV SOLN
INTRAVENOUS | Status: AC
  Filled 2020-10-10: qty 1

## 2020-10-10 MED ORDER — ONDANSETRON HCL 4 MG/2ML IJ SOLN
INTRAMUSCULAR | Status: AC
  Filled 2020-10-10: qty 2

## 2020-10-10 MED ORDER — DEXAMETHASONE SODIUM PHOSPHATE 10 MG/ML IJ SOLN
INTRAMUSCULAR | Status: AC
  Filled 2020-10-10: qty 1

## 2020-10-10 MED ORDER — ONDANSETRON HCL 4 MG/2ML IJ SOLN
INTRAMUSCULAR | Status: DC | PRN
  Administered 2020-10-10: 4 mg via INTRAVENOUS

## 2020-10-10 MED ORDER — PROPOFOL 10 MG/ML IV BOLUS
INTRAVENOUS | Status: DC | PRN
  Administered 2020-10-10: 150 mg via INTRAVENOUS

## 2020-10-10 MED ORDER — MIDAZOLAM HCL 5 MG/5ML IJ SOLN
INTRAMUSCULAR | Status: DC | PRN
  Administered 2020-10-10: 2 mg via INTRAVENOUS

## 2020-10-10 MED ORDER — OXYCODONE HCL 5 MG PO TABS: 5.0000 mg | ORAL_TABLET | Freq: Once | ORAL | Status: DC | PRN

## 2020-10-10 MED ORDER — PROPOFOL 10 MG/ML IV BOLUS
INTRAVENOUS | Status: AC
  Filled 2020-10-10: qty 20

## 2020-10-10 MED ORDER — AMISULPRIDE (ANTIEMETIC) 5 MG/2ML IV SOLN: 10.0000 mg | Freq: Once | INTRAVENOUS | Status: DC | PRN

## 2020-10-10 MED ORDER — LACTATED RINGERS IV SOLN: INTRAVENOUS | Status: DC | PRN

## 2020-10-10 MED ORDER — DEXAMETHASONE SODIUM PHOSPHATE 10 MG/ML IJ SOLN
INTRAMUSCULAR | Status: DC | PRN
  Administered 2020-10-10: 8 mg via INTRAVENOUS

## 2020-10-10 MED ORDER — PROMETHAZINE HCL 25 MG/ML IJ SOLN: 6.2500 mg | INTRAMUSCULAR | Status: DC | PRN

## 2020-10-10 MED ORDER — HYDROMORPHONE HCL 1 MG/ML IJ SOLN
0.2500 mg | INTRAMUSCULAR | Status: DC | PRN
  Administered 2020-10-10: 0.25 mg via INTRAVENOUS

## 2020-10-10 MED ORDER — PHENYLEPHRINE 40 MCG/ML (10ML) SYRINGE FOR IV PUSH (FOR BLOOD PRESSURE SUPPORT)
PREFILLED_SYRINGE | INTRAVENOUS | Status: AC
  Filled 2020-10-10: qty 10

## 2020-10-10 MED ORDER — MIDAZOLAM HCL 2 MG/2ML IJ SOLN
INTRAMUSCULAR | Status: AC
  Filled 2020-10-10: qty 2

## 2020-10-10 MED ORDER — HYDROMORPHONE HCL 1 MG/ML IJ SOLN
INTRAMUSCULAR | Status: AC
  Filled 2020-10-10: qty 1

## 2020-10-10 MED ORDER — STERILE WATER FOR IRRIGATION IR SOLN
Status: DC | PRN
  Administered 2020-10-10: 3000 mL

## 2020-10-10 MED ORDER — CEFAZOLIN SODIUM-DEXTROSE 2-4 GM/100ML-% IV SOLN
INTRAVENOUS | Status: AC
  Filled 2020-10-10: qty 100

## 2020-10-10 MED ORDER — LIDOCAINE 2% (20 MG/ML) 5 ML SYRINGE
INTRAMUSCULAR | Status: AC
  Filled 2020-10-10: qty 5

## 2020-10-10 MED ORDER — FENTANYL CITRATE (PF) 100 MCG/2ML IJ SOLN
INTRAMUSCULAR | Status: AC
  Filled 2020-10-10: qty 2

## 2020-10-10 MED ORDER — FENTANYL CITRATE (PF) 100 MCG/2ML IJ SOLN
INTRAMUSCULAR | Status: DC | PRN
  Administered 2020-10-10 (×2): 50 ug via INTRAVENOUS

## 2020-10-10 MED ORDER — HYDROMORPHONE HCL 1 MG/ML IJ SOLN
0.5000 mg | INTRAMUSCULAR | Status: AC | PRN
  Administered 2020-10-10 – 2020-10-11 (×3): 0.5 mg via INTRAVENOUS
  Filled 2020-10-10 (×3): qty 0.5

## 2020-10-10 MED ORDER — OXYCODONE HCL 5 MG/5ML PO SOLN: 5.0000 mg | Freq: Once | ORAL | Status: DC | PRN

## 2020-10-10 MED ORDER — LACTATED RINGERS IV SOLN: INTRAVENOUS | Status: DC

## 2020-10-10 MED ORDER — LIDOCAINE 2% (20 MG/ML) 5 ML SYRINGE
INTRAMUSCULAR | Status: DC | PRN
  Administered 2020-10-10: 60 mg via INTRAVENOUS

## 2020-10-10 MED ORDER — PHENYLEPHRINE 40 MCG/ML (10ML) SYRINGE FOR IV PUSH (FOR BLOOD PRESSURE SUPPORT)
PREFILLED_SYRINGE | INTRAVENOUS | Status: DC | PRN
  Administered 2020-10-10: 40 ug via INTRAVENOUS
  Administered 2020-10-10: 80 ug via INTRAVENOUS

## 2020-10-10 MED ORDER — IOHEXOL 300 MG/ML  SOLN
INTRAMUSCULAR | Status: DC | PRN
  Administered 2020-10-10: 20 mL

## 2020-10-10 MED ORDER — CEFAZOLIN SODIUM-DEXTROSE 2-3 GM-%(50ML) IV SOLR
INTRAVENOUS | Status: DC | PRN
  Administered 2020-10-10: 2 g via INTRAVENOUS

## 2020-10-10 SURGICAL SUPPLY — 13 items
BAG URO CATCHER STRL LF (MISCELLANEOUS) ×2 IMPLANT
CATH INTERMIT  6FR 70CM (CATHETERS) ×2 IMPLANT
CLOTH BEACON ORANGE TIMEOUT ST (SAFETY) ×2 IMPLANT
GLOVE SURG ENC MOIS LTX SZ7.5 (GLOVE) ×2 IMPLANT
GOWN STRL REUS W/TWL LRG LVL3 (GOWN DISPOSABLE) ×4 IMPLANT
GOWN STRL REUS W/TWL XL LVL3 (GOWN DISPOSABLE) ×2 IMPLANT
GUIDEWIRE STR DUAL SENSOR (WIRE) ×2 IMPLANT
KIT TURNOVER KIT A (KITS) ×2 IMPLANT
MANIFOLD NEPTUNE II (INSTRUMENTS) ×2 IMPLANT
PACK CYSTO (CUSTOM PROCEDURE TRAY) ×2 IMPLANT
STENT URET 6FRX24 CONTOUR (STENTS) ×2 IMPLANT
TUBING CONNECTING 10 (TUBING) ×2 IMPLANT
TUBING UROLOGY SET (TUBING) IMPLANT

## 2020-10-10 NOTE — Anesthesia Preprocedure Evaluation (Signed)
Anesthesia Evaluation  Patient identified by MRN, date of birth, ID band  Reviewed: Allergy & Precautions, NPO status , Patient's Chart, lab work & pertinent test results  Airway Mallampati: I  TM Distance: >3 FB Neck ROM: Full    Dental no notable dental hx. (+) Teeth Intact, Dental Advisory Given   Pulmonary neg pulmonary ROS,    Pulmonary exam normal breath sounds clear to auscultation       Cardiovascular Exercise Tolerance: Good hypertension, Normal cardiovascular exam Rhythm:Regular Rate:Normal     Neuro/Psych PSYCHIATRIC DISORDERS Anxiety negative neurological ROS     GI/Hepatic Neg liver ROS, GERD  ,  Endo/Other    Renal/GU Renal InsufficiencyRenal disease     Musculoskeletal  (+) Arthritis ,   Abdominal (+) + obese,   Peds  Hematology  (+) anemia , hgb 10.5   Anesthesia Other Findings Cervical Cancer  Reproductive/Obstetrics                             Anesthesia Physical  Anesthesia Plan  ASA: III  Anesthesia Plan: General   Post-op Pain Management:    Induction: Intravenous  PONV Risk Score and Plan: 3 and Treatment may vary due to age or medical condition, Ondansetron, Dexamethasone and Midazolam  Airway Management Planned: LMA  Additional Equipment: None  Intra-op Plan:   Post-operative Plan: Extubation in OR  Informed Consent: I have reviewed the patients History and Physical, chart, labs and discussed the procedure including the risks, benefits and alternatives for the proposed anesthesia with the patient or authorized representative who has indicated his/her understanding and acceptance.     Dental advisory given  Plan Discussed with: CRNA and Anesthesiologist  Anesthesia Plan Comments:         Anesthesia Quick Evaluation

## 2020-10-10 NOTE — Op Note (Signed)
Operative Note  Preoperative diagnosis:  1.  Bilateral hydronephrosis secondary to extrinsic compression  Postoperative diagnosis: 1.  Bilateral hydronephrosis secondary to extrinsic compression 2.  Occluded left ureteral orifice 3.  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  Cystoscopy with right retrograde pyelogram with right ureteral stent placement 2.  Left diagnostic ureteroscopy  Surgeon: Link Snuffer, MD  Assistants: None  Anesthesia: General  Complications: None immediate  EBL: Minimal  Specimens: 1.  None  Drains/Catheters: 1.  Right 6 x 24 double-J ureteral stent 2.  Foley catheter  Intraoperative findings: 1.  Normal urethra 2.  Significant trabeculation within the bladder.  Low capacity bladder that seems somewhat noncompliant questionable secondary to her history of radiation. 3.  Right retrograde pyelogram revealed severe hydroureteronephrosis.  Successful placement of right ureteral stent 4.  Difficult to locate left ureteral orifice.  It looked like the distal ureter was completely occluded questionably by tumor?  Indication: 65 year old female with history of stage IV cervical cancer currently undergoing chemotherapy presenting with vaginal bleeding.  She has had acute blood loss anemia.  She had originally been scheduled to see urology outpatient for CT scan findings on 10/01/2020 that revealed severe left hydroureteronephrosis and new right-sided moderate hydronephrosis secondary to ureteral obstruction by cervical mass.  She presents for attempted bilateral ureteral stent placement.  Description of procedure:  The patient was identified and consent was obtained.  The patient was taken to the operating room and placed in the supine position.  The patient was placed under general anesthesia.  Perioperative antibiotics were administered.  The patient was placed in dorsal lithotomy.  Patient was prepped and draped in a standard sterile fashion and a timeout was  performed.  A 21 French rigid cystoscope was advanced into the urethra and into the bladder.  Complete cystoscopy was performed with findings noted above.  Specifically, the bladder.  To be somewhat noncompliant and lower capacity.  There was significant trabeculation.  I was able to easily locate the right ureteral orifice.  This was cannulated with an open-ended ureteral catheter and retrograde pyelogram was performed that showed severe hydroureteronephrosis down to the level of the distal ureter.  A wire was advanced up to the kidney under fluoroscopic guidance.  I then performed routine placement of a 6 x 24 double-J ureteral stent.  Fluoroscopy confirmed proximal placement and direct visualization confirmed a good coil within the bladder.  I turned my attention to the left ureteral orifice.  There was significantly abnormal trabeculation and bladder irregularities around the left ureteral orifice.  I was not able to advance a sensor wire up the left.  I was also not able to perform a left retrograde pyelogram.  I was able to finally identify the left ureteral orifice.  Despite multiple attempts, I was unable to instill contrast to perform retrograde pyelogram nor was able to advance a wire.  I therefore withdrew the scope and advanced a digital ureteroscope into the bladder.  I cannulated the distal ureter and was able to navigated up about 1 or 2 cm.  I then encountered a completely occluded lumen possibly from tumor.  I was not able to pass a wire through the scope and passed this area of blockage.  Therefore removed the scope and placed a Foley catheter.  This include the operation.  Patient tolerated the procedure well and was stable postoperative.  Plan: Continue right-sided ureteral stent.  She may have her Foley catheter discontinued once deemed appropriate by primary team and once she  is mobilizing more appropriately.  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  She will need to have her right ureteral stent exchanged in about 3 months.  In regards to the left ureter, she is unable to have a ureteral stent.  Recommend either observation or left nephrostomy tube.  Interventional radiology also may be able to place a nephroureteral stent antegrade.

## 2020-10-10 NOTE — Interval H&P Note (Signed)
History and Physical Interval Note:  10/10/2020 9:56 AM  Phyllis Hebert  has presented today for surgery, with the diagnosis of BILATERAL HYDRONEPHROSIS.  The various methods of treatment have been discussed with the patient and family. After consideration of risks, benefits and other options for treatment, the patient has consented to  Procedure(s): CYSTOSCOPY WITH STENT PLACEMENT (Bilateral) as a surgical intervention.  The patient's history has been reviewed, patient examined, no change in status, stable for surgery.  I have reviewed the patient's chart and labs.  Questions were answered to the patient's satisfaction.     Marton Redwood, III

## 2020-10-10 NOTE — Anesthesia Procedure Notes (Signed)
Procedure Name: LMA Insertion Date/Time: 10/10/2020 10:08 AM Performed by: Montel Clock, CRNA Pre-anesthesia Checklist: Patient identified, Emergency Drugs available, Suction available, Patient being monitored and Timeout performed Patient Re-evaluated:Patient Re-evaluated prior to induction Oxygen Delivery Method: Circle system utilized Preoxygenation: Pre-oxygenation with 100% oxygen Induction Type: IV induction Ventilation: Mask ventilation without difficulty LMA: LMA with gastric port inserted LMA Size: 4.0 Number of attempts: 1 Dental Injury: Teeth and Oropharynx as per pre-operative assessment

## 2020-10-10 NOTE — Anesthesia Postprocedure Evaluation (Signed)
Anesthesia Post Note  Patient: Phyllis Hebert  Procedure(s) Performed: CYSTOSCOPY WITH STENT PLACEMENT (Bilateral Ureter)     Patient location during evaluation: PACU Anesthesia Type: General Level of consciousness: awake and alert Pain management: pain level controlled Vital Signs Assessment: post-procedure vital signs reviewed and stable Respiratory status: spontaneous breathing, nonlabored ventilation and respiratory function stable Cardiovascular status: blood pressure returned to baseline and stable Postop Assessment: no apparent nausea or vomiting Anesthetic complications: no   No complications documented.  Last Vitals:  Vitals:   10/10/20 1145 10/10/20 1200  BP: 137/76 131/79  Pulse: 84 81  Resp: (!) 21 20  Temp: 37 C   SpO2: 95% 100%    Last Pain:  Vitals:   10/10/20 1145  TempSrc:   PainSc: 0-No pain                 Lynda Rainwater

## 2020-10-10 NOTE — Progress Notes (Signed)
PROGRESS NOTE    Phyllis Hebert  ZOX:[id number] DOB: 1956-02-22 DOA: 10/07/2020 PCP: Phyllis Minium, MD    Brief Narrative:  65 y.o. female with medical history significant of stage IV cervical cancer currently on chemotherapy comes in with vaginal bleeding.  Patient was diagnosed November of last year and has not had any vaginal bleeding surprisingly.  Last night she had a lot of the bleeding came to the emergency room for further evaluation.  Also for the past week she did not get her routine chemotherapy because her potassium levels were very low.  She was given a sounds like 40 mEq IV as an outpatient but was not given any oral repletion.  She comes in today also very tired unsure how much of this is due to her chemotherapy or due to her electrolyte abnormalities.  She is also had a very poor p.o. intake justifiably.  She does get nauseous but does not vomit a whole lot.  Patient also found to have a potassium of 2.4 and a magnesium level of 1.5.  She did get transfused a unit of blood last week for her pancytopenia associated with her chemotherapy.  Her hemoglobin today is above 9 which is better than her usual.  Patient be referred for admission for vaginal bleeding and for electrolyte abnormalities. Oncology consulted  Assessment & Plan:   Principal Problem:   Vaginal bleeding Active Problems:   Cutaneous lupus erythematosus   Cervical cancer, FIGO stage IVB (HCC)   Essential hypertension   Hypomagnesemia   Hypokalemia    Vaginal bleeding with acute blood loss anemia -Hemodynamically stable at this time -repeat hgb this afternoon noted to be 3.8, given 3 units PRBC's with hgb down to 7.0 this AM -Oncology consulted and is following -will repeat CBC, transfuse for hgb <7 -will repeat CBC in AM  Bilateral hydronephrosis -CT reviewed. Findings of L sided severe hydronephrosis with developing R sided hydronephrosis -Cr peaked to 1.3, now 1.1 this AM -Appreciate assistance by  Urology. Pt is now s/p R ureteral stent placement. Unable to place stent on L side -Discussed with Urology, consideration for L sided nephrostomy tube later when anemia has stabilized -Recheck bmet in AM  Active Problems:    Cervical cancer, FIGO stage IVB (HCC)-oncology team has been consulted    Cutaneous lupus erythematosus-seems to be stable at this time    Essential hypertension-no hypertensive meds on home med list    Hypomagnesemia-replaced. Cont to follow lytes and replace as needed    Hypokalemia-replaced. Recheck lytes in AM and correct as needed    DVT prophylaxis: SCD's Code Status: Partial code (No CPR, no mechanical ventillation) Family Communication: Pt in room, family is at bedside  Status is: Inpatient  Remains inpatient appropriate because:Inpatient level of care appropriate due to severity of illness   Dispo: The patient is from: Home              Anticipated d/c is to: Home              Patient currently is not medically stable to d/c.   Difficult to place patient No    Consultants:   Urology  Oncology  Procedures:   R ureteral stent placement 4/24  Antimicrobials: Anti-infectives (From admission, onward)   Start     Dose/Rate Route Frequency Ordered Stop   10/10/20 0939  ceFAZolin (ANCEF) 2-4 GM/100ML-% IVPB       Note to Pharmacy: Darlys Gales   : cabinet override  10/10/20 0939 10/10/20 2144      Subjective: Reports feeling somewhat better today  Objective: Vitals:   10/10/20 1130 10/10/20 1145 10/10/20 1200 10/10/20 1354  BP: (!) 141/74 137/76 131/79 127/74  Pulse: 88 84 81 76  Resp: 20 (!) 21 20 15   Temp:  98.6 F (37 C)  98.5 F (36.9 C)  TempSrc:    Oral  SpO2: 95% 95% 100% 98%  Weight:      Height:        Intake/Output Summary (Last 24 hours) at 10/10/2020 1536 Last data filed at 10/10/2020 1444 Gross per 24 hour  Intake 1450 ml  Output 4400 ml  Net -2950 ml   Filed Weights   10/09/20 0500 10/10/20  0449  Weight: 99.4 kg 100.1 kg    Examination: General exam: Conversant, in no acute distress Respiratory system: normal chest rise, clear, no audible wheezing Cardiovascular system: regular rhythm, s1-s2 Gastrointestinal system: Nondistended, nontender, pos BS Central nervous system: No seizures, no tremors Extremities: No cyanosis, no joint deformities Skin: No rashes, no pallor Psychiatry: Affect normal // no auditory hallucinations    Data Reviewed: I have personally reviewed following labs and imaging studies  CBC: Recent Labs  Lab 10/04/20 0957 10/08/20 0040 10/08/20 0655 10/09/20 1257 10/10/20 0051 10/10/20 0510 10/10/20 1430  WBC 6.3 8.7 6.7 8.5  --  10.3 10.8*  NEUTROABS 5.7 7.3  --   --   --   --   --   HGB 8.1* 8.5* 9.4* 3.8* 7.3* 7.0* 7.7*  HCT 25.1* 25.6* 28.8* 11.5* 21.9* 21.4* 23.7*  MCV 91.3 91.1 93.5 95.0  --  85.3 86.5  PLT 106* 121* 89* 52*  --  57* 57*   Basic Metabolic Panel: Recent Labs  Lab 10/08/20 0040 10/08/20 0225 10/08/20 0655 10/08/20 1021 10/08/20 1758 10/09/20 1224 10/10/20 0510  NA 139  --  140  --  138 137 140  K 2.4*  --  2.4*  --  3.6 3.1* 3.6  CL 104  --  108  --  105 106 109  CO2 24  --  22  --  24 25 25   GLUCOSE 166*  --  141*  --  133* 133* 119*  BUN 24*  --  19  --  25* 23 17  CREATININE 1.16*  --  1.12*  --  1.38* 1.35* 1.10*  CALCIUM 9.1  --  7.8*  --  7.8* 7.6* 7.8*  MG  --  1.5*  --  1.7  --   --   --    GFR: Estimated Creatinine Clearance: 61.3 mL/min (A) (by C-G formula based on SCr of 1.1 mg/dL (H)). Liver Function Tests: Recent Labs  Lab 10/09/20 1224 10/10/20 0510  AST 13* 12*  ALT 10 11  ALKPHOS 43 53  BILITOT 0.4 0.7  PROT 4.6* 4.9*  ALBUMIN 2.2* 2.3*   No results for input(s): LIPASE, AMYLASE in the last 168 hours. No results for input(s): AMMONIA in the last 168 hours. Coagulation Profile: Recent Labs  Lab 10/08/20 0225  INR 1.0   Cardiac Enzymes: No results for input(s): CKTOTAL, CKMB,  CKMBINDEX, TROPONINI in the last 168 hours. BNP (last 3 results) No results for input(s): PROBNP in the last 8760 hours. HbA1C: No results for input(s): HGBA1C in the last 72 hours. CBG: No results for input(s): GLUCAP in the last 168 hours. Lipid Profile: No results for input(s): CHOL, HDL, LDLCALC, TRIG, CHOLHDL, LDLDIRECT in the last 72 hours. Thyroid  Function Tests: No results for input(s): TSH, T4TOTAL, FREET4, T3FREE, THYROIDAB in the last 72 hours. Anemia Panel: No results for input(s): VITAMINB12, FOLATE, FERRITIN, TIBC, IRON, RETICCTPCT in the last 72 hours. Sepsis Labs: No results for input(s): PROCALCITON, LATICACIDVEN in the last 168 hours.  Recent Results (from the past 240 hour(s))  Resp Panel by RT-PCR (Flu A&B, Covid) Nasopharyngeal Swab     Status: None   Collection Time: 10/08/20  2:32 AM   Specimen: Nasopharyngeal Swab; Nasopharyngeal(NP) swabs in vial transport medium  Result Value Ref Range Status   SARS Coronavirus 2 by RT PCR NEGATIVE NEGATIVE Final    Comment: (NOTE) SARS-CoV-2 target nucleic acids are NOT DETECTED.  The SARS-CoV-2 RNA is generally detectable in upper respiratory specimens during the acute phase of infection. The lowest concentration of SARS-CoV-2 viral copies this assay can detect is 138 copies/mL. A negative result does not preclude SARS-Cov-2 infection and should not be used as the sole basis for treatment or other patient management decisions. A negative result may occur with  improper specimen collection/handling, submission of specimen other than nasopharyngeal swab, presence of viral mutation(s) within the areas targeted by this assay, and inadequate number of viral copies(<138 copies/mL). A negative result must be combined with clinical observations, patient history, and epidemiological information. The expected result is Negative.  Fact Sheet for Patients:  EntrepreneurPulse.com.au  Fact Sheet for Healthcare  Providers:  IncredibleEmployment.be  This test is no t yet approved or cleared by the [place] FDA and  has been authorized for detection and/or diagnosis of SARS-CoV-2 by FDA under an Emergency Use Authorization (EUA). This EUA will remain  in effect (meaning this test can be used) for the duration of the COVID-19 declaration under Section 564(b)(1) of the Act, 21 U.S.C.section 360bbb-3(b)(1), unless the authorization is terminated  or revoked sooner.       Influenza A by PCR NEGATIVE NEGATIVE Final   Influenza B by PCR NEGATIVE NEGATIVE Final    Comment: (NOTE) The Xpert Xpress SARS-CoV-2/FLU/RSV plus assay is intended as an aid in the diagnosis of influenza from Nasopharyngeal swab specimens and should not be used as a sole basis for treatment. Nasal washings and aspirates are unacceptable for Xpert Xpress SARS-CoV-2/FLU/RSV testing.  Fact Sheet for Patients: EntrepreneurPulse.com.au  Fact Sheet for Healthcare Providers: IncredibleEmployment.be  This test is not yet approved or cleared by the [place] FDA and has been authorized for detection and/or diagnosis of SARS-CoV-2 by FDA under an Emergency Use Authorization (EUA). This EUA will remain in effect (meaning this test can be used) for the duration of the COVID-19 declaration under Section 564(b)(1) of the Act, 21 U.S.C. section 360bbb-3(b)(1), unless the authorization is terminated or revoked.  Performed at [facility], [place] [address], [place], [place] [address]      Radiology Studies: DG C-Arm 1-60 Min-No Report  Result Date: 10/10/2020 Fluoroscopy was utilized by the requesting physician.  No radiographic interpretation.    Scheduled Meds: . Chlorhexidine Gluconate Cloth  6 each Topical Daily  . HYDROmorphone      . mouth rinse  15 mL Mouth Rinse BID  . potassium chloride  40 mEq Oral BID   Continuous Infusions: .  ceFAZolin    . lactated ringers 75 mL/hr at 10/10/20 1406     LOS: 2 days   Marylu Lund, MD [facility] Pager On Amion  If 7PM-7AM, please contact night-coverage 10/10/2020, 3:36 PM

## 2020-10-10 NOTE — Transfer of Care (Signed)
Immediate Anesthesia Transfer of Care Note  Patient: Phyllis Hebert  Procedure(s) Performed: CYSTOSCOPY WITH STENT PLACEMENT (Bilateral Ureter)  Patient Location: PACU  Anesthesia Type:General  Level of Consciousness: drowsy and patient cooperative  Airway & Oxygen Therapy: Patient Spontanous Breathing and Patient connected to face mask oxygen  Post-op Assessment: Report given to RN and Post -op Vital signs reviewed and stable  Post vital signs: Reviewed and stable  Last Vitals:  Vitals Value Taken Time  BP 152/80 10/10/20 1118  Temp    Pulse 92 10/10/20 1120  Resp 24 10/10/20 1120  SpO2 100 % 10/10/20 1120  Vitals shown include unvalidated device data.  Last Pain:  Vitals:   10/10/20 0842  TempSrc:   PainSc: 6       Patients Stated Pain Goal: 0 (62/03/55 9741)  Complications: No complications documented.

## 2020-10-11 ENCOUNTER — Encounter (HOSPITAL_COMMUNITY): Payer: Self-pay | Admitting: Urology

## 2020-10-11 DIAGNOSIS — N939 Abnormal uterine and vaginal bleeding, unspecified: Secondary | ICD-10-CM

## 2020-10-11 LAB — IRON AND TIBC
Iron: 58 ug/dL (ref 28–170)
Saturation Ratios: 35 % — ABNORMAL HIGH (ref 10.4–31.8)
TIBC: 166 ug/dL — ABNORMAL LOW (ref 250–450)
UIBC: 108 ug/dL

## 2020-10-11 LAB — CBC
HCT: 21.2 % — ABNORMAL LOW (ref 36.0–46.0)
Hemoglobin: 7 g/dL — ABNORMAL LOW (ref 12.0–15.0)
MCH: 28.7 pg (ref 26.0–34.0)
MCHC: 33 g/dL (ref 30.0–36.0)
MCV: 86.9 fL (ref 80.0–100.0)
Platelets: 50 10*3/uL — ABNORMAL LOW (ref 150–400)
RBC: 2.44 MIL/uL — ABNORMAL LOW (ref 3.87–5.11)
RDW: 19.1 % — ABNORMAL HIGH (ref 11.5–15.5)
WBC: 9.7 10*3/uL (ref 4.0–10.5)
nRBC: 0 % (ref 0.0–0.2)

## 2020-10-11 LAB — PREPARE RBC (CROSSMATCH)

## 2020-10-11 LAB — FERRITIN: Ferritin: 704 ng/mL — ABNORMAL HIGH (ref 11–307)

## 2020-10-11 LAB — HEMOGLOBIN AND HEMATOCRIT, BLOOD
HCT: 26.7 % — ABNORMAL LOW (ref 36.0–46.0)
Hemoglobin: 8.8 g/dL — ABNORMAL LOW (ref 12.0–15.0)

## 2020-10-11 MED ORDER — FOLIC ACID 1 MG PO TABS
2.0000 mg | ORAL_TABLET | Freq: Every day | ORAL | Status: DC
Start: 2020-10-11 — End: 2020-10-14
  Administered 2020-10-11 – 2020-10-14 (×4): 2 mg via ORAL
  Filled 2020-10-11 (×4): qty 2

## 2020-10-11 MED ORDER — SENNOSIDES-DOCUSATE SODIUM 8.6-50 MG PO TABS
2.0000 | ORAL_TABLET | Freq: Two times a day (BID) | ORAL | Status: DC
  Administered 2020-10-11 – 2020-10-14 (×6): 2 via ORAL
  Filled 2020-10-11 (×6): qty 2

## 2020-10-11 MED ORDER — SODIUM CHLORIDE 0.9% IV SOLUTION
Freq: Once | INTRAVENOUS | Status: AC
Start: 2020-10-11 — End: 2020-10-11

## 2020-10-11 MED ORDER — HYDROMORPHONE HCL 1 MG/ML IJ SOLN
0.5000 mg | INTRAMUSCULAR | Status: AC | PRN
  Administered 2020-10-11 – 2020-10-12 (×3): 0.5 mg via INTRAVENOUS
  Filled 2020-10-11 (×3): qty 0.5

## 2020-10-11 MED ORDER — POLYETHYLENE GLYCOL 3350 17 G PO PACK
17.0000 g | PACK | Freq: Every day | ORAL | Status: DC
  Administered 2020-10-12 – 2020-10-14 (×3): 17 g via ORAL
  Filled 2020-10-11 (×3): qty 1

## 2020-10-11 NOTE — Progress Notes (Addendum)
[facility] OFFICE PROGRESS NOTE  Patient Care Team: [name], MD as PCP - General (Family Medicine) [name], MD as Consulting Physician (Obstetrics and Gynecology) [name], RN as Oncology Nurse Navigator (Oncology)  I have seen her, examined her and agree with documentation as follows  ASSESSMENT & PLAN:   Cervical cancer, FIGO stage IVB ([place]) I have reviewed multiple imaging studies with the patient and her husband Overall, she has positive response to treatment The radiologist commented on mixed response The slightly enlarged pulmonary nodule, in my opinion, does not constitute real progression I cannot explain the reason for new onset of right hydronephrosis but scarring near the cervix could contribute to this Her recent severe vaginal bleeding could be due to invasion into surrounding blood vessels, compounded by recent use of Avastin Overall, the patient tolerated treatment poorly due to pancytopenia I will review plan of care from systemic chemotherapy standpoint in the outpatient setting For now, continue aggressive supportive care  Vaginal bleeding/hemorrhage The patient received tranexamic acid No active bleeding at this time Monitor blood counts carefully   Bilateral hydronephrosis Status post right ureteral stent placement, unable to place stent on left side Renal function improved yesterday morning, not checked today Follow renal function closely Overall, the left kidney is atrophied and likely contribute little kidney function overall I do not recommend percutaneous nephrostomy tube placement to the left side because the risk outweighs the benefit Will try to get her Foley catheter out  Pancytopenia, acquired [facility]) The patient patient was profoundly anemic on admission Status post 3 units PRBCs on 4/23 Hemoglobin is 7.0 today we will transfuse 1 unit PRBCs Transfuse PRBCs to keep hemoglobin above 8 Platelets stable at  50,000 today, monitor  We discussed some of the risks, benefits, and alternatives of blood transfusions. The patient is symptomatic from anemia and the hemoglobin level is critically low.  Some of the side-effects to be expected including risks of transfusion reactions, chills, infection, syndrome of volume overload and risk of hospitalization from various reasons and the patient is willing to proceed.  Consent on the chart.   SLE (systemic lupus erythematosus) ([place]) She has recent flare of lupus She received a course of prednisone 40 mg daily Monitor for recurrent flare    Hypokalemia She has severe hypokalemia likely due to poor appetite and her diuretic therapy Has been receiving intermittent aggressive IV potassium replacement Management per hospitalist  Constipation We will start her on aggressive laxative therapy  Profound weakness We will try to get her up and about if possible prior to discharge  Goals of care The patient has an incurable malignancy and has difficulty tolerating chemotherapy secondary to pancytopenia Has developed significant vaginal bleeding/hemorrhage The patient is a limited code  Discharge planning The patient continues to have symptomatic anemia and requires transfusions Anticipate that she will be hospitalized for at least another 2 to 3 days  All questions were answered. The patient knows to call the clinic with any problems, questions or concerns.   Mikey Bussing, NP 10/11/2020 8:21 AM [name], MD  INTERVAL HISTORY: The patient was admitted due to vaginal bleeding/hemorrhage with symptomatic anemia Has received 3 units PRBC so far this admission with improvement of her symptoms Denies vaginal bleeding this morning Reports pelvic fullness this morning No bowel movement this morning but very little p.o. intake Notes very faint pink-tinged urine status post and placement I have reviewed note by urologist Her husband is present by the bedside  and her daughter, Jinny Blossom is also available over the phone to discuss plan of care She has not been up and about since admission  SUMMARY OF ONCOLOGIC HISTORY: Oncology History Overview Note  PD-L1 CPS: 2   Cervical cancer, FIGO stage IVB ([place])  03/16/2020 Initial Diagnosis   The patient had history of new onset vaginal spotting on March 16, 2020.  She immediately called her OB/GYN's office and achieved an appointment with her OB/GYN's partner, Dr. Murrell Redden, who saw and evaluated the patient on March 19, 2020.  At that time a transvaginal ultrasound scan was performed which revealed a uterus measuring 5.3 x 8.4 x 4.6 cm with an ill-defined endometrium.  There is a complex cyst on the left ovary measuring 2.6 cm with peripheral blood flow noted.  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  Immunostains were positive for CEA and CDX2, negative for p16, ER, vimentin, and PAX8.  The differential diagnosis included primary gastric type endocervical adenocarcinoma (though this typically stains positive for PAX8) or a metastasis from pancreaticobiliary or upper GI primary.   03/29/2020 Pathology Results   1. Surgical [P], duodenum - PEPTIC DUODENITIS. - NO DYSPLASIA OR MALIGNANCY. 2. Surgical [P], gastric antrum and gastric body - REACTIVE GASTROPATHY. [place] IS NEGATIVE FOR HELICOBACTER PYLORI. - NO INTESTINAL METAPLASIA, DYSPLASIA, OR MALIGNANCY. 3. Surgical [P], gastric nodule - MILD REACTIVE GASTROPATHY. - NO INTESTINAL METAPLASIA, DYSPLASIA, OR MALIGNANCY. 4. Surgical [P], stomach, lesser curve ulcers - REACTIVE GASTROPATHY WITH EROSIONS. - NO INTESTINAL METAPLASIA, DYSPLASIA, OR MALIGNANCY. 5. Surgical [P], gastric polyps - HYPERPLASTIC POLYP. - NO INTESTINAL METAPLASIA, DYSPLASIA OR MALIGNANCY. 6. Surgical [P], colon, descending, polyp - HYPERPLASTIC POLYP. - NO  DYSPLASIA OR MALIGNANCY.   04/06/2020 Procedure   EGD report  Normal esophagus. - A few gastric polyps. Suspected fundic gland polyps. - Non-bleeding gastric ulcers with pigmented material. Biopsied. - A single gastric polyp/nodule. Resected and retrieved. - Erythematous duodenopathy. Biopsied. - The examination was otherwise normal.   04/06/2020 Procedure   Colonoscopy report - Non-bleeding internal hemorrhoids. - One 4 mm polyp in the descending colon, removed with a cold snare. Resected and retrieved. - The examination was otherwise normal on direct and retroflexion views   04/19/2020 PET scan   1. Hypermetabolic uterine cervix mass compatible with known primary cervical malignancy, with hypermetabolism extending into the upper third of the vagina and extending throughout the uterine body. No frank extrauterine extension. 2. Mild to moderate left hydroureteronephrosis to the level of the left UVJ, concerning for tumor involvement of the left UVJ. 3. Hypermetabolic right common iliac nodal metastasis. 4. Hypermetabolic sclerotic right ischial bone metastasis. 5. Two indeterminate small scattered solid pulmonary nodules, below PET resolution, warranting attention on chest CT follow-up in 3 months. 6.  Aortic Atherosclerosis (ICD10-I70.0).   04/19/2020 Cancer Staging   Staging form: Cervix Uteri, AJCC Version 9 - Clinical stage from 04/19/2020: FIGO Stage IVB (cT3b, cN1a, cM1) - Signed by [name], MD on 04/19/2020   04/26/2020 Procedure   Successful placement of a right IJ approach Power Port with ultrasound and fluoroscopic guidance. The catheter is ready for use   04/30/2020 - 05/21/2020 Chemotherapy   The patient had cisplatin for chemotherapy treatment.     06/20/2020 Genetic Testing   Negative genetic testing on the CancerNext-Expanded+RNAinsight panel.  The CancerNext-Expanded gene panel offered by [facility] and includes sequencing and rearrangement analysis for the  following  77 genes: AIP, ALK, APC*, ATM*, AXIN2, BAP1, BARD1, BLM, BMPR1A, BRCA1*, BRCA2*, BRIP1*, CDC73, CDH1*, CDK4, CDKN1B, CDKN2A, CHEK2*, CTNNA1, DICER1, FANCC, FH, FLCN, GALNT12, KIF1B, LZTR1, MAX, MEN1, MET, MLH1*, MSH2*, MSH3, MSH6*, MUTYH*, NBN, NF1*, NF2, NTHL1, PALB2*, PHOX2B, PMS2*, POT1, PRKAR1A, PTCH1, PTEN*, RAD51C*, RAD51D*, RB1, RECQL, RET, SDHA, SDHAF2, SDHB, SDHC, SDHD, SMAD4, SMARCA4, SMARCB1, SMARCE1, STK11, SUFU, TMEM127, TP53*, TSC1, TSC2, VHL and XRCC2 (sequencing and deletion/duplication); EGFR, EGLN1, HOXB13, KIT, MITF, PDGFRA, POLD1, and POLE (sequencing only); EPCAM and GREM1 (deletion/duplication only). DNA and RNA analyses performed for * genes. The report date is June 20, 2020.   07/27/2020 PET scan   1. Enlarging and new bilateral pulmonary nodules are evident today. Larger nodules in the right upper lung show FDG accumulation. Imaging features compatible with metastatic disease. 2. New hypermetabolic lymph nodes identified in the retrocaval space of the upper abdomen and para-aortic space of the lower abdomen, consistent with new metastatic involvement. There is some persistent hypermetabolism associated with small lymph nodes along the right common iliac chain. 3. Interval decrease in hypermetabolism associated with the cervix. 4. Persistent, mildly progressive left hydroureteronephrosis. Left ureter is dilated down to the level it passes the cervix, just proximal to the UVJ. 5. Decreased hypermetabolism in the previously identified right ischial tuberosity lesion consistent with metastatic disease.  6. New punctate focus of hypermetabolism in the gastric fundus without underlying mass lesion evident by CT. Attention on follow-up recommended. 7. Tiny gas bubble in the bladder lumen is presumably secondary to recent instrumentation although bladder infection could have this appearance.   07/28/2020 -  Chemotherapy    Patient is on Treatment Plan: CERVICAL  PEMBROLIZUMAB/CARBOPLATIN + PACLITAXEL + BEVACIZUMAB Q21D       10/04/2020 Imaging   1. There are multiple bilateral pulmonary nodules, generally subsolid in composition. The majority of nodules are diminished in size, or even completely resolved. A nodule of the dependent superior segment left lower lobe however appears to be slightly increased in size, measuring 1.2 x 0.9 cm, previously 0.9 x 0.9 cm when measured similarly. Findings are generally consistent with treatment response of pulmonary metastatic disease, however with some evidence of mixed response. 2. No significant interval change in previously FDG avid subcentimeter retroperitoneal lymph nodes, consistent with nodal metastatic disease. 3. Redemonstrated sclerotic osseous lesion of the right ischial tuberosity, not significant changed. No new osseous lesions identified by CT. 4. Circumferential soft tissue thickening about the cervix and lower uterine segment, overall dimensions approximately 5.5 x 3.5 cm, not significantly changed, consistent with primary cervical malignancy. There is loss of fat plane to the adjacent posterior urinary bladder wall and thickening of the bladder wall, suggesting direct invasion. 5. Perirectal and presacral soft tissue stranding, slightly increased compared to prior examination and consistent with developing radiation change. 6. Worsened, severe left hydronephrosis and new, moderate right hydronephrosis secondary to obstruction by cervical mass.     Metastasis to bone ([place])  04/19/2020 Initial Diagnosis   Metastasis to bone ([place])   07/28/2020 -  Chemotherapy    Patient is on Treatment Plan: CERVICAL PEMBROLIZUMAB/CARBOPLATIN + PACLITAXEL + BEVACIZUMAB Q21D         REVIEW OF SYSTEMS:   Constitutional: Denies fevers, chills  Eyes: Denies blurriness of vision Ears, nose, mouth, throat, and face: Denies mucositis or sore throat Respiratory: Denies cough, dyspnea or wheezes Cardiovascular: Denies  palpitation, chest discomfort or lower extremity swelling Lymphatics: Denies new lymphadenopathy or easy bruising Neurological:Denies numbness, tingling or new weaknesses Behavioral/Psych: Mood is  stable, no new changes  All other systems were reviewed with the patient and are negative.  I have reviewed the past medical history, past surgical history, social history and family history with the patient and they are unchanged from previous note.  ALLERGIES:  is allergic to penicillins.  MEDICATIONS:  Current Facility-Administered Medications  Medication Dose Route Frequency Provider Last Rate Last Admin   acetaminophen (TYLENOL) tablet 650 mg  650 mg Oral Q6H PRN Howerter, Justin B, DO   650 mg at 10/09/20 1832   Or   acetaminophen (TYLENOL) suppository 650 mg  650 mg Rectal Q6H PRN Howerter, Justin B, DO       Chlorhexidine Gluconate Cloth 2 % PADS 6 each  6 each Topical Daily Derrill Kay A, MD   6 each at 29/56/21 3086   folic acid (FOLVITE) tablet 2 mg  2 mg Oral Daily Volanda Napoleon, MD       HYDROmorphone (DILAUDID) injection 0.5 mg  0.5 mg Intravenous Q4H PRN Lovey Newcomer T, NP   0.5 mg at 10/11/20 0341   lactated ringers infusion   Intravenous Continuous Howerter, Justin B, DO 75 mL/hr at 10/10/20 1406 New Bag at 10/10/20 1406   LORazepam (ATIVAN) injection 0.5 mg  0.5 mg Intravenous Q6H PRN Howerter, Justin B, DO   0.5 mg at 10/09/20 2114   MEDLINE mouth rinse  15 mL Mouth Rinse BID Derrill Kay A, MD   15 mL at 10/10/20 2001   prochlorperazine (COMPAZINE) injection 10 mg  10 mg Intravenous Q4H PRN Donne Hazel, MD        PHYSICAL EXAMINATION: ECOG PERFORMANCE STATUS: 2 - Symptomatic, <50% confined to bed  Vitals:   10/10/20 1958 10/11/20 0549  BP: (!) 114/59 126/62  Pulse: 74 (!) 58  Resp: 17 15  Temp: 98.3 F (36.8 C) 98.1 F (36.7 C)  SpO2: 97% 97%   Filed Weights   10/09/20 0500 10/10/20 0449 10/11/20 0549  Weight: 99.4 kg 100.1 kg 95.7 kg     GENERAL:alert, no distress and comfortable.  She looks pale SKIN: No rashes ABD: Positive bowel sounds, soft, nontender NEURO: alert & oriented x 3 with fluent speech, no focal motor/sensory deficits  LABORATORY DATA:  I have reviewed the data as listed    Component Value Date/Time   NA 140 10/10/2020 0510   NA 140 06/21/2016 1530   K 3.6 10/10/2020 0510   CL 109 10/10/2020 0510   CO2 25 10/10/2020 0510   GLUCOSE 119 (H) 10/10/2020 0510   BUN 17 10/10/2020 0510   BUN 16 06/21/2016 1530   CREATININE 1.10 (H) 10/10/2020 0510   CREATININE 1.38 (H) 10/01/2020 1429   CREATININE 0.87 07/02/2017 1549   CALCIUM 7.8 (L) 10/10/2020 0510   PROT 4.9 (L) 10/10/2020 0510   PROT 7.3 06/21/2016 1530   ALBUMIN 2.3 (L) 10/10/2020 0510   ALBUMIN 4.3 06/21/2016 1530   AST 12 (L) 10/10/2020 0510   AST 10 (L) 10/01/2020 1429   ALT 11 10/10/2020 0510   ALT 8 10/01/2020 1429   ALKPHOS 53 10/10/2020 0510   BILITOT 0.7 10/10/2020 0510   BILITOT 0.4 10/01/2020 1429   GFRNONAA 56 (L) 10/10/2020 0510   GFRNONAA 43 (L) 10/01/2020 1429   GFRAA 111 06/21/2016 1530    No results found for: SPEP, UPEP  Lab Results  Component Value Date   WBC 9.7 10/11/2020   NEUTROABS 7.3 10/08/2020   HGB 7.0 (L) 10/11/2020   HCT 21.2 (L)  10/11/2020   MCV 86.9 10/11/2020   PLT 50 (L) 10/11/2020      Chemistry      Component Value Date/Time   NA 140 10/10/2020 0510   NA 140 06/21/2016 1530   K 3.6 10/10/2020 0510   CL 109 10/10/2020 0510   CO2 25 10/10/2020 0510   BUN 17 10/10/2020 0510   BUN 16 06/21/2016 1530   CREATININE 1.10 (H) 10/10/2020 0510   CREATININE 1.38 (H) 10/01/2020 1429   CREATININE 0.87 07/02/2017 1549      Component Value Date/Time   CALCIUM 7.8 (L) 10/10/2020 0510   ALKPHOS 53 10/10/2020 0510   AST 12 (L) 10/10/2020 0510   AST 10 (L) 10/01/2020 1429   ALT 11 10/10/2020 0510   ALT 8 10/01/2020 1429   BILITOT 0.7 10/10/2020 0510   BILITOT 0.4 10/01/2020 1429        RADIOGRAPHIC STUDIES: I have reviewed multiple imaging studies with the patient and her husband I have personally reviewed the radiological images as listed and agreed with the findings in the report. CT CHEST ABDOMEN PELVIS W CONTRAST  Result Date: 10/03/2020 CLINICAL DATA:  Metastatic cervical cancer restaging EXAM: CT CHEST, ABDOMEN, AND PELVIS WITH CONTRAST TECHNIQUE: Multidetector CT imaging of the chest, abdomen and pelvis was performed following the standard protocol during bolus administration of intravenous contrast. CONTRAST:  34m ISOVUE-300 IOPAMIDOL (ISOVUE-300) INJECTION 61%, additional oral enteric contrast COMPARISON:  PET-CT, 07/27/2020, PET-CT, 04/19/2020 FINDINGS: CT CHEST FINDINGS Cardiovascular: Right chest port catheter. Normal heart size. No pericardial effusion. Mediastinum/Nodes: No enlarged mediastinal, hilar, or axillary lymph nodes. Thyroid gland, trachea, and esophagus demonstrate no significant findings. Lungs/Pleura: There are multiple bilateral pulmonary nodules, generally subsolid in composition. The majority of nodules are diminished in size, for example a nodule of the medial posterior right upper lobe which has been reduced to an elongated residual measuring approximately 0.9 x 0.3 cm, previously 0.9 x 0.6 cm (series 4, image 45). A small nodule of the central left upper lobe previously seen is almost entirely resolved, measuring no greater than 1-2 mm (series 4, image 60). A nodule of the dependent superior segment left lower lobe however appears to be slightly increased in size, measuring 1.2 x 0.9 cm, previously 0.9 x 0.9 cm when measured similarly (series 4, image 47). No pleural effusion or pneumothorax. Musculoskeletal: No chest wall mass or suspicious bone lesions identified. CT ABDOMEN PELVIS FINDINGS Hepatobiliary: No solid liver abnormality is seen. No gallstones, gallbladder wall thickening, or biliary dilatation. Pancreas: Unremarkable. No pancreatic ductal  dilatation or surrounding inflammatory changes. Spleen: Normal in size without significant abnormality. Adrenals/Urinary Tract: Adrenal glands are unremarkable. There is redemonstrated severe left hydronephrosis, worsened compared to prior examination, with relative atrophy and cortical hypoenhancement of the left kidney. There is new moderate right hydronephrosis and hydroureter. The distal ureters are obstructed in the pelvis by soft tissue about the uterine cervix (series 2, image 110). Thickening of the urinary bladder wall. Stomach/Bowel: Stomach is within normal limits. Appendix appears normal. No evidence of bowel wall thickening, distention, or inflammatory changes. Vascular/Lymphatic: No significant vascular findings are present. No significant interval change in subcentimeter retroperitoneal lymph nodes, measuring up to 1.0 x 0.6 cm and previously FDG avid (series 2, image 76) Reproductive: Heterogeneous appearance and enhancement of the uterine fundus (series 8, image 112). Circumferential soft tissue thickening about the cervix and lower uterine segment, overall dimensions approximately 5.5 x 3.5 cm, not significantly changed (series 2, image 108). There is loss of  fat plane to the adjacent posterior urinary bladder wall and thickening of the bladder wall. Perirectal and presacral soft tissue stranding, slightly increased compared to prior examination. Other: No abdominal wall hernia or abnormality. No abdominopelvic ascites. Musculoskeletal: Redemonstrated sclerotic osseous lesion of the right ischial tuberosity (series 2, image 119). IMPRESSION: 1. There are multiple bilateral pulmonary nodules, generally subsolid in composition. The majority of nodules are diminished in size, or even completely resolved. A nodule of the dependent superior segment left lower lobe however appears to be slightly increased in size, measuring 1.2 x 0.9 cm, previously 0.9 x 0.9 cm when measured similarly. Findings are  generally consistent with treatment response of pulmonary metastatic disease, however with some evidence of mixed response. 2. No significant interval change in previously FDG avid subcentimeter retroperitoneal lymph nodes, consistent with nodal metastatic disease. 3. Redemonstrated sclerotic osseous lesion of the right ischial tuberosity, not significant changed. No new osseous lesions identified by CT. 4. Circumferential soft tissue thickening about the cervix and lower uterine segment, overall dimensions approximately 5.5 x 3.5 cm, not significantly changed, consistent with primary cervical malignancy. There is loss of fat plane to the adjacent posterior urinary bladder wall and thickening of the bladder wall, suggesting direct invasion. 5. Perirectal and presacral soft tissue stranding, slightly increased compared to prior examination and consistent with developing radiation change. 6. Worsened, severe left hydronephrosis and new, moderate right hydronephrosis secondary to obstruction by cervical mass. Electronically Signed   By: Eddie Candle M.D.   On: 10/03/2020 18:27   DG C-Arm 1-60 Min-No Report  Result Date: 10/10/2020 Fluoroscopy was utilized by the requesting physician.  No radiographic interpretation.

## 2020-10-11 NOTE — Progress Notes (Signed)
PROGRESS NOTE    Phyllis Hebert  ZJI:[id number] DOB: 30-Jan-1956 DOA: 10/07/2020 PCP: [name], MD    Brief Narrative:  65 y.o. female with medical history significant of stage IV cervical cancer currently on chemotherapy comes in with vaginal bleeding.  Patient was diagnosed November of last year and has not had any vaginal bleeding surprisingly.  Last night she had a lot of the bleeding came to the emergency room for further evaluation.  Also for the past week she did not get her routine chemotherapy because her potassium levels were very low.  She was given a sounds like 40 mEq IV as an outpatient but was not given any oral repletion.  She comes in today also very tired unsure how much of this is due to her chemotherapy or due to her electrolyte abnormalities.  She is also had a very poor p.o. intake justifiably.  She does get nauseous but does not vomit a whole lot.  Patient also found to have a potassium of 2.4 and a magnesium level of 1.5.  She did get transfused a unit of blood last week for her pancytopenia associated with her chemotherapy.  Her hemoglobin today is above 9 which is better than her usual.  Patient be referred for admission for vaginal bleeding and for electrolyte abnormalities. Oncology consulted  Assessment & Plan:   Principal Problem:   Vaginal bleeding Active Problems:   Cutaneous lupus erythematosus   Malignant neoplasm of cervix (HCC)   Essential hypertension   Hypomagnesemia   Hypokalemia    Vaginal bleeding with acute blood loss anemia -Hemodynamically stable at this time -repeat hgb this afternoon noted to be 3.8, given 3 units PRBC's with hgb down to 7.0 this AM -Oncology consulted and is following -PRBC transfusion given today per Oncology -Recheck CBC in AM  Bilateral hydronephrosis -CT reviewed. Findings of L sided severe hydronephrosis with developing R sided hydronephrosis -Cr peaked to 1.3, now 1.1 this AM -Appreciate assistance by  Urology. Pt is now s/p R ureteral stent placement. Unable to place stent on L side -D/c foley today with voiding trail underway -Per Urology, recommendation for follow up in 2.47months with option for renal scan to determine function of L kidney. At this time, cont with R sided stent alone for now. Would have pt f/u with Urology -Recheck bmet in AM  Active Problems:    Cervical cancer, FIGO stage IVB (HCC)-oncology team has been consulted, following    Cutaneous lupus erythematosus-seems to be stable at this time    Essential hypertension-no hypertensive meds on home med list    Hypomagnesemia-replaced. Cont to follow lytes and replace as needed    Hypokalemia-replaced. Repeat lytes in AM and cont to correct as needed    DVT prophylaxis: SCD's Code Status: Partial code (No CPR, no mechanical ventillation) Family Communication: Pt in room, family is at bedside  Status is: Inpatient  Remains inpatient appropriate because:Inpatient level of care appropriate due to severity of illness   Dispo: The patient is from: Home              Anticipated d/c is to: Home              Patient currently is not medically stable to d/c.   Difficult to place patient No    Consultants:   Urology  Oncology  Procedures:   R ureteral stent placement 4/24  Antimicrobials: Anti-infectives (From admission, onward)   Start     Dose/Rate Route Frequency  Ordered Stop   10/10/20 0939  ceFAZolin (ANCEF) 2-4 GM/100ML-% IVPB       Note to Pharmacy: Darlys Gales   : cabinet override      10/10/20 0939 10/10/20 2144      Subjective: Reports feeling better  Objective: Vitals:   10/11/20 1209 10/11/20 1229 10/11/20 1231 10/11/20 1445  BP: (!) 121/53 (!) 119/57 (!) 119/57 127/72  Pulse: 67 64 64 (!) 58  Resp: 16 18 18 18   Temp: 98.8 F (37.1 C) 98 F (36.7 C) 98 F (36.7 C) 98.1 F (36.7 C)  TempSrc:   Oral Oral  SpO2: 96%  94% 97%  Weight:      Height:        Intake/Output  Summary (Last 24 hours) at 10/11/2020 1652 Last data filed at 10/11/2020 1600 Gross per 24 hour  Intake 3654.63 ml  Output 1550 ml  Net 2104.63 ml   Filed Weights   10/09/20 0500 10/10/20 0449 10/11/20 0549  Weight: 99.4 kg 100.1 kg 95.7 kg    Examination: General exam: Awake, laying in bed, in nad Respiratory system: Normal respiratory effort, no wheezing Cardiovascular system: regular rate, s1, s2 Gastrointestinal system: Soft, nondistended, positive BS Central nervous system: CN2-12 grossly intact, strength intact Extremities: Perfused, no clubbing Skin: Normal skin turgor, no notable skin lesions seen Psychiatry: Mood normal // no visual hallucinations   Data Reviewed: I have personally reviewed following labs and imaging studies  CBC: Recent Labs  Lab 10/08/20 0040 10/08/20 0655 10/09/20 1257 10/10/20 0051 10/10/20 0510 10/10/20 1430 10/11/20 0512  WBC 8.7 6.7 8.5  --  10.3 10.8* 9.7  NEUTROABS 7.3  --   --   --   --   --   --   HGB 8.5* 9.4* 3.8* 7.3* 7.0* 7.7* 7.0*  HCT 25.6* 28.8* 11.5* 21.9* 21.4* 23.7* 21.2*  MCV 91.1 93.5 95.0  --  85.3 86.5 86.9  PLT 121* 89* 52*  --  57* 57* 50*   Basic Metabolic Panel: Recent Labs  Lab 10/08/20 0040 10/08/20 0225 10/08/20 0655 10/08/20 1021 10/08/20 1758 10/09/20 1224 10/10/20 0510  NA 139  --  140  --  138 137 140  K 2.4*  --  2.4*  --  3.6 3.1* 3.6  CL 104  --  108  --  105 106 109  CO2 24  --  22  --  24 25 25   GLUCOSE 166*  --  141*  --  133* 133* 119*  BUN 24*  --  19  --  25* 23 17  CREATININE 1.16*  --  1.12*  --  1.38* 1.35* 1.10*  CALCIUM 9.1  --  7.8*  --  7.8* 7.6* 7.8*  MG  --  1.5*  --  1.7  --   --   --    GFR: Estimated Creatinine Clearance: 59.9 mL/min (A) (by C-G formula based on SCr of 1.1 mg/dL (H)). Liver Function Tests: Recent Labs  Lab 10/09/20 1224 10/10/20 0510  AST 13* 12*  ALT 10 11  ALKPHOS 43 53  BILITOT 0.4 0.7  PROT 4.6* 4.9*  ALBUMIN 2.2* 2.3*   No results for  input(s): LIPASE, AMYLASE in the last 168 hours. No results for input(s): AMMONIA in the last 168 hours. Coagulation Profile: Recent Labs  Lab 10/08/20 0225  INR 1.0   Cardiac Enzymes: No results for input(s): CKTOTAL, CKMB, CKMBINDEX, TROPONINI in the last 168 hours. BNP (last 3 results) No results for  input(s): PROBNP in the last 8760 hours. HbA1C: No results for input(s): HGBA1C in the last 72 hours. CBG: No results for input(s): GLUCAP in the last 168 hours. Lipid Profile: No results for input(s): CHOL, HDL, LDLCALC, TRIG, CHOLHDL, LDLDIRECT in the last 72 hours. Thyroid Function Tests: No results for input(s): TSH, T4TOTAL, FREET4, T3FREE, THYROIDAB in the last 72 hours. Anemia Panel: No results for input(s): VITAMINB12, FOLATE, FERRITIN, TIBC, IRON, RETICCTPCT in the last 72 hours. Sepsis Labs: No results for input(s): PROCALCITON, LATICACIDVEN in the last 168 hours.  Recent Results (from the past 240 hour(s))  Resp Panel by RT-PCR (Flu A&B, Covid) Nasopharyngeal Swab     Status: None   Collection Time: 10/08/20  2:32 AM   Specimen: Nasopharyngeal Swab; Nasopharyngeal(NP) swabs in vial transport medium  Result Value Ref Range Status   SARS Coronavirus 2 by RT PCR NEGATIVE NEGATIVE Final    Comment: (NOTE) SARS-CoV-2 target nucleic acids are NOT DETECTED.  The SARS-CoV-2 RNA is generally detectable in upper respiratory specimens during the acute phase of infection. The lowest concentration of SARS-CoV-2 viral copies this assay can detect is 138 copies/mL. A negative result does not preclude SARS-Cov-2 infection and should not be used as the sole basis for treatment or other patient management decisions. A negative result may occur with  improper specimen collection/handling, submission of specimen other than nasopharyngeal swab, presence of viral mutation(s) within the areas targeted by this assay, and inadequate number of viral copies(<138 copies/mL). A negative  result must be combined with clinical observations, patient history, and epidemiological information. The expected result is Negative.  Fact Sheet for Patients:  EntrepreneurPulse.com.au  Fact Sheet for Healthcare Providers:  IncredibleEmployment.be  This test is no t yet approved or cleared by the [place] FDA and  has been authorized for detection and/or diagnosis of SARS-CoV-2 by FDA under an Emergency Use Authorization (EUA). This EUA will remain  in effect (meaning this test can be used) for the duration of the COVID-19 declaration under Section 564(b)(1) of the Act, 21 U.S.C.section 360bbb-3(b)(1), unless the authorization is terminated  or revoked sooner.       Influenza A by PCR NEGATIVE NEGATIVE Final   Influenza B by PCR NEGATIVE NEGATIVE Final    Comment: (NOTE) The Xpert Xpress SARS-CoV-2/FLU/RSV plus assay is intended as an aid in the diagnosis of influenza from Nasopharyngeal swab specimens and should not be used as a sole basis for treatment. Nasal washings and aspirates are unacceptable for Xpert Xpress SARS-CoV-2/FLU/RSV testing.  Fact Sheet for Patients: EntrepreneurPulse.com.au  Fact Sheet for Healthcare Providers: IncredibleEmployment.be  This test is not yet approved or cleared by the [place] FDA and has been authorized for detection and/or diagnosis of SARS-CoV-2 by FDA under an Emergency Use Authorization (EUA). This EUA will remain in effect (meaning this test can be used) for the duration of the COVID-19 declaration under Section 564(b)(1) of the Act, 21 U.S.C. section 360bbb-3(b)(1), unless the authorization is terminated or revoked.  Performed at [facility], [place] [address], [place], [place] [address]      Radiology Studies: DG C-Arm 1-60 Min-No Report  Result Date: 10/10/2020 Fluoroscopy was utilized by the requesting physician.  No  radiographic interpretation.    Scheduled Meds: . Chlorhexidine Gluconate Cloth  6 each Topical Daily  . folic acid  2 mg Oral Daily  . mouth rinse  15 mL Mouth Rinse BID  . polyethylene glycol  17 g Oral Daily  . senna-docusate  2 tablet Oral BID   Continuous Infusions: . lactated ringers 75 mL/hr at 10/10/20 1406     LOS: 3 days   Marylu Lund, MD [facility] Pager On Amion  If 7PM-7AM, please contact night-coverage 10/11/2020, 4:52 PM

## 2020-10-11 NOTE — Progress Notes (Signed)
Urology Inpatient Progress Report  Hypokalemia [E87.6] Hypomagnesemia [E83.42] Vaginal bleeding [N93.9] Malignant neoplasm of cervix, unspecified site ([place]) [C53.9]  Procedure(s): CYSTOSCOPY WITH STENT PLACEMENT  1 Day Post-Op   Intv/Subj: No acute events overnight. Patient is without complaint Except for some right-sided pressure.  She was about have her Foley catheter removed.  Her oncologist recommended against nephrostomy tube.  Principal Problem:   Vaginal bleeding Active Problems:   Cutaneous lupus erythematosus   Malignant neoplasm of cervix ([place])   Essential hypertension   Hypomagnesemia   Hypokalemia  Current Facility-Administered Medications  Medication Dose Route Frequency Provider Last Rate Last Admin  . acetaminophen (TYLENOL) tablet 650 mg  650 mg Oral Q6H PRN Howerter, Justin B, DO   650 mg at 10/09/20 1832   Or  . acetaminophen (TYLENOL) suppository 650 mg  650 mg Rectal Q6H PRN Howerter, Justin B, DO      . Chlorhexidine Gluconate Cloth 2 % PADS 6 each  6 each Topical Daily Phillips Grout, MD   6 each at 10/11/20 1008  . folic acid (FOLVITE) tablet 2 mg  2 mg Oral Daily Volanda Napoleon, MD   2 mg at 10/11/20 1008  . lactated ringers infusion   Intravenous Continuous Howerter, Justin B, DO 75 mL/hr at 10/10/20 1406 New Bag at 10/10/20 1406  . LORazepam (ATIVAN) injection 0.5 mg  0.5 mg Intravenous Q6H PRN Howerter, Justin B, DO   0.5 mg at 10/09/20 2114  . MEDLINE mouth rinse  15 mL Mouth Rinse BID Derrill Kay A, MD   15 mL at 10/11/20 1009  . polyethylene glycol (MIRALAX / GLYCOLAX) packet 17 g  17 g Oral Daily Gorsuch, Ni, MD      . prochlorperazine (COMPAZINE) injection 10 mg  10 mg Intravenous Q4H PRN Donne Hazel, MD      . senna-docusate (Senokot-S) tablet 2 tablet  2 tablet Oral BID [name], MD         Objective: Vital: Vitals:   10/11/20 1209 10/11/20 1229 10/11/20 1231 10/11/20 1445  BP: (!) 121/53 (!) 119/57 (!) 119/57 127/72  Pulse:  67 64 64 (!) 58  Resp: 16 18 18 18   Temp: 98.8 F (37.1 C) 98 F (36.7 C) 98 F (36.7 C) 98.1 F (36.7 C)  TempSrc:   Oral Oral  SpO2: 96%  94% 97%  Weight:      Height:       I/Os: I/O last 3 completed shifts: In: 1082.5 [I.V.:600; Blood:482.5] Out: 9381 [Urine:4900]  Physical Exam:  General: Patient is in no apparent distress Lungs: Normal respiratory effort, chest expands symmetrically. GI:  The abdomen is soft and nontender without mass. Foley:   Draining clear yellow urine  Ext: lower extremities symmetric  Lab Results: Recent Labs    10/10/20 0510 10/10/20 1430 10/11/20 0512  WBC 10.3 10.8* 9.7  HGB 7.0* 7.7* 7.0*  HCT 21.4* 23.7* 21.2*   Recent Labs    10/08/20 1758 10/09/20 1224 10/10/20 0510  NA 138 137 140  K 3.6 3.1* 3.6  CL 105 106 109  CO2 24 25 25   GLUCOSE 133* 133* 119*  BUN 25* 23 17  CREATININE 1.38* 1.35* 1.10*  CALCIUM 7.8* 7.6* 7.8*   No results for input(s): LABPT, INR in the last 72 hours. No results for input(s): LABURIN in the last 72 hours. Results for orders placed or performed during the hospital encounter of 10/07/20  Resp Panel by RT-PCR (Flu A&B, Covid) Nasopharyngeal Swab  Status: None   Collection Time: 10/08/20  2:32 AM   Specimen: Nasopharyngeal Swab; Nasopharyngeal(NP) swabs in vial transport medium  Result Value Ref Range Status   SARS Coronavirus 2 by RT PCR NEGATIVE NEGATIVE Final    Comment: (NOTE) SARS-CoV-2 target nucleic acids are NOT DETECTED.  The SARS-CoV-2 RNA is generally detectable in upper respiratory specimens during the acute phase of infection. The lowest concentration of SARS-CoV-2 viral copies this assay can detect is 138 copies/mL. A negative result does not preclude SARS-Cov-2 infection and should not be used as the sole basis for treatment or other patient management decisions. A negative result may occur with  improper specimen collection/handling, submission of specimen other than  nasopharyngeal swab, presence of viral mutation(s) within the areas targeted by this assay, and inadequate number of viral copies(<138 copies/mL). A negative result must be combined with clinical observations, patient history, and epidemiological information. The expected result is Negative.  Fact Sheet for Patients:  EntrepreneurPulse.com.au  Fact Sheet for Healthcare Providers:  IncredibleEmployment.be  This test is no t yet approved or cleared by the [place] FDA and  has been authorized for detection and/or diagnosis of SARS-CoV-2 by FDA under an Emergency Use Authorization (EUA). This EUA will remain  in effect (meaning this test can be used) for the duration of the COVID-19 declaration under Section 564(b)(1) of the Act, 21 U.S.C.section 360bbb-3(b)(1), unless the authorization is terminated  or revoked sooner.       Influenza A by PCR NEGATIVE NEGATIVE Final   Influenza B by PCR NEGATIVE NEGATIVE Final    Comment: (NOTE) The Xpert Xpress SARS-CoV-2/FLU/RSV plus assay is intended as an aid in the diagnosis of influenza from Nasopharyngeal swab specimens and should not be used as a sole basis for treatment. Nasal washings and aspirates are unacceptable for Xpert Xpress SARS-CoV-2/FLU/RSV testing.  Fact Sheet for Patients: EntrepreneurPulse.com.au  Fact Sheet for Healthcare Providers: IncredibleEmployment.be  This test is not yet approved or cleared by the [place] FDA and has been authorized for detection and/or diagnosis of SARS-CoV-2 by FDA under an Emergency Use Authorization (EUA). This EUA will remain in effect (meaning this test can be used) for the duration of the COVID-19 declaration under Section 564(b)(1) of the Act, 21 U.S.C. section 360bbb-3(b)(1), unless the authorization is terminated or revoked.  Performed at [facility], [place] [address], [place], [place] [address]     Studies/Results: DG C-Arm 1-60 Min-No Report  Result Date: 10/10/2020 Fluoroscopy was utilized by the requesting physician.  No radiographic interpretation.    Assessment:  bilateral hydronephrosis secondary to malignancy Procedure(s): CYSTOSCOPY WITH STENT PLACEMENT, 1 Day Post-Op  doing well.  Plan:  she will need follow-up   With me in about 2.5 months.  Agree with voiding trial.  I also discussed with her the option of renal scan to determine the function of the left.  It is obvious on the scan that the left kidney is more atrophic with some thinner parenchyma.  Renal function is nearly normal and is certainly adequate with her right-sided kidney unobstructed.  I think it is reasonable to forego left nephrostomy tube.  If she were to want to proceed with this, I would want to obtain a renal scan 1st.  However, she would prefer to just continue the right-sided stent and leave the left side alone for now.   Link Snuffer, MD Urology 10/11/2020, 4:23 PM

## 2020-10-12 DIAGNOSIS — C539 Malignant neoplasm of cervix uteri, unspecified: Principal | ICD-10-CM

## 2020-10-12 DIAGNOSIS — N1330 Unspecified hydronephrosis: Secondary | ICD-10-CM

## 2020-10-12 DIAGNOSIS — N939 Abnormal uterine and vaginal bleeding, unspecified: Secondary | ICD-10-CM | POA: Diagnosis not present

## 2020-10-12 DIAGNOSIS — D539 Nutritional anemia, unspecified: Secondary | ICD-10-CM

## 2020-10-12 LAB — TYPE AND SCREEN
ABO/RH(D): B NEG
Antibody Screen: NEGATIVE
Unit division: 0
Unit division: 0
Unit division: 0
Unit division: 0

## 2020-10-12 LAB — COMPREHENSIVE METABOLIC PANEL
ALT: 18 U/L (ref 0–44)
AST: 19 U/L (ref 15–41)
Albumin: 2.1 g/dL — ABNORMAL LOW (ref 3.5–5.0)
Alkaline Phosphatase: 52 U/L (ref 38–126)
Anion gap: 5 (ref 5–15)
BUN: 17 mg/dL (ref 8–23)
CO2: 25 mmol/L (ref 22–32)
Calcium: 8.1 mg/dL — ABNORMAL LOW (ref 8.9–10.3)
Chloride: 111 mmol/L (ref 98–111)
Creatinine, Ser: 1.05 mg/dL — ABNORMAL HIGH (ref 0.44–1.00)
GFR, Estimated: 60 mL/min — ABNORMAL LOW (ref >60)
Glucose, Bld: 99 mg/dL (ref 70–99)
Potassium: 3.6 mmol/L (ref 3.5–5.1)
Sodium: 141 mmol/L (ref 135–145)
Total Bilirubin: 0.3 mg/dL (ref 0.3–1.2)
Total Protein: 5.3 g/dL — ABNORMAL LOW (ref 6.5–8.1)

## 2020-10-12 LAB — CBC
HCT: 24.8 % — ABNORMAL LOW (ref 36.0–46.0)
Hemoglobin: 8.1 g/dL — ABNORMAL LOW (ref 12.0–15.0)
MCH: 28.8 pg (ref 26.0–34.0)
MCHC: 32.7 g/dL (ref 30.0–36.0)
MCV: 88.3 fL (ref 80.0–100.0)
Platelets: 48 10*3/uL — ABNORMAL LOW (ref 150–400)
RBC: 2.81 MIL/uL — ABNORMAL LOW (ref 3.87–5.11)
RDW: 18.1 % — ABNORMAL HIGH (ref 11.5–15.5)
WBC: 7.3 10*3/uL (ref 4.0–10.5)
nRBC: 0.6 % — ABNORMAL HIGH (ref 0.0–0.2)

## 2020-10-12 LAB — BPAM RBC
Blood Product Expiration Date: 202205112359
Blood Product Expiration Date: 202205142359
Blood Product Expiration Date: 202205202359
Blood Product Expiration Date: 202205212359
ISSUE DATE / TIME: 202204231526
ISSUE DATE / TIME: 202204231806
ISSUE DATE / TIME: 202204232040
ISSUE DATE / TIME: 202204251159
Unit Type and Rh: 1700
Unit Type and Rh: 1700
Unit Type and Rh: 1700
Unit Type and Rh: 1700

## 2020-10-12 MED ORDER — HYDROMORPHONE HCL 2 MG PO TABS
2.0000 mg | ORAL_TABLET | Freq: Three times a day (TID) | ORAL | Status: DC
  Administered 2020-10-12 – 2020-10-14 (×7): 2 mg via ORAL
  Filled 2020-10-12 (×7): qty 1

## 2020-10-12 MED ORDER — BISACODYL 10 MG RE SUPP
10.0000 mg | Freq: Once | RECTAL | Status: AC
  Administered 2020-10-12: 10 mg via RECTAL
  Filled 2020-10-12: qty 1

## 2020-10-12 MED ORDER — HYDROMORPHONE HCL 1 MG/ML IJ SOLN
0.5000 mg | INTRAMUSCULAR | Status: DC | PRN
  Administered 2020-10-12 – 2020-10-13 (×3): 0.5 mg via INTRAVENOUS
  Filled 2020-10-12 (×3): qty 0.5

## 2020-10-12 NOTE — Progress Notes (Signed)
PROGRESS NOTE    Phyllis Hebert  YNW:[id number] DOB: July 10, 1955 DOA: 10/07/2020 PCP: [name], MD    Brief Narrative:  65 y.o. female with medical history significant of stage IV cervical cancer currently on chemotherapy comes in with vaginal bleeding.  Patient was diagnosed November of last year and has not had any vaginal bleeding surprisingly.  Last night she had a lot of the bleeding came to the emergency room for further evaluation.  Also for the past week she did not get her routine chemotherapy because her potassium levels were very low.  She was given a sounds like 40 mEq IV as an outpatient but was not given any oral repletion.  She comes in today also very tired unsure how much of this is due to her chemotherapy or due to her electrolyte abnormalities.  She is also had a very poor p.o. intake justifiably.  She does get nauseous but does not vomit a whole lot.  Patient also found to have a potassium of 2.4 and a magnesium level of 1.5.  She did get transfused a unit of blood last week for her pancytopenia associated with her chemotherapy.  Her hemoglobin today is above 9 which is better than her usual.  Patient be referred for admission for vaginal bleeding and for electrolyte abnormalities. Oncology consulted  Assessment & Plan:   Principal Problem:   Vaginal bleeding Active Problems:   Cutaneous lupus erythematosus   Malignant neoplasm of cervix (HCC)   Essential hypertension   Hypomagnesemia   Hypokalemia    Vaginal bleeding with acute blood loss anemia -Hemodynamically stable at this time -repeat hgb this afternoon noted to be 3.8, given 3 units PRBC's with hgb down to 7.0 this AM -Oncology consulted and is following -PRBC transfusion given 4/25 per Oncology -Hgb down to 8.1 this AM. Per Oncology, recommendation for continued observation for now incase patient requires further transfusion  Bilateral hydronephrosis -CT reviewed. Findings of L sided severe  hydronephrosis with developing R sided hydronephrosis -Cr peaked to 1.3, now 1.05 this AM -Appreciate assistance by Urology. Pt is now s/p R ureteral stent placement. Unable to place stent on L side -Foley cath successfully removed -Per Urology, recommendation for follow up in 2.32months with option for renal scan to determine function of L kidney. At this time, cont with R sided stent alone for now.  -Pt to follow up with Urology as outpatient -recheck bmet in AM  Active Problems:    Cervical cancer, FIGO stage IVB [facility]) -oncology team has been consulted, following    Cutaneous lupus erythematosus -seems to be stable at this time    Essential hypertension -no hypertensive meds on home med list    Hypomagnesemia -replaced. Cont to follow lytes and replace as needed    Hypokalemia -stable. Repeat lytes in AM and cont to correct as needed    DVT prophylaxis: SCD's Code Status: Partial code (No CPR, no mechanical ventillation) Family Communication: Pt in room, family is at bedside  Status is: Inpatient  Remains inpatient appropriate because:Inpatient level of care appropriate due to severity of illness   Dispo: The patient is from: Home              Anticipated d/c is to: Home              Patient currently is not medically stable to d/c.   Difficult to place patient No    Consultants:   Urology  Oncology  Procedures:   R  ureteral stent placement 4/24  Antimicrobials: Anti-infectives (From admission, onward)   Start     Dose/Rate Route Frequency Ordered Stop   10/10/20 0939  ceFAZolin (ANCEF) 2-4 GM/100ML-% IVPB       Note to Pharmacy: Darlys Gales   : cabinet override      10/10/20 0939 10/10/20 2144      Subjective: States feeling better  Objective: Vitals:   10/11/20 1445 10/11/20 2036 10/12/20 0533 10/12/20 1417  BP: 127/72 136/63 (!) 163/80 139/72  Pulse: (!) 58 (!) 55 67 75  Resp: 18 16 18 18   Temp: 98.1 F (36.7 C) 98.2 F (36.8 C)  98.1 F (36.7 C) 98 F (36.7 C)  TempSrc: Oral Oral Oral Oral  SpO2: 97% 99% 99% 98%  Weight:   99.1 kg   Height:        Intake/Output Summary (Last 24 hours) at 10/12/2020 1552 Last data filed at 10/12/2020 1001 Gross per 24 hour  Intake 3786.4 ml  Output 1400 ml  Net 2386.4 ml   Filed Weights   10/10/20 0449 10/11/20 0549 10/12/20 0533  Weight: 100.1 kg 95.7 kg 99.1 kg    Examination: General exam: Conversant, in no acute distress Respiratory system: normal chest rise, clear, no audible wheezing Cardiovascular system: regular rhythm, s1-s2 Gastrointestinal system: Nondistended, nontender, pos BS Central nervous system: No seizures, no tremors Extremities: No cyanosis, no joint deformities Skin: No rashes, no pallor Psychiatry: Affect normal // no auditory hallucinations   Data Reviewed: I have personally reviewed following labs and imaging studies  CBC: Recent Labs  Lab 10/08/20 0040 10/08/20 0655 10/09/20 1257 10/10/20 0051 10/10/20 0510 10/10/20 1430 10/11/20 0512 10/11/20 1825 10/12/20 0500  WBC 8.7   < > 8.5  --  10.3 10.8* 9.7  --  7.3  NEUTROABS 7.3  --   --   --   --   --   --   --   --   HGB 8.5*   < > 3.8*   < > 7.0* 7.7* 7.0* 8.8* 8.1*  HCT 25.6*   < > 11.5*   < > 21.4* 23.7* 21.2* 26.7* 24.8*  MCV 91.1   < > 95.0  --  85.3 86.5 86.9  --  88.3  PLT 121*   < > 52*  --  57* 57* 50*  --  48*   < > = values in this interval not displayed.   Basic Metabolic Panel: Recent Labs  Lab 10/08/20 0225 10/08/20 0655 10/08/20 1021 10/08/20 1758 10/09/20 1224 10/10/20 0510 10/12/20 0500  NA  --  140  --  138 137 140 141  K  --  2.4*  --  3.6 3.1* 3.6 3.6  CL  --  108  --  105 106 109 111  CO2  --  22  --  24 25 25 25   GLUCOSE  --  141*  --  133* 133* 119* 99  BUN  --  19  --  25* 23 17 17   CREATININE  --  1.12*  --  1.38* 1.35* 1.10* 1.05*  CALCIUM  --  7.8*  --  7.8* 7.6* 7.8* 8.1*  MG 1.5*  --  1.7  --   --   --   --    GFR: Estimated Creatinine  Clearance: 63.9 mL/min (A) (by C-G formula based on SCr of 1.05 mg/dL (H)). Liver Function Tests: Recent Labs  Lab 10/09/20 1224 10/10/20 0510 10/12/20 0500  AST 13* 12* 19  ALT 10 11 18   ALKPHOS 43 53 52  BILITOT 0.4 0.7 0.3  PROT 4.6* 4.9* 5.3*  ALBUMIN 2.2* 2.3* 2.1*   No results for input(s): LIPASE, AMYLASE in the last 168 hours. No results for input(s): AMMONIA in the last 168 hours. Coagulation Profile: Recent Labs  Lab 10/08/20 0225  INR 1.0   Cardiac Enzymes: No results for input(s): CKTOTAL, CKMB, CKMBINDEX, TROPONINI in the last 168 hours. BNP (last 3 results) No results for input(s): PROBNP in the last 8760 hours. HbA1C: No results for input(s): HGBA1C in the last 72 hours. CBG: No results for input(s): GLUCAP in the last 168 hours. Lipid Profile: No results for input(s): CHOL, HDL, LDLCALC, TRIG, CHOLHDL, LDLDIRECT in the last 72 hours. Thyroid Function Tests: No results for input(s): TSH, T4TOTAL, FREET4, T3FREE, THYROIDAB in the last 72 hours. Anemia Panel: Recent Labs    10/11/20 1825  FERRITIN 704*  TIBC 166*  IRON 58   Sepsis Labs: No results for input(s): PROCALCITON, LATICACIDVEN in the last 168 hours.  Recent Results (from the past 240 hour(s))  Resp Panel by RT-PCR (Flu A&B, Covid) Nasopharyngeal Swab     Status: None   Collection Time: 10/08/20  2:32 AM   Specimen: Nasopharyngeal Swab; Nasopharyngeal(NP) swabs in vial transport medium  Result Value Ref Range Status   SARS Coronavirus 2 by RT PCR NEGATIVE NEGATIVE Final    Comment: (NOTE) SARS-CoV-2 target nucleic acids are NOT DETECTED.  The SARS-CoV-2 RNA is generally detectable in upper respiratory specimens during the acute phase of infection. The lowest concentration of SARS-CoV-2 viral copies this assay can detect is 138 copies/mL. A negative result does not preclude SARS-Cov-2 infection and should not be used as the sole basis for treatment or other patient management decisions.  A negative result may occur with  improper specimen collection/handling, submission of specimen other than nasopharyngeal swab, presence of viral mutation(s) within the areas targeted by this assay, and inadequate number of viral copies(<138 copies/mL). A negative result must be combined with clinical observations, patient history, and epidemiological information. The expected result is Negative.  Fact Sheet for Patients:  EntrepreneurPulse.com.au  Fact Sheet for Healthcare Providers:  IncredibleEmployment.be  This test is no t yet approved or cleared by the [place] FDA and  has been authorized for detection and/or diagnosis of SARS-CoV-2 by FDA under an Emergency Use Authorization (EUA). This EUA will remain  in effect (meaning this test can be used) for the duration of the COVID-19 declaration under Section 564(b)(1) of the Act, 21 U.S.C.section 360bbb-3(b)(1), unless the authorization is terminated  or revoked sooner.       Influenza A by PCR NEGATIVE NEGATIVE Final   Influenza B by PCR NEGATIVE NEGATIVE Final    Comment: (NOTE) The Xpert Xpress SARS-CoV-2/FLU/RSV plus assay is intended as an aid in the diagnosis of influenza from Nasopharyngeal swab specimens and should not be used as a sole basis for treatment. Nasal washings and aspirates are unacceptable for Xpert Xpress SARS-CoV-2/FLU/RSV testing.  Fact Sheet for Patients: EntrepreneurPulse.com.au  Fact Sheet for Healthcare Providers: IncredibleEmployment.be  This test is not yet approved or cleared by the [place] FDA and has been authorized for detection and/or diagnosis of SARS-CoV-2 by FDA under an Emergency Use Authorization (EUA). This EUA will remain in effect (meaning this test can be used) for the duration of the COVID-19 declaration under Section 564(b)(1) of the Act, 21 U.S.C. section 360bbb-3(b)(1), unless the authorization  is terminated or revoked.  Performed  at [facility], [place] [address], [place], [place] [address]      Radiology Studies: No results found.  Scheduled Meds: . Chlorhexidine Gluconate Cloth  6 each Topical Daily  . folic acid  2 mg Oral Daily  . HYDROmorphone  2 mg Oral TID  . mouth rinse  15 mL Mouth Rinse BID  . polyethylene glycol  17 g Oral Daily  . senna-docusate  2 tablet Oral BID   Continuous Infusions: . lactated ringers 75 mL/hr at 10/12/20 0346     LOS: 4 days   Marylu Lund, MD [facility] Pager On Amion  If 7PM-7AM, please contact night-coverage 10/12/2020, 3:52 PM

## 2020-10-12 NOTE — Evaluation (Signed)
Physical Therapy Evaluation Patient Details Name: Phyllis Hebert MRN: [id number] DOB: [birth year] Today's Date: 10/12/2020   History of Present Illness  65 yo female admitted with vaginal bleeding, anemia, uncontrolled pelvic pain, R hydronephrosis s/p R stent placement 4/24. Hx of cervical ca, SLE  Clinical Impression  On eval, pt was Min A for mobility. She walked ~7 feet forwards then backwards back to bed. Ambulation distance limited by pelvic pain. Pt able to perform bed mobility unassisted. She was somewhat upset about amount of time she was left sitting on bsc waiting for assistance from staff. Encouraged her to speak with leadership when they make their rounds to express her concerns. Will plan to follow and progress activity as pt is able to tolerated. Will recommend HHPT f/u at this time-will update recommendations if necessary.     Follow Up Recommendations Home health PT;Supervision for mobility/OOB    Equipment Recommendations  None recommended by PT (pt stated she has access to DME if needed)    Recommendations for Other Services       Precautions / Restrictions Precautions Precautions: Fall Restrictions Weight Bearing Restrictions: No      Mobility  Bed Mobility Overal bed mobility: Needs Assistance Bed Mobility: Sidelying to Sit;Sit to Sidelying   Sidelying to sit: Modified independent (Device/Increase time);HOB elevated     Sit to sidelying: Modified independent (Device/Increase time);HOB elevated      Transfers Overall transfer level: Needs assistance Equipment used: Rolling walker (2 wheeled) Transfers: Sit to/from Stand Sit to Stand: Min assist         General transfer comment: Assist to rise, steady, control descent. Cues for safety  Ambulation/Gait Ambulation/Gait assistance: Min assist Gait Distance (Feet): 7 Feet Assistive device: Rolling walker (2 wheeled) Gait Pattern/deviations: Step-through pattern;Decreased stride length      General Gait Details: 7 feet forwards then backwards. Distance limited by pain.  Stairs            Wheelchair Mobility    Modified Rankin (Stroke Patients Only)       Balance Overall balance assessment: Needs assistance         Standing balance support: Bilateral upper extremity supported Standing balance-Leahy Scale: Poor                               Pertinent Vitals/Pain Pain Assessment: 0-10 Pain Score: 6  Pain Location: 6/10 pelvis at rest, 8/10 pelvis sitting/upright mobility Pain Descriptors / Indicators: Pressure Pain Intervention(s): Monitored during session;Limited activity within patient's tolerance    Home Living Family/patient expects to be discharged to:: Private residence Living Arrangements: Spouse/significant other   Type of Home: House Home Access: Stairs to enter Entrance Stairs-Rails: Right Entrance Stairs-Number of Steps: 5 Home Layout: One level Home Equipment: Environmental consultant - 2 wheels;Cane - single point      Prior Function Level of Independence: Independent               Hand Dominance        Extremity/Trunk Assessment        Lower Extremity Assessment Lower Extremity Assessment: Generalized weakness    Cervical / Trunk Assessment Cervical / Trunk Assessment: Normal  Communication   Communication: No difficulties  Cognition Arousal/Alertness: Awake/alert Behavior During Therapy: WFL for tasks assessed/performed Overall Cognitive Status: Within Functional Limits for tasks assessed  General Comments      Exercises     Assessment/Plan    PT Assessment Patient needs continued PT services  PT Problem List Decreased strength;Decreased mobility;Decreased balance;Decreased activity tolerance;Pain       PT Treatment Interventions DME instruction;Gait training;Therapeutic exercise;Balance training;Functional mobility training;Therapeutic  activities;Patient/family education    PT Goals (Current goals can be found in the Care Plan section)  Acute Rehab PT Goals Patient Stated Goal: less pain PT Goal Formulation: With patient Time For Goal Achievement: 10/26/20 Potential to Achieve Goals: Fair    Frequency Min 3X/week   Barriers to discharge        Co-evaluation               AM-PAC PT "6 Clicks" Mobility  Outcome Measure Help needed turning from your back to your side while in a flat bed without using bedrails?: A Little Help needed moving from lying on your back to sitting on the side of a flat bed without using bedrails?: A Little Help needed moving to and from a bed to a chair (including a wheelchair)?: A Little Help needed standing up from a chair using your arms (e.g., wheelchair or bedside chair)?: A Little Help needed to walk in hospital room?: A Little Help needed climbing 3-5 steps with a railing? : A Lot 6 Click Score: 17    End of Session   Activity Tolerance: Patient limited by pain;Patient limited by fatigue Patient left: in bed;with call bell/phone within reach   PT Visit Diagnosis: Pain;Muscle weakness (generalized) (M62.81)    Time: 2440-1027 PT Time Calculation (min) (ACUTE ONLY): 11 min   Charges:   PT Evaluation $PT Eval Moderate Complexity: 1 Mod             Doreatha Massed, PT Acute Rehabilitation  Office: [phone] Pager: [phone]

## 2020-10-12 NOTE — Progress Notes (Addendum)
[name] OFFICE PROGRESS NOTE  Patient Care Team: [name], MD as PCP - General (Family Medicine) [name], MD as Consulting Physician (Obstetrics and Gynecology) [name], RN as Oncology Nurse Navigator (Oncology)  I seen the patient, examined her and agree with documentation as follows  ASSESSMENT & PLAN:   Cervical cancer, FIGO stage IVB ([place]) I have reviewed multiple imaging studies with the patient and her husband Overall, she has positive response to treatment The radiologist commented on mixed response The slightly enlarged pulmonary nodule, in my opinion, does not constitute real progression I cannot explain the reason for new onset of right hydronephrosis but scarring near the cervix could contribute to this Her recent severe vaginal bleeding could be due to invasion into surrounding blood vessels, compounded by recent use of Avastin Overall, the patient tolerated treatment poorly due to pancytopenia She is getting weaker today with uncontrolled pain, not ready for discharge I plan to see her tomorrow morning and have further discussion about systemic treatment options In the meantime, continue supportive care  Vaginal bleeding/hemorrhage The patient received tranexamic acid No active bleeding at this time Monitor blood counts carefully She does not need transfusion support today If she had recurrent bleeding again, we can consider further palliative radiation   Pelvic pain/pressure Due to underlying malignancy The patient only has Tylenol ordered and I have reordered her prior dose of Dilaudid as needed I recommend scheduled Dilaudid by mouth.  She can still take Dilaudid intermittently as needed intravenously for breakthrough pain I will reassess pain control tomorrow  Bilateral hydronephrosis Status post right ureteral stent placement, unable to place stent on left side Renal function improved yesterday morning, not checked  today Follow renal function closely Overall, the left kidney is atrophied and likely contribute little kidney function overall I do not recommend percutaneous nephrostomy tube placement to the left side because the risk outweighs the benefit Foley catheter has been removed and she is voiding  Pancytopenia, acquired [facility]) The patient patient was profoundly anemic on admission Status post 3 units PRBCs on 4/23 and 1 unit on 4/25 Hemoglobin is 8.1 today Transfuse PRBCs to keep hemoglobin above 8 Platelets overall stable and will continue to monitor   SLE (systemic lupus erythematosus) ([place]) She has recent flare of lupus She received a course of prednisone 40 mg daily Monitor for recurrent flare    Hypokalemia She has severe hypokalemia likely due to poor appetite and her diuretic therapy Has been receiving intermittent aggressive IV potassium replacement Management per hospitalist  Constipation We will start her on aggressive laxative therapy She is in agreement for suppository  Profound weakness We will try to get her up and about if possible prior to discharge  Goals of care The patient has an incurable malignancy and has difficulty tolerating chemotherapy secondary to pancytopenia Has developed significant vaginal bleeding/hemorrhage which has now resolved Now with increased pelvic pain/pressure The patient is a limited code  Discharge planning Hemoglobin will need to remain stable and pain will need to be controlled before she can be discharged Anticipate that she will be hospitalized for at least another 2 to 3 days  All questions were answered. The patient knows to call the clinic with any problems, questions or concerns.   Mikey Bussing, NP 10/12/2020 9:13 AM Phyllis Hebert  INTERVAL HISTORY: The patient reports increased pelvic pain/pressure this morning Pain was worse when she got to the bathroom to void Currently only has Tylenol ordered but has  previously received  Dilaudid IV which was effective but overall, she has been needing round-the-clock pain medicine No vaginal bleeding reported this morning Denies nausea and vomiting She has been constipated since last Thursday She feels very weak and has poor appetite  SUMMARY OF ONCOLOGIC HISTORY: Oncology History Overview Note  PD-L1 CPS: 2   Malignant neoplasm of cervix ([place])  03/16/2020 Initial Diagnosis   The patient had history of new onset vaginal spotting on March 16, 2020.  She immediately called her OB/GYN's office and achieved an appointment with her OB/GYN's partner, Dr. Murrell Redden, who saw and evaluated the patient on March 19, 2020.  At that time a transvaginal ultrasound scan was performed which revealed a uterus measuring 5.3 x 8.4 x 4.6 cm with an ill-defined endometrium.  There is a complex cyst on the left ovary measuring 2.6 cm with peripheral blood flow noted.  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  Immunostains were positive for CEA and CDX2, negative for p16, ER, vimentin, and PAX8.  The differential diagnosis included primary gastric type endocervical adenocarcinoma (though this typically stains positive for PAX8) or a metastasis from pancreaticobiliary or upper GI primary.   03/29/2020 Pathology Results   1. Surgical [P], duodenum - PEPTIC DUODENITIS. - NO DYSPLASIA OR MALIGNANCY. 2. Surgical [P], gastric antrum and gastric body - REACTIVE GASTROPATHY. [place] IS NEGATIVE FOR HELICOBACTER PYLORI. - NO INTESTINAL METAPLASIA, DYSPLASIA, OR MALIGNANCY. 3. Surgical [P], gastric nodule - MILD REACTIVE GASTROPATHY. - NO INTESTINAL METAPLASIA, DYSPLASIA, OR MALIGNANCY. 4. Surgical [P], stomach, lesser curve ulcers - REACTIVE GASTROPATHY WITH EROSIONS. - NO INTESTINAL METAPLASIA, DYSPLASIA, OR MALIGNANCY. 5. Surgical [P], gastric polyps - HYPERPLASTIC  POLYP. - NO INTESTINAL METAPLASIA, DYSPLASIA OR MALIGNANCY. 6. Surgical [P], colon, descending, polyp - HYPERPLASTIC POLYP. - NO DYSPLASIA OR MALIGNANCY.   04/06/2020 Procedure   EGD report  Normal esophagus. - A few gastric polyps. Suspected fundic gland polyps. - Non-bleeding gastric ulcers with pigmented material. Biopsied. - A single gastric polyp/nodule. Resected and retrieved. - Erythematous duodenopathy. Biopsied. - The examination was otherwise normal.   04/06/2020 Procedure   Colonoscopy report - Non-bleeding internal hemorrhoids. - One 4 mm polyp in the descending colon, removed with a cold snare. Resected and retrieved. - The examination was otherwise normal on direct and retroflexion views   04/19/2020 PET scan   1. Hypermetabolic uterine cervix mass compatible with known primary cervical malignancy, with hypermetabolism extending into the upper third of the vagina and extending throughout the uterine body. No frank extrauterine extension. 2. Mild to moderate left hydroureteronephrosis to the level of the left UVJ, concerning for tumor involvement of the left UVJ. 3. Hypermetabolic right common iliac nodal metastasis. 4. Hypermetabolic sclerotic right ischial bone metastasis. 5. Two indeterminate small scattered solid pulmonary nodules, below PET resolution, warranting attention on chest CT follow-up in 3 months. 6.  Aortic Atherosclerosis (ICD10-I70.0).   04/19/2020 Cancer Staging   Staging form: Cervix Uteri, AJCC Version 9 - Clinical stage from 04/19/2020: FIGO Stage IVB (cT3b, cN1a, cM1) - Signed by [name], MD on 04/19/2020   04/26/2020 Procedure   Successful placement of a right IJ approach Power Port with ultrasound and fluoroscopic guidance. The catheter is ready for use   04/30/2020 - 05/21/2020 Chemotherapy   The patient had cisplatin for chemotherapy treatment.     06/20/2020 Genetic Testing   Negative genetic testing on the CancerNext-Expanded+RNAinsight  panel.  The CancerNext-Expanded gene panel offered by [facility] and includes sequencing and rearrangement analysis for the following 77 genes: AIP, ALK, APC*, ATM*, AXIN2, BAP1, BARD1, BLM, BMPR1A, BRCA1*, BRCA2*, BRIP1*, CDC73, CDH1*, CDK4, CDKN1B, CDKN2A, CHEK2*, CTNNA1, DICER1, FANCC, FH, FLCN, GALNT12, KIF1B, LZTR1, MAX, MEN1, MET, MLH1*, MSH2*, MSH3, MSH6*, MUTYH*, NBN, NF1*, NF2, NTHL1, PALB2*, PHOX2B, PMS2*, POT1, PRKAR1A, PTCH1, PTEN*, RAD51C*, RAD51D*, RB1, RECQL, RET, SDHA, SDHAF2, SDHB, SDHC, SDHD, SMAD4, SMARCA4, SMARCB1, SMARCE1, STK11, SUFU, TMEM127, TP53*, TSC1, TSC2, VHL and XRCC2 (sequencing and deletion/duplication); EGFR, EGLN1, HOXB13, KIT, MITF, PDGFRA, POLD1, and POLE (sequencing only); EPCAM and GREM1 (deletion/duplication only). DNA and RNA analyses performed for * genes. The report date is June 20, 2020.   07/27/2020 PET scan   1. Enlarging and new bilateral pulmonary nodules are evident today. Larger nodules in the right upper lung show FDG accumulation. Imaging features compatible with metastatic disease. 2. New hypermetabolic lymph nodes identified in the retrocaval space of the upper abdomen and para-aortic space of the lower abdomen, consistent with new metastatic involvement. There is some persistent hypermetabolism associated with small lymph nodes along the right common iliac chain. 3. Interval decrease in hypermetabolism associated with the cervix. 4. Persistent, mildly progressive left hydroureteronephrosis. Left ureter is dilated down to the level it passes the cervix, just proximal to the UVJ. 5. Decreased hypermetabolism in the previously identified right ischial tuberosity lesion consistent with metastatic disease.  6. New punctate focus of hypermetabolism in the gastric fundus without underlying mass lesion evident by CT. Attention on follow-up recommended. 7. Tiny gas bubble in the bladder lumen is presumably secondary to recent instrumentation although bladder  infection could have this appearance.   07/28/2020 -  Chemotherapy    Patient is on Treatment Plan: CERVICAL PEMBROLIZUMAB/CARBOPLATIN + PACLITAXEL + BEVACIZUMAB Q21D       10/04/2020 Imaging   1. There are multiple bilateral pulmonary nodules, generally subsolid in composition. The majority of nodules are diminished in size, or even completely resolved. A nodule of the dependent superior segment left lower lobe however appears to be slightly increased in size, measuring 1.2 x 0.9 cm, previously 0.9 x 0.9 cm when measured similarly. Findings are generally consistent with treatment response of pulmonary metastatic disease, however with some evidence of mixed response. 2. No significant interval change in previously FDG avid subcentimeter retroperitoneal lymph nodes, consistent with nodal metastatic disease. 3. Redemonstrated sclerotic osseous lesion of the right ischial tuberosity, not significant changed. No new osseous lesions identified by CT. 4. Circumferential soft tissue thickening about the cervix and lower uterine segment, overall dimensions approximately 5.5 x 3.5 cm, not significantly changed, consistent with primary cervical malignancy. There is loss of fat plane to the adjacent posterior urinary bladder wall and thickening of the bladder wall, suggesting direct invasion. 5. Perirectal and presacral soft tissue stranding, slightly increased compared to prior examination and consistent with developing radiation change. 6. Worsened, severe left hydronephrosis and new, moderate right hydronephrosis secondary to obstruction by cervical mass.     Metastasis to bone ([place])  04/19/2020 Initial Diagnosis   Metastasis to bone ([place])   07/28/2020 -  Chemotherapy    Patient is on Treatment Plan: CERVICAL PEMBROLIZUMAB/CARBOPLATIN + PACLITAXEL + BEVACIZUMAB Q21D         REVIEW OF SYSTEMS:   Constitutional: Denies fevers, chills  Eyes: Denies blurriness of vision Ears, nose, mouth, throat, and  face: Denies mucositis or sore throat Respiratory: Denies cough, dyspnea or wheezes Cardiovascular: Denies palpitation, chest discomfort or lower extremity  swelling Lymphatics: Denies new lymphadenopathy or easy bruising Neurological:Denies numbness, tingling or new weaknesses Behavioral/Psych: Mood is stable, no new changes  All other systems were reviewed with the patient and are negative.  I have reviewed the past medical history, past surgical history, social history and family history with the patient and they are unchanged from previous note.  ALLERGIES:  is allergic to penicillins.  MEDICATIONS:  Current Facility-Administered Medications  Medication Dose Route Frequency Provider Last Rate Last Admin   acetaminophen (TYLENOL) tablet 650 mg  650 mg Oral Q6H PRN Howerter, Justin B, DO   650 mg at 10/09/20 1832   Or   acetaminophen (TYLENOL) suppository 650 mg  650 mg Rectal Q6H PRN Howerter, Justin B, DO       Chlorhexidine Gluconate Cloth 2 % PADS 6 each  6 each Topical Daily Phillips Grout, MD   6 each at 19/14/78 2956   folic acid (FOLVITE) tablet 2 mg  2 mg Oral Daily Volanda Napoleon, MD   2 mg at 10/11/20 1008   HYDROmorphone (DILAUDID) injection 0.5 mg  0.5 mg Intravenous Q4H PRN Mikey Bussing R, NP   0.5 mg at 10/12/20 2130   lactated ringers infusion   Intravenous Continuous Howerter, Justin B, DO 75 mL/hr at 10/12/20 0346 Infusion Verify at 10/12/20 0346   LORazepam (ATIVAN) injection 0.5 mg  0.5 mg Intravenous Q6H PRN Howerter, Justin B, DO   0.5 mg at 10/09/20 2114   MEDLINE mouth rinse  15 mL Mouth Rinse BID Derrill Kay A, MD   15 mL at 10/11/20 2142   polyethylene glycol (MIRALAX / GLYCOLAX) packet 17 g  17 g Oral Daily Sani Madariaga, MD       prochlorperazine (COMPAZINE) injection 10 mg  10 mg Intravenous Q4H PRN Donne Hazel, MD       senna-docusate (Senokot-S) tablet 2 tablet  2 tablet Oral BID [name], MD   2 tablet at 10/11/20 2142    PHYSICAL  EXAMINATION: ECOG PERFORMANCE STATUS: 2 - Symptomatic, <50% confined to bed  Vitals:   10/11/20 2036 10/12/20 0533  BP: 136/63 (!) 163/80  Pulse: (!) 55 67  Resp: 16 18  Temp: 98.2 F (36.8 C) 98.1 F (36.7 C)  SpO2: 99% 99%   Filed Weights   10/10/20 0449 10/11/20 0549 10/12/20 0533  Weight: 100.1 kg 95.7 kg 99.1 kg    GENERAL:alert, no distress and comfortable.  She looks pale SKIN: No rashes ABD: Positive bowel sounds, soft, with pelvic fullness NEURO: alert & oriented x 3 with fluent speech, no focal motor/sensory deficits  LABORATORY DATA:  I have reviewed the data as listed    Component Value Date/Time   NA 141 10/12/2020 0500   NA 140 06/21/2016 1530   K 3.6 10/12/2020 0500   CL 111 10/12/2020 0500   CO2 25 10/12/2020 0500   GLUCOSE 99 10/12/2020 0500   BUN 17 10/12/2020 0500   BUN 16 06/21/2016 1530   CREATININE 1.05 (H) 10/12/2020 0500   CREATININE 1.38 (H) 10/01/2020 1429   CREATININE 0.87 07/02/2017 1549   CALCIUM 8.1 (L) 10/12/2020 0500   PROT 5.3 (L) 10/12/2020 0500   PROT 7.3 06/21/2016 1530   ALBUMIN 2.1 (L) 10/12/2020 0500   ALBUMIN 4.3 06/21/2016 1530   AST 19 10/12/2020 0500   AST 10 (L) 10/01/2020 1429   ALT 18 10/12/2020 0500   ALT 8 10/01/2020 1429   ALKPHOS 52 10/12/2020 0500   BILITOT 0.3 10/12/2020  0500   BILITOT 0.4 10/01/2020 1429   GFRNONAA 60 (L) 10/12/2020 0500   GFRNONAA 43 (L) 10/01/2020 1429   GFRAA 111 06/21/2016 1530    No results found for: SPEP, UPEP  Lab Results  Component Value Date   WBC 7.3 10/12/2020   NEUTROABS 7.3 10/08/2020   HGB 8.1 (L) 10/12/2020   HCT 24.8 (L) 10/12/2020   MCV 88.3 10/12/2020   PLT 48 (L) 10/12/2020      Chemistry      Component Value Date/Time   NA 141 10/12/2020 0500   NA 140 06/21/2016 1530   K 3.6 10/12/2020 0500   CL 111 10/12/2020 0500   CO2 25 10/12/2020 0500   BUN 17 10/12/2020 0500   BUN 16 06/21/2016 1530   CREATININE 1.05 (H) 10/12/2020 0500   CREATININE 1.38 (H)  10/01/2020 1429   CREATININE 0.87 07/02/2017 1549      Component Value Date/Time   CALCIUM 8.1 (L) 10/12/2020 0500   ALKPHOS 52 10/12/2020 0500   AST 19 10/12/2020 0500   AST 10 (L) 10/01/2020 1429   ALT 18 10/12/2020 0500   ALT 8 10/01/2020 1429   BILITOT 0.3 10/12/2020 0500   BILITOT 0.4 10/01/2020 1429       RADIOGRAPHIC STUDIES: I have reviewed multiple imaging studies with the patient and her husband I have personally reviewed the radiological images as listed and agreed with the findings in the report. CT CHEST ABDOMEN PELVIS W CONTRAST  Result Date: 10/03/2020 CLINICAL DATA:  Metastatic cervical cancer restaging EXAM: CT CHEST, ABDOMEN, AND PELVIS WITH CONTRAST TECHNIQUE: Multidetector CT imaging of the chest, abdomen and pelvis was performed following the standard protocol during bolus administration of intravenous contrast. CONTRAST:  22m ISOVUE-300 IOPAMIDOL (ISOVUE-300) INJECTION 61%, additional oral enteric contrast COMPARISON:  PET-CT, 07/27/2020, PET-CT, 04/19/2020 FINDINGS: CT CHEST FINDINGS Cardiovascular: Right chest port catheter. Normal heart size. No pericardial effusion. Mediastinum/Nodes: No enlarged mediastinal, hilar, or axillary lymph nodes. Thyroid gland, trachea, and esophagus demonstrate no significant findings. Lungs/Pleura: There are multiple bilateral pulmonary nodules, generally subsolid in composition. The majority of nodules are diminished in size, for example a nodule of the medial posterior right upper lobe which has been reduced to an elongated residual measuring approximately 0.9 x 0.3 cm, previously 0.9 x 0.6 cm (series 4, image 45). A small nodule of the central left upper lobe previously seen is almost entirely resolved, measuring no greater than 1-2 mm (series 4, image 60). A nodule of the dependent superior segment left lower lobe however appears to be slightly increased in size, measuring 1.2 x 0.9 cm, previously 0.9 x 0.9 cm when measured similarly  (series 4, image 47). No pleural effusion or pneumothorax. Musculoskeletal: No chest wall mass or suspicious bone lesions identified. CT ABDOMEN PELVIS FINDINGS Hepatobiliary: No solid liver abnormality is seen. No gallstones, gallbladder wall thickening, or biliary dilatation. Pancreas: Unremarkable. No pancreatic ductal dilatation or surrounding inflammatory changes. Spleen: Normal in size without significant abnormality. Adrenals/Urinary Tract: Adrenal glands are unremarkable. There is redemonstrated severe left hydronephrosis, worsened compared to prior examination, with relative atrophy and cortical hypoenhancement of the left kidney. There is new moderate right hydronephrosis and hydroureter. The distal ureters are obstructed in the pelvis by soft tissue about the uterine cervix (series 2, image 110). Thickening of the urinary bladder wall. Stomach/Bowel: Stomach is within normal limits. Appendix appears normal. No evidence of bowel wall thickening, distention, or inflammatory changes. Vascular/Lymphatic: No significant vascular findings are present. No significant interval  change in subcentimeter retroperitoneal lymph nodes, measuring up to 1.0 x 0.6 cm and previously FDG avid (series 2, image 76) Reproductive: Heterogeneous appearance and enhancement of the uterine fundus (series 8, image 112). Circumferential soft tissue thickening about the cervix and lower uterine segment, overall dimensions approximately 5.5 x 3.5 cm, not significantly changed (series 2, image 108). There is loss of fat plane to the adjacent posterior urinary bladder wall and thickening of the bladder wall. Perirectal and presacral soft tissue stranding, slightly increased compared to prior examination. Other: No abdominal wall hernia or abnormality. No abdominopelvic ascites. Musculoskeletal: Redemonstrated sclerotic osseous lesion of the right ischial tuberosity (series 2, image 119). IMPRESSION: 1. There are multiple bilateral  pulmonary nodules, generally subsolid in composition. The majority of nodules are diminished in size, or even completely resolved. A nodule of the dependent superior segment left lower lobe however appears to be slightly increased in size, measuring 1.2 x 0.9 cm, previously 0.9 x 0.9 cm when measured similarly. Findings are generally consistent with treatment response of pulmonary metastatic disease, however with some evidence of mixed response. 2. No significant interval change in previously FDG avid subcentimeter retroperitoneal lymph nodes, consistent with nodal metastatic disease. 3. Redemonstrated sclerotic osseous lesion of the right ischial tuberosity, not significant changed. No new osseous lesions identified by CT. 4. Circumferential soft tissue thickening about the cervix and lower uterine segment, overall dimensions approximately 5.5 x 3.5 cm, not significantly changed, consistent with primary cervical malignancy. There is loss of fat plane to the adjacent posterior urinary bladder wall and thickening of the bladder wall, suggesting direct invasion. 5. Perirectal and presacral soft tissue stranding, slightly increased compared to prior examination and consistent with developing radiation change. 6. Worsened, severe left hydronephrosis and new, moderate right hydronephrosis secondary to obstruction by cervical mass. Electronically Signed   By: Eddie Candle M.D.   On: 10/03/2020 18:27   DG C-Arm 1-60 Min-No Report  Result Date: 10/10/2020 Fluoroscopy was utilized by the requesting physician.  No radiographic interpretation.

## 2020-10-13 ENCOUNTER — Inpatient Hospital Stay: Payer: Managed Care, Other (non HMO) | Admitting: Hematology and Oncology

## 2020-10-13 ENCOUNTER — Inpatient Hospital Stay: Payer: Managed Care, Other (non HMO)

## 2020-10-13 DIAGNOSIS — E876 Hypokalemia: Secondary | ICD-10-CM

## 2020-10-13 DIAGNOSIS — D62 Acute posthemorrhagic anemia: Secondary | ICD-10-CM

## 2020-10-13 LAB — COMPREHENSIVE METABOLIC PANEL
ALT: 21 U/L (ref 0–44)
AST: 20 U/L (ref 15–41)
Albumin: 2.3 g/dL — ABNORMAL LOW (ref 3.5–5.0)
Alkaline Phosphatase: 57 U/L (ref 38–126)
Anion gap: 9 (ref 5–15)
BUN: 12 mg/dL (ref 8–23)
CO2: 26 mmol/L (ref 22–32)
Calcium: 8.4 mg/dL — ABNORMAL LOW (ref 8.9–10.3)
Chloride: 106 mmol/L (ref 98–111)
Creatinine, Ser: 0.97 mg/dL (ref 0.44–1.00)
GFR, Estimated: >60 mL/min (ref >60)
Glucose, Bld: 99 mg/dL (ref 70–99)
Potassium: 3.2 mmol/L — ABNORMAL LOW (ref 3.5–5.1)
Sodium: 141 mmol/L (ref 135–145)
Total Bilirubin: 0.5 mg/dL (ref 0.3–1.2)
Total Protein: 5.5 g/dL — ABNORMAL LOW (ref 6.5–8.1)

## 2020-10-13 LAB — CBC
HCT: 27.3 % — ABNORMAL LOW (ref 36.0–46.0)
Hemoglobin: 8.9 g/dL — ABNORMAL LOW (ref 12.0–15.0)
MCH: 28.6 pg (ref 26.0–34.0)
MCHC: 32.6 g/dL (ref 30.0–36.0)
MCV: 87.8 fL (ref 80.0–100.0)
Platelets: 57 10*3/uL — ABNORMAL LOW (ref 150–400)
RBC: 3.11 MIL/uL — ABNORMAL LOW (ref 3.87–5.11)
RDW: 17.7 % — ABNORMAL HIGH (ref 11.5–15.5)
WBC: 6.8 10*3/uL (ref 4.0–10.5)
nRBC: 0.3 % — ABNORMAL HIGH (ref 0.0–0.2)

## 2020-10-13 MED ORDER — HYDROMORPHONE HCL 1 MG/ML IJ SOLN
1.0000 mg | INTRAMUSCULAR | Status: DC | PRN
  Administered 2020-10-13 – 2020-10-14 (×3): 1 mg via INTRAVENOUS
  Filled 2020-10-13 (×3): qty 1

## 2020-10-13 MED ORDER — MORPHINE SULFATE ER 15 MG PO TBCR
15.0000 mg | EXTENDED_RELEASE_TABLET | Freq: Two times a day (BID) | ORAL | Status: DC
  Administered 2020-10-13 – 2020-10-14 (×3): 15 mg via ORAL
  Filled 2020-10-13 (×3): qty 1

## 2020-10-13 MED ORDER — TRAMADOL HCL 50 MG PO TABS: 50.0000 mg | ORAL_TABLET | Freq: Four times a day (QID) | ORAL | Status: DC | PRN

## 2020-10-13 MED ORDER — PREDNISONE 20 MG PO TABS
20.0000 mg | ORAL_TABLET | Freq: Every day | ORAL | Status: DC
  Administered 2020-10-13 – 2020-10-14 (×2): 20 mg via ORAL
  Filled 2020-10-13 (×2): qty 1

## 2020-10-13 NOTE — Progress Notes (Signed)
Phyllis Hebert   DOB:03-02-1956   YP#:[id number]    ASSESSMENT & PLAN:  Cervical cancer, FIGO stage IVB ([place]) We have a long discussion today about next step She is getting weaker Despite not receiving treatment since September 07, 2020, she is still profoundly pancytopenic Certainly, a component of the pancytopenia is related to recent bleeding and consumption but bone marrow infiltration causing worsening pancytopenia cannot be excluded Her recent vaginal bleeding and abnormal appearance of her bladder are also worrisome for disease progression Based on her current state of health, performance status and blood work, she is not a candidate to proceed with further treatment I printed a copy of the latest NCCN guidelines and shared with the patient and her husband For now, we can only focus on supportive care She has appointment in the outpatient clinic next week If her blood counts does not improve by next week, it is unlikely that the patient can proceed with further palliative chemotherapy  Vaginal bleeding/hemorrhage, resolved The patient received tranexamic acid No active bleeding at this time Monitor blood counts carefully She does not need transfusion support today If she had recurrent bleeding again, we can consider further palliative radiation  Pelvic pain/pressure, due to underlying malignancy She has severe bone pain and have difficulties getting a comfortable position I will add MS Contin and continue current dose of Dilaudid I will also add low-dose prednisone for cancer associated bone pain She can continue IV Dilaudid as needed We discussed the risk and benefits of further imaging study 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 and I suspect her worsening pain is related to disease progression We also discussed the risk and benefits of bone marrow biopsy At this point in time, we are only going to focus on pain control  Bilateral hydronephrosis Status  post right ureteral stent placement, unable to place stent on left side Renal function improved yesterday morning, not checked today Follow renal function closely Overall, the left kidney is atrophied and likely contribute little kidney function overall I do not recommend percutaneous nephrostomy tube placement to the left side because the risk outweighs the benefit Foley catheter has been removed and she is voiding  Pancytopenia, acquired ([place]) The patient patient was profoundly anemic on admission Status post 3 units PRBCs on 4/23 and 1 unit on 4/25 Hemoglobin is 8.1 today Transfuse PRBCs to keep hemoglobin above 8 Platelets overall stable and will continue to monitor  SLE (systemic lupus erythematosus) ([place]) She has recent flare of lupus She received a course of prednisone 40 mg daily She has poor appetite I will reintroduce 20 mg prednisone daily Monitor for recurrent flare  Hypokalemia She has severe hypokalemia likely due to poor appetite and her diuretic therapy Has been receiving intermittent aggressive IV potassium replacement Management per hospitalist  Constipation, resolved She will continue aggressive laxative therapy  Profound weakness We will try to get her up and about if possible prior to discharge  Goals of care The patient has an incurable malignancy and has difficulty tolerating chemotherapy secondary to pancytopenia Has developed significant vaginal bleeding/hemorrhage which has now resolved Now with increased pelvic pain/pressure The patient is a limited code The patient and family is aware of poor prognosis after much discussion today  Discharge planning I am hopeful if her pain is reasonably controlled by tomorrow, she can be discharged tomorrow I recommend the patient to focus on oral intake if possible and to get up of her bed once her  pain is better controlled  All questions were answered. The patient knows to call the clinic with any  problems, questions or concerns.   [name], MD 10/13/2020 9:11 AM  Subjective:  Since last time I saw her, she had multiple small bowel movement The bowel movement did not improve her pain She has significant pain in the groin and her sacrum region The pain is causing difficulties with position and even sitting on her bedside commode causes pain The schedule oral Dilaudid help a little Her intake is slightly better but not much improved She still have difficulties urinating The patient denies any recent signs or symptoms of bleeding such as spontaneous epistaxis, hematuria or hematochezia. Her husband is by the bedside and her daughter, Phyllis Hebert is on the phone throughout the entire visit  Objective:  Vitals:   10/12/20 2225 10/13/20 0506  BP: (!) 152/83 (!) 152/79  Pulse: 72 78  Resp: 14 14  Temp: 99.2 F (37.3 C) 98.8 F (37.1 C)  SpO2: 98% 97%     Intake/Output Summary (Last 24 hours) at 10/13/2020 6606 Last data filed at 10/13/2020 0500 Gross per 24 hour  Intake 360 ml  Output 1300 ml  Net -940 ml    GENERAL:alert, looks pale and in somewhat distress from uncontrolled pain NEURO: alert & oriented x 3 with fluent speech, no focal motor/sensory deficits   Labs:  Recent Labs    10/10/20 0510 10/12/20 0500 10/13/20 0509  NA 140 141 141  K 3.6 3.6 3.2*  CL 109 111 106  CO2 25 25 26   GLUCOSE 119* 99 99  BUN 17 17 12   CREATININE 1.10* 1.05* 0.97  CALCIUM 7.8* 8.1* 8.4*  GFRNONAA 56* 60* >60  PROT 4.9* 5.3* 5.5*  ALBUMIN 2.3* 2.1* 2.3*  AST 12* 19 20  ALT 11 18 21   ALKPHOS 53 52 57  BILITOT 0.7 0.3 0.5    Studies:  CT CHEST ABDOMEN PELVIS W CONTRAST  Result Date: 10/03/2020 CLINICAL DATA:  Metastatic cervical cancer restaging EXAM: CT CHEST, ABDOMEN, AND PELVIS WITH CONTRAST TECHNIQUE: Multidetector CT imaging of the chest, abdomen and pelvis was performed following the standard protocol during bolus administration of intravenous contrast. CONTRAST:  23m  ISOVUE-300 IOPAMIDOL (ISOVUE-300) INJECTION 61%, additional oral enteric contrast COMPARISON:  PET-CT, 07/27/2020, PET-CT, 04/19/2020 FINDINGS: CT CHEST FINDINGS Cardiovascular: Right chest port catheter. Normal heart size. No pericardial effusion. Mediastinum/Nodes: No enlarged mediastinal, hilar, or axillary lymph nodes. Thyroid gland, trachea, and esophagus demonstrate no significant findings. Lungs/Pleura: There are multiple bilateral pulmonary nodules, generally subsolid in composition. The majority of nodules are diminished in size, for example a nodule of the medial posterior right upper lobe which has been reduced to an elongated residual measuring approximately 0.9 x 0.3 cm, previously 0.9 x 0.6 cm (series 4, image 45). A small nodule of the central left upper lobe previously seen is almost entirely resolved, measuring no greater than 1-2 mm (series 4, image 60). A nodule of the dependent superior segment left lower lobe however appears to be slightly increased in size, measuring 1.2 x 0.9 cm, previously 0.9 x 0.9 cm when measured similarly (series 4, image 47). No pleural effusion or pneumothorax. Musculoskeletal: No chest wall mass or suspicious bone lesions identified. CT ABDOMEN PELVIS FINDINGS Hepatobiliary: No solid liver abnormality is seen. No gallstones, gallbladder wall thickening, or biliary dilatation. Pancreas: Unremarkable. No pancreatic ductal dilatation or surrounding inflammatory changes. Spleen: Normal in size without significant abnormality. Adrenals/Urinary Tract: Adrenal glands are unremarkable. There  is redemonstrated severe left hydronephrosis, worsened compared to prior examination, with relative atrophy and cortical hypoenhancement of the left kidney. There is new moderate right hydronephrosis and hydroureter. The distal ureters are obstructed in the pelvis by soft tissue about the uterine cervix (series 2, image 110). Thickening of the urinary bladder wall. Stomach/Bowel: Stomach  is within normal limits. Appendix appears normal. No evidence of bowel wall thickening, distention, or inflammatory changes. Vascular/Lymphatic: No significant vascular findings are present. No significant interval change in subcentimeter retroperitoneal lymph nodes, measuring up to 1.0 x 0.6 cm and previously FDG avid (series 2, image 76) Reproductive: Heterogeneous appearance and enhancement of the uterine fundus (series 8, image 112). Circumferential soft tissue thickening about the cervix and lower uterine segment, overall dimensions approximately 5.5 x 3.5 cm, not significantly changed (series 2, image 108). There is loss of fat plane to the adjacent posterior urinary bladder wall and thickening of the bladder wall. Perirectal and presacral soft tissue stranding, slightly increased compared to prior examination. Other: No abdominal wall hernia or abnormality. No abdominopelvic ascites. Musculoskeletal: Redemonstrated sclerotic osseous lesion of the right ischial tuberosity (series 2, image 119). IMPRESSION: 1. There are multiple bilateral pulmonary nodules, generally subsolid in composition. The majority of nodules are diminished in size, or even completely resolved. A nodule of the dependent superior segment left lower lobe however appears to be slightly increased in size, measuring 1.2 x 0.9 cm, previously 0.9 x 0.9 cm when measured similarly. Findings are generally consistent with treatment response of pulmonary metastatic disease, however with some evidence of mixed response. 2. No significant interval change in previously FDG avid subcentimeter retroperitoneal lymph nodes, consistent with nodal metastatic disease. 3. Redemonstrated sclerotic osseous lesion of the right ischial tuberosity, not significant changed. No new osseous lesions identified by CT. 4. Circumferential soft tissue thickening about the cervix and lower uterine segment, overall dimensions approximately 5.5 x 3.5 cm, not significantly  changed, consistent with primary cervical malignancy. There is loss of fat plane to the adjacent posterior urinary bladder wall and thickening of the bladder wall, suggesting direct invasion. 5. Perirectal and presacral soft tissue stranding, slightly increased compared to prior examination and consistent with developing radiation change. 6. Worsened, severe left hydronephrosis and new, moderate right hydronephrosis secondary to obstruction by cervical mass. Electronically Signed   By: Eddie Candle M.D.   On: 10/03/2020 18:27   DG C-Arm 1-60 Min-No Report  Result Date: 10/10/2020 Fluoroscopy was utilized by the requesting physician.  No radiographic interpretation.

## 2020-10-13 NOTE — Progress Notes (Addendum)
 PROGRESS NOTE    Phyllis Hebert  MRN:[id number] DOB: 03/04/1956 DOA: 10/07/2020 PCP: Tabori, Katherine E, MD   Brief Narrative: Phyllis Hebert is a 65 y.o. female with history of stage IV cervical cancer currently receiving chemotherapy, cutaneous lupus erythematosus, primary hypertension, anxiety, hyperlipidemia.  Patient presented secondary to vaginal bleeding in setting of known cervical cancer requiring a total of 4 units of PRBC during hospitalization.  Medical oncology was consulted for assistance in management.  Hospitalization complicated by acute on chronic pain with difficulty managing.  Bleeding has improved however pain is still uncontrolled.   Assessment & Plan:   Principal Problem:   Vaginal bleeding Active Problems:   Cutaneous lupus erythematosus   Malignant neoplasm of cervix (HCC)   Essential hypertension   Hypomagnesemia   Hypokalemia   Vaginal bleeding Secondary to known cervical cancer.  Medical oncology was consulted and treated patient with tranexamic acid.  Vaginal bleeding currently resolved.  Acute blood loss anemia Acute on chronic anemia Acute anemia secondary to above.  Baseline hemoglobin of about 10. Hemoglobin of 3.8 on admission. Patient has required 4 units of PRBC to date.  Hemoglobin is currently stable.  Thrombocytopenia Stable. In setting of known cancer. Medical oncology considering bone marrow biopsy which will be deferred at this time.  Bilateral hydronephrosis CT abdomen/pelvis significant for severe left hydronephrosis with developing right-sided hydronephrosis.  Foley was placed.  Urology was also consulted.  Patient underwent right ureteral stent placement but was unable to have left stent placed.  Consideration for left nephrostomy tube, however this was decided against.  Creatinine is stable.  Neurology recommending follow-up in 2 and half months.  Foley catheter was removed.    Acute on chronic pain Medical oncology managing  pain with narcotic regimen. -Medical oncology recommendations: MS Contin 50 mg twice daily, hydromorphone 2 mg p.o. 3 times daily scheduled, prednisone 20 mg daily, hydromorphone 1 mg IV every 4 hours as needed severe pain -Continue Senokot-S and Miralax scheduled  Cervical cancer Patient follows with Dr. Gorsuch as an outpatient.  Cutaneous lupus erythematosus Stable  Hypomagnesemia Hypokalemia Supplementation given as needed.   DVT prophylaxis: SCDs Code Status:   Code Status: Partial Code Family Communication: Husband at bedside Disposition Plan: Discharge hopefully in 1 to 2 days if pain is better controlled.   Consultants:   Medical oncology  Procedures:   None  Antimicrobials:  None   Subjective: Patient reports significant pain which is mostly felt in her back area/pelvic area.  Pain is worse when she moves so she tries to stay still.  She is hopeful that her current pain regimen will improve symptoms.   Objective: Vitals:   10/12/20 1417 10/12/20 2225 10/13/20 0506 10/13/20 1418  BP: 139/72 (!) 152/83 (!) 152/79 (!) 166/79  Pulse: 75 72 78 66  Resp: 18 14 14 16  Temp: 98 F (36.7 C) 99.2 F (37.3 C) 98.8 F (37.1 C) 98.8 F (37.1 C)  TempSrc: Oral Oral Oral Oral  SpO2: 98% 98% 97% 95%  Weight:      Height:        Intake/Output Summary (Last 24 hours) at 10/13/2020 1452 Last data filed at 10/13/2020 0500 Gross per 24 hour  Intake --  Output 600 ml  Net -600 ml   Filed Weights   10/10/20 0449 10/11/20 0549 10/12/20 0533  Weight: 100.1 kg 95.7 kg 99.1 kg    Examination:  General exam: Appears calm and uncomfortable Respiratory system: Clear to auscultation. Respiratory   effort normal. Cardiovascular system: S1 & S2 heard, RRR. No murmurs, rubs, gallops or clicks. Gastrointestinal system: Abdomen is nondistended, soft and nontender. No organomegaly or masses felt. Normal bowel sounds heard. Central nervous system: Alert and oriented. No focal  neurological deficits. Musculoskeletal: No calf tenderness Skin: No cyanosis. No rashes Psychiatry: Judgement and insight appear normal. Mood & affect appropriate.     Data Reviewed: I have personally reviewed following labs and imaging studies  CBC Lab Results  Component Value Date   WBC 6.8 10/13/2020   RBC 3.11 (L) 10/13/2020   HGB 8.9 (L) 10/13/2020   HCT 27.3 (L) 10/13/2020   MCV 87.8 10/13/2020   MCH 28.6 10/13/2020   PLT 57 (L) 10/13/2020   MCHC 32.6 10/13/2020   RDW 17.7 (H) 10/13/2020   LYMPHSABS 0.6 (L) 10/08/2020   MONOABS 0.7 10/08/2020   EOSABS 0.0 10/08/2020   BASOSABS 0.0 81/06/7508     Last metabolic panel Lab Results  Component Value Date   NA 141 10/13/2020   K 3.2 (L) 10/13/2020   CL 106 10/13/2020   CO2 26 10/13/2020   BUN 12 10/13/2020   CREATININE 0.97 10/13/2020   GLUCOSE 99 10/13/2020   GFRNONAA >60 10/13/2020   GFRAA 111 06/21/2016   CALCIUM 8.4 (L) 10/13/2020   PROT 5.5 (L) 10/13/2020   ALBUMIN 2.3 (L) 10/13/2020   BILITOT 0.5 10/13/2020   ALKPHOS 57 10/13/2020   AST 20 10/13/2020   ALT 21 10/13/2020   ANIONGAP 9 10/13/2020    CBG (last 3)  No results for input(s): GLUCAP in the last 72 hours.   GFR: Estimated Creatinine Clearance: 69.2 mL/min (by C-G formula based on SCr of 0.97 mg/dL).  Coagulation Profile: Recent Labs  Lab 10/08/20 0225  INR 1.0    Recent Results (from the past 240 hour(s))  Resp Panel by RT-PCR (Flu A&B, Covid) Nasopharyngeal Swab     Status: None   Collection Time: 10/08/20  2:32 AM   Specimen: Nasopharyngeal Swab; Nasopharyngeal(NP) swabs in vial transport medium  Result Value Ref Range Status   SARS Coronavirus 2 by RT PCR NEGATIVE NEGATIVE Final    Comment: (NOTE) SARS-CoV-2 target nucleic acids are NOT DETECTED.  The SARS-CoV-2 RNA is generally detectable in upper respiratory specimens during the acute phase of infection. The lowest concentration of SARS-CoV-2 viral copies this assay can  detect is 138 copies/mL. A negative result does not preclude SARS-Cov-2 infection and should not be used as the sole basis for treatment or other patient management decisions. A negative result may occur with  improper specimen collection/handling, submission of specimen other than nasopharyngeal swab, presence of viral mutation(s) within the areas targeted by this assay, and inadequate number of viral copies(<138 copies/mL). A negative result must be combined with clinical observations, patient history, and epidemiological information. The expected result is Negative.  Fact Sheet for Patients:  EntrepreneurPulse.com.au  Fact Sheet for Healthcare Providers:  IncredibleEmployment.be  This test is no t yet approved or cleared by the [place] FDA and  has been authorized for detection and/or diagnosis of SARS-CoV-2 by FDA under an Emergency Use Authorization (EUA). This EUA will remain  in effect (meaning this test can be used) for the duration of the COVID-19 declaration under Section 564(b)(1) of the Act, 21 U.S.C.section 360bbb-3(b)(1), unless the authorization is terminated  or revoked sooner.       Influenza A by PCR NEGATIVE NEGATIVE Final   Influenza B by PCR NEGATIVE NEGATIVE Final  Comment: (NOTE) The Xpert Xpress SARS-CoV-2/FLU/RSV plus assay is intended as an aid in the diagnosis of influenza from Nasopharyngeal swab specimens and should not be used as a sole basis for treatment. Nasal washings and aspirates are unacceptable for Xpert Xpress SARS-CoV-2/FLU/RSV testing.  Fact Sheet for Patients: https://www.fda.gov/media/152166/download  Fact Sheet for Healthcare Providers: https://www.fda.gov/media/152162/download  This test is not yet approved or cleared by the [place] FDA and has been authorized for detection and/or diagnosis of SARS-CoV-2 by FDA under an Emergency Use Authorization (EUA). This EUA will remain in  effect (meaning this test can be used) for the duration of the COVID-19 declaration under Section 564(b)(1) of the Act, 21 U.S.C. section 360bbb-3(b)(1), unless the authorization is terminated or revoked.  Performed at [facility], [address] [place], [place] [address]         Radiology Studies: No results found.      Scheduled Meds: . Chlorhexidine Gluconate Cloth  6 each Topical Daily  . folic acid  2 mg Oral Daily  . HYDROmorphone  2 mg Oral TID  . mouth rinse  15 mL Mouth Rinse BID  . morphine  15 mg Oral Q12H  . polyethylene glycol  17 g Oral Daily  . predniSONE  20 mg Oral Q breakfast  . senna-docusate  2 tablet Oral BID   Continuous Infusions: . lactated ringers 50 mL/hr at 10/13/20 0930     LOS: 5 days     Ralph Nettey, MD [facility] 10/13/2020, 2:52 PM  If 7PM-7AM, please contact night-coverage [email or web address] 

## 2020-10-14 ENCOUNTER — Other Ambulatory Visit: Payer: Self-pay | Admitting: Hematology and Oncology

## 2020-10-14 ENCOUNTER — Other Ambulatory Visit (HOSPITAL_COMMUNITY): Payer: Self-pay

## 2020-10-14 DIAGNOSIS — R5381 Other malaise: Secondary | ICD-10-CM | POA: Insufficient documentation

## 2020-10-14 DIAGNOSIS — C7951 Secondary malignant neoplasm of bone: Secondary | ICD-10-CM

## 2020-10-14 DIAGNOSIS — N1330 Unspecified hydronephrosis: Secondary | ICD-10-CM

## 2020-10-14 DIAGNOSIS — C531 Malignant neoplasm of exocervix: Secondary | ICD-10-CM

## 2020-10-14 LAB — COMPREHENSIVE METABOLIC PANEL
ALT: 18 U/L (ref 0–44)
AST: 15 U/L (ref 15–41)
Albumin: 2.5 g/dL — ABNORMAL LOW (ref 3.5–5.0)
Alkaline Phosphatase: 61 U/L (ref 38–126)
Anion gap: 10 (ref 5–15)
BUN: 11 mg/dL (ref 8–23)
CO2: 26 mmol/L (ref 22–32)
Calcium: 9.1 mg/dL (ref 8.9–10.3)
Chloride: 105 mmol/L (ref 98–111)
Creatinine, Ser: 0.87 mg/dL (ref 0.44–1.00)
GFR, Estimated: >60 mL/min (ref >60)
Glucose, Bld: 128 mg/dL — ABNORMAL HIGH (ref 70–99)
Potassium: 3.4 mmol/L — ABNORMAL LOW (ref 3.5–5.1)
Sodium: 141 mmol/L (ref 135–145)
Total Bilirubin: 0.4 mg/dL (ref 0.3–1.2)
Total Protein: 6 g/dL — ABNORMAL LOW (ref 6.5–8.1)

## 2020-10-14 LAB — CBC WITH DIFFERENTIAL/PLATELET
Abs Immature Granulocytes: 0.14 10*3/uL — ABNORMAL HIGH (ref 0.00–0.07)
Basophils Absolute: 0 10*3/uL (ref 0.0–0.1)
Basophils Relative: 0 %
Eosinophils Absolute: 0.1 10*3/uL (ref 0.0–0.5)
Eosinophils Relative: 1 %
HCT: 29.8 % — ABNORMAL LOW (ref 36.0–46.0)
Hemoglobin: 9.6 g/dL — ABNORMAL LOW (ref 12.0–15.0)
Immature Granulocytes: 1 %
Lymphocytes Relative: 5 %
Lymphs Abs: 0.6 10*3/uL — ABNORMAL LOW (ref 0.7–4.0)
MCH: 28.4 pg (ref 26.0–34.0)
MCHC: 32.2 g/dL (ref 30.0–36.0)
MCV: 88.2 fL (ref 80.0–100.0)
Monocytes Absolute: 0.6 10*3/uL (ref 0.1–1.0)
Monocytes Relative: 5 %
Neutro Abs: 9.5 10*3/uL — ABNORMAL HIGH (ref 1.7–7.7)
Neutrophils Relative %: 88 %
Platelets: 71 10*3/uL — ABNORMAL LOW (ref 150–400)
RBC: 3.38 MIL/uL — ABNORMAL LOW (ref 3.87–5.11)
RDW: 17.5 % — ABNORMAL HIGH (ref 11.5–15.5)
WBC: 10.9 10*3/uL — ABNORMAL HIGH (ref 4.0–10.5)
nRBC: 0.2 % (ref 0.0–0.2)

## 2020-10-14 MED ORDER — HYDROMORPHONE HCL 2 MG PO TABS
2.0000 mg | ORAL_TABLET | ORAL | 0 refills | Status: DC | PRN
  Filled 2020-10-14: qty 40, 7d supply, fill #0

## 2020-10-14 MED ORDER — POLYETHYLENE GLYCOL 3350 17 G PO PACK
17.0000 g | PACK | Freq: Every day | ORAL | 0 refills | Status: AC
Start: 2020-10-15 — End: ?
  Filled 2020-10-14: qty 14, 14d supply, fill #0

## 2020-10-14 MED ORDER — FOLIC ACID 1 MG PO TABS: 2.0000 mg | ORAL_TABLET | Freq: Every day | ORAL | Status: DC

## 2020-10-14 MED ORDER — SENNOSIDES-DOCUSATE SODIUM 8.6-50 MG PO TABS
2.0000 | ORAL_TABLET | Freq: Two times a day (BID) | ORAL | 0 refills | Status: AC
  Filled 2020-10-14: qty 30, 8d supply, fill #0

## 2020-10-14 MED ORDER — MORPHINE SULFATE ER 15 MG PO TBCR
15.0000 mg | EXTENDED_RELEASE_TABLET | Freq: Two times a day (BID) | ORAL | 0 refills | Status: DC
  Filled 2020-10-14: qty 30, 15d supply, fill #0

## 2020-10-14 MED ORDER — POTASSIUM CHLORIDE CRYS ER 20 MEQ PO TBCR
40.0000 meq | EXTENDED_RELEASE_TABLET | Freq: Once | ORAL | Status: AC
  Administered 2020-10-14: 40 meq via ORAL
  Filled 2020-10-14: qty 2

## 2020-10-14 NOTE — Discharge Instructions (Signed)
Phyllis Hebert,  You were in the hospital with vaginal bleeding and severe pain related to your cancer. These were both treated adequately and you are now stable for discharge. Please continue to manage your pain with your updated analgesic regimen and follow-up with Dr. Alvy Bimler as an outpatient.

## 2020-10-14 NOTE — Progress Notes (Addendum)
Phyllis Hebert   DOB:[birth year]   CV#:[id number]    I have seen the patient, examined her and agree with documentation as follows ASSESSMENT & PLAN:  Cervical cancer, FIGO stage IVB ([place]) We have a long discussion today about next step She is getting weaker Despite not receiving treatment since September 07, 2020, she is still profoundly pancytopenic Certainly, a component of the pancytopenia is related to recent bleeding and consumption but bone marrow infiltration causing worsening pancytopenia cannot be excluded Her recent vaginal bleeding and abnormal appearance of her bladder are also worrisome for disease progression Based on her current state of health, performance status and blood work, she is not a candidate to proceed with further treatment I printed a copy of the latest NCCN guidelines and shared with the patient and her husband yesterday For now, we can only focus on supportive care She will follow up at the [facility] on 10/20/20 at 12pm. If her blood counts does not improve by next week, it is unlikely that the patient can proceed with further palliative chemotherapy  Vaginal bleeding/hemorrhage, resolved The patient received tranexamic acid No active bleeding at this time Monitor blood counts carefully CBC from today is pending If she had recurrent bleeding again, we can consider further palliative radiation   Pelvic pain/pressure, due to underlying malignancy Pain is significantly better this morning She is on MS Contin and oral Dilaudid and we plan to continue these medications She was also started on low-dose prednisone yesterday and that was effective to control her pain We discussed the risk and benefits of further imaging study 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 and I suspect her worsening pain is related to disease progression We also discussed the risk and benefits of bone marrow biopsy At this point in time, we are only going to focus on  pain control Her prescription of MS Contin and oral Dilaudid has been sent to [facility] outpatient pharmacy for pickup this afternoon  Bilateral hydronephrosis Status post right ureteral stent placement, unable to place stent on left side Renal function improved yesterday morning, not checked today Follow renal function closely Overall, the left kidney is atrophied and likely contribute little kidney function overall I do not recommend percutaneous nephrostomy tube placement to the left side because the risk outweighs the benefit Foley catheter has been removed and she is voiding   Pancytopenia, acquired [facility]) The patient patient was profoundly anemic on admission Status post 3 units PRBCs on 4/23 and 1 unit on 4/25 Hemoglobin is pending today Transfuse PRBCs to keep hemoglobin above 8 Platelets have been stable overall we will continue to monitor   SLE (systemic lupus erythematosus) ([place]) She has recent flare of lupus She received a course of prednisone 40 mg daily She has poor appetite I will reintroduce 20 mg prednisone daily Monitor for recurrent flare    Hypokalemia She has severe hypokalemia likely due to poor appetite and her diuretic therapy Has been receiving intermittent aggressive IV potassium replacement Management per hospitalist   Constipation, resolved She will continue aggressive laxative therapy  Profound weakness We will try to get her up and about if possible prior to discharge   Goals of care The patient has an incurable malignancy and has difficulty tolerating chemotherapy secondary to pancytopenia Has developed significant vaginal bleeding/hemorrhage which has now resolved Pelvic pain/pressure has improved The patient is a limited code The patient and family is aware of poor prognosis after discussion  Discharge planning Pain is reasonably well controlled at this time with MS Contin and oral Dilaudid Repeat labs are pending and if stable, she may  be discharged home She will follow-up at the cancer center on 10/20/2020 for labs at 1130 and visit at 12:00.  She is to arrive at 1115.  All questions were answered. The patient knows to call the clinic with any problems, questions or concerns.   Mikey Bussing, NP 10/14/2020 9:50 AM Phyllis Lark, MD Subjective:  Pain is overall better controlled this morning Still has some intermittent pelvic pressure Having small bowel movements Denies recurrent bleeding such as spontaneous epistaxis, hematuria, or hematochezia. Her intake is slightly better but not much improved  Objective:  Vitals:   10/14/20 0527 10/14/20 0830  BP: (!) 146/74 127/66  Pulse: 72 79  Resp: 19 17  Temp: 98.7 F (37.1 C) (!) 97.4 F (36.3 C)  SpO2: 97% 95%     Intake/Output Summary (Last 24 hours) at 10/14/2020 0950 Last data filed at 10/13/2020 1837 Gross per 24 hour  Intake 360 ml  Output 2000 ml  Net -1640 ml    GENERAL:alert, looks pale and in somewhat distress from uncontrolled pain ABD: Positive bowel sounds, soft, nontender NEURO: alert & oriented x 3 with fluent speech, no focal motor/sensory deficits   Labs:  Recent Labs    10/10/20 0510 10/12/20 0500 10/13/20 0509  NA 140 141 141  K 3.6 3.6 3.2*  CL 109 111 106  CO2 25 25 26   GLUCOSE 119* 99 99  BUN 17 17 12   CREATININE 1.10* 1.05* 0.97  CALCIUM 7.8* 8.1* 8.4*  GFRNONAA 56* 60* >60  PROT 4.9* 5.3* 5.5*  ALBUMIN 2.3* 2.1* 2.3*  AST 12* 19 20  ALT 11 18 21   ALKPHOS 53 52 57  BILITOT 0.7 0.3 0.5    Studies:  CT CHEST ABDOMEN PELVIS W CONTRAST  Result Date: 10/03/2020 CLINICAL DATA:  Metastatic cervical cancer restaging EXAM: CT CHEST, ABDOMEN, AND PELVIS WITH CONTRAST TECHNIQUE: Multidetector CT imaging of the chest, abdomen and pelvis was performed following the standard protocol during bolus administration of intravenous contrast. CONTRAST:  24m ISOVUE-300 IOPAMIDOL (ISOVUE-300) INJECTION 61%, additional oral enteric contrast  COMPARISON:  PET-CT, 07/27/2020, PET-CT, 04/19/2020 FINDINGS: CT CHEST FINDINGS Cardiovascular: Right chest port catheter. Normal heart size. No pericardial effusion. Mediastinum/Nodes: No enlarged mediastinal, hilar, or axillary lymph nodes. Thyroid gland, trachea, and esophagus demonstrate no significant findings. Lungs/Pleura: There are multiple bilateral pulmonary nodules, generally subsolid in composition. The majority of nodules are diminished in size, for example a nodule of the medial posterior right upper lobe which has been reduced to an elongated residual measuring approximately 0.9 x 0.3 cm, previously 0.9 x 0.6 cm (series 4, image 45). A small nodule of the central left upper lobe previously seen is almost entirely resolved, measuring no greater than 1-2 mm (series 4, image 60). A nodule of the dependent superior segment left lower lobe however appears to be slightly increased in size, measuring 1.2 x 0.9 cm, previously 0.9 x 0.9 cm when measured similarly (series 4, image 47). No pleural effusion or pneumothorax. Musculoskeletal: No chest wall mass or suspicious bone lesions identified. CT ABDOMEN PELVIS FINDINGS Hepatobiliary: No solid liver abnormality is seen. No gallstones, gallbladder wall thickening, or biliary dilatation. Pancreas: Unremarkable. No pancreatic ductal dilatation or surrounding inflammatory changes. Spleen: Normal in size without significant abnormality. Adrenals/Urinary Tract: Adrenal glands are unremarkable. There is redemonstrated severe left hydronephrosis, worsened compared to prior examination, with  relative atrophy and cortical hypoenhancement of the left kidney. There is new moderate right hydronephrosis and hydroureter. The distal ureters are obstructed in the pelvis by soft tissue about the uterine cervix (series 2, image 110). Thickening of the urinary bladder wall. Stomach/Bowel: Stomach is within normal limits. Appendix appears normal. No evidence of bowel wall  thickening, distention, or inflammatory changes. Vascular/Lymphatic: No significant vascular findings are present. No significant interval change in subcentimeter retroperitoneal lymph nodes, measuring up to 1.0 x 0.6 cm and previously FDG avid (series 2, image 76) Reproductive: Heterogeneous appearance and enhancement of the uterine fundus (series 8, image 112). Circumferential soft tissue thickening about the cervix and lower uterine segment, overall dimensions approximately 5.5 x 3.5 cm, not significantly changed (series 2, image 108). There is loss of fat plane to the adjacent posterior urinary bladder wall and thickening of the bladder wall. Perirectal and presacral soft tissue stranding, slightly increased compared to prior examination. Other: No abdominal wall hernia or abnormality. No abdominopelvic ascites. Musculoskeletal: Redemonstrated sclerotic osseous lesion of the right ischial tuberosity (series 2, image 119). IMPRESSION: 1. There are multiple bilateral pulmonary nodules, generally subsolid in composition. The majority of nodules are diminished in size, or even completely resolved. A nodule of the dependent superior segment left lower lobe however appears to be slightly increased in size, measuring 1.2 x 0.9 cm, previously 0.9 x 0.9 cm when measured similarly. Findings are generally consistent with treatment response of pulmonary metastatic disease, however with some evidence of mixed response. 2. No significant interval change in previously FDG avid subcentimeter retroperitoneal lymph nodes, consistent with nodal metastatic disease. 3. Redemonstrated sclerotic osseous lesion of the right ischial tuberosity, not significant changed. No new osseous lesions identified by CT. 4. Circumferential soft tissue thickening about the cervix and lower uterine segment, overall dimensions approximately 5.5 x 3.5 cm, not significantly changed, consistent with primary cervical malignancy. There is loss of fat plane  to the adjacent posterior urinary bladder wall and thickening of the bladder wall, suggesting direct invasion. 5. Perirectal and presacral soft tissue stranding, slightly increased compared to prior examination and consistent with developing radiation change. 6. Worsened, severe left hydronephrosis and new, moderate right hydronephrosis secondary to obstruction by cervical mass. Electronically Signed   By: Eddie Candle M.D.   On: 10/03/2020 18:27   DG C-Arm 1-60 Min-No Report  Result Date: 10/10/2020 Fluoroscopy was utilized by the requesting physician.  No radiographic interpretation.

## 2020-10-14 NOTE — Discharge Summary (Signed)
Physician Discharge Summary  Phyllis Hebert JME:[id number] DOB: [birth year] DOA: 10/07/2020  PCP: [name], MD  Admit date: 10/07/2020 Discharge date: 10/14/2020  Admitted From: Home Disposition: Home  Recommendations for Outpatient Follow-up:  1. Follow up with medical oncology as scheduled 2. Follow-up with urology in 2.5 months 3. CBC at medical oncology visit 4. Please follow up on the following pending results: None  Home Health: Outpatient PT Equipment/Devices: None  Discharge Condition: Stable CODE STATUS: Partial Diet recommendation: Heart healthy   Brief/Interim Summary:  Admission HPI written by Steward Ros, MD   Chief Complaint: Vaginal bleeding  HPI: Phyllis Hebert is a 65 y.o. female with medical history significant of stage IV cervical cancer currently on chemotherapy comes in with vaginal bleeding.  Patient was diagnosed November of last year and has not had any vaginal bleeding surprisingly.  Last night she had a lot of the bleeding came to the emergency room for further evaluation.  Also for the past week she did not get her routine chemotherapy because her potassium levels were very low.  She was given a sounds like 40 mEq IV as an outpatient but was not given any oral repletion.  She comes in today also very tired unsure how much of this is due to her chemotherapy or due to her electrolyte abnormalities.  She is also had a very poor p.o. intake justifiably.  She does get nauseous but does not vomit a whole lot.  Patient also found to have a potassium of 2.4 and a magnesium level of 1.5.  She did get transfused a unit of blood last week for her pancytopenia associated with her chemotherapy.  Her hemoglobin today is above 9 which is better than her usual.  Patient be referred for admission for vaginal bleeding and for electrolyte abnormalities.  Her oncologist has been called and will be seeing patient in consultation also.   Hospital  course:  Vaginal bleeding Secondary to known cervical cancer.  Medical oncology was consulted and treated patient with tranexamic acid.  Vaginal bleeding currently resolved.  Acute blood loss anemia Acute on chronic anemia Acute anemia secondary to above.  Baseline hemoglobin of about 10. Hemoglobin of 3.8 on admission. Patient has required 4 units of PRBC to date.  Hemoglobin is currently stable.  Thrombocytopenia Stable. In setting of known cancer. Medical oncology considering bone marrow biopsy which will be deferred at this time.  Bilateral hydronephrosis CT abdomen/pelvis significant for severe left hydronephrosis with developing right-sided hydronephrosis.  Foley was placed.  Urology was also consulted.  Patient underwent right ureteral stent placement but was unable to have left stent placed.  Consideration for left nephrostomy tube, however this was decided against.  Creatinine is stable.  Neurology recommending follow-up in 2.5 months.  Foley catheter was removed.    Acute on chronic pain Medical oncology managing pain with narcotic regimen. Discharge regimen of MS Contin 15 mg BID, Dilaudid 2 mg q4 hours prn, prednisone 40 mg daily  Cervical cancer Patient follows with Dr. Alvy Bimler as an outpatient.  Cutaneous lupus erythematosus Stable  Hypomagnesemia Hypokalemia Supplementation given as needed.  Discharge Diagnoses:  Principal Problem:   Vaginal bleeding Active Problems:   Cutaneous lupus erythematosus   Malignant neoplasm of cervix [facility])   Essential hypertension   Hypomagnesemia   Hypokalemia    Discharge Instructions   Allergies as of 10/14/2020      Reactions   Penicillins Rash      Medication List  STOP taking these medications   lidocaine-prilocaine cream Commonly known as: EMLA   magnesium oxide 400 (241.3 Mg) MG tablet Commonly known as: MAG-OX   ondansetron 8 MG tablet Commonly known as: ZOFRAN   traMADol 50 MG tablet Commonly  known as: ULTRAM     TAKE these medications   calcium carbonate 500 MG chewable tablet Commonly known as: TUMS - dosed in mg elemental calcium Chew 1 tablet by mouth 2 (two) times daily.   dexamethasone 4 MG tablet Commonly known as: DECADRON Take 4 mg by mouth as directed.   fenofibrate 160 MG tablet TAKE 1 TABLET BY MOUTH EVERY DAY What changed: when to take this   folic acid 1 MG tablet Commonly known as: FOLVITE Take 2 tablets (2 mg total) by mouth daily. Start taking on: October 15, 2020   HYDROmorphone 2 MG tablet Commonly known as: Dilaudid Take 1 tablet (2 mg total) by mouth every 4 (four) hours as needed for severe pain.   LORazepam 0.5 MG tablet Commonly known as: ATIVAN Take 1 tablet (0.5 mg total) by mouth every 8 (eight) hours as needed for anxiety.   magnesium oxide 400 MG tablet Commonly known as: MAG-OX Take 400 mg by mouth daily.   morphine 15 MG 12 hr tablet Commonly known as: MS CONTIN Take 1 tablet (15 mg total) by mouth every 12 (twelve) hours for 15 days.   pantoprazole 40 MG tablet Commonly known as: PROTONIX Take 1 tablet (40 mg total) by mouth 2 (two) times daily.   polyethylene glycol 17 g packet Commonly known as: MIRALAX / GLYCOLAX Dissolve 17 g in water or juice and drink once daily. Start taking on: October 15, 2020   predniSONE 20 MG tablet Commonly known as: DELTASONE Take 2 tablets (40 mg total) by mouth daily with breakfast.   prochlorperazine 10 MG tablet Commonly known as: COMPAZINE TAKE 1 TABLET BY MOUTH EVERY 6 HOURS AS NEEDED FOR NAUSEA AND/OR VOMITING What changed: how much to take   senna-docusate 8.6-50 MG tablet Commonly known as: Senokot-S Take 2 tablets by mouth 2 (two) times daily.   Vitamin D3 50 MCG (2000 UT) Tabs Take 4,000 Units by mouth daily.       Follow-up Information    [name], MD. Go on 10/20/2020.   Specialty: Hematology and Oncology Why: Visit for 12:00 pm, labs to be drawn at 11:30 am,  check-in at 11:15 am Contact information: [place] [id number] [phone]              Allergies  Allergen Reactions  . Penicillins Rash    Consultations:  Medical oncology  Urology   Procedures/Studies: CT CHEST ABDOMEN PELVIS W CONTRAST  Result Date: 10/03/2020 CLINICAL DATA:  Metastatic cervical cancer restaging EXAM: CT CHEST, ABDOMEN, AND PELVIS WITH CONTRAST TECHNIQUE: Multidetector CT imaging of the chest, abdomen and pelvis was performed following the standard protocol during bolus administration of intravenous contrast. CONTRAST:  3m ISOVUE-300 IOPAMIDOL (ISOVUE-300) INJECTION 61%, additional oral enteric contrast COMPARISON:  PET-CT, 07/27/2020, PET-CT, 04/19/2020 FINDINGS: CT CHEST FINDINGS Cardiovascular: Right chest port catheter. Normal heart size. No pericardial effusion. Mediastinum/Nodes: No enlarged mediastinal, hilar, or axillary lymph nodes. Thyroid gland, trachea, and esophagus demonstrate no significant findings. Lungs/Pleura: There are multiple bilateral pulmonary nodules, generally subsolid in composition. The majority of nodules are diminished in size, for example a nodule of the medial posterior right upper lobe which has been reduced to an elongated residual measuring approximately 0.9 x  0.3 cm, previously 0.9 x 0.6 cm (series 4, image 45). A small nodule of the central left upper lobe previously seen is almost entirely resolved, measuring no greater than 1-2 mm (series 4, image 60). A nodule of the dependent superior segment left lower lobe however appears to be slightly increased in size, measuring 1.2 x 0.9 cm, previously 0.9 x 0.9 cm when measured similarly (series 4, image 47). No pleural effusion or pneumothorax. Musculoskeletal: No chest wall mass or suspicious bone lesions identified. CT ABDOMEN PELVIS FINDINGS Hepatobiliary: No solid liver abnormality is seen. No gallstones, gallbladder wall thickening, or biliary  dilatation. Pancreas: Unremarkable. No pancreatic ductal dilatation or surrounding inflammatory changes. Spleen: Normal in size without significant abnormality. Adrenals/Urinary Tract: Adrenal glands are unremarkable. There is redemonstrated severe left hydronephrosis, worsened compared to prior examination, with relative atrophy and cortical hypoenhancement of the left kidney. There is new moderate right hydronephrosis and hydroureter. The distal ureters are obstructed in the pelvis by soft tissue about the uterine cervix (series 2, image 110). Thickening of the urinary bladder wall. Stomach/Bowel: Stomach is within normal limits. Appendix appears normal. No evidence of bowel wall thickening, distention, or inflammatory changes. Vascular/Lymphatic: No significant vascular findings are present. No significant interval change in subcentimeter retroperitoneal lymph nodes, measuring up to 1.0 x 0.6 cm and previously FDG avid (series 2, image 76) Reproductive: Heterogeneous appearance and enhancement of the uterine fundus (series 8, image 112). Circumferential soft tissue thickening about the cervix and lower uterine segment, overall dimensions approximately 5.5 x 3.5 cm, not significantly changed (series 2, image 108). There is loss of fat plane to the adjacent posterior urinary bladder wall and thickening of the bladder wall. Perirectal and presacral soft tissue stranding, slightly increased compared to prior examination. Other: No abdominal wall hernia or abnormality. No abdominopelvic ascites. Musculoskeletal: Redemonstrated sclerotic osseous lesion of the right ischial tuberosity (series 2, image 119). IMPRESSION: 1. There are multiple bilateral pulmonary nodules, generally subsolid in composition. The majority of nodules are diminished in size, or even completely resolved. A nodule of the dependent superior segment left lower lobe however appears to be slightly increased in size, measuring 1.2 x 0.9 cm, previously  0.9 x 0.9 cm when measured similarly. Findings are generally consistent with treatment response of pulmonary metastatic disease, however with some evidence of mixed response. 2. No significant interval change in previously FDG avid subcentimeter retroperitoneal lymph nodes, consistent with nodal metastatic disease. 3. Redemonstrated sclerotic osseous lesion of the right ischial tuberosity, not significant changed. No new osseous lesions identified by CT. 4. Circumferential soft tissue thickening about the cervix and lower uterine segment, overall dimensions approximately 5.5 x 3.5 cm, not significantly changed, consistent with primary cervical malignancy. There is loss of fat plane to the adjacent posterior urinary bladder wall and thickening of the bladder wall, suggesting direct invasion. 5. Perirectal and presacral soft tissue stranding, slightly increased compared to prior examination and consistent with developing radiation change. 6. Worsened, severe left hydronephrosis and new, moderate right hydronephrosis secondary to obstruction by cervical mass. Electronically Signed   By: Eddie Candle M.D.   On: 10/03/2020 18:27   DG C-Arm 1-60 Min-No Report  Result Date: 10/10/2020 Fluoroscopy was utilized by the requesting physician.  No radiographic interpretation.      Subjective: Pain is better managed today.  Discharge Exam: Vitals:   10/14/20 0527 10/14/20 0830  BP: (!) 146/74 127/66  Pulse: 72 79  Resp: 19 17  Temp: 98.7 F (37.1 C) (!) 97.4  F (36.3 C)  SpO2: 97% 95%   Vitals:   10/13/20 1418 10/13/20 2018 10/14/20 0527 10/14/20 0830  BP: (!) 166/79 (!) 154/86 (!) 146/74 127/66  Pulse: 66 69 72 79  Resp: _0 Temp: 98.8 F (37.1 C) 98.9 F (37.2 C) 98.7 F (37.1 C) (!) 97.4 F (36.3 C)  TempSrc: Oral Oral Oral Oral  SpO2: 95% 98% 97% 95%  Weight:   93.8 kg   Height:        General: Pt is alert, awake, not in acute distress Cardiovascular: RRR, S1/S2 +, no rubs, no  gallops Respiratory: CTA bilaterally, no wheezing, no rhonchi Abdominal: Soft, NT, ND, bowel sounds + Extremities: no edema, no cyanosis    The results of significant diagnostics from this hospitalization (including imaging, microbiology, ancillary and laboratory) are listed below for reference.     Microbiology: Recent Results (from the past 240 hour(s))  Resp Panel by RT-PCR (Flu A&B, Covid) Nasopharyngeal Swab     Status: None   Collection Time: 10/08/20  2:32 AM   Specimen: Nasopharyngeal Swab; Nasopharyngeal(NP) swabs in vial transport medium  Result Value Ref Range Status   SARS Coronavirus 2 by RT PCR NEGATIVE NEGATIVE Final    Comment: (NOTE) SARS-CoV-2 target nucleic acids are NOT DETECTED.  The SARS-CoV-2 RNA is generally detectable in upper respiratory specimens during the acute phase of infection. The lowest concentration of SARS-CoV-2 viral copies this assay can detect is 138 copies/mL. A negative result does not preclude SARS-Cov-2 infection and should not be used as the sole basis for treatment or other patient management decisions. A negative result may occur with  improper specimen collection/handling, submission of specimen other than nasopharyngeal swab, presence of viral mutation(s) within the areas targeted by this assay, and inadequate number of viral copies(<138 copies/mL). A negative result must be combined with clinical observations, patient history, and epidemiological information. The expected result is Negative.  Fact Sheet for Patients:  EntrepreneurPulse.com.au  Fact Sheet for Healthcare Providers:  IncredibleEmployment.be  This test is no t yet approved or cleared by the [place] FDA and  has been authorized for detection and/or diagnosis of SARS-CoV-2 by FDA under an Emergency Use Authorization (EUA). This EUA will remain  in effect (meaning this test can be used) for the duration of the COVID-19  declaration under Section 564(b)(1) of the Act, 21 U.S.C.section 360bbb-3(b)(1), unless the authorization is terminated  or revoked sooner.       Influenza A by PCR NEGATIVE NEGATIVE Final   Influenza B by PCR NEGATIVE NEGATIVE Final    Comment: (NOTE) The Xpert Xpress SARS-CoV-2/FLU/RSV plus assay is intended as an aid in the diagnosis of influenza from Nasopharyngeal swab specimens and should not be used as a sole basis for treatment. Nasal washings and aspirates are unacceptable for Xpert Xpress SARS-CoV-2/FLU/RSV testing.  Fact Sheet for Patients: EntrepreneurPulse.com.au  Fact Sheet for Healthcare Providers: IncredibleEmployment.be  This test is not yet approved or cleared by the [place] FDA and has been authorized for detection and/or diagnosis of SARS-CoV-2 by FDA under an Emergency Use Authorization (EUA). This EUA will remain in effect (meaning this test can be used) for the duration of the COVID-19 declaration under Section 564(b)(1) of the Act, 21 U.S.C. section 360bbb-3(b)(1), unless the authorization is terminated or revoked.  Performed at [facility], [place] [address], [place], [place] [address]      Labs: BNP (last 3 results) No results for input(s): BNP in  the last 8760 hours. Basic Metabolic Panel: Recent Labs  Lab 10/08/20 0225 10/08/20 0655 10/08/20 1021 10/08/20 1758 10/09/20 1224 10/10/20 0510 10/12/20 0500 10/13/20 0509 10/14/20 0955  NA  --    < >  --    < > 137 140 141 141 141  K  --    < >  --    < > 3.1* 3.6 3.6 3.2* 3.4*  CL  --    < >  --    < > 106 109 111 106 105  CO2  --    < >  --    < > _0 GLUCOSE  --    < >  --    < > 133* 119* 99 99 128*  BUN  --    < >  --    < > _1 CREATININE  --    < >  --    < > 1.35* 1.10* 1.05* 0.97 0.87  CALCIUM  --    < >  --    < > 7.6* 7.8* 8.1* 8.4* 9.1  MG 1.5*  --  1.7  --   --   --   --   --   --    < > =  values in this interval not displayed.   Liver Function Tests: Recent Labs  Lab 10/09/20 1224 10/10/20 0510 10/12/20 0500 10/13/20 0509 10/14/20 0955  AST 13* 12* _2 ALT _3 ALKPHOS 43 53 52 57 61  BILITOT 0.4 0.7 0.3 0.5 0.4  PROT 4.6* 4.9* 5.3* 5.5* 6.0*  ALBUMIN 2.2* 2.3* 2.1* 2.3* 2.5*   No results for input(s): LIPASE, AMYLASE in the last 168 hours. No results for input(s): AMMONIA in the last 168 hours. CBC: Recent Labs  Lab 10/08/20 0040 10/08/20 0655 10/10/20 1430 10/11/20 0512 10/11/20 1825 10/12/20 0500 10/13/20 0509 10/14/20 0955  WBC 8.7   < > 10.8* 9.7  --  7.3 6.8 10.9*  NEUTROABS 7.3  --   --   --   --   --   --  9.5*  HGB 8.5*   < > 7.7* 7.0* 8.8* 8.1* 8.9* 9.6*  HCT 25.6*   < > 23.7* 21.2* 26.7* 24.8* 27.3* 29.8*  MCV 91.1   < > 86.5 86.9  --  88.3 87.8 88.2  PLT 121*   < > 57* 50*  --  48* 57* 71*   < > = values in this interval not displayed.   Cardiac Enzymes: No results for input(s): CKTOTAL, CKMB, CKMBINDEX, TROPONINI in the last 168 hours. BNP: Invalid input(s): POCBNP CBG: No results for input(s): GLUCAP in the last 168 hours. D-Dimer No results for input(s): DDIMER in the last 72 hours. Hgb A1c No results for input(s): HGBA1C in the last 72 hours. Lipid Profile No results for input(s): CHOL, HDL, LDLCALC, TRIG, CHOLHDL, LDLDIRECT in the last 72 hours. Thyroid function studies No results for input(s): TSH, T4TOTAL, T3FREE, THYROIDAB in the last 72 hours.  Invalid input(s): FREET3 Anemia work up Recent Labs    10/11/20 1825  FERRITIN 704*  TIBC 166*  IRON 58   Urinalysis    Component Value Date/Time   COLORURINE YELLOW 10/08/2020 1600   APPEARANCEUR CLEAR 10/08/2020 1600   LABSPEC 1.012 10/08/2020 1600   PHURINE 6.0 10/08/2020 1600   GLUCOSEU NEGATIVE 10/08/2020 1600   HGBUR SMALL (A) 10/08/2020 1600  BILIRUBINUR NEGATIVE 10/08/2020 1600   KETONESUR NEGATIVE 10/08/2020 1600   PROTEINUR NEGATIVE  10/08/2020 1600   NITRITE NEGATIVE 10/08/2020 1600   LEUKOCYTESUR NEGATIVE 10/08/2020 1600   Sepsis Labs Invalid input(s): PROCALCITONIN,  WBC,  LACTICIDVEN Microbiology Recent Results (from the past 240 hour(s))  Resp Panel by RT-PCR (Flu A&B, Covid) Nasopharyngeal Swab     Status: None   Collection Time: 10/08/20  2:32 AM   Specimen: Nasopharyngeal Swab; Nasopharyngeal(NP) swabs in vial transport medium  Result Value Ref Range Status   SARS Coronavirus 2 by RT PCR NEGATIVE NEGATIVE Final    Comment: (NOTE) SARS-CoV-2 target nucleic acids are NOT DETECTED.  The SARS-CoV-2 RNA is generally detectable in upper respiratory specimens during the acute phase of infection. The lowest concentration of SARS-CoV-2 viral copies this assay can detect is 138 copies/mL. A negative result does not preclude SARS-Cov-2 infection and should not be used as the sole basis for treatment or other patient management decisions. A negative result may occur with  improper specimen collection/handling, submission of specimen other than nasopharyngeal swab, presence of viral mutation(s) within the areas targeted by this assay, and inadequate number of viral copies(<138 copies/mL). A negative result must be combined with clinical observations, patient history, and epidemiological information. The expected result is Negative.  Fact Sheet for Patients:  EntrepreneurPulse.com.au  Fact Sheet for Healthcare Providers:  IncredibleEmployment.be  This test is no t yet approved or cleared by the [place] FDA and  has been authorized for detection and/or diagnosis of SARS-CoV-2 by FDA under an Emergency Use Authorization (EUA). This EUA will remain  in effect (meaning this test can be used) for the duration of the COVID-19 declaration under Section 564(b)(1) of the Act, 21 U.S.C.section 360bbb-3(b)(1), unless the authorization is terminated  or revoked sooner.        Influenza A by PCR NEGATIVE NEGATIVE Final   Influenza B by PCR NEGATIVE NEGATIVE Final    Comment: (NOTE) The Xpert Xpress SARS-CoV-2/FLU/RSV plus assay is intended as an aid in the diagnosis of influenza from Nasopharyngeal swab specimens and should not be used as a sole basis for treatment. Nasal washings and aspirates are unacceptable for Xpert Xpress SARS-CoV-2/FLU/RSV testing.  Fact Sheet for Patients: EntrepreneurPulse.com.au  Fact Sheet for Healthcare Providers: IncredibleEmployment.be  This test is not yet approved or cleared by the [place] FDA and has been authorized for detection and/or diagnosis of SARS-CoV-2 by FDA under an Emergency Use Authorization (EUA). This EUA will remain in effect (meaning this test can be used) for the duration of the COVID-19 declaration under Section 564(b)(1) of the Act, 21 U.S.C. section 360bbb-3(b)(1), unless the authorization is terminated or revoked.  Performed at [facility], [place] [address] [place], [place] [address]      Time coordinating discharge: 35 minutes  SIGNED:   Cordelia Poche, MD [facility] 10/14/2020, 10:53 AM

## 2020-10-15 ENCOUNTER — Telehealth: Payer: Self-pay

## 2020-10-15 NOTE — Telephone Encounter (Signed)
Patient notified and verbalized understanding and agreement.  RN confirmed medication changes via teach back method.  RN updated appointment, patient aware.

## 2020-10-15 NOTE — Telephone Encounter (Signed)
Tell to her increase MS contin to TID (scheduled) and take dilaudid to 1-2 pills each time as needed Please also move her appt to Monday, 45 mins appt in the afternoon, take the new patient slot She needs labs drawn before her appt

## 2020-10-15 NOTE — Telephone Encounter (Signed)
Patient called to update about pain management.    Pt received medications that were sent yesterday, Dilaudid and MS Contin.   Pt reports pain has not improved, having significant pressure.  Pt states she is taking medications exactly as prescribed, and pain will ease to about 5/6.    Will forward to MD for any further recommendations.

## 2020-10-18 ENCOUNTER — Inpatient Hospital Stay (HOSPITAL_BASED_OUTPATIENT_CLINIC_OR_DEPARTMENT_OTHER): Payer: Managed Care, Other (non HMO) | Admitting: Hematology and Oncology

## 2020-10-18 ENCOUNTER — Other Ambulatory Visit: Payer: Self-pay

## 2020-10-18 ENCOUNTER — Inpatient Hospital Stay: Payer: Managed Care, Other (non HMO)

## 2020-10-18 ENCOUNTER — Inpatient Hospital Stay: Payer: Managed Care, Other (non HMO) | Attending: Gynecologic Oncology

## 2020-10-18 ENCOUNTER — Encounter: Payer: Self-pay | Admitting: Hematology and Oncology

## 2020-10-18 DIAGNOSIS — C7951 Secondary malignant neoplasm of bone: Secondary | ICD-10-CM | POA: Insufficient documentation

## 2020-10-18 DIAGNOSIS — Z79899 Other long term (current) drug therapy: Secondary | ICD-10-CM | POA: Insufficient documentation

## 2020-10-18 DIAGNOSIS — Z7189 Other specified counseling: Secondary | ICD-10-CM | POA: Diagnosis not present

## 2020-10-18 DIAGNOSIS — C539 Malignant neoplasm of cervix uteri, unspecified: Secondary | ICD-10-CM | POA: Insufficient documentation

## 2020-10-18 DIAGNOSIS — C530 Malignant neoplasm of endocervix: Secondary | ICD-10-CM

## 2020-10-18 DIAGNOSIS — D61818 Other pancytopenia: Secondary | ICD-10-CM | POA: Diagnosis not present

## 2020-10-18 DIAGNOSIS — K5909 Other constipation: Secondary | ICD-10-CM | POA: Diagnosis not present

## 2020-10-18 DIAGNOSIS — Z79891 Long term (current) use of opiate analgesic: Secondary | ICD-10-CM | POA: Insufficient documentation

## 2020-10-18 DIAGNOSIS — G893 Neoplasm related pain (acute) (chronic): Secondary | ICD-10-CM | POA: Insufficient documentation

## 2020-10-18 DIAGNOSIS — F064 Anxiety disorder due to known physiological condition: Secondary | ICD-10-CM | POA: Insufficient documentation

## 2020-10-18 LAB — SAMPLE TO BLOOD BANK

## 2020-10-18 LAB — CMP (CANCER CENTER ONLY)
ALT: 13 U/L (ref 0–44)
AST: 11 U/L — ABNORMAL LOW (ref 15–41)
Albumin: 2.8 g/dL — ABNORMAL LOW (ref 3.5–5.0)
Alkaline Phosphatase: 63 U/L (ref 38–126)
Anion gap: 10 (ref 5–15)
BUN: 19 mg/dL (ref 8–23)
CO2: 28 mmol/L (ref 22–32)
Calcium: 9.3 mg/dL (ref 8.9–10.3)
Chloride: 101 mmol/L (ref 98–111)
Creatinine: 1.1 mg/dL — ABNORMAL HIGH (ref 0.44–1.00)
GFR, Estimated: 56 mL/min — ABNORMAL LOW (ref >60)
Glucose, Bld: 166 mg/dL — ABNORMAL HIGH (ref 70–99)
Potassium: 4 mmol/L (ref 3.5–5.1)
Sodium: 139 mmol/L (ref 135–145)
Total Bilirubin: 0.5 mg/dL (ref 0.3–1.2)
Total Protein: 6.3 g/dL — ABNORMAL LOW (ref 6.5–8.1)

## 2020-10-18 LAB — CBC WITH DIFFERENTIAL (CANCER CENTER ONLY)
Abs Immature Granulocytes: 0.06 10*3/uL (ref 0.00–0.07)
Basophils Absolute: 0 10*3/uL (ref 0.0–0.1)
Basophils Relative: 0 %
Eosinophils Absolute: 0 10*3/uL (ref 0.0–0.5)
Eosinophils Relative: 0 %
HCT: 29.5 % — ABNORMAL LOW (ref 36.0–46.0)
Hemoglobin: 9.5 g/dL — ABNORMAL LOW (ref 12.0–15.0)
Immature Granulocytes: 1 %
Lymphocytes Relative: 3 %
Lymphs Abs: 0.3 10*3/uL — ABNORMAL LOW (ref 0.7–4.0)
MCH: 28.3 pg (ref 26.0–34.0)
MCHC: 32.2 g/dL (ref 30.0–36.0)
MCV: 87.8 fL (ref 80.0–100.0)
Monocytes Absolute: 0.4 10*3/uL (ref 0.1–1.0)
Monocytes Relative: 4 %
Neutro Abs: 9.9 10*3/uL — ABNORMAL HIGH (ref 1.7–7.7)
Neutrophils Relative %: 92 %
Platelet Count: 82 10*3/uL — ABNORMAL LOW (ref 150–400)
RBC: 3.36 MIL/uL — ABNORMAL LOW (ref 3.87–5.11)
RDW: 17.1 % — ABNORMAL HIGH (ref 11.5–15.5)
WBC Count: 10.7 10*3/uL — ABNORMAL HIGH (ref 4.0–10.5)
nRBC: 0 % (ref 0.0–0.2)

## 2020-10-18 LAB — MAGNESIUM: Magnesium: 1.7 mg/dL (ref 1.7–2.4)

## 2020-10-18 LAB — TSH: TSH: 1.801 u[IU]/mL (ref 0.350–4.500)

## 2020-10-18 MED ORDER — HEPARIN SOD (PORK) LOCK FLUSH 100 UNIT/ML IV SOLN
500.0000 [IU] | Freq: Once | INTRAVENOUS | Status: AC
  Administered 2020-10-18: 500 [IU]
  Filled 2020-10-18: qty 5

## 2020-10-18 MED ORDER — HYDROMORPHONE HCL 4 MG PO TABS: 4.0000 mg | ORAL_TABLET | ORAL | Status: DC | PRN

## 2020-10-18 MED ORDER — MORPHINE SULFATE ER 30 MG PO TBCR: EXTENDED_RELEASE_TABLET | ORAL | 0 refills | Status: DC

## 2020-10-18 MED ORDER — SODIUM CHLORIDE 0.9% FLUSH
10.0000 mL | Freq: Once | INTRAVENOUS | Status: AC
  Administered 2020-10-18: 10 mL
  Filled 2020-10-18: qty 10

## 2020-10-18 NOTE — Assessment & Plan Note (Signed)
She has severe persistent pancytopenia but her platelet count is gradually improving She has no recent bleeding complication Observe closely She does not need transfusion support

## 2020-10-18 NOTE — Assessment & Plan Note (Signed)
Her pain is better controlled with long-acting MS Contin but still not under good control I recommend increasing MS Contin to 30 mg 3 times a day and for her to continue to take Dilaudid as needed I will call her in 2 days to assess pain control She is not due for medication refill yet

## 2020-10-18 NOTE — Assessment & Plan Note (Signed)
We have extensive goals of care discussions Unfortunately, she did not respond well to recent treatment Estimated response rate/benefit for all future chemotherapy is in the region of 20% Goals of treatment is palliative We discussed prognosis, likely in the region of 6 months or less

## 2020-10-18 NOTE — Assessment & Plan Note (Signed)
Overall, given her clinical progression, the choice of treatment is limited She is having uncontrolled cancer associated pain We reviewed the guidelines She has been treated in the past 6 months with cisplatin, carboplatin, paclitaxel, bevacizumab as well as pembrolizumab She has prolonged pancytopenia likely due to bone marrow suppression from treatment as well as her bone marrow involvement She is not a candidate for further bevacizumab given recent severe life-threatening bleeding Her PD-L1 score is only 2% and she had severe reaction to pembrolizumab likely due to her lupus We discussed the risk, benefits, side effects of topotecan versus gemcitabine versus tisotumab She is undecided She will go home and discuss the plan of care with family I plan to call her in 2 days to see how she is doing with pain control and final decision She is not ready to resume treatment this week due to persistent pancytopenia Continue supportive care for now

## 2020-10-18 NOTE — Progress Notes (Signed)
[facility] OFFICE PROGRESS NOTE  Patient Care Team: [name], MD as PCP - General (Family Medicine) [name], MD as Consulting Physician (Obstetrics and Gynecology) [name], RN as Oncology Nurse Navigator (Oncology)  ASSESSMENT & PLAN:  Malignant neoplasm of cervix ([place]) Overall, given her clinical progression, the choice of treatment is limited She is having uncontrolled cancer associated pain We reviewed the guidelines She has been treated in the past 6 months with cisplatin, carboplatin, paclitaxel, bevacizumab as well as pembrolizumab She has prolonged pancytopenia likely due to bone marrow suppression from treatment as well as her bone marrow involvement She is not a candidate for further bevacizumab given recent severe life-threatening bleeding Her PD-L1 score is only 2% and she had severe reaction to pembrolizumab likely due to her lupus We discussed the risk, benefits, side effects of topotecan versus gemcitabine versus tisotumab She is undecided She will go home and discuss the plan of care with family I plan to call her in 2 days to see how she is doing with pain control and final decision She is not ready to resume treatment this week due to persistent pancytopenia Continue supportive care for now  Pancytopenia, acquired [facility]) She has severe persistent pancytopenia but her platelet count is gradually improving She has no recent bleeding complication Observe closely She does not need transfusion support  Cancer associated pain Her pain is better controlled with long-acting MS Contin but still not under good control I recommend increasing MS Contin to 30 mg 3 times a day and for her to continue to take Dilaudid as needed I will call her in 2 days to assess pain control She is not due for medication refill yet  Goals of care, counseling/discussion We have extensive goals of care discussions Unfortunately, she did not respond well to  recent treatment Estimated response rate/benefit for all future chemotherapy is in the region of 20% Goals of treatment is palliative We discussed prognosis, likely in the region of 6 months or less   No orders of the defined types were placed in this encounter.   All questions were answered. The patient knows to call the clinic with any problems, questions or concerns. The total time spent in the appointment was 40 minutes encounter with patients including review of chart and various tests results, discussions about plan of care and coordination of care plan   [name], MD 10/18/2020 1:36 PM  INTERVAL HISTORY: Please see below for problem oriented charting. She returns with her husband for further follow-up Last Friday, she has slight worsening pain again Her MS Contin was increased to 15 mg 3 times a day and Dilaudid to take 1 to 2 tablets as needed Her pain is slightly better controlled but she would like to get her pain medicine increase She denies nausea She have difficulties with urination; it was painful for her to sit on the toilet seat to urinate each time She also had painful bowel movement within the first few seconds She denies recent bleeding  SUMMARY OF ONCOLOGIC HISTORY: Oncology History Overview Note  PD-L1 CPS: 2   Malignant neoplasm of cervix ([place])  03/16/2020 Initial Diagnosis   The patient had history of new onset vaginal spotting on March 16, 2020.  She immediately called her OB/GYN's office and achieved an appointment with her OB/GYN's partner, Dr. Murrell Redden, who saw and evaluated the patient on March 19, 2020.  At that time a transvaginal ultrasound scan was performed which revealed a uterus  measuring 5.3 x 8.4 x 4.6 cm with an ill-defined endometrium.  There is a complex cyst on the left ovary measuring 2.6 cm with peripheral blood flow noted.  Physical exam identified a cervical mass which was biopsied on that same day (03/19/2020) which revealed invasive  adenocarcinoma involving the fibrous stroma.  Immunostains were positive for CEA and CDX2, negative for p16, ER, vimentin, and PAX8.  The differential diagnosis included primary gastric type endocervical adenocarcinoma (though this typically stains positive for PAX8) or a metastasis from pancreaticobiliary or upper GI primary.   03/29/2020 Pathology Results   1. Surgical [P], duodenum - PEPTIC DUODENITIS. - NO DYSPLASIA OR MALIGNANCY. 2. Surgical [P], gastric antrum and gastric body - REACTIVE GASTROPATHY. [place] IS NEGATIVE FOR HELICOBACTER PYLORI. - NO INTESTINAL METAPLASIA, DYSPLASIA, OR MALIGNANCY. 3. Surgical [P], gastric nodule - MILD REACTIVE GASTROPATHY. - NO INTESTINAL METAPLASIA, DYSPLASIA, OR MALIGNANCY. 4. Surgical [P], stomach, lesser curve ulcers - REACTIVE GASTROPATHY WITH EROSIONS. - NO INTESTINAL METAPLASIA, DYSPLASIA, OR MALIGNANCY. 5. Surgical [P], gastric polyps - HYPERPLASTIC POLYP. - NO INTESTINAL METAPLASIA, DYSPLASIA OR MALIGNANCY. 6. Surgical [P], colon, descending, polyp - HYPERPLASTIC POLYP. - NO DYSPLASIA OR MALIGNANCY.   04/06/2020 Procedure   EGD report  Normal esophagus. - A few gastric polyps. Suspected fundic gland polyps. - Non-bleeding gastric ulcers with pigmented material. Biopsied. - A single gastric polyp/nodule. Resected and retrieved. - Erythematous duodenopathy. Biopsied. - The examination was otherwise normal.   04/06/2020 Procedure   Colonoscopy report - Non-bleeding internal hemorrhoids. - One 4 mm polyp in the descending colon, removed with a cold snare. Resected and retrieved. - The examination was otherwise normal on direct and retroflexion views   04/19/2020 PET scan   1. Hypermetabolic uterine cervix mass compatible with known primary cervical malignancy, with hypermetabolism extending into the upper third of the vagina and extending throughout the uterine body. No frank extrauterine extension. 2. Mild to moderate  left hydroureteronephrosis to the level of the left UVJ, concerning for tumor involvement of the left UVJ. 3. Hypermetabolic right common iliac nodal metastasis. 4. Hypermetabolic sclerotic right ischial bone metastasis. 5. Two indeterminate small scattered solid pulmonary nodules, below PET resolution, warranting attention on chest CT follow-up in 3 months. 6.  Aortic Atherosclerosis (ICD10-I70.0).   04/19/2020 Cancer Staging   Staging form: Cervix Uteri, AJCC Version 9 - Clinical stage from 04/19/2020: FIGO Stage IVB (cT3b, cN1a, cM1) - Signed by [name], MD on 04/19/2020   04/26/2020 Procedure   Successful placement of a right IJ approach Power Port with ultrasound and fluoroscopic guidance. The catheter is ready for use   04/30/2020 - 05/21/2020 Chemotherapy   The patient had cisplatin for chemotherapy treatment.     06/20/2020 Genetic Testing   Negative genetic testing on the CancerNext-Expanded+RNAinsight panel.  The CancerNext-Expanded gene panel offered by [facility] and includes sequencing and rearrangement analysis for the following 77 genes: AIP, ALK, APC*, ATM*, AXIN2, BAP1, BARD1, BLM, BMPR1A, BRCA1*, BRCA2*, BRIP1*, CDC73, CDH1*, CDK4, CDKN1B, CDKN2A, CHEK2*, CTNNA1, DICER1, FANCC, FH, FLCN, GALNT12, KIF1B, LZTR1, MAX, MEN1, MET, MLH1*, MSH2*, MSH3, MSH6*, MUTYH*, NBN, NF1*, NF2, NTHL1, PALB2*, PHOX2B, PMS2*, POT1, PRKAR1A, PTCH1, PTEN*, RAD51C*, RAD51D*, RB1, RECQL, RET, SDHA, SDHAF2, SDHB, SDHC, SDHD, SMAD4, SMARCA4, SMARCB1, SMARCE1, STK11, SUFU, TMEM127, TP53*, TSC1, TSC2, VHL and XRCC2 (sequencing and deletion/duplication); EGFR, EGLN1, HOXB13, KIT, MITF, PDGFRA, POLD1, and POLE (sequencing only); EPCAM and GREM1 (deletion/duplication only). DNA and RNA analyses performed for * genes. The report date is June 20, 2020.  07/27/2020 PET scan   1. Enlarging and new bilateral pulmonary nodules are evident today. Larger nodules in the right upper lung show FDG accumulation.  Imaging features compatible with metastatic disease. 2. New hypermetabolic lymph nodes identified in the retrocaval space of the upper abdomen and para-aortic space of the lower abdomen, consistent with new metastatic involvement. There is some persistent hypermetabolism associated with small lymph nodes along the right common iliac chain. 3. Interval decrease in hypermetabolism associated with the cervix. 4. Persistent, mildly progressive left hydroureteronephrosis. Left ureter is dilated down to the level it passes the cervix, just proximal to the UVJ. 5. Decreased hypermetabolism in the previously identified right ischial tuberosity lesion consistent with metastatic disease.  6. New punctate focus of hypermetabolism in the gastric fundus without underlying mass lesion evident by CT. Attention on follow-up recommended. 7. Tiny gas bubble in the bladder lumen is presumably secondary to recent instrumentation although bladder infection could have this appearance.   07/28/2020 -  Chemotherapy    Patient is on Treatment Plan: CERVICAL PEMBROLIZUMAB/CARBOPLATIN + PACLITAXEL + BEVACIZUMAB Q21D       10/04/2020 Imaging   1. There are multiple bilateral pulmonary nodules, generally subsolid in composition. The majority of nodules are diminished in size, or even completely resolved. A nodule of the dependent superior segment left lower lobe however appears to be slightly increased in size, measuring 1.2 x 0.9 cm, previously 0.9 x 0.9 cm when measured similarly. Findings are generally consistent with treatment response of pulmonary metastatic disease, however with some evidence of mixed response. 2. No significant interval change in previously FDG avid subcentimeter retroperitoneal lymph nodes, consistent with nodal metastatic disease. 3. Redemonstrated sclerotic osseous lesion of the right ischial tuberosity, not significant changed. No new osseous lesions identified by CT. 4. Circumferential soft tissue  thickening about the cervix and lower uterine segment, overall dimensions approximately 5.5 x 3.5 cm, not significantly changed, consistent with primary cervical malignancy. There is loss of fat plane to the adjacent posterior urinary bladder wall and thickening of the bladder wall, suggesting direct invasion. 5. Perirectal and presacral soft tissue stranding, slightly increased compared to prior examination and consistent with developing radiation change. 6. Worsened, severe left hydronephrosis and new, moderate right hydronephrosis secondary to obstruction by cervical mass.     10/07/2020 - 10/14/2020 Hospital Admission   She was admitted to the hospital due to severe vaginal bleeding requiring multiple units of blood transfusion.  She also had cystoscopy with right ureteral stent placement and left diagnostic ureteroscopy while in the hospital She also have significant pain requiring multiple adjustment of pain medicine   10/10/2020 Surgery   Preoperative diagnosis:  1.  Bilateral hydronephrosis secondary to extrinsic compression   Postoperative diagnosis: 1.  Bilateral hydronephrosis secondary to extrinsic compression 2.  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  Cystoscopy with right retrograde pyelogram with right ureteral stent placement 2.  Left diagnostic ureteroscopy   Surgeon: Link Snuffer, MD    Drains/Catheters: 1.  Right 6 x 24 double-J ureteral stent 2.  Foley catheter   Intraoperative findings: 1.  Normal urethra 2.  Significant trabeculation within the bladder.  Low capacity bladder that seems somewhat noncompliant questionable secondary to her history of radiation. 3.  Right retrograde pyelogram revealed severe hydroureteronephrosis.  Successful placement of right ureteral stent 4.  Difficult to locate left ureteral orifice.  It looked like the distal ureter was completely occluded questionably by tumor?  Metastasis to bone  ([place])  04/19/2020 Initial Diagnosis   Metastasis to bone ([place])   07/28/2020 -  Chemotherapy    Patient is on Treatment Plan: CERVICAL PEMBROLIZUMAB/CARBOPLATIN + PACLITAXEL + BEVACIZUMAB Q21D         REVIEW OF SYSTEMS:   Constitutional: Denies fevers, chills or abnormal weight loss Eyes: Denies blurriness of vision Ears, nose, mouth, throat, and face: Denies mucositis or sore throat Respiratory: Denies cough, dyspnea or wheezes Cardiovascular: Denies palpitation, chest discomfort or lower extremity swelling Gastrointestinal:  Denies nausea, heartburn or change in bowel habits Skin: Denies abnormal skin rashes Lymphatics: Denies new lymphadenopathy or easy bruising Neurological:Denies numbness, tingling or new weaknesses Behavioral/Psych: Mood is stable, no new changes  All other systems were reviewed with the patient and are negative.  I have reviewed the past medical history, past surgical history, social history and family history with the patient and they are unchanged from previous note.  ALLERGIES:  is allergic to penicillins.  MEDICATIONS:  Current Outpatient Medications  Medication Sig Dispense Refill  . calcium carbonate (TUMS - DOSED IN MG ELEMENTAL CALCIUM) 500 MG chewable tablet Chew 1 tablet by mouth 2 (two) times daily.    . Cholecalciferol (VITAMIN D3) 2000 units TABS Take 4,000 Units by mouth daily.    Marland Kitchen dexamethasone (DECADRON) 4 MG tablet Take 4 mg by mouth as directed.    . fenofibrate 160 MG tablet TAKE 1 TABLET BY MOUTH EVERY DAY (Patient taking differently: Take 160 mg by mouth daily with lunch.) 30 tablet 5  . folic acid (FOLVITE) 1 MG tablet Take 2 tablets (2 mg total) by mouth daily.    Marland Kitchen HYDROmorphone (DILAUDID) 4 MG tablet Take 1 tablet (4 mg total) by mouth every 4 (four) hours as needed for severe pain.    Marland Kitchen LORazepam (ATIVAN) 0.5 MG tablet Take 1 tablet (0.5 mg total) by mouth every 8 (eight) hours as needed for anxiety. 30 tablet 0  . magnesium oxide  (MAG-OX) 400 MG tablet Take 400 mg by mouth daily.    Marland Kitchen morphine (MS CONTIN) 30 MG 12 hr tablet Take 30 mg TID 30 tablet 0  . pantoprazole (PROTONIX) 40 MG tablet Take 1 tablet (40 mg total) by mouth 2 (two) times daily. 90 tablet 3  . polyethylene glycol (MIRALAX / GLYCOLAX) 17 g packet Dissolve 17 g in water or juice and drink once daily. 14 each 0  . predniSONE (DELTASONE) 20 MG tablet Take 2 tablets (40 mg total) by mouth daily with breakfast. 60 tablet 1  . prochlorperazine (COMPAZINE) 10 MG tablet TAKE 1 TABLET BY MOUTH EVERY 6 HOURS AS NEEDED FOR NAUSEA AND/OR VOMITING (Patient taking differently: Take 10 mg by mouth every 6 (six) hours as needed.) 90 tablet 1  . senna-docusate (SENOKOT-S) 8.6-50 MG tablet Take 2 tablets by mouth 2 (two) times daily. 30 tablet 0   No current facility-administered medications for this visit.    PHYSICAL EXAMINATION: ECOG PERFORMANCE STATUS: 2 - Symptomatic, <50% confined to bed  Vitals:   10/18/20 1254  BP: (!) 167/92  Pulse: (!) 18  Resp: 18  Temp: 98.4 F (36.9 C)  SpO2: 100%   Filed Weights   10/18/20 1254  Weight: 200 lb 3.2 oz (90.8 kg)    GENERAL:alert, no distress and comfortable  NEURO: alert & oriented x 3 with fluent speech, no focal motor/sensory deficits  LABORATORY DATA:  I have reviewed the data as listed    Component Value Date/Time  NA 141 10/14/2020 0955   NA 140 06/21/2016 1530   K 3.4 (L) 10/14/2020 0955   CL 105 10/14/2020 0955   CO2 26 10/14/2020 0955   GLUCOSE 128 (H) 10/14/2020 0955   BUN 11 10/14/2020 0955   BUN 16 06/21/2016 1530   CREATININE 0.87 10/14/2020 0955   CREATININE 1.38 (H) 10/01/2020 1429   CREATININE 0.87 07/02/2017 1549   CALCIUM 9.1 10/14/2020 0955   PROT 6.0 (L) 10/14/2020 0955   PROT 7.3 06/21/2016 1530   ALBUMIN 2.5 (L) 10/14/2020 0955   ALBUMIN 4.3 06/21/2016 1530   AST 15 10/14/2020 0955   AST 10 (L) 10/01/2020 1429   ALT 18 10/14/2020 0955   ALT 8 10/01/2020 1429   ALKPHOS 61  10/14/2020 0955   BILITOT 0.4 10/14/2020 0955   BILITOT 0.4 10/01/2020 1429   GFRNONAA >60 10/14/2020 0955   GFRNONAA 43 (L) 10/01/2020 1429   GFRAA 111 06/21/2016 1530    No results found for: SPEP, UPEP  Lab Results  Component Value Date   WBC 10.7 (H) 10/18/2020   NEUTROABS 9.9 (H) 10/18/2020   HGB 9.5 (L) 10/18/2020   HCT 29.5 (L) 10/18/2020   MCV 87.8 10/18/2020   PLT 82 (L) 10/18/2020      Chemistry      Component Value Date/Time   NA 141 10/14/2020 0955   NA 140 06/21/2016 1530   K 3.4 (L) 10/14/2020 0955   CL 105 10/14/2020 0955   CO2 26 10/14/2020 0955   BUN 11 10/14/2020 0955   BUN 16 06/21/2016 1530   CREATININE 0.87 10/14/2020 0955   CREATININE 1.38 (H) 10/01/2020 1429   CREATININE 0.87 07/02/2017 1549      Component Value Date/Time   CALCIUM 9.1 10/14/2020 0955   ALKPHOS 61 10/14/2020 0955   AST 15 10/14/2020 0955   AST 10 (L) 10/01/2020 1429   ALT 18 10/14/2020 0955   ALT 8 10/01/2020 1429   BILITOT 0.4 10/14/2020 0955   BILITOT 0.4 10/01/2020 1429       RADIOGRAPHIC STUDIES: I have personally reviewed the radiological images as listed and agreed with the findings in the report. CT CHEST ABDOMEN PELVIS W CONTRAST  Result Date: 10/03/2020 CLINICAL DATA:  Metastatic cervical cancer restaging EXAM: CT CHEST, ABDOMEN, AND PELVIS WITH CONTRAST TECHNIQUE: Multidetector CT imaging of the chest, abdomen and pelvis was performed following the standard protocol during bolus administration of intravenous contrast. CONTRAST:  30m ISOVUE-300 IOPAMIDOL (ISOVUE-300) INJECTION 61%, additional oral enteric contrast COMPARISON:  PET-CT, 07/27/2020, PET-CT, 04/19/2020 FINDINGS: CT CHEST FINDINGS Cardiovascular: Right chest port catheter. Normal heart size. No pericardial effusion. Mediastinum/Nodes: No enlarged mediastinal, hilar, or axillary lymph nodes. Thyroid gland, trachea, and esophagus demonstrate no significant findings. Lungs/Pleura: There are multiple  bilateral pulmonary nodules, generally subsolid in composition. The majority of nodules are diminished in size, for example a nodule of the medial posterior right upper lobe which has been reduced to an elongated residual measuring approximately 0.9 x 0.3 cm, previously 0.9 x 0.6 cm (series 4, image 45). A small nodule of the central left upper lobe previously seen is almost entirely resolved, measuring no greater than 1-2 mm (series 4, image 60). A nodule of the dependent superior segment left lower lobe however appears to be slightly increased in size, measuring 1.2 x 0.9 cm, previously 0.9 x 0.9 cm when measured similarly (series 4, image 47). No pleural effusion or pneumothorax. Musculoskeletal: No chest wall mass or suspicious bone lesions identified. CT ABDOMEN  PELVIS FINDINGS Hepatobiliary: No solid liver abnormality is seen. No gallstones, gallbladder wall thickening, or biliary dilatation. Pancreas: Unremarkable. No pancreatic ductal dilatation or surrounding inflammatory changes. Spleen: Normal in size without significant abnormality. Adrenals/Urinary Tract: Adrenal glands are unremarkable. There is redemonstrated severe left hydronephrosis, worsened compared to prior examination, with relative atrophy and cortical hypoenhancement of the left kidney. There is new moderate right hydronephrosis and hydroureter. The distal ureters are obstructed in the pelvis by soft tissue about the uterine cervix (series 2, image 110). Thickening of the urinary bladder wall. Stomach/Bowel: Stomach is within normal limits. Appendix appears normal. No evidence of bowel wall thickening, distention, or inflammatory changes. Vascular/Lymphatic: No significant vascular findings are present. No significant interval change in subcentimeter retroperitoneal lymph nodes, measuring up to 1.0 x 0.6 cm and previously FDG avid (series 2, image 76) Reproductive: Heterogeneous appearance and enhancement of the uterine fundus (series 8,  image 112). Circumferential soft tissue thickening about the cervix and lower uterine segment, overall dimensions approximately 5.5 x 3.5 cm, not significantly changed (series 2, image 108). There is loss of fat plane to the adjacent posterior urinary bladder wall and thickening of the bladder wall. Perirectal and presacral soft tissue stranding, slightly increased compared to prior examination. Other: No abdominal wall hernia or abnormality. No abdominopelvic ascites. Musculoskeletal: Redemonstrated sclerotic osseous lesion of the right ischial tuberosity (series 2, image 119). IMPRESSION: 1. There are multiple bilateral pulmonary nodules, generally subsolid in composition. The majority of nodules are diminished in size, or even completely resolved. A nodule of the dependent superior segment left lower lobe however appears to be slightly increased in size, measuring 1.2 x 0.9 cm, previously 0.9 x 0.9 cm when measured similarly. Findings are generally consistent with treatment response of pulmonary metastatic disease, however with some evidence of mixed response. 2. No significant interval change in previously FDG avid subcentimeter retroperitoneal lymph nodes, consistent with nodal metastatic disease. 3. Redemonstrated sclerotic osseous lesion of the right ischial tuberosity, not significant changed. No new osseous lesions identified by CT. 4. Circumferential soft tissue thickening about the cervix and lower uterine segment, overall dimensions approximately 5.5 x 3.5 cm, not significantly changed, consistent with primary cervical malignancy. There is loss of fat plane to the adjacent posterior urinary bladder wall and thickening of the bladder wall, suggesting direct invasion. 5. Perirectal and presacral soft tissue stranding, slightly increased compared to prior examination and consistent with developing radiation change. 6. Worsened, severe left hydronephrosis and new, moderate right hydronephrosis secondary to  obstruction by cervical mass. Electronically Signed   By: Eddie Candle M.D.   On: 10/03/2020 18:27   DG C-Arm 1-60 Min-No Report  Result Date: 10/10/2020 Fluoroscopy was utilized by the requesting physician.  No radiographic interpretation.

## 2020-10-20 ENCOUNTER — Other Ambulatory Visit (HOSPITAL_COMMUNITY): Payer: Self-pay

## 2020-10-20 ENCOUNTER — Telehealth: Payer: Self-pay | Admitting: Hematology and Oncology

## 2020-10-20 ENCOUNTER — Inpatient Hospital Stay: Payer: Managed Care, Other (non HMO)

## 2020-10-20 ENCOUNTER — Inpatient Hospital Stay: Payer: Managed Care, Other (non HMO) | Admitting: Hematology and Oncology

## 2020-10-20 ENCOUNTER — Other Ambulatory Visit: Payer: Self-pay | Admitting: Hematology and Oncology

## 2020-10-20 MED ORDER — HYDROMORPHONE HCL 4 MG PO TABS
4.0000 mg | ORAL_TABLET | ORAL | 0 refills | Status: DC | PRN
  Filled 2020-10-20: qty 60, 10d supply, fill #0

## 2020-10-20 MED ORDER — MORPHINE SULFATE ER 30 MG PO TBCR
EXTENDED_RELEASE_TABLET | ORAL | 0 refills | Status: DC
  Filled 2020-10-20: qty 90, 30d supply, fill #0

## 2020-10-20 NOTE — Telephone Encounter (Signed)
I called the patient to evaluate her pain control with recent changes in her pain medications With MS Contin 30 mg 3 times a day, her pain control has improved She continue to take Dilaudid as needed 2 to 4 mg every 4 hours as needed She will run out of her pain medicine by tomorrow I will go ahead and refill her MS Contin and Dilaudid 4 mg to [facility] 2 of her daughters also weigh in in a conference call regarding treatment options At the end of discussion, she is undecided about what to do She will have further discussion with family about plan of care and will call me back next week for final decision

## 2020-10-29 ENCOUNTER — Telehealth: Payer: Self-pay | Admitting: *Deleted

## 2020-10-29 ENCOUNTER — Telehealth: Payer: Self-pay

## 2020-10-29 NOTE — Telephone Encounter (Signed)
Patient called to see why her appts have been canceled. Explained per Dr Alvy Bimler note "looks like the appts were canceled until the patient made a final decision." Explained that Dr Marland Kitchen, Dr Denman George and Dr Sondra Come are all still her doctors and she can call anytime

## 2020-10-29 NOTE — Telephone Encounter (Signed)
Pt LM requesting a return call regarding "an appointment".  I have called the pt back to for clarification and she states she wanted to know if Dr. Christiana Pellant would still be her doctor if she needed her, though she does not want tx right now. I advised, yes. Pt expressed understanding of this information and states she will call back to schedule an appt when she is ready.

## 2020-11-02 ENCOUNTER — Ambulatory Visit: Payer: Managed Care, Other (non HMO) | Admitting: Gynecologic Oncology

## 2020-11-05 ENCOUNTER — Telehealth: Payer: Self-pay

## 2020-11-05 NOTE — Telephone Encounter (Signed)
Called and scheduled appt on 6/7. She does not want any further chemo treatment. She would like palliative care. She wants to wait on palliative care referral until she sees Dr. Alvy Bimler. She will call the office back when she needs a refill on Rx.

## 2020-11-05 NOTE — Telephone Encounter (Signed)
She called and left a message. She would like to schedule appt with Dr. Alvy Bimler and continue her care with Dr. Alvy Bimler. She will not be going anywhere else for treatment.

## 2020-11-05 NOTE — Telephone Encounter (Signed)
The earliest I can see her is on 6/7 at 130 pm Has she considered palliative care/hospice

## 2020-11-08 ENCOUNTER — Telehealth: Payer: Self-pay

## 2020-11-08 NOTE — Telephone Encounter (Signed)
-----   Message from [name], MD sent at 11/08/2020 10:50 AM EDT ----- Regarding: RE: Pain Medication Tell her to double her dilaudid to 2 pills each time Schedule to see me tomorrow at 1215 pm, 20 mins ----- Message ----- From: Rennis Harding, RN Sent: 11/08/2020  10:41 AM EDT To: [name], MD Subject: Pain Medication                                Pain medication management not being effective per patient.   Patient utilizing medication as prescribed.    Dilaudid 4mg  Q 4 hours as needed (pt is having to take exactly every 4 hours, and pain is present before 4 hours)    Pt has around 15 pills left  MS Contin 30 mg Q 12 hours -  Patient has 1 week supply left.   Reporting pain staying around a 6; however this weekend it was up to 9/10.

## 2020-11-08 NOTE — Telephone Encounter (Signed)
Patient notified, verbalized understanding with teach back method.    Pt scheduled and aware of appointment with MD.

## 2020-11-09 ENCOUNTER — Other Ambulatory Visit: Payer: Self-pay

## 2020-11-09 ENCOUNTER — Telehealth: Payer: Self-pay

## 2020-11-09 ENCOUNTER — Encounter: Payer: Self-pay | Admitting: Hematology and Oncology

## 2020-11-09 ENCOUNTER — Other Ambulatory Visit (HOSPITAL_COMMUNITY): Payer: Self-pay

## 2020-11-09 ENCOUNTER — Inpatient Hospital Stay (HOSPITAL_BASED_OUTPATIENT_CLINIC_OR_DEPARTMENT_OTHER): Payer: Managed Care, Other (non HMO) | Admitting: Hematology and Oncology

## 2020-11-09 DIAGNOSIS — Z7189 Other specified counseling: Secondary | ICD-10-CM

## 2020-11-09 DIAGNOSIS — F064 Anxiety disorder due to known physiological condition: Secondary | ICD-10-CM

## 2020-11-09 DIAGNOSIS — G893 Neoplasm related pain (acute) (chronic): Secondary | ICD-10-CM | POA: Diagnosis not present

## 2020-11-09 DIAGNOSIS — K5909 Other constipation: Secondary | ICD-10-CM

## 2020-11-09 DIAGNOSIS — C530 Malignant neoplasm of endocervix: Secondary | ICD-10-CM | POA: Diagnosis not present

## 2020-11-09 DIAGNOSIS — C7951 Secondary malignant neoplasm of bone: Secondary | ICD-10-CM | POA: Diagnosis not present

## 2020-11-09 DIAGNOSIS — C539 Malignant neoplasm of cervix uteri, unspecified: Secondary | ICD-10-CM | POA: Diagnosis not present

## 2020-11-09 MED ORDER — MORPHINE SULFATE ER 60 MG PO TBCR
60.0000 mg | EXTENDED_RELEASE_TABLET | Freq: Three times a day (TID) | ORAL | 0 refills | Status: AC
  Filled 2020-11-09: qty 90, 30d supply, fill #0

## 2020-11-09 MED ORDER — LORAZEPAM 0.5 MG PO TABS
0.5000 mg | ORAL_TABLET | Freq: Three times a day (TID) | ORAL | 0 refills | Status: AC | PRN
  Filled 2020-11-09: qty 60, 20d supply, fill #0

## 2020-11-09 MED ORDER — HYDROMORPHONE HCL 8 MG PO TABS
8.0000 mg | ORAL_TABLET | ORAL | 0 refills | Status: AC | PRN
  Filled 2020-11-09: qty 90, 15d supply, fill #0

## 2020-11-09 NOTE — Assessment & Plan Note (Signed)
I have reviewed recommendation from [place] The patient has made informed decision not to pursue further palliative treatment She wants aggressive supportive care and is in agreement for hospice referral Today, I have discontinue a lot of unnecessary medications I have increased her pain medicine dramatically for better symptomatic management and pain control I will get my nurse to check on her in 2 days I will schedule a virtual visit next week for formal assessment of pain control

## 2020-11-09 NOTE — Assessment & Plan Note (Signed)
She has significant uncontrolled bone pain She will continue aggressive pain management along with prednisone therapy

## 2020-11-09 NOTE — Assessment & Plan Note (Signed)
She has poorly controlled pain At the time when I saw her, she has just taken Dilaudid and she rated her pain at 6 out of 10 I recommend increasing her morphine sulfate to 60 mg 3 times a day and to increase Dilaudid to 8 mg as needed She will continue prednisone therapy to help with bone pain She is aware about risk of constipation

## 2020-11-09 NOTE — Telephone Encounter (Signed)
Called Hospice referral to [facility]. Spoke with Manus Gunning. They will reach out to [facility].

## 2020-11-09 NOTE — Assessment & Plan Note (Signed)
The patient is fearful of being left alone I recommend her husband to consider taking time off work to focus on her care I will get my nurse to check on her in 2 days and I will talk to her again next week as part of the virtual visit We will get her enrolled with hospice care I refill her prescription lorazepam to take as needed for anxiety

## 2020-11-09 NOTE — Assessment & Plan Note (Signed)
She is in agreement for hospice referral She is aware of her terminal nature of her disease and poor prognosis

## 2020-11-09 NOTE — Assessment & Plan Note (Signed)
We discussed the importance of aggressive laxative therapy 

## 2020-11-09 NOTE — Progress Notes (Signed)
[facility] OFFICE PROGRESS NOTE  Patient Care Team: [name], MD as PCP - General (Family Medicine) [name], MD as Consulting Physician (Obstetrics and Gynecology) [name], RN as Oncology Nurse Navigator (Oncology)  ASSESSMENT & PLAN:  Malignant neoplasm of cervix [facility]) I have reviewed recommendation from [place] The patient has made informed decision not to pursue further palliative treatment She wants aggressive supportive care and is in agreement for hospice referral Today, I have discontinue a lot of unnecessary medications I have increased her pain medicine dramatically for better symptomatic management and pain control I will get my nurse to check on her in 2 days I will schedule a virtual visit next week for formal assessment of pain control  Metastasis to bone [facility]) She has significant uncontrolled bone pain She will continue aggressive pain management along with prednisone therapy  Cancer associated pain She has poorly controlled pain At the time when I saw her, she has just taken Dilaudid and she rated her pain at 6 out of 10 I recommend increasing her morphine sulfate to 60 mg 3 times a day and to increase Dilaudid to 8 mg as needed She will continue prednisone therapy to help with bone pain She is aware about risk of constipation  Other constipation We discussed the importance of aggressive laxative therapy  Goals of care, counseling/discussion She is in agreement for hospice referral She is aware of her terminal nature of her disease and poor prognosis  Anxiety disorder due to medical condition The patient is fearful of being left alone I recommend her husband to consider taking time off work to focus on her care I will get my nurse to check on her in 2 days and I will talk to her again next week as part of the virtual visit We will get her enrolled with hospice care I refill her prescription lorazepam to take as needed for  anxiety   No orders of the defined types were placed in this encounter.   All questions were answered. The patient knows to call the clinic with any problems, questions or concerns. The total time spent in the appointment was 40 minutes encounter with patients including review of chart and various tests results, discussions about plan of care and coordination of care plan   [name], MD 11/09/2020 3:30 PM  INTERVAL HISTORY: Please see below for problem oriented charting. She is seen urgently due to uncontrolled pain Yesterday, I told her to increase her Dilaudid from 4 mg to 8 mg as needed She has taken her 6 mg of Dilaudid around 11:00 and by the time I saw her at 12 PM, she rated her pain at worst, 6 out of 10 pain She is taking morphine sulfate at 6 AM, 2 PM and 11 PM and it is not controlling her pain She has occasional spontaneous nosebleed that is self-limiting She continues to have brown vaginal discharge She had mild intermittent constipation She has difficulties with urination She lies in bed most of the time due to uncontrolled pain if she were to sit down or walk  SUMMARY OF ONCOLOGIC HISTORY: Oncology History Overview Note  PD-L1 CPS: 2   Malignant neoplasm of cervix ([place])  03/16/2020 Initial Diagnosis   The patient had history of new onset vaginal spotting on March 16, 2020.  She immediately called her OB/GYN's office and achieved an appointment with her OB/GYN's partner, Dr. Murrell Redden, who saw and evaluated the patient on March 19, 2020.  At that time a transvaginal ultrasound scan was performed which revealed a uterus measuring 5.3 x 8.4 x 4.6 cm with an ill-defined endometrium.  There is a complex cyst on the left ovary measuring 2.6 cm with peripheral blood flow noted.  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  Immunostains were positive for CEA and  CDX2, negative for p16, ER, vimentin, and PAX8.  The differential diagnosis included primary gastric type endocervical adenocarcinoma (though this typically stains positive for PAX8) or a metastasis from pancreaticobiliary or upper GI primary.   03/29/2020 Pathology Results   1. Surgical [P], duodenum - PEPTIC DUODENITIS. - NO DYSPLASIA OR MALIGNANCY. 2. Surgical [P], gastric antrum and gastric body - REACTIVE GASTROPATHY. [place] IS NEGATIVE FOR HELICOBACTER PYLORI. - NO INTESTINAL METAPLASIA, DYSPLASIA, OR MALIGNANCY. 3. Surgical [P], gastric nodule - MILD REACTIVE GASTROPATHY. - NO INTESTINAL METAPLASIA, DYSPLASIA, OR MALIGNANCY. 4. Surgical [P], stomach, lesser curve ulcers - REACTIVE GASTROPATHY WITH EROSIONS. - NO INTESTINAL METAPLASIA, DYSPLASIA, OR MALIGNANCY. 5. Surgical [P], gastric polyps - HYPERPLASTIC POLYP. - NO INTESTINAL METAPLASIA, DYSPLASIA OR MALIGNANCY. 6. Surgical [P], colon, descending, polyp - HYPERPLASTIC POLYP. - NO DYSPLASIA OR MALIGNANCY.   04/06/2020 Procedure   EGD report  Normal esophagus. - A few gastric polyps. Suspected fundic gland polyps. - Non-bleeding gastric ulcers with pigmented material. Biopsied. - A single gastric polyp/nodule. Resected and retrieved. - Erythematous duodenopathy. Biopsied. - The examination was otherwise normal.   04/06/2020 Procedure   Colonoscopy report - Non-bleeding internal hemorrhoids. - One 4 mm polyp in the descending colon, removed with a cold snare. Resected and retrieved. - The examination was otherwise normal on direct and retroflexion views   04/19/2020 PET scan   1. Hypermetabolic uterine cervix mass compatible with known primary cervical malignancy, with hypermetabolism extending into the upper third of the vagina and extending throughout the uterine body. No frank extrauterine extension. 2. Mild to moderate left hydroureteronephrosis to the level of the left UVJ, concerning for tumor  involvement of the left UVJ. 3. Hypermetabolic right common iliac nodal metastasis. 4. Hypermetabolic sclerotic right ischial bone metastasis. 5. Two indeterminate small scattered solid pulmonary nodules, below PET resolution, warranting attention on chest CT follow-up in 3 months. 6.  Aortic Atherosclerosis (ICD10-I70.0).   04/19/2020 Cancer Staging   Staging form: Cervix Uteri, AJCC Version 9 - Clinical stage from 04/19/2020: FIGO Stage IVB (cT3b, cN1a, cM1) - Signed by [name], MD on 04/19/2020   04/26/2020 Procedure   Successful placement of a right IJ approach Power Port with ultrasound and fluoroscopic guidance. The catheter is ready for use   04/30/2020 - 05/21/2020 Chemotherapy   The patient had cisplatin for chemotherapy treatment.     06/20/2020 Genetic Testing   Negative genetic testing on the CancerNext-Expanded+RNAinsight panel.  The CancerNext-Expanded gene panel offered by [facility] and includes sequencing and rearrangement analysis for the following 77 genes: AIP, ALK, APC*, ATM*, AXIN2, BAP1, BARD1, BLM, BMPR1A, BRCA1*, BRCA2*, BRIP1*, CDC73, CDH1*, CDK4, CDKN1B, CDKN2A, CHEK2*, CTNNA1, DICER1, FANCC, FH, FLCN, GALNT12, KIF1B, LZTR1, MAX, MEN1, MET, MLH1*, MSH2*, MSH3, MSH6*, MUTYH*, NBN, NF1*, NF2, NTHL1, PALB2*, PHOX2B, PMS2*, POT1, PRKAR1A, PTCH1, PTEN*, RAD51C*, RAD51D*, RB1, RECQL, RET, SDHA, SDHAF2, SDHB, SDHC, SDHD, SMAD4, SMARCA4, SMARCB1, SMARCE1, STK11, SUFU, TMEM127, TP53*, TSC1, TSC2, VHL and XRCC2 (sequencing and deletion/duplication); EGFR, EGLN1, HOXB13, KIT, MITF, PDGFRA, POLD1, and POLE (sequencing only); EPCAM and GREM1 (deletion/duplication only). DNA and RNA  analyses performed for * genes. The report date is June 20, 2020.   07/27/2020 PET scan   1. Enlarging and new bilateral pulmonary nodules are evident today. Larger nodules in the right upper lung show FDG accumulation. Imaging features compatible with metastatic disease. 2. New hypermetabolic lymph  nodes identified in the retrocaval space of the upper abdomen and para-aortic space of the lower abdomen, consistent with new metastatic involvement. There is some persistent hypermetabolism associated with small lymph nodes along the right common iliac chain. 3. Interval decrease in hypermetabolism associated with the cervix. 4. Persistent, mildly progressive left hydroureteronephrosis. Left ureter is dilated down to the level it passes the cervix, just proximal to the UVJ. 5. Decreased hypermetabolism in the previously identified right ischial tuberosity lesion consistent with metastatic disease.  6. New punctate focus of hypermetabolism in the gastric fundus without underlying mass lesion evident by CT. Attention on follow-up recommended. 7. Tiny gas bubble in the bladder lumen is presumably secondary to recent instrumentation although bladder infection could have this appearance.   07/28/2020 -  Chemotherapy    Patient is on Treatment Plan: CERVICAL PEMBROLIZUMAB/CARBOPLATIN + PACLITAXEL + BEVACIZUMAB Q21D       10/04/2020 Imaging   1. There are multiple bilateral pulmonary nodules, generally subsolid in composition. The majority of nodules are diminished in size, or even completely resolved. A nodule of the dependent superior segment left lower lobe however appears to be slightly increased in size, measuring 1.2 x 0.9 cm, previously 0.9 x 0.9 cm when measured similarly. Findings are generally consistent with treatment response of pulmonary metastatic disease, however with some evidence of mixed response. 2. No significant interval change in previously FDG avid subcentimeter retroperitoneal lymph nodes, consistent with nodal metastatic disease. 3. Redemonstrated sclerotic osseous lesion of the right ischial tuberosity, not significant changed. No new osseous lesions identified by CT. 4. Circumferential soft tissue thickening about the cervix and lower uterine segment, overall dimensions  approximately 5.5 x 3.5 cm, not significantly changed, consistent with primary cervical malignancy. There is loss of fat plane to the adjacent posterior urinary bladder wall and thickening of the bladder wall, suggesting direct invasion. 5. Perirectal and presacral soft tissue stranding, slightly increased compared to prior examination and consistent with developing radiation change. 6. Worsened, severe left hydronephrosis and new, moderate right hydronephrosis secondary to obstruction by cervical mass.     10/07/2020 - 10/14/2020 Hospital Admission   She was admitted to the hospital due to severe vaginal bleeding requiring multiple units of blood transfusion.  She also had cystoscopy with right ureteral stent placement and left diagnostic ureteroscopy while in the hospital She also have significant pain requiring multiple adjustment of pain medicine   10/10/2020 Surgery   Preoperative diagnosis:  1.  Bilateral hydronephrosis secondary to extrinsic compression   Postoperative diagnosis: 1.  Bilateral hydronephrosis secondary to extrinsic compression 2.  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  Cystoscopy with right retrograde pyelogram with right ureteral stent placement 2.  Left diagnostic ureteroscopy   Surgeon: Link Snuffer, MD    Drains/Catheters: 1.  Right 6 x 24 double-J ureteral stent 2.  Foley catheter   Intraoperative findings: 1.  Normal urethra 2.  Significant trabeculation within the bladder.  Low capacity bladder that seems somewhat noncompliant questionable secondary to her history of radiation. 3.  Right retrograde pyelogram revealed severe hydroureteronephrosis.  Successful placement of right ureteral stent 4.  Difficult to locate left ureteral orifice.  It looked  like the distal ureter was completely occluded questionably by tumor?     Metastasis to bone ([place])  04/19/2020 Initial Diagnosis   Metastasis to bone ([place])   07/28/2020 -   Chemotherapy    Patient is on Treatment Plan: CERVICAL PEMBROLIZUMAB/CARBOPLATIN + PACLITAXEL + BEVACIZUMAB Q21D         REVIEW OF SYSTEMS:   Constitutional: Denies fevers, chills or abnormal weight loss Eyes: Denies blurriness of vision Ears, nose, mouth, throat, and face: Denies mucositis or sore throat Respiratory: Denies cough, dyspnea or wheezes Cardiovascular: Denies palpitation, chest discomfort or lower extremity swelling Skin: Denies abnormal skin rashes Lymphatics: Denies new lymphadenopathy or easy bruising Neurological:Denies numbness, tingling or new weaknesses Behavioral/Psych: Mood is stable, no new changes  All other systems were reviewed with the patient and are negative.  I have reviewed the past medical history, past surgical history, social history and family history with the patient and they are unchanged from previous note.  ALLERGIES:  is allergic to penicillins.  MEDICATIONS:  Current Outpatient Medications  Medication Sig Dispense Refill  . HYDROmorphone (DILAUDID) 8 MG tablet Take 1 tablet (8 mg total) by mouth every 4 (four) hours as needed for severe pain. 90 tablet 0  . LORazepam (ATIVAN) 0.5 MG tablet Take 1 tablet (0.5 mg total) by mouth every 8 (eight) hours as needed for anxiety. 60 tablet 0  . morphine (MS CONTIN) 60 MG 12 hr tablet Take 1 tablet (60 mg total) by mouth in the morning, at noon, and at bedtime. TAKE 1 TABLET BY MOUTH 3 TIMES DAILY 90 tablet 0  . pantoprazole (PROTONIX) 40 MG tablet Take 1 tablet (40 mg total) by mouth 2 (two) times daily. 90 tablet 3  . polyethylene glycol (MIRALAX / GLYCOLAX) 17 g packet Dissolve 17 g in water or juice and drink once daily. 14 each 0  . predniSONE (DELTASONE) 20 MG tablet Take 2 tablets (40 mg total) by mouth daily with breakfast. 60 tablet 1  . prochlorperazine (COMPAZINE) 10 MG tablet TAKE 1 TABLET BY MOUTH EVERY 6 HOURS AS NEEDED FOR NAUSEA AND/OR VOMITING (Patient taking differently: Take 10 mg by  mouth every 6 (six) hours as needed.) 90 tablet 1  . senna-docusate (SENOKOT-S) 8.6-50 MG tablet Take 2 tablets by mouth 2 (two) times daily. 30 tablet 0   No current facility-administered medications for this visit.    PHYSICAL EXAMINATION: ECOG PERFORMANCE STATUS: 3 - Symptomatic, >50% confined to bed  Vitals:   11/09/20 1155  BP: 130/62  Pulse: (!) 106  Resp: 20  Temp: 97.9 F (36.6 C)  SpO2: 100%   Filed Weights    GENERAL:alert, appears in pain  NEURO: alert & oriented x 3 with fluent speech, no focal motor/sensory deficits.  She appears anxious and fearful  LABORATORY DATA:  I have reviewed the data as listed    Component Value Date/Time   NA 139 10/18/2020 1241   NA 140 06/21/2016 1530   K 4.0 10/18/2020 1241   CL 101 10/18/2020 1241   CO2 28 10/18/2020 1241   GLUCOSE 166 (H) 10/18/2020 1241   BUN 19 10/18/2020 1241   BUN 16 06/21/2016 1530   CREATININE 1.10 (H) 10/18/2020 1241   CREATININE 0.87 07/02/2017 1549   CALCIUM 9.3 10/18/2020 1241   PROT 6.3 (L) 10/18/2020 1241   PROT 7.3 06/21/2016 1530   ALBUMIN 2.8 (L) 10/18/2020 1241   ALBUMIN 4.3 06/21/2016 1530   AST 11 (L) 10/18/2020 1241   ALT 13  10/18/2020 1241   ALKPHOS 63 10/18/2020 1241   BILITOT 0.5 10/18/2020 1241   GFRNONAA 56 (L) 10/18/2020 1241   GFRAA 111 06/21/2016 1530    No results found for: SPEP, UPEP  Lab Results  Component Value Date   WBC 10.7 (H) 10/18/2020   NEUTROABS 9.9 (H) 10/18/2020   HGB 9.5 (L) 10/18/2020   HCT 29.5 (L) 10/18/2020   MCV 87.8 10/18/2020   PLT 82 (L) 10/18/2020      Chemistry      Component Value Date/Time   NA 139 10/18/2020 1241   NA 140 06/21/2016 1530   K 4.0 10/18/2020 1241   CL 101 10/18/2020 1241   CO2 28 10/18/2020 1241   BUN 19 10/18/2020 1241   BUN 16 06/21/2016 1530   CREATININE 1.10 (H) 10/18/2020 1241   CREATININE 0.87 07/02/2017 1549      Component Value Date/Time   CALCIUM 9.3 10/18/2020 1241   ALKPHOS 63 10/18/2020 1241    AST 11 (L) 10/18/2020 1241   ALT 13 10/18/2020 1241   BILITOT 0.5 10/18/2020 1241

## 2020-11-10 ENCOUNTER — Other Ambulatory Visit (HOSPITAL_COMMUNITY): Payer: Self-pay

## 2020-11-11 ENCOUNTER — Telehealth: Payer: Self-pay

## 2020-11-11 NOTE — Telephone Encounter (Signed)
Called and given below message. She verbalized understanding. She started the dosage yesterday and her pain is better today. Hospice is coming out this afternoon to admit. Instructed to call the office if needed. She verbalized understanding.

## 2020-11-11 NOTE — Telephone Encounter (Signed)
-----   Message from [name], MD sent at 11/11/2020  9:07 AM EDT ----- Can you call her and ask how is her pain?

## 2020-11-14 ENCOUNTER — Emergency Department (HOSPITAL_COMMUNITY): Payer: Managed Care, Other (non HMO)

## 2020-11-14 ENCOUNTER — Encounter (HOSPITAL_COMMUNITY): Payer: Self-pay

## 2020-11-14 ENCOUNTER — Other Ambulatory Visit: Payer: Self-pay

## 2020-11-14 ENCOUNTER — Inpatient Hospital Stay (HOSPITAL_COMMUNITY)
Admission: EM | Admit: 2020-11-14 | Discharge: 2020-11-16 | DRG: 809 | Disposition: A | Discharge status: Hospice | Payer: Managed Care, Other (non HMO) | Attending: Internal Medicine | Admitting: Internal Medicine

## 2020-11-14 DIAGNOSIS — N390 Urinary tract infection, site not specified: Secondary | ICD-10-CM

## 2020-11-14 DIAGNOSIS — F419 Anxiety disorder, unspecified: Secondary | ICD-10-CM | POA: Diagnosis present

## 2020-11-14 DIAGNOSIS — Z8049 Family history of malignant neoplasm of other genital organs: Secondary | ICD-10-CM | POA: Diagnosis not present

## 2020-11-14 DIAGNOSIS — D61818 Other pancytopenia: Principal | ICD-10-CM | POA: Diagnosis present

## 2020-11-14 DIAGNOSIS — Z66 Do not resuscitate: Secondary | ICD-10-CM | POA: Diagnosis present

## 2020-11-14 DIAGNOSIS — Z923 Personal history of irradiation: Secondary | ICD-10-CM | POA: Diagnosis not present

## 2020-11-14 DIAGNOSIS — G893 Neoplasm related pain (acute) (chronic): Secondary | ICD-10-CM | POA: Diagnosis present

## 2020-11-14 DIAGNOSIS — R52 Pain, unspecified: Secondary | ICD-10-CM

## 2020-11-14 DIAGNOSIS — Z8616 Personal history of COVID-19: Secondary | ICD-10-CM

## 2020-11-14 DIAGNOSIS — Z515 Encounter for palliative care: Secondary | ICD-10-CM

## 2020-11-14 DIAGNOSIS — Z7952 Long term (current) use of systemic steroids: Secondary | ICD-10-CM

## 2020-11-14 DIAGNOSIS — L930 Discoid lupus erythematosus: Secondary | ICD-10-CM | POA: Diagnosis present

## 2020-11-14 DIAGNOSIS — M329 Systemic lupus erythematosus, unspecified: Secondary | ICD-10-CM | POA: Diagnosis present

## 2020-11-14 DIAGNOSIS — K5909 Other constipation: Secondary | ICD-10-CM | POA: Diagnosis present

## 2020-11-14 DIAGNOSIS — E785 Hyperlipidemia, unspecified: Secondary | ICD-10-CM | POA: Diagnosis present

## 2020-11-14 DIAGNOSIS — R319 Hematuria, unspecified: Secondary | ICD-10-CM | POA: Diagnosis present

## 2020-11-14 DIAGNOSIS — K219 Gastro-esophageal reflux disease without esophagitis: Secondary | ICD-10-CM | POA: Diagnosis present

## 2020-11-14 DIAGNOSIS — C539 Malignant neoplasm of cervix uteri, unspecified: Secondary | ICD-10-CM | POA: Diagnosis present

## 2020-11-14 DIAGNOSIS — E876 Hypokalemia: Secondary | ICD-10-CM | POA: Diagnosis present

## 2020-11-14 DIAGNOSIS — E559 Vitamin D deficiency, unspecified: Secondary | ICD-10-CM | POA: Diagnosis present

## 2020-11-14 DIAGNOSIS — R531 Weakness: Secondary | ICD-10-CM | POA: Diagnosis not present

## 2020-11-14 DIAGNOSIS — C7951 Secondary malignant neoplasm of bone: Secondary | ICD-10-CM | POA: Diagnosis present

## 2020-11-14 DIAGNOSIS — Z79899 Other long term (current) drug therapy: Secondary | ICD-10-CM | POA: Diagnosis not present

## 2020-11-14 DIAGNOSIS — R32 Unspecified urinary incontinence: Secondary | ICD-10-CM

## 2020-11-14 DIAGNOSIS — Z7189 Other specified counseling: Secondary | ICD-10-CM | POA: Diagnosis not present

## 2020-11-14 DIAGNOSIS — N1330 Unspecified hydronephrosis: Secondary | ICD-10-CM

## 2020-11-14 DIAGNOSIS — K5900 Constipation, unspecified: Secondary | ICD-10-CM | POA: Diagnosis not present

## 2020-11-14 DIAGNOSIS — Z8541 Personal history of malignant neoplasm of cervix uteri: Secondary | ICD-10-CM

## 2020-11-14 DIAGNOSIS — D649 Anemia, unspecified: Secondary | ICD-10-CM | POA: Diagnosis not present

## 2020-11-14 LAB — CBC WITH DIFFERENTIAL/PLATELET
Abs Immature Granulocytes: 0.15 10*3/uL — ABNORMAL HIGH (ref 0.00–0.07)
Basophils Absolute: 0 10*3/uL (ref 0.0–0.1)
Basophils Relative: 0 %
Eosinophils Absolute: 0 10*3/uL (ref 0.0–0.5)
Eosinophils Relative: 0 %
HCT: 26.1 % — ABNORMAL LOW (ref 36.0–46.0)
Hemoglobin: 8.2 g/dL — ABNORMAL LOW (ref 12.0–15.0)
Immature Granulocytes: 2 %
Lymphocytes Relative: 7 %
Lymphs Abs: 0.7 10*3/uL (ref 0.7–4.0)
MCH: 28.3 pg (ref 26.0–34.0)
MCHC: 31.4 g/dL (ref 30.0–36.0)
MCV: 90 fL (ref 80.0–100.0)
Monocytes Absolute: 0.5 10*3/uL (ref 0.1–1.0)
Monocytes Relative: 5 %
Neutro Abs: 8.2 10*3/uL — ABNORMAL HIGH (ref 1.7–7.7)
Neutrophils Relative %: 86 %
Platelets: 95 10*3/uL — ABNORMAL LOW (ref 150–400)
RBC: 2.9 MIL/uL — ABNORMAL LOW (ref 3.87–5.11)
RDW: 18.6 % — ABNORMAL HIGH (ref 11.5–15.5)
WBC: 9.4 10*3/uL (ref 4.0–10.5)
nRBC: 0.4 % — ABNORMAL HIGH (ref 0.0–0.2)

## 2020-11-14 LAB — URINALYSIS, ROUTINE W REFLEX MICROSCOPIC
Bacteria, UA: NONE SEEN
Bilirubin Urine: NEGATIVE
Glucose, UA: NEGATIVE mg/dL
Ketones, ur: NEGATIVE mg/dL
Nitrite: NEGATIVE
Protein, ur: 100 mg/dL — AB
RBC / HPF: >50 RBC/hpf — ABNORMAL HIGH (ref 0–5)
Specific Gravity, Urine: 1.011 (ref 1.005–1.030)
pH: 9 — ABNORMAL HIGH (ref 5.0–8.0)

## 2020-11-14 LAB — HEMOGLOBIN AND HEMATOCRIT, BLOOD
HCT: 26.8 % — ABNORMAL LOW (ref 36.0–46.0)
HCT: 24.8 % — ABNORMAL LOW (ref 36.0–46.0)
Hemoglobin: 8.2 g/dL — ABNORMAL LOW (ref 12.0–15.0)
Hemoglobin: 7.7 g/dL — ABNORMAL LOW (ref 12.0–15.0)

## 2020-11-14 LAB — BASIC METABOLIC PANEL
Anion gap: 12 (ref 5–15)
BUN: 23 mg/dL (ref 8–23)
CO2: 25 mmol/L (ref 22–32)
Calcium: 10.1 mg/dL (ref 8.9–10.3)
Chloride: 101 mmol/L (ref 98–111)
Creatinine, Ser: 1.09 mg/dL — ABNORMAL HIGH (ref 0.44–1.00)
GFR, Estimated: 57 mL/min — ABNORMAL LOW (ref >60)
Glucose, Bld: 116 mg/dL — ABNORMAL HIGH (ref 70–99)
Potassium: 3.6 mmol/L (ref 3.5–5.1)
Sodium: 138 mmol/L (ref 135–145)

## 2020-11-14 LAB — SARS CORONAVIRUS 2 (TAT 6-24 HRS): SARS Coronavirus 2: NEGATIVE

## 2020-11-14 LAB — LACTIC ACID, PLASMA: Lactic Acid, Venous: 1.5 mmol/L (ref 0.5–1.9)

## 2020-11-14 MED ORDER — HYDROMORPHONE HCL 2 MG/ML IJ SOLN: 2.0000 mg | Freq: Once | INTRAMUSCULAR | Status: DC

## 2020-11-14 MED ORDER — HYDROMORPHONE HCL 1 MG/ML IJ SOLN
1.0000 mg | Freq: Once | INTRAMUSCULAR | Status: AC
  Administered 2020-11-14: 1 mg via INTRAVENOUS
  Filled 2020-11-14: qty 1

## 2020-11-14 MED ORDER — HYDROMORPHONE HCL 2 MG PO TABS
8.0000 mg | ORAL_TABLET | ORAL | Status: DC | PRN
  Administered 2020-11-14 – 2020-11-15 (×5): 8 mg via ORAL
  Filled 2020-11-14 (×6): qty 4

## 2020-11-14 MED ORDER — SODIUM CHLORIDE 0.9 % IV SOLN
1.0000 g | Freq: Once | INTRAVENOUS | Status: AC
  Administered 2020-11-14: 1 g via INTRAVENOUS
  Filled 2020-11-14: qty 10

## 2020-11-14 MED ORDER — LORAZEPAM 0.5 MG PO TABS
0.5000 mg | ORAL_TABLET | Freq: Three times a day (TID) | ORAL | Status: DC | PRN
  Administered 2020-11-14 – 2020-11-16 (×4): 0.5 mg via ORAL
  Filled 2020-11-14 (×4): qty 1

## 2020-11-14 MED ORDER — POLYETHYLENE GLYCOL 3350 17 G PO PACK
17.0000 g | PACK | Freq: Every day | ORAL | Status: DC
  Administered 2020-11-14 – 2020-11-16 (×3): 17 g via ORAL
  Filled 2020-11-14 (×3): qty 1

## 2020-11-14 MED ORDER — ACETAMINOPHEN 650 MG RE SUPP: 650.0000 mg | Freq: Four times a day (QID) | RECTAL | Status: DC | PRN

## 2020-11-14 MED ORDER — SODIUM CHLORIDE 0.9% FLUSH
10.0000 mL | Freq: Two times a day (BID) | INTRAVENOUS | Status: DC
  Administered 2020-11-14 – 2020-11-15 (×2): 10 mL

## 2020-11-14 MED ORDER — MORPHINE SULFATE ER 30 MG PO TBCR
60.0000 mg | EXTENDED_RELEASE_TABLET | Freq: Three times a day (TID) | ORAL | Status: DC
  Administered 2020-11-14 – 2020-11-16 (×8): 60 mg via ORAL
  Filled 2020-11-14: qty 4
  Filled 2020-11-14 (×3): qty 2
  Filled 2020-11-14: qty 4
  Filled 2020-11-14 (×3): qty 2

## 2020-11-14 MED ORDER — PROCHLORPERAZINE MALEATE 10 MG PO TABS
10.0000 mg | ORAL_TABLET | Freq: Four times a day (QID) | ORAL | Status: DC | PRN
  Administered 2020-11-15: 10 mg via ORAL
  Filled 2020-11-14: qty 1

## 2020-11-14 MED ORDER — SODIUM CHLORIDE 0.9% FLUSH: 10.0000 mL | INTRAVENOUS | Status: DC | PRN

## 2020-11-14 MED ORDER — PANTOPRAZOLE SODIUM 40 MG PO TBEC
40.0000 mg | DELAYED_RELEASE_TABLET | Freq: Two times a day (BID) | ORAL | Status: DC
  Administered 2020-11-14 – 2020-11-16 (×5): 40 mg via ORAL
  Filled 2020-11-14 (×5): qty 1

## 2020-11-14 MED ORDER — SODIUM CHLORIDE 0.9 % IV SOLN
1.0000 g | INTRAVENOUS | Status: DC
  Administered 2020-11-15 – 2020-11-16 (×2): 1 g via INTRAVENOUS
  Filled 2020-11-14 (×2): qty 1

## 2020-11-14 MED ORDER — ACETAMINOPHEN 325 MG PO TABS: 650.0000 mg | ORAL_TABLET | Freq: Four times a day (QID) | ORAL | Status: DC | PRN

## 2020-11-14 MED ORDER — SENNOSIDES-DOCUSATE SODIUM 8.6-50 MG PO TABS
2.0000 | ORAL_TABLET | Freq: Two times a day (BID) | ORAL | Status: DC
  Administered 2020-11-14 – 2020-11-16 (×5): 2 via ORAL
  Filled 2020-11-14 (×5): qty 2

## 2020-11-14 MED ORDER — SODIUM CHLORIDE 0.9 % IV SOLN: INTRAVENOUS | Status: DC

## 2020-11-14 MED ORDER — PREDNISONE 20 MG PO TABS
40.0000 mg | ORAL_TABLET | Freq: Every day | ORAL | Status: DC
  Administered 2020-11-15 – 2020-11-16 (×2): 40 mg via ORAL
  Filled 2020-11-14 (×2): qty 2

## 2020-11-14 MED ORDER — CHLORHEXIDINE GLUCONATE CLOTH 2 % EX PADS
6.0000 | MEDICATED_PAD | Freq: Every day | CUTANEOUS | Status: DC
  Administered 2020-11-14 – 2020-11-16 (×3): 6 via TOPICAL

## 2020-11-14 MED ORDER — LORAZEPAM 2 MG/ML IJ SOLN
1.0000 mg | Freq: Once | INTRAMUSCULAR | Status: AC
  Administered 2020-11-14: 1 mg via INTRAVENOUS
  Filled 2020-11-14: qty 1

## 2020-11-14 NOTE — ED Triage Notes (Signed)
Patient arrives via EMS from home with complaint of vaginal bleeding x 1 hour (pt endorses hx of the same). She reports vaginal pain and left sided back pain 6/10. Pt reports filling 3 pads and 2 towels with blood prior to EMS arrival.   EMS vitals  140/86 104 99% RA CBG 143

## 2020-11-14 NOTE — Progress Notes (Signed)
   11/14/20 2146  Assess: MEWS Score  Temp 99 F (37.2 C)  BP (!) 155/81  Pulse Rate (!) 123  Resp 16  SpO2 100 %  O2 Device Nasal Cannula  Assess: MEWS Score  MEWS Temp 0  MEWS Systolic 0  MEWS Pulse 2  MEWS RR 0  MEWS LOC 0  MEWS Score 2  MEWS Score Color Yellow  Assess: if the MEWS score is Yellow or Red  Were vital signs taken at a resting state? Yes  Treat  Pain Scale Faces  Faces Pain Scale 8  Pain Type Acute pain  Pain Location Vagina  Pain Orientation Lower  Pain Radiating Towards buttock, rectum  Pain Frequency Constant  Pain Onset On-going  Pain Intervention(s) Medication (See eMAR)  Notify: Charge Nurse/RN  Name of Charge Nurse/RN Notified Dellie Catholic, RN  Date Charge Nurse/RN Notified 11/14/20  Time Charge Nurse/RN Notified 2204  Document  Progress note created (see row info) Yes  Assess: SIRS CRITERIA  SIRS Temperature  0  SIRS Pulse 1  SIRS Respirations  0  SIRS WBC 0  SIRS Score Sum  1   Patient medicated for pain and for anxiety. Placed on o2 at 2L at start of shift due to low hemoglobin and shortness of breath. Will continue to monitor pain, anxiety labs and vitals. Patient and husband aware of the increased vital signs and ongoing monitoring for sepsis. All questions answered. Bed alarm on, call light within reach.

## 2020-11-14 NOTE — H&P (Signed)
History and Physical    Phyllis Hebert WJX:[id number] DOB: 1955-11-08 DOA: 11/14/2020  PCP: [name], MD  Patient coming from: [facility]  Chief Complaint: hematuria  HPI: Phyllis Hebert is a 65 y.o. female with medical history significant of stage 4 cervical cancer, anxiety. Presenting with hematuria. She reports her symptoms began around midnight last night. She had no pain, N/V, or fever. She noted red urine that seemed continuous. She had an event several months ago where she had significant vaginal bleeding; so when she saw her current symptoms, she became concerned and came to the ED. She denies any other aggravating or alleviating factors.   ED Course: No vaginal bleeding seen. Check urine and saw probable UTI and hematuria. MRI lumbar showing previously known bilat hydroureteronephrosis but no spinal injury. TRH was called for admission.   Review of Systems:  Denies CP, dyspnea, palpitations, N/V/D, fevers. Review of systems is otherwise negative for all not mentioned in HPI.   PMHx Past Medical History:  Diagnosis Date  . Anemia   . Anxiety   . Arthritis   . Cervical cancer, FIGO stage IVB [facility]) oncologist--- dr gorsuch/ dr Sondra Come   dx inital dx 09/ 2021, w/ mets to bone, started chemo 04-30-2020 and stopped 05-21-2020 due to worsening pancytopenia;  completed IMRT 06-16-2020 and scheduled to start brachytherapy boost high dose 06-21-2020  . Chemotherapy-induced nausea   . Cutaneous lupus erythematosus    followed by [facility]-- Marella Chimes PA  . Dyslipidemia   . Family history of breast cancer   . Family history of uterine cancer   . GERD (gastroesophageal reflux disease)   . Hemorrhoids   . History of 2019 novel coronavirus disease (COVID-19) 06/20/2019   [facility] , had mild symptoms that resolved  . History of colon polyps 04/06/2020   hyerplastic  . History of external beam radiation therapy 05-03-2020  to 06-16-2020   cervical cancer  .  History of gastric polyp 04/06/2020   hyperplastic  . History of gastric ulcer   . History of radiation therapy 05/03/20-06/16/20 and 06/21/20-07/19/20   Cervical IMRT and Cervical HDR Tandem & Ring: Dr Gery Pray  . Pancytopenia due to chemotherapy ([place])   . Wears glasses     PSHx Past Surgical History:  Procedure Laterality Date  . CATARACT EXTRACTION W/ INTRAOCULAR LENS IMPLANT Left 12/2019  . COLONOSCOPY WITH ESOPHAGOGASTRODUODENOSCOPY (EGD)  04-06-2020  dr Raliegh Ip. [place]  . CYSTOSCOPY WITH STENT PLACEMENT Bilateral 10/10/2020   Procedure: CYSTOSCOPY WITH STENT PLACEMENT;  Surgeon: Lucas Mallow, MD;  Location: [facility];  Service: Urology;  Laterality: Bilateral;  . ENDOMETRIAL ABLATION W/ NOVASURE  2010  . IR IMAGING GUIDED PORT INSERTION  04/26/2020  . OPERATIVE ULTRASOUND N/A 06/21/2020   Procedure: OPERATIVE ULTRASOUND;  Surgeon: Gery Pray, MD;  Location[facility];  Service: Urology;  Laterality: N/A;  TECH  . OPERATIVE ULTRASOUND N/A 07/01/2020   Procedure: OPERATIVE ULTRASOUND;  Surgeon: Gery Pray, MD;  Location: [facility];  Service: Urology;  Laterality: N/A;  TECH  . OPERATIVE ULTRASOUND N/A 07/06/2020   Procedure: OPERATIVE ULTRASOUND;  Surgeon: Gery Pray, MD;  Location: [facility];  Service: Urology;  Laterality: N/A;  NEED TECH AND ULTRASOUND TO ARRIVE AT 8AM  . OPERATIVE ULTRASOUND N/A 07/14/2020   Procedure: OPERATIVE ULTRASOUND;  Surgeon: Gery Pray, MD;  Location: [facility];  Service: Urology;  Laterality: N/A;  . OPERATIVE ULTRASOUND  N/A 07/19/2020   Procedure: OPERATIVE ULTRASOUND;  Surgeon: Gery Pray, MD;  Location: [facility];  Service: Urology;  Laterality: N/A;  . TANDEM RING INSERTION N/A 06/21/2020   Procedure: TANDEM RING INSERTION;  Surgeon: Gery Pray, MD;  Location: [facility];  Service: Urology;  Laterality: N/A;  . TANDEM RING INSERTION N/A  07/01/2020   Procedure: TANDEM RING INSERTION;  Surgeon: Gery Pray, MD;  Location: [facility];  Service: Urology;  Laterality: N/A;  . TANDEM RING INSERTION N/A 07/06/2020   Procedure: TANDEM RING INSERTION;  Surgeon: Gery Pray, MD;  Location: [facility];  Service: Urology;  Laterality: N/A;  . TANDEM RING INSERTION N/A 07/14/2020   Procedure: TANDEM RING INSERTION;  Surgeon: Gery Pray, MD;  Location: [facility];  Service: Urology;  Laterality: N/A;  . TANDEM RING INSERTION N/A 07/19/2020   Procedure: TANDEM RING INSERTION;  Surgeon: Gery Pray, MD;  Location: [facility];  Service: Urology;  Laterality: N/A;    SocHx  reports that she has never smoked. She has never used smokeless tobacco. She reports that she does not drink alcohol and does not use drugs.  Allergies  Allergen Reactions  . Penicillins Rash    FamHx Family History  Problem Relation Age of Onset  . Hypertension Mother   . Emphysema Mother   . Uterine cancer Mother 84  . Hypertension Father   . Stroke Father   . Hypertension Brother   . Healthy Daughter   . Breast cancer Maternal Aunt        d. 61  . Leukemia Cousin        d. 3s    Prior to Admission medications   Medication Sig Start Date End Date Taking? Authorizing Provider  HYDROmorphone (DILAUDID) 8 MG tablet Take 1 tablet (8 mg total) by mouth every 4 (four) hours as needed for severe pain. 11/09/20  Yes Gorsuch, Ni, MD  LORazepam (ATIVAN) 0.5 MG tablet Take 1 tablet (0.5 mg total) by mouth every 8 (eight) hours as needed for anxiety. 11/09/20  Yes Gorsuch, Ni, MD  morphine (MS CONTIN) 60 MG 12 hr tablet Take 1 tablet (60 mg total) by mouth in the morning, at noon, and at bedtime. TAKE 1 TABLET BY MOUTH 3 TIMES DAILY 11/09/20  Yes Gorsuch, Ni, MD  pantoprazole (PROTONIX) 40 MG tablet Take 1 tablet (40 mg total) by mouth 2 (two) times daily. 04/06/20  Yes Thornton Park, MD   polyethylene glycol (MIRALAX / GLYCOLAX) 17 g packet Dissolve 17 g in water or juice and drink once daily. 10/15/20  Yes Mariel Aloe, MD  predniSONE (DELTASONE) 20 MG tablet Take 2 tablets (40 mg total) by mouth daily with breakfast. 10/04/20  Yes Gorsuch, Ni, MD  prochlorperazine (COMPAZINE) 10 MG tablet TAKE 1 TABLET BY MOUTH EVERY 6 HOURS AS NEEDED FOR NAUSEA AND/OR VOMITING Patient taking differently: Take 10 mg by mouth every 6 (six) hours as needed. 10/04/20 10/04/21 Yes Gorsuch, Ni, MD  senna-docusate (SENOKOT-S) 8.6-50 MG tablet Take 2 tablets by mouth 2 (two) times daily. 10/14/20  Yes Mariel Aloe, MD    Physical Exam: Vitals:   11/14/20 0630 11/14/20 0730 11/14/20 0940 11/14/20 1100  BP: 116/84 (!) 148/97 (!) 128/98 (!) 141/96  Pulse: (!) 103 (!) 107 (!) 110 (!) 116  Resp: (!) 23 17 16 15   Temp:      TempSrc:      SpO2: 99% 100% 100%  97%    General: 65 y.o. ill appearing female resting in bed in NAD Eyes: PERRL, normal sclera ENMT: Nares patent w/o discharge, orophaynx clear, dentition normal, ears w/o discharge/lesions/ulcers Neck: Supple, trachea midline Cardiovascular: RRR, +S1, S2, no m/g/r, equal pulses throughout Respiratory: CTABL, no w/r/r, normal WOB GI: BS+, NDNT, no masses noted, no organomegaly noted MSK: No e/c/c Skin: No rashes, bruises, ulcerations noted Neuro: A&O x 3, no focal deficits Psyc: Appropriate interaction and affect, calm/cooperative  Labs on Admission: I have personally reviewed following labs and imaging studies  CBC: Recent Labs  Lab 11/14/20 0246  WBC 9.4  NEUTROABS 8.2*  HGB 8.2*  HCT 26.1*  MCV 90.0  PLT 95*   Basic Metabolic Panel: Recent Labs  Lab 11/14/20 0246  NA 138  K 3.6  CL 101  CO2 25  GLUCOSE 116*  BUN 23  CREATININE 1.09*  CALCIUM 10.1   GFR: CrCl cannot be calculated (Unknown ideal weight.). Liver Function Tests: No results for input(s): AST, ALT, ALKPHOS, BILITOT, PROT, ALBUMIN in the last 168  hours. No results for input(s): LIPASE, AMYLASE in the last 168 hours. No results for input(s): AMMONIA in the last 168 hours. Coagulation Profile: No results for input(s): INR, PROTIME in the last 168 hours. Cardiac Enzymes: No results for input(s): CKTOTAL, CKMB, CKMBINDEX, TROPONINI in the last 168 hours. BNP (last 3 results) No results for input(s): PROBNP in the last 8760 hours. HbA1C: No results for input(s): HGBA1C in the last 72 hours. CBG: No results for input(s): GLUCAP in the last 168 hours. Lipid Profile: No results for input(s): CHOL, HDL, LDLCALC, TRIG, CHOLHDL, LDLDIRECT in the last 72 hours. Thyroid Function Tests: No results for input(s): TSH, T4TOTAL, FREET4, T3FREE, THYROIDAB in the last 72 hours. Anemia Panel: No results for input(s): VITAMINB12, FOLATE, FERRITIN, TIBC, IRON, RETICCTPCT in the last 72 hours. Urine analysis:    Component Value Date/Time   COLORURINE YELLOW 11/14/2020 0319   APPEARANCEUR CLOUDY (A) 11/14/2020 0319   LABSPEC 1.011 11/14/2020 0319   PHURINE 9.0 (H) 11/14/2020 0319   GLUCOSEU NEGATIVE 11/14/2020 0319   HGBUR LARGE (A) 11/14/2020 0319   BILIRUBINUR NEGATIVE 11/14/2020 0319   KETONESUR NEGATIVE 11/14/2020 0319   PROTEINUR 100 (A) 11/14/2020 0319   NITRITE NEGATIVE 11/14/2020 0319   LEUKOCYTESUR MODERATE (A) 11/14/2020 0319    Radiological Exams on Admission: CT ABDOMEN PELVIS WO CONTRAST  Result Date: 11/14/2020 CLINICAL DATA:  Hematuria with unknown cause. History of cervical cancer with metastatic disease, on chemotherapy. EXAM: CT ABDOMEN AND PELVIS WITHOUT CONTRAST TECHNIQUE: Multidetector CT imaging of the abdomen and pelvis was performed following the standard protocol without IV contrast. COMPARISON:  07/27/2020 PET CT FINDINGS: Lower chest:  No contributory findings. Hepatobiliary: Subcapsular cyst on the central ventral liver.No evidence of biliary obstruction or stone. Pancreas: Unremarkable. Spleen: Unremarkable.  Left  upper quadrant splenules. Adrenals/Urinary Tract: Negative adrenals. Bilateral hydroureteronephrosis, new on the right since prior. There is left renal cortical thinning. A right internal ureteral stent is in good position. Collapsed bladder. Stomach/Bowel:  No obstruction. No visible bowel inflammation. Vascular/Lymphatic: No acute vascular abnormality. No mass or adenopathy. Reproductive:Low-density with trapped gas at the level of the treated cervix, a change from prior. The abnormal area measures 6 x 3 x 3 cm. Symmetric ovaries with small low-density cysts. Low-density distension of the upper endometrial cavity similar to comparison PET, possibly trapped fluid. Other: No ascites or pneumoperitoneum. Musculoskeletal: No acute abnormalities. IMPRESSION: 1. Right hydroureteronephrosis despite a well-positioned internal  ureteral stent. More chronic left hydroureteronephrosis with cortical thinning that is similar to February 2022. 2. Interval necrotic changes at the treated cervix with debris like appearance spanning a 6 x 3 x 3 cm area. Electronically Signed   By: Monte Fantasia M.D.   On: 11/14/2020 08:25   MR LUMBAR SPINE WO CONTRAST  Result Date: 11/14/2020 CLINICAL DATA:  Low back pain with cauda equina syndrome suspected EXAM: MRI LUMBAR SPINE WITHOUT CONTRAST TECHNIQUE: Multiplanar, multisequence MR imaging of the lumbar spine was performed. No intravenous contrast was administered. COMPARISON:  None. FINDINGS: Segmentation:  Rudimentary disc space at the level numbered S1-2. Alignment:  Slight retrolisthesis at L5-S1, chronic Vertebrae: No fracture, evidence of discitis, or bone lesion. Accentuated fatty marrow at L5 and below, attributed to history of radiotherapy. Conus medullaris and cauda equina: Conus extends to the L1-2 level. Conus and cauda equina appear normal. Paraspinal and other soft tissues: Bilateral hydroureteronephrosis which is known from prior abdominal CT. Saccular hypointense  structure at the upper right renal hilum measuring 13 mm. Disc levels: Focal advanced disc narrowing with mild bulging and ridging at L5-S1. There is degenerative facet spurring at L3-4 and below. Mild disc bulging at L3-4 and L4-5. No neural impingement. IMPRESSION: 1. No acute finding.  No evidence of malignancy in the spine. 2. Bilateral hydroureteronephrosis as described on preceding CT. 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 3. Lower lumbar degeneration without inflammation or impingement. Electronically Signed   By: Monte Fantasia M.D.   On: 11/14/2020 09:31    EKG: None obtained in ED  Assessment/Plan Hematuria UTI     - admit to inpt, progressive     - continue rocephin     - follow q8h H&H; transfuse for Hgb < 7     - she has just signed on with outpt palliative care, but is agreeing to transfusions, abx; lets see if she evens out with these interventions, we can discuss something more aggressive if she does not respond/stabilize     - fluids  S4 Cervical Cancer     - no further interventions with onco; patient has made the decision to go with outpt hospice     - palliative consult placed by ED  Cutaneous lupus     - continue home regimen  Chronic pain     - continue home regimen  DVT prophylaxis: SCDs  Code Status: DNR  Family Communication: w/ husband at bedside  Consults called: Palliative Care   Status is: Inpatient  Remains inpatient appropriate because:Inpatient level of care appropriate due to severity of illness   Dispo: The patient is from: Home              Anticipated d/c is to: Home              Patient currently is not medically stable to d/c.   Difficult to place patient No  Time spent coordinating admission: 70 minutes  [facility]  If 7PM-7AM, please contact night-coverage [email or web address]  11/14/2020, 11:29 AM

## 2020-11-14 NOTE — ED Provider Notes (Signed)
[facility] DEPT Provider Note   CSN: [id number] Arrival date & time: 11/14/20  0135     History Chief Complaint  Patient presents with  . Vaginal Bleeding    Phyllis Hebert is a 65 y.o. female presents to the Emergency Department with concerns of acute vaginal bleeding onset just prior to arrival.  Patient has stage IV cervical cancer for which she has undergone numerous rounds of radiation and chemotherapy.  She recently stopped all treatments and transitioned into hospice.  She reports that in early April she had a severe vaginal bleed which required vaginal packing and transfusion of 7 units of blood.  Since that time she is had persistent brown discharge from her vagina but no bleeding.  Tonight she reports that she noticed some blood in the toilet while urinating.  She reports concern about severe vaginal bleeding again and came immediately here to the emergency department.  She reports she can feel it dripping between her legs but has not evaluated this any further.  Patient reports that walking creates severe pain in her vagina and abdomen.  Records reviewed.  Patient is taking multiple doses of Dilaudid and morphine for pain control.  She has a history of anemia with a hemoglobin between 8 and 9.  The history is provided by the patient and medical records. No language interpreter was used.       Past Medical History:  Diagnosis Date  . Anemia   . Anxiety   . Arthritis   . Cervical cancer, FIGO stage IVB [facility]) oncologist--- dr gorsuch/ dr Sondra Come   dx inital dx 09/ 2021, w/ mets to bone, started chemo 04-30-2020 and stopped 05-21-2020 due to worsening pancytopenia;  completed IMRT 06-16-2020 and scheduled to start brachytherapy boost high dose 06-21-2020  . Chemotherapy-induced nausea   . Cutaneous lupus erythematosus    followed by [facility]-- Marella Chimes PA  . Dyslipidemia   . Family history of breast cancer   . Family history of uterine cancer    . GERD (gastroesophageal reflux disease)   . Hemorrhoids   . History of 2019 novel coronavirus disease (COVID-19) 06/20/2019   [facility] , had mild symptoms that resolved  . History of colon polyps 04/06/2020   hyerplastic  . History of external beam radiation therapy 05-03-2020  to 06-16-2020   cervical cancer  . History of gastric polyp 04/06/2020   hyperplastic  . History of gastric ulcer   . History of radiation therapy 05/03/20-06/16/20 and 06/21/20-07/19/20   Cervical IMRT and Cervical HDR [facility]: Dr Gery Pray  . Pancytopenia due to chemotherapy ([place])   . Wears glasses     Patient Active Problem List   Diagnosis Date Noted  . Other constipation 11/09/2020  . Cancer associated pain 10/18/2020  . Physical debility 10/14/2020  . Hydronephrosis 10/14/2020  . Vaginal bleeding 10/08/2020  . Hypokalemia 10/04/2020  . Deficiency anemia 10/04/2020  . Bilateral hydronephrosis 10/04/2020  . Drug-induced hyperglycemia 09/07/2020  . Hypomagnesemia 08/24/2020  . GERD (gastroesophageal reflux disease)   . Acquired hypothyroidism 07/13/2020  . Genetic testing 06/21/2020  . Family history of breast cancer   . Family history of uterine cancer   . Pancytopenia, acquired ([place]) 05/26/2020  . Nausea without vomiting 05/20/2020  . Pain in joint, pelvic region and thigh 05/12/2020  . Anxiety disorder due to medical condition 05/05/2020  . Essential hypertension 05/05/2020  . Family history of cancer 04/29/2020  . Goals  of care, counseling/discussion 04/29/2020  . SLE (systemic lupus erythematosus) ([place]) 04/20/2020  . Hydronephrosis of left kidney 04/20/2020  . Preventive measure 04/20/2020  . Metastasis to bone ([place]) 04/19/2020  . Malignant neoplasm of cervix ([place]) 04/01/2020  . COVID-19 virus infection 06/26/2019  . Obesity (BMI 30-39.9) 08/07/2018  . Hypertriglyceridemia 07/02/2017  . Vitamin D deficiency 07/02/2017  . Physical exam 07/02/2017  .  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 CATARACT EXTRACTION W/ INTRAOCULAR LENS IMPLANT Left 12/2019  . COLONOSCOPY WITH ESOPHAGOGASTRODUODENOSCOPY (EGD)  04-06-2020  dr Raliegh Ip. beavers  . CYSTOSCOPY WITH STENT PLACEMENT Bilateral 10/10/2020   Procedure: CYSTOSCOPY WITH STENT PLACEMENT;  Surgeon: Lucas Mallow, MD;  Location: [facility];  Service: Urology;  Laterality: Bilateral;  . ENDOMETRIAL ABLATION W/ NOVASURE  2010  . IR IMAGING GUIDED PORT INSERTION  04/26/2020  . OPERATIVE ULTRASOUND N/A 06/21/2020   Procedure: OPERATIVE ULTRASOUND;  Surgeon: Gery Pray, MD;  Location: [facility];  Service: Urology;  Laterality: N/A;  TECH  . OPERATIVE ULTRASOUND N/A 07/01/2020   Procedure: OPERATIVE ULTRASOUND;  Surgeon: Gery Pray, MD;  Location: [facility];  Service: Urology;  Laterality: N/A;  TECH  . OPERATIVE ULTRASOUND N/A 07/06/2020   Procedure: OPERATIVE ULTRASOUND;  Surgeon: Gery Pray, MD;  Location: [facility];  Service: Urology;  Laterality: N/A;  NEED TECH AND ULTRASOUND TO ARRIVE AT 8AM  . OPERATIVE ULTRASOUND N/A 07/14/2020   Procedure: OPERATIVE ULTRASOUND;  Surgeon: Gery Pray, MD;  Location: [facility];  Service: Urology;  Laterality: N/A;  . OPERATIVE ULTRASOUND N/A 07/19/2020   Procedure: OPERATIVE ULTRASOUND;  Surgeon: Gery Pray, MD;  Location: [facility];  Service: Urology;  Laterality: N/A;  . TANDEM RING INSERTION N/A 06/21/2020   Procedure: TANDEM RING INSERTION;  Surgeon: Gery Pray, MD;  Location: [facility];  Service: Urology;  Laterality: N/A;  . TANDEM RING INSERTION N/A 07/01/2020   Procedure: TANDEM RING INSERTION;  Surgeon: Gery Pray, MD;  Location: [facility];  Service: Urology;  Laterality: N/A;  . TANDEM RING INSERTION N/A 07/06/2020   Procedure: TANDEM RING INSERTION;  Surgeon: Gery Pray, MD;  Location: [facility];  Service: Urology;  Laterality: N/A;  . TANDEM RING INSERTION N/A 07/14/2020   Procedure: TANDEM RING INSERTION;  Surgeon: Gery Pray, MD;  Location: [facility];  Service: Urology;  Laterality: N/A;  . TANDEM RING INSERTION N/A 07/19/2020   Procedure: TANDEM RING INSERTION;  Surgeon: Gery Pray, MD;  Location: [facility];  Service: Urology;  Laterality: N/A;     OB History   No obstetric history on file.     Family History  Problem Relation Age of Onset  . Hypertension Mother   . Emphysema Mother   . Uterine cancer Mother 59  . Hypertension Father   . Stroke Father   . Hypertension Brother   . Healthy Daughter   . Breast cancer Maternal Aunt        d. 37  . Leukemia Cousin        d. 68s    Social History   Tobacco Use  . Smoking status: Never Smoker  . Smokeless tobacco: Never Used  Vaping Use  . Vaping Use: Never used  Substance Use Topics  . Alcohol use: No  . Drug use: Never    Home Medications Prior to Admission medications  Medication Sig Start Date End Date Taking? Authorizing Provider  HYDROmorphone (DILAUDID) 8 MG tablet Take 1 tablet (8 mg total) by mouth every 4 (four) hours as needed for severe pain. 11/09/20  Yes Gorsuch, Ni, MD  LORazepam (ATIVAN) 0.5 MG tablet Take 1 tablet (0.5 mg total) by mouth every 8 (eight) hours as needed for anxiety. 11/09/20  Yes Gorsuch, Ni, MD  morphine (MS CONTIN) 60 MG 12 hr tablet Take 1 tablet (60 mg total) by mouth in the morning, at noon, and at bedtime. TAKE 1 TABLET BY MOUTH 3 TIMES DAILY 11/09/20  Yes Gorsuch, Ni, MD  pantoprazole (PROTONIX) 40 MG tablet Take 1 tablet (40 mg total) by mouth 2 (two) times daily. 04/06/20  Yes [place], MD  polyethylene glycol (MIRALAX / GLYCOLAX) 17 g packet Dissolve 17 g in water or juice and drink once daily. 10/15/20  Yes Mariel Aloe, MD  predniSONE (DELTASONE) 20 MG tablet Take 2 tablets  (40 mg total) by mouth daily with breakfast. 10/04/20  Yes Gorsuch, Ni, MD  prochlorperazine (COMPAZINE) 10 MG tablet TAKE 1 TABLET BY MOUTH EVERY 6 HOURS AS NEEDED FOR NAUSEA AND/OR VOMITING Patient taking differently: Take 10 mg by mouth every 6 (six) hours as needed. 10/04/20 10/04/21 Yes Gorsuch, Ni, MD  senna-docusate (SENOKOT-S) 8.6-50 MG tablet Take 2 tablets by mouth 2 (two) times daily. 10/14/20  Yes Mariel Aloe, MD    Allergies    Penicillins  Review of Systems   Review of Systems  Constitutional: Negative for appetite change, diaphoresis, fatigue, fever and unexpected weight change.  HENT: Negative for mouth sores.   Eyes: Negative for visual disturbance.  Respiratory: Negative for cough, chest tightness, shortness of breath and wheezing.   Cardiovascular: Negative for chest pain.  Gastrointestinal: Positive for abdominal pain. Negative for constipation, diarrhea, nausea and vomiting.  Endocrine: Negative for polydipsia, polyphagia and polyuria.  Genitourinary: Positive for vaginal bleeding. Negative for dysuria, frequency, hematuria and urgency.  Musculoskeletal: Positive for back pain. Negative for neck stiffness.  Skin: Negative for rash.  Allergic/Immunologic: Negative for immunocompromised state.  Neurological: Negative for syncope, light-headedness and headaches.  Hematological: Does not bruise/bleed easily.  Psychiatric/Behavioral: Negative for sleep disturbance. The patient is not nervous/anxious.     Physical Exam Updated Vital Signs BP (!) 165/95   Pulse (!) 131   Temp 99 F (37.2 C) (Oral)   Resp 13   SpO2 100%   Physical Exam Vitals and nursing note reviewed. Exam conducted with a chaperone present.  Constitutional:      General: She is not in acute distress.    Appearance: She is not diaphoretic.  HENT:     Head: Normocephalic.  Eyes:     General: No scleral icterus.    Conjunctiva/sclera: Conjunctivae normal.  Cardiovascular:     Rate and  Rhythm: Normal rate and regular rhythm.     Pulses: Normal pulses.          Radial pulses are 2+ on the right side and 2+ on the left side.  Pulmonary:     Effort: No tachypnea, accessory muscle usage, prolonged expiration, respiratory distress or retractions.     Breath sounds: No stridor.     Comments: Equal chest rise. No increased work of breathing. Abdominal:     General: There is no distension.     Palpations: Abdomen is soft.     Tenderness: There is no abdominal tenderness. There is no guarding or rebound.  Genitourinary:  General: Normal vulva.     Exam position: Supine.     Comments: Patient with persistent urination but no obvious vaginal bleeding on external exam. Musculoskeletal:     Cervical back: Normal range of motion.     Comments: Moves all extremities equally and without difficulty.  Skin:    General: Skin is warm and dry.     Capillary Refill: Capillary refill takes less than 2 seconds.  Neurological:     Mental Status: She is alert.     GCS: GCS eye subscore is 4. GCS verbal subscore is 5. GCS motor subscore is 6.     Comments: Speech is clear and goal oriented.  Psychiatric:        Mood and Affect: Mood normal.     ED Results / Procedures / Treatments   Labs (all labs ordered are listed, but only abnormal results are displayed) Labs Reviewed  CBC WITH DIFFERENTIAL/PLATELET - Abnormal; Notable for the following components:      Result Value   RBC 2.90 (*)    Hemoglobin 8.2 (*)    HCT 26.1 (*)    RDW 18.6 (*)    Platelets 95 (*)    nRBC 0.4 (*)    Neutro Abs 8.2 (*)    Abs Immature Granulocytes 0.15 (*)    All other components within normal limits  BASIC METABOLIC PANEL - Abnormal; Notable for the following components:   Glucose, Bld 116 (*)    Creatinine, Ser 1.09 (*)    GFR, Estimated 57 (*)    All other components within normal limits  URINALYSIS, ROUTINE W REFLEX MICROSCOPIC - Abnormal; Notable for the following components:   APPearance  CLOUDY (*)    pH 9.0 (*)    Hgb urine dipstick LARGE (*)    Protein, ur 100 (*)    Leukocytes,Ua MODERATE (*)    RBC / HPF >50 (*)    All other components within normal limits  URINE CULTURE  SARS CORONAVIRUS 2 (TAT 6-24 HRS)  LACTIC ACID, PLASMA  LACTIC ACID, PLASMA  TYPE AND SCREEN    Procedures Procedures   Medications Ordered in ED Medications  morphine (MS CONTIN) 12 hr tablet 60 mg (60 mg Oral Given 11/14/20 0540)  HYDROmorphone (DILAUDID) tablet 8 mg (has no administration in time range)  HYDROmorphone (DILAUDID) injection 1 mg (1 mg Intravenous Given 11/14/20 0311)  cefTRIAXone (ROCEPHIN) 1 g in sodium chloride 0.9 % 100 mL IVPB (0 g Intravenous Stopped 11/14/20 0522)  HYDROmorphone (DILAUDID) injection 1 mg (1 mg Intravenous Given 11/14/20 0531)  LORazepam (ATIVAN) injection 1 mg (1 mg Intravenous Given 11/14/20 0534)    ED Course  I have reviewed the triage vital signs and the nursing notes.  Pertinent labs & imaging results that were available during my care of the patient were reviewed by me and considered in my medical decision making (see chart for details).  Clinical Course as of 11/14/20 0704  Sun Nov 14, 2020  0500 Hemoglobin(!): 8.2 Down from 9.5 three weeks ago [HM]  0500 Pulse Rate(!): 131 Pt with persistent tachycardia, but also rating her pain as severe - 10/10 [HM]    Clinical Course User Index [HM] Cyenna Rebello, Gwenlyn Perking   MDM Rules/Calculators/A&P                           Patient with end-stage cervical cancer presents with abdominal pain and concern for vaginal bleeding.  On exam no  evidence of vaginal bleeding but hematuria is noted.  Likely UTI.  Patient persistently tachycardic and with difficult to control pain.  Rocephin given.  Patient also with urinary incontinence.  Given new metastasis to her spine and worsening back pain some concern for cauda equina.  MRI pending.  Previous CT scans showed tumor compression of the bladder.    Discussed with hospitalist Dr. Marlowe Sax about admission.  7:04 AM At shift change care was transferred to [facility] who will follow pending studies, re-evaulate and determine disposition.  Anticipate admission.   Final Clinical Impression(s) / ED Diagnoses Final diagnoses:  Urinary tract infection without hematuria, site unspecified  Urinary incontinence, unspecified type    Rx / DC Orders ED Discharge Orders    None       Michelena Culmer, Gwenlyn Perking 11/14/20 [place], [place], DO 11/14/20 [phone]

## 2020-11-14 NOTE — ED Notes (Signed)
Pt. Still in MRI will provide breakfast tray and update vitals when she returns.

## 2020-11-14 NOTE — ED Notes (Addendum)
Patient transported to MRI 

## 2020-11-14 NOTE — Progress Notes (Signed)
   This pt is a current and active pt with [facility]. Admitted to hospice services on 11/11/20 for Malignant  Neoplasm of cervix and mets to bone. (r ischial bone and small scattered pulmonary nodules.)  She has had bilateral hydronephrosis in past as well with ureteral stent placement from increased size of malignancy extending into the vagina and throughout urterine body.  We were not notified by family that pt was sent to ED.   Pt had nursing visit yesterday morning in home with hospice services and she ambulates with rollator and gait si slow but steady. She does report SOB with ambulation and increased pain with walking as well. Her pain yesterday morning was reported to be 2/10. She has narcotics in home of hydromorphone and MS Contin for pain management.   This is a related hospital admission.   Please reach out with any concerns or questions. Thank you for your assistance in helping are for our pt. As we continue to work with her and family on progression of disease and ongoing sx management needs.  Webb Silversmith RN [phone]

## 2020-11-14 NOTE — ED Notes (Signed)
Breakfast tray provided. 

## 2020-11-14 NOTE — ED Provider Notes (Signed)
  Physical Exam  BP (!) 128/98   Pulse (!) 110   Temp 99 F (37.2 C) (Oral)   Resp 16   SpO2 100%   Physical Exam  ED Course/Procedures   Clinical Course as of 11/14/20 0941  Sun Nov 14, 2020  0500 Hemoglobin(!): 8.2 Down from 9.5 three weeks ago [HM]  0500 Pulse Rate(!): 131 Pt with persistent tachycardia, but also rating her pain as severe - 10/10 [HM]    Clinical Course User Index [HM] Muthersbaugh, Jarrett Soho, PA-C    Procedures  MDM   Patient signed out to me by H Muthersbaugh, PA-C.  Please see previous notes for further history.  In brief, patient diagnosed with cervical cancer in October.  At the time of diagnosis, she was stage IV with metastatic disease.  She was undergoing chemo and radiation until 2 weeks ago when patient and oncology team decided to stop treatment.  Focusing on pain management.  In April, patient had massive vaginal bleeding requiring 7 units of blood.  Last night, she noticed bleeding, and was extremely concerned and came to the ED. on exam, patient without vaginal bleeding, but she will is fully incontinent with hematuria.  She was started on treatment for UTI.  Plan for admission.  Overnight hospitalist team requesting MRI to rule out cauda equina and CT abdomen pelvis prior to admission.  MRI without cauda equina.  CT abdomen pelvis shows hydronephrosis, but no other acute or concerning emergent abnormalities.  Will plan for admission. Palliative care consult placed.   Discussed with Dr. Marylyn Ishihara from [facility] hospital[facility] service, pt to be admitted.      Franchot Heidelberg, PA-C 11/14/20 1027    Malvin Johns, MD 11/14/20 1413

## 2020-11-14 NOTE — ED Notes (Signed)
Pt and MRI tech requested IV meds for severe pain prior to transport to MRI. Phyllis Hebert, Phyllis Hebert notified

## 2020-11-15 DIAGNOSIS — Z7189 Other specified counseling: Secondary | ICD-10-CM

## 2020-11-15 DIAGNOSIS — R531 Weakness: Secondary | ICD-10-CM

## 2020-11-15 DIAGNOSIS — R319 Hematuria, unspecified: Secondary | ICD-10-CM | POA: Diagnosis not present

## 2020-11-15 DIAGNOSIS — Z515 Encounter for palliative care: Secondary | ICD-10-CM | POA: Diagnosis not present

## 2020-11-15 LAB — CBC
HCT: 25.2 % — ABNORMAL LOW (ref 36.0–46.0)
Hemoglobin: 7.7 g/dL — ABNORMAL LOW (ref 12.0–15.0)
MCH: 28.3 pg (ref 26.0–34.0)
MCHC: 30.6 g/dL (ref 30.0–36.0)
MCV: 92.6 fL (ref 80.0–100.0)
Platelets: 67 10*3/uL — ABNORMAL LOW (ref 150–400)
RBC: 2.72 MIL/uL — ABNORMAL LOW (ref 3.87–5.11)
RDW: 19.4 % — ABNORMAL HIGH (ref 11.5–15.5)
WBC: 8 10*3/uL (ref 4.0–10.5)
nRBC: 0 % (ref 0.0–0.2)

## 2020-11-15 LAB — COMPREHENSIVE METABOLIC PANEL
ALT: 12 U/L (ref 0–44)
AST: 9 U/L — ABNORMAL LOW (ref 15–41)
Albumin: 2.9 g/dL — ABNORMAL LOW (ref 3.5–5.0)
Alkaline Phosphatase: 52 U/L (ref 38–126)
Anion gap: 7 (ref 5–15)
BUN: 17 mg/dL (ref 8–23)
CO2: 25 mmol/L (ref 22–32)
Calcium: 8.9 mg/dL (ref 8.9–10.3)
Chloride: 105 mmol/L (ref 98–111)
Creatinine, Ser: 1.07 mg/dL — ABNORMAL HIGH (ref 0.44–1.00)
GFR, Estimated: 58 mL/min — ABNORMAL LOW (ref >60)
Glucose, Bld: 129 mg/dL — ABNORMAL HIGH (ref 70–99)
Potassium: 3.3 mmol/L — ABNORMAL LOW (ref 3.5–5.1)
Sodium: 137 mmol/L (ref 135–145)
Total Bilirubin: 0.7 mg/dL (ref 0.3–1.2)
Total Protein: 6.1 g/dL — ABNORMAL LOW (ref 6.5–8.1)

## 2020-11-15 LAB — URINE CULTURE: Culture: <10000 — AB

## 2020-11-15 MED ORDER — LORAZEPAM 2 MG/ML IJ SOLN
0.5000 mg | Freq: Once | INTRAMUSCULAR | Status: AC
  Administered 2020-11-15: 0.5 mg via INTRAVENOUS
  Filled 2020-11-15: qty 1

## 2020-11-15 MED ORDER — HYDROMORPHONE HCL 1 MG/ML IJ SOLN
0.5000 mg | INTRAMUSCULAR | Status: DC | PRN
  Administered 2020-11-16: 1 mg via INTRAVENOUS
  Filled 2020-11-15: qty 1

## 2020-11-15 NOTE — Plan of Care (Signed)
Plan of care discussed with pt.

## 2020-11-15 NOTE — Hospital Course (Signed)
HPI on admission: "Phyllis Hebert is a 65 y.o. female with medical history significant of stage 4 cervical cancer, anxiety. Presenting with hematuria. She reports her symptoms began around midnight last night. She had no pain, N/V, or fever. She noted red urine that seemed continuous. She had an event several months ago where she had significant vaginal bleeding; so when she saw her current symptoms, she became concerned and came to the ED. She denies any other aggravating or alleviating factors.    ED Course: No vaginal bleeding seen. Check urine and saw probable UTI and hematuria. MRI lumbar showing previously known bilat hydroureteronephrosis but no spinal injury."  Admitted to [facility] service and started on empiric Rocephin for UTI.  Following [facility] trend.   Palliative care consulted.

## 2020-11-15 NOTE — Progress Notes (Signed)
PROGRESS NOTE    Phyllis Hebert   VHQ:[id number]  DOB: 1955-11-13  PCP: [name], MD    DOA: 11/14/2020 LOS: 1   Brief Narrative   HPI on admission: "Phyllis Hebert is a 65 y.o. female with medical history significant of stage 4 cervical cancer, anxiety. Presenting with hematuria. She reports her symptoms began around midnight last night. She had no pain, N/V, or fever. She noted red urine that seemed continuous. She had an event several months ago where she had significant vaginal bleeding; so when she saw her current symptoms, she became concerned and came to the ED. She denies any other aggravating or alleviating factors.    ED Course: No vaginal bleeding seen. Check urine and saw probable UTI and hematuria. MRI lumbar showing previously known bilat hydroureteronephrosis but no spinal injury."  Admitted to [facility] service and started on empiric Rocephin for UTI.  Following Hbg trend.   Palliative care consulted.    Assessment & Plan   Active Problems:   Hematuria   Acute cystitis with hematuria Continue empiric Rocephin Follow up urine culture Follow q8h H&H; transfuse for Hgb < 7 Continue IV hydration  Pt had just recently signed on with outpt palliative care, but is agreeing to transfusions, abx;   Stage 4 Cervical Cancer No further interventions planned with oncology Patient has made the decision to go with outpt hospice Palliative consulted. 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  Patient BMI: There is no height or weight on file to calculate BMI.   DVT prophylaxis: SCDs Start: 11/14/20 1413   Diet:  Diet Orders (From admission, onward)    Start     Ordered   11/14/20 1413  Diet regular Room service appropriate? Yes; Fluid consistency: Thin  Diet effective now       Question Answer  Comment  Room service appropriate? Yes   Fluid consistency: Thin      11/14/20 1412            Code Status: DNR    Subjective 11/15/20    Pt seen with husband at bedside. Pt was sleeping but woke easily.  Reports uncontrolled pain and appears quite uncomfortable.  No fever/chills.   Disposition Plan & Communication   Status is: Inpatient  Inpatient appropriate due to severity of illness, on IV antibiotics pending cutlure results.  Dispo: The patient is from: home              Anticipated d/c is to: home              Patient currently not medically stable for d/c.   Difficult to place patient no  Family Communication: husband at bedside on rounds   Consults, Procedures, Significant Events   Consultants:   Palliative care  Procedures:   none  Antimicrobials:  Anti-infectives (From admission, onward)   Start     Dose/Rate Route Frequency Ordered Stop   11/15/20 0600  cefTRIAXone (ROCEPHIN) 1 g in sodium chloride 0.9 % 100 mL IVPB        1 g 200 mL/hr over 30 Minutes Intravenous Every 24 hours 11/14/20 2122     11/14/20 0445  cefTRIAXone (ROCEPHIN) 1 g in sodium chloride 0.9 % 100 mL IVPB        1 g 200 mL/hr over 30 Minutes Intravenous  Once 11/14/20  4765 11/14/20 0522        Micro    Objective   Vitals:   11/14/20 2146 11/14/20 2350 11/15/20 0145 11/15/20 0602  BP: (!) 155/81 (!) 139/95 132/74 (!) 155/92  Pulse: (!) 123 (!) 129 (!) 105 (!) 126  Resp: 16 20 18 18   Temp: 99 F (37.2 C) 99.8 F (37.7 C) 99.3 F (37.4 C) (!) 100.7 F (38.2 C)  TempSrc:  Oral  Oral  SpO2: 100% 100% 100% 100%    Intake/Output Summary (Last 24 hours) at 11/15/2020 1344 Last data filed at 11/15/2020 4650 Gross per 24 hour  Intake 1830.57 ml  Output 200 ml  Net 1630.57 ml   There were no vitals filed for this visit.  Physical Exam:  General exam: awake, alert, no acute distress Respiratory system: CTAB, no wheezes, rales or rhonchi, normal respiratory  effort. Cardiovascular system: normal S1/S2, RRR, no pedal edema.   Gastrointestinal system: soft, NT, ND Central nervous system: A&O x3 no gross focal neurologic deficits, normal speech Psychiatry: normal mood, congruent affect, judgement and insight appear normal  Labs   Data Reviewed: I have personally reviewed following labs and imaging studies  CBC: Recent Labs  Lab 11/14/20 0246 11/14/20 1410 11/14/20 2128 11/15/20 0528  WBC 9.4  --   --  8.0  NEUTROABS 8.2*  --   --   --   HGB 8.2* 8.2* 7.7* 7.7*  HCT 26.1* 26.8* 24.8* 25.2*  MCV 90.0  --   --  92.6  PLT 95*  --   --  67*   Basic Metabolic Panel: Recent Labs  Lab 11/14/20 0246 11/15/20 0528  NA 138 137  K 3.6 3.3*  CL 101 105  CO2 25 25  GLUCOSE 116* 129*  BUN 23 17  CREATININE 1.09* 1.07*  CALCIUM 10.1 8.9   GFR: CrCl cannot be calculated (Unknown ideal weight.). Liver Function Tests: Recent Labs  Lab 11/15/20 0528  AST 9*  ALT 12  ALKPHOS 52  BILITOT 0.7  PROT 6.1*  ALBUMIN 2.9*   No results for input(s): LIPASE, AMYLASE in the last 168 hours. No results for input(s): AMMONIA in the last 168 hours. Coagulation Profile: No results for input(s): INR, PROTIME in the last 168 hours. Cardiac Enzymes: No results for input(s): CKTOTAL, CKMB, CKMBINDEX, TROPONINI in the last 168 hours. BNP (last 3 results) No results for input(s): PROBNP in the last 8760 hours. HbA1C: No results for input(s): HGBA1C in the last 72 hours. CBG: No results for input(s): GLUCAP in the last 168 hours. Lipid Profile: No results for input(s): CHOL, HDL, LDLCALC, TRIG, CHOLHDL, LDLDIRECT in the last 72 hours. Thyroid Function Tests: No results for input(s): TSH, T4TOTAL, FREET4, T3FREE, THYROIDAB in the last 72 hours. Anemia Panel: No results for input(s): VITAMINB12, FOLATE, FERRITIN, TIBC, IRON, RETICCTPCT in the last 72 hours. Sepsis Labs: Recent Labs  Lab 11/14/20 0515  LATICACIDVEN 1.5    Recent Results (from  the past 240 hour(s))  Urine culture     Status: Abnormal   Collection Time: 11/14/20  3:19 AM   Specimen: Urine, Clean Catch  Result Value Ref Range Status   Specimen Description   Final    URINE, CLEAN CATCH Performed at [facility], [place] [address], [place], [place] [address]    Special Requests   Final    NONE Performed at [facility], [place] [address] [place], [place] [address]    Culture (A)  Final    <  10,000 COLONIES/mL INSIGNIFICANT GROWTH Performed at [facility], [place] [address] [place], [place] [address]    Report Status 11/15/2020 FINAL  Final  SARS CORONAVIRUS 2 (TAT 6-24 HRS) Nasopharyngeal Nasopharyngeal Swab     Status: None   Collection Time: 11/14/20  5:43 AM   Specimen: Nasopharyngeal Swab  Result Value Ref Range Status   SARS Coronavirus 2 NEGATIVE NEGATIVE Final    Comment: (NOTE) SARS-CoV-2 target nucleic acids are NOT DETECTED.  The SARS-CoV-2 RNA is generally detectable in upper and lower respiratory specimens during the acute phase of infection. Negative results do not preclude SARS-CoV-2 infection, do not rule out co-infections with other pathogens, and should not be used as the sole basis for treatment or other patient management decisions. Negative results must be combined with clinical observations, patient history, and epidemiological information. The expected result is Negative.  Fact Sheet for Patients: SugarRoll.be  Fact Sheet for Healthcare Providers: https://www.woods-mathews.com/  This test is not yet approved or cleared by the [place] FDA and  has been authorized for detection and/or diagnosis of SARS-CoV-2 by FDA under an Emergency Use Authorization (EUA). This EUA will remain  in effect (meaning this test can be used) for the duration of the COVID-19 declaration under Se ction 564(b)(1) of the Act, 21 U.S.C. section 360bbb-3(b)(1), unless the  authorization is terminated or revoked sooner.  Performed at [facility], [place] [address], [place], [place] [address]       Imaging Studies   CT ABDOMEN PELVIS WO CONTRAST  Result Date: 11/14/2020 CLINICAL DATA:  Hematuria with unknown cause. History of cervical cancer with metastatic disease, on chemotherapy. EXAM: CT ABDOMEN AND PELVIS WITHOUT CONTRAST TECHNIQUE: Multidetector CT imaging of the abdomen and pelvis was performed following the standard protocol without IV contrast. COMPARISON:  07/27/2020 PET CT FINDINGS: Lower chest:  No contributory findings. Hepatobiliary: Subcapsular cyst on the central ventral liver.No evidence of biliary obstruction or stone. Pancreas: Unremarkable. Spleen: Unremarkable.  Left upper quadrant splenules. Adrenals/Urinary Tract: Negative adrenals. Bilateral hydroureteronephrosis, new on the right since prior. There is left renal cortical thinning. A right internal ureteral stent is in good position. Collapsed bladder. Stomach/Bowel:  No obstruction. No visible bowel inflammation. Vascular/Lymphatic: No acute vascular abnormality. No mass or adenopathy. Reproductive:Low-density with trapped gas at the level of the treated cervix, a change from prior. The abnormal area measures 6 x 3 x 3 cm. Symmetric ovaries with small low-density cysts. Low-density distension of the upper endometrial cavity similar to comparison PET, possibly trapped fluid. Other: No ascites or pneumoperitoneum. Musculoskeletal: No acute abnormalities. IMPRESSION: 1. Right hydroureteronephrosis despite a well-positioned internal ureteral stent. More chronic left hydroureteronephrosis with cortical thinning that is similar to February 2022. 2. Interval necrotic changes at the treated cervix with debris like appearance spanning a 6 x 3 x 3 cm area. Electronically Signed   By: Monte Fantasia M.D.   On: 11/14/2020 08:25   MR LUMBAR SPINE WO CONTRAST  Result Date: 11/14/2020 CLINICAL DATA:  Low  back pain with cauda equina syndrome suspected EXAM: MRI LUMBAR SPINE WITHOUT CONTRAST TECHNIQUE: Multiplanar, multisequence MR imaging of the lumbar spine was performed. No intravenous contrast was administered. COMPARISON:  None. FINDINGS: Segmentation:  Rudimentary disc space at the level numbered S1-2. Alignment:  Slight retrolisthesis at L5-S1, chronic Vertebrae: No fracture, evidence of discitis, or bone lesion. Accentuated fatty marrow at L5 and below, attributed to history of radiotherapy. Conus medullaris and cauda equina: Conus extends to the L1-2 level.  Conus and cauda equina appear normal. Paraspinal and other soft tissues: Bilateral hydroureteronephrosis which is known from prior abdominal CT. Saccular hypointense structure at the upper right renal hilum measuring 13 mm. Disc levels: Focal advanced disc narrowing with mild bulging and ridging at L5-S1. There is degenerative facet spurring at L3-4 and below. Mild disc bulging at L3-4 and L4-5. No neural impingement. IMPRESSION: 1. No acute finding.  No evidence of malignancy in the spine. 2. Bilateral hydroureteronephrosis as described on preceding CT. 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 3. Lower lumbar degeneration without inflammation or impingement. Electronically Signed   By: Monte Fantasia M.D.   On: 11/14/2020 09:31     Medications   Scheduled Meds: . Chlorhexidine Gluconate Cloth  6 each Topical Daily  . LORazepam  0.5 mg Intravenous Once  . morphine  60 mg Oral TID  . pantoprazole  40 mg Oral BID  . polyethylene glycol  17 g Oral Daily  . predniSONE  40 mg Oral Q breakfast  . senna-docusate  2 tablet Oral BID  . sodium chloride flush  10-40 mL Intracatheter Q12H   Continuous Infusions: . sodium chloride 100 mL/hr at 11/15/20 0840  . cefTRIAXone (ROCEPHIN)  IV 1 g (11/15/20 0605)       LOS: 1 day    Time spent: 25 minutes with > 50% spent at bedside and in coordination of  care.    Ezekiel Slocumb, DO [facility]  11/15/2020, 1:44 PM      If 7PM-7AM, please contact night-coverage. How to contact the [facility] Attending or Consulting provider [place] or covering provider during after hours [place], for this patient?    1. Check the care team in [facility] and look for a) attending/consulting [facility] provider listed and b) the [facility] team listed 2. Log into [email or web address] and use [place]'s universal password to access. If you do not have the password, please contact the hospital operator. 3. Locate the [facility] provider you are looking for under [facility] and page to a number that you can be directly reached. 4. If you still have difficulty reaching the provider, please page the [facility] (Director on Call) for the Hospitalists listed on amion for assistance.

## 2020-11-15 NOTE — Plan of Care (Signed)
  Problem: Clinical Measurements: Goal: Respiratory complications will improve Outcome: Progressing   Problem: Activity: Goal: Risk for activity intolerance will decrease Outcome: Progressing   Problem: Nutrition: Goal: Adequate nutrition will be maintained Outcome: Progressing   Problem: Coping: Goal: Level of anxiety will decrease Outcome: Progressing   Problem: Safety: Goal: Ability to remain free from injury will improve Outcome: Progressing   

## 2020-11-15 NOTE — Consult Note (Signed)
Consultation Note Date: 11/15/2020   Patient Name: Phyllis Hebert  DOB: 01-23-1956  MRN: [id number]  Age / Sex: 65 y.o., female  PCP: [name], MD Referring Physician: Ezekiel Slocumb, DO  Reason for Consultation: Establishing goals of care  HPI/Patient Profile: 65 y.o. female  admitted on 11/14/2020     Clinical Assessment and Goals of Care:  Phyllis Hebert is a 65 y.o. female with medical history significant of stage 4 cervical cancer, recently enrolled in hospice, also with history of anxiety,presented with hematuria. She had an event several months ago where she had significant vaginal bleeding; so when she saw her current symptoms, she became concerned and came to the ED where she was diagnosed with probable UTI and hematuria and MRI lumbar showing previously known bilat hydroureteronephrosis but no spinal injury. TRH was called for admission. A palliative consult has also been requested.    Chart reviewed, patient seen, discussed with patient and husband was also present at the bedside.  Subsequently also discussed with hospice liaison colleagues.  Palliative medicine is specialized medical care for people living with serious illness. It focuses on providing relief from the symptoms and stress of a serious illness. The goal is to improve quality of life for both the patient and the family.  Goals of care: Broad aims of medical therapy in relation to the patient's values and preferences. Our aim is to provide medical care aimed at enabling patients to achieve the goals that matter most to them, given the circumstances of their particular medical situation and their constraints.   Goals wishes and values important to the patient and family as a unit attempted to be explored.  We discussed about symptom management.  Offered active listening supportive care and compassionate presence.  See below.   Palliative to follow  HCPOA Husband who is at the bedside.    SUMMARY OF RECOMMENDATIONS   Agree with DNR Continue current pain and non pain symptom management, patient to have IV PRN opioids and benzodiazepines available. Continue current PO regimen and monitor for need for low dose continuous infusion of opioids for symptom control if needed.  Appreciate close and ongoing support from [place], monitor hospital course to help determine best disposition option.  Thank you for the consult.   Code Status/Advance Care Planning:  DNR    Symptom Management:      Palliative Prophylaxis:   Delirium Protocol     Psycho-social/Spiritual:   Desire for further Chaplaincy support:yes  Additional Recommendations: Caregiving  Support/Resources  Prognosis:   Guarded   Discharge Planning: To Be Determined      Primary Diagnoses: Present on Admission: . Hematuria   I have reviewed the medical record, interviewed the patient and family, and examined the patient. The following aspects are pertinent.  Past Medical History:  Diagnosis Date  . Anemia   . Anxiety   . Arthritis   . Cervical cancer, FIGO stage IVB [facility]) oncologist--- dr gorsuch/ dr Sondra Come   dx inital dx  09/ 2021, w/ mets to bone, started chemo 04-30-2020 and stopped 05-21-2020 due to worsening pancytopenia;  completed IMRT 06-16-2020 and scheduled to start brachytherapy boost high dose 06-21-2020  . Chemotherapy-induced nausea   . Cutaneous lupus erythematosus    followed by [facility]-- Marella Chimes PA  . Dyslipidemia   . Family history of breast cancer   . Family history of uterine cancer   . GERD (gastroesophageal reflux disease)   . Hemorrhoids   . History of 2019 novel coronavirus disease (COVID-19) 06/20/2019   [facility] , had mild symptoms that resolved  . History of colon polyps 04/06/2020   hyerplastic  . History of external beam radiation therapy 05-03-2020  to  06-16-2020   cervical cancer  . History of gastric polyp 04/06/2020   hyperplastic  . History of gastric ulcer   . History of radiation therapy 05/03/20-06/16/20 and 06/21/20-07/19/20   Cervical IMRT and Cervical HDR [facility]: Dr Gery Pray  . Pancytopenia due to chemotherapy ([place])   . Wears glasses    Social History   Socioeconomic History  . Marital status: Married    Spouse name: [name]  . Number of children: 2  . Years of education: Not on file  . Highest education level: Not on file  Occupational History  . Occupation: retired  Tobacco Use  . Smoking status: Never Smoker  . Smokeless tobacco: Never Used  Vaping Use  . Vaping Use: Never used  Substance and Sexual Activity  . Alcohol use: No  . Drug use: Never  . Sexual activity: Not on file  Other Topics Concern  . Not on file  Social History Narrative  . Not on file   Social Determinants of Health   Financial Resource Strain: Not on file  Food Insecurity: Not on file  Transportation Needs: Not on file  Physical Activity: Not on file  Stress: Not on file  Social Connections: Not on file   Family History  Problem Relation Age of Onset  . Hypertension Mother   . Emphysema Mother   . Uterine cancer Mother 31  . Hypertension Father   . Stroke Father   . Hypertension Brother   . Healthy Daughter   . Breast cancer Maternal Aunt        d. 16  . Leukemia Cousin        d. 48s   Scheduled Meds: . Chlorhexidine Gluconate Cloth  6 each Topical Daily  . LORazepam  0.5 mg Intravenous Once  . morphine  60 mg Oral TID  . pantoprazole  40 mg Oral BID  . polyethylene glycol  17 g Oral Daily  . predniSONE  40 mg Oral Q breakfast  . senna-docusate  2 tablet Oral BID  . sodium chloride flush  10-40 mL Intracatheter Q12H   Continuous Infusions: . sodium chloride 100 mL/hr at 11/15/20 0840  . cefTRIAXone (ROCEPHIN)  IV 1 g (11/15/20 0605)   PRN Meds:.acetaminophen **OR** acetaminophen, HYDROmorphone (DILAUDID)  injection, HYDROmorphone, LORazepam, prochlorperazine, sodium chloride flush Medications Prior to Admission:  Prior to Admission medications   Medication Sig Start Date End Date Taking? Authorizing Provider  HYDROmorphone (DILAUDID) 8 MG tablet Take 1 tablet (8 mg total) by mouth every 4 (four) hours as needed for severe pain. 11/09/20  Yes Gorsuch, Ni, MD  LORazepam (ATIVAN) 0.5 MG tablet Take 1 tablet (0.5 mg total) by mouth every 8 (eight) hours as needed for anxiety. 11/09/20  Yes [name], MD  morphine (MS CONTIN) 60 MG 12 hr tablet Take 1 tablet (60 mg total) by mouth in the morning, at noon, and at bedtime. TAKE 1 TABLET BY MOUTH 3 TIMES DAILY 11/09/20  Yes Gorsuch, Ni, MD  pantoprazole (PROTONIX) 40 MG tablet Take 1 tablet (40 mg total) by mouth 2 (two) times daily. 04/06/20  Yes Thornton Park, MD  polyethylene glycol (MIRALAX / GLYCOLAX) 17 g packet Dissolve 17 g in water or juice and drink once daily. 10/15/20  Yes Mariel Aloe, MD  predniSONE (DELTASONE) 20 MG tablet Take 2 tablets (40 mg total) by mouth daily with breakfast. 10/04/20  Yes Gorsuch, Ni, MD  prochlorperazine (COMPAZINE) 10 MG tablet TAKE 1 TABLET BY MOUTH EVERY 6 HOURS AS NEEDED FOR NAUSEA AND/OR VOMITING Patient taking differently: Take 10 mg by mouth every 6 (six) hours as needed. 10/04/20 10/04/21 Yes Gorsuch, Ni, MD  senna-docusate (SENOKOT-S) 8.6-50 MG tablet Take 2 tablets by mouth 2 (two) times daily. 10/14/20  Yes Mariel Aloe, MD   Allergies  Allergen Reactions  . Penicillins Rash   Review of Systems Denies pain currently.   Physical Exam Appears chronically ill with generalized weakness Resting in bed Shallow clear breath sounds Regular work of breathing S1-S2 No edema Somewhat flattened affect at times but overall with calm and appropriate in affect at the time of initial palliative encounter today.  Vital Signs: BP (!) 155/92 (BP Location: Left Arm)   Pulse (!) 126   Temp (!) 100.7 F (38.2  C) (Oral)   Resp 18   SpO2 100%  Pain Scale: 0-10 POSS *See Group Information*: 1-Acceptable,Awake and alert Pain Score: 5    SpO2: SpO2: 100 % O2 Device:SpO2: 100 % O2 Flow Rate: .O2 Flow Rate (L/min): 5 L/min  IO: Intake/output summary:   Intake/Output Summary (Last 24 hours) at 11/15/2020 1326 Last data filed at 11/15/2020 4825 Gross per 24 hour  Intake 1830.57 ml  Output 200 ml  Net 1630.57 ml    LBM: Last BM Date: 11/15/20 Baseline Weight:   Most recent weight:       Palliative Assessment/Data:   PPS 40%  Time In:  12 Time Out:  1300 Time Total:  60 min.  Greater than 50%  of this time was spent counseling and coordinating care related to the above assessment and plan.  Signed by: Loistine Chance, MD   Please contact Palliative Medicine Team phone at [phone] for questions and concerns.  For individual provider: See Shea Evans

## 2020-11-15 NOTE — Progress Notes (Signed)
Pt restless, stating she had to get up. Assisted to [facility] using gait belt and 2 person assist. Pt very anxious while on the commode trying to pass stool/gas. Kept stating she felt like she was going to pass out. Provided water and cool clothes while she was on commode, encouraged deep breathing. Was able to get her back in bed. Pt was able to relax a bit more after BM. Pt repositioned to her side. Mediated for pain as ordered. Ice pack applied to vaginal and perineum for additional comfort. Provided reassurance and emotional support. Will continue to monitor and assist.

## 2020-11-16 ENCOUNTER — Inpatient Hospital Stay: Payer: Managed Care, Other (non HMO) | Admitting: Hematology and Oncology

## 2020-11-16 DIAGNOSIS — Z7189 Other specified counseling: Secondary | ICD-10-CM | POA: Diagnosis not present

## 2020-11-16 DIAGNOSIS — G893 Neoplasm related pain (acute) (chronic): Secondary | ICD-10-CM

## 2020-11-16 DIAGNOSIS — R52 Pain, unspecified: Secondary | ICD-10-CM | POA: Diagnosis not present

## 2020-11-16 DIAGNOSIS — C539 Malignant neoplasm of cervix uteri, unspecified: Secondary | ICD-10-CM

## 2020-11-16 DIAGNOSIS — R319 Hematuria, unspecified: Secondary | ICD-10-CM | POA: Diagnosis not present

## 2020-11-16 DIAGNOSIS — K5900 Constipation, unspecified: Secondary | ICD-10-CM

## 2020-11-16 DIAGNOSIS — D61818 Other pancytopenia: Principal | ICD-10-CM

## 2020-11-16 DIAGNOSIS — D649 Anemia, unspecified: Secondary | ICD-10-CM | POA: Diagnosis not present

## 2020-11-16 LAB — HEMOGLOBIN AND HEMATOCRIT, BLOOD
HCT: 26.3 % — ABNORMAL LOW (ref 36.0–46.0)
Hemoglobin: 8.5 g/dL — ABNORMAL LOW (ref 12.0–15.0)

## 2020-11-16 LAB — BASIC METABOLIC PANEL
Anion gap: 7 (ref 5–15)
BUN: 14 mg/dL (ref 8–23)
CO2: 25 mmol/L (ref 22–32)
Calcium: 8.5 mg/dL — ABNORMAL LOW (ref 8.9–10.3)
Chloride: 107 mmol/L (ref 98–111)
Creatinine, Ser: 0.92 mg/dL (ref 0.44–1.00)
GFR, Estimated: >60 mL/min (ref >60)
Glucose, Bld: 120 mg/dL — ABNORMAL HIGH (ref 70–99)
Potassium: 3.3 mmol/L — ABNORMAL LOW (ref 3.5–5.1)
Sodium: 139 mmol/L (ref 135–145)

## 2020-11-16 LAB — CBC
HCT: 21.8 % — ABNORMAL LOW (ref 36.0–46.0)
Hemoglobin: 6.7 g/dL — CL (ref 12.0–15.0)
MCH: 28.5 pg (ref 26.0–34.0)
MCHC: 30.7 g/dL (ref 30.0–36.0)
MCV: 92.8 fL (ref 80.0–100.0)
Platelets: 59 10*3/uL — ABNORMAL LOW (ref 150–400)
RBC: 2.35 MIL/uL — ABNORMAL LOW (ref 3.87–5.11)
RDW: 19.3 % — ABNORMAL HIGH (ref 11.5–15.5)
WBC: 9 10*3/uL (ref 4.0–10.5)
nRBC: 0 % (ref 0.0–0.2)

## 2020-11-16 LAB — PREPARE RBC (CROSSMATCH)

## 2020-11-16 MED ORDER — SODIUM CHLORIDE 0.9% IV SOLUTION: Freq: Once | INTRAVENOUS | Status: DC

## 2020-11-16 MED ORDER — DIPHENHYDRAMINE HCL 25 MG PO CAPS
25.0000 mg | ORAL_CAPSULE | Freq: Once | ORAL | Status: AC
  Administered 2020-11-16: 25 mg via ORAL
  Filled 2020-11-16: qty 1

## 2020-11-16 MED ORDER — HEPARIN SOD (PORK) LOCK FLUSH 100 UNIT/ML IV SOLN
500.0000 [IU] | INTRAVENOUS | Status: AC | PRN
  Administered 2020-11-16: 500 [IU]
  Filled 2020-11-16: qty 5

## 2020-11-16 MED ORDER — ACETAMINOPHEN 325 MG PO TABS
650.0000 mg | ORAL_TABLET | Freq: Once | ORAL | Status: AC
  Administered 2020-11-16: 650 mg via ORAL
  Filled 2020-11-16: qty 2

## 2020-11-16 NOTE — Plan of Care (Signed)
  Problem: Education: Goal: Knowledge of General Education information will improve Description: Including pain rating scale, medication(s)/side effects and non-pharmacologic comfort measures Outcome: Progressing   Problem: Activity: Goal: Risk for activity intolerance will decrease Outcome: Progressing   Problem: Coping: Goal: Level of anxiety will decrease Outcome: Progressing   Problem: Elimination: Goal: Will not experience complications related to bowel motility Outcome: Progressing Goal: Will not experience complications related to urinary retention Outcome: Progressing   Problem: Pain Managment: Goal: General experience of comfort will improve Outcome: Progressing   Problem: Safety: Goal: Ability to remain free from injury will improve Outcome: Progressing   Problem: Skin Integrity: Goal: Risk for impaired skin integrity will decrease Outcome: Progressing   Problem: Urinary Elimination: Goal: Signs and symptoms of infection will decrease Outcome: Progressing

## 2020-11-16 NOTE — Plan of Care (Signed)
  Problem: Education: Goal: Knowledge of General Education information will improve Description: Including pain rating scale, medication(s)/side effects and non-pharmacologic comfort measures Outcome: Adequate for Discharge   Problem: Health Behavior/Discharge Planning: Goal: Ability to manage health-related needs will improve Outcome: Adequate for Discharge   Problem: Clinical Measurements: Goal: Ability to maintain clinical measurements within normal limits will improve Outcome: Adequate for Discharge Goal: Will remain free from infection Outcome: Adequate for Discharge Goal: Diagnostic test results will improve Outcome: Adequate for Discharge Goal: Respiratory complications will improve Outcome: Adequate for Discharge Goal: Cardiovascular complication will be avoided Outcome: Adequate for Discharge   Problem: Activity: Goal: Risk for activity intolerance will decrease Outcome: Adequate for Discharge   Problem: Nutrition: Goal: Adequate nutrition will be maintained Outcome: Adequate for Discharge   Problem: Coping: Goal: Level of anxiety will decrease Outcome: Adequate for Discharge   Problem: Elimination: Goal: Will not experience complications related to bowel motility Outcome: Adequate for Discharge Goal: Will not experience complications related to urinary retention Outcome: Adequate for Discharge   Problem: Pain Managment: Goal: General experience of comfort will improve Outcome: Adequate for Discharge   Problem: Safety: Goal: Ability to remain free from injury will improve Outcome: Adequate for Discharge   Problem: Skin Integrity: Goal: Risk for impaired skin integrity will decrease Outcome: Adequate for Discharge   Problem: Urinary Elimination: Goal: Signs and symptoms of infection will decrease Outcome: Adequate for Discharge   

## 2020-11-16 NOTE — TOC Progression Note (Signed)
Transition of Care [facility]) - Progression Note    Patient Details  Name: Phyllis Hebert MRN: [id number] Date of Birth: 1955/12/25  Transition of Care [facility]) CM/SW Contact  Purcell Mouton, RN Phone Number: 11/16/2020, 12:23 PM  Clinical Narrative:     A call was made to [place], rep Cheri, pt is active and plan to return home today.        Expected Discharge Plan and Services           Expected Discharge Date: 11/16/20                                     Social Determinants of Health (SDOH) Interventions    Readmission Risk Interventions No flowsheet data found.

## 2020-11-16 NOTE — Progress Notes (Signed)
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 The patient is symptomatic from anemia and the hemoglobin level is critically low.  Some of the side-effects to be expected including risks of transfusion reactions, chills, infection, syndrome of volume overload and risk of hospitalization from various reasons and the patient is willing to proceed and went ahead to sign consent today. She will receive 1 unit of blood She does not need platelet transfusion  Cancer associated pain Her pain control is reasonable Continue the same  Other constipation We discussed the importance of aggressive laxative therapy  Goals of care, counseling/discussion She is in agreement for hospice referral She is aware of her terminal nature of her disease and poor prognosis  CODE STATUS DNR  Discharge planning She can be discharged later today after completion of blood transfusion I have set up follow-up next week  [name], MD 11/16/2020 8:44 AM  Subjective:  She is seen in the hospital She has a virtual visit set up this morning for pain assessment but she is seen in her room instead due to hospitalization.  She was admitted since March 29 for significant vaginal bleeding.  Presumed diagnosis was possible UTI.  She received IV fluid resuscitation and MRI for evaluation of back pain Since then, she felt better Her pain is reasonably controlled She has no constipation No fever or chills Urinalysis and urine culture did not confirm diagnosis of UTI Her daughter, Burman Nieves is by  bedside  Objective:  Vitals:   11/15/20 2148 11/16/20 0658  BP: (!) 117/91 125/89  Pulse: 99 94  Resp: 18 18  Temp: 98.3 F (36.8 C) 98.4 F (36.9 C)  SpO2: 91% 100%     Intake/Output Summary (Last 24 hours) at 11/16/2020 0844 Last data filed at 11/16/2020 0600 Gross per 24 hour  Intake 2622.8 ml  Output 800 ml  Net 1822.8 ml    GENERAL:alert, no distress, she looks pale NEURO: alert & oriented x 3 with fluent speech, no focal motor/sensory deficits   Labs:  Recent Labs    10/14/20 0955 10/18/20 1241 11/14/20 0246 11/15/20 0528 11/16/20 0616  NA 141 139 138 137 139  K 3.4* 4.0 3.6 3.3* 3.3*  CL 105 101 101 105 107  CO2 26 28 25 25 25   GLUCOSE 128* 166* 116* 129* 120*  BUN 11 19 23 17 14   CREATININE 0.87 1.10* 1.09* 1.07* 0.92  CALCIUM 9.1 9.3 10.1 8.9 8.5*  GFRNONAA >60 56* 57* 58* >60  PROT 6.0* 6.3*  --  6.1*  --   ALBUMIN 2.5* 2.8*  --  2.9*  --   AST 15 11*  --  9*  --   ALT 18 13  --  12  --   ALKPHOS 61 63  --  52  --   BILITOT 0.4 0.5  --  0.7  --     Studies:  CT ABDOMEN PELVIS WO CONTRAST  Result Date: 11/14/2020 CLINICAL DATA:  Hematuria with unknown cause. History of cervical cancer with metastatic disease, on chemotherapy. EXAM: CT ABDOMEN AND PELVIS WITHOUT CONTRAST TECHNIQUE:  Multidetector CT imaging of the abdomen and pelvis was performed following the standard protocol without IV contrast. COMPARISON:  07/27/2020 PET CT FINDINGS: Lower chest:  No contributory findings. Hepatobiliary: Subcapsular cyst on the central ventral liver.No evidence of biliary obstruction or stone. Pancreas: Unremarkable. Spleen: Unremarkable.  Left upper quadrant splenules. Adrenals/Urinary Tract: Negative adrenals. Bilateral hydroureteronephrosis, new on the right since prior. There is left renal cortical thinning. A right internal ureteral stent is in good position. Collapsed bladder. Stomach/Bowel:  No obstruction. No visible bowel inflammation. Vascular/Lymphatic: No  acute vascular abnormality. No mass or adenopathy. Reproductive:Low-density with trapped gas at the level of the treated cervix, a change from prior. The abnormal area measures 6 x 3 x 3 cm. Symmetric ovaries with small low-density cysts. Low-density distension of the upper endometrial cavity similar to comparison PET, possibly trapped fluid. Other: No ascites or pneumoperitoneum. Musculoskeletal: No acute abnormalities. IMPRESSION: 1. Right hydroureteronephrosis despite a well-positioned internal ureteral stent. More chronic left hydroureteronephrosis with cortical thinning that is similar to February 2022. 2. Interval necrotic changes at the treated cervix with debris like appearance spanning a 6 x 3 x 3 cm area. Electronically Signed   By: Monte Fantasia M.D.   On: 11/14/2020 08:25   MR LUMBAR SPINE WO CONTRAST  Result Date: 11/14/2020 CLINICAL DATA:  Low back pain with cauda equina syndrome suspected EXAM: MRI LUMBAR SPINE WITHOUT CONTRAST TECHNIQUE: Multiplanar, multisequence MR imaging of the lumbar spine was performed. No intravenous contrast was administered. COMPARISON:  None. FINDINGS: Segmentation:  Rudimentary disc space at the level numbered S1-2. Alignment:  Slight retrolisthesis at L5-S1, chronic Vertebrae: No fracture, evidence of discitis, or bone lesion. Accentuated fatty marrow at L5 and below, attributed to history of radiotherapy. Conus medullaris and cauda equina: Conus extends to the L1-2 level. Conus and cauda equina appear normal. Paraspinal and other soft tissues: Bilateral hydroureteronephrosis which is known from prior abdominal CT. Saccular hypointense structure at the upper right renal hilum measuring 13 mm. Disc levels: Focal advanced disc narrowing with mild bulging and ridging at L5-S1. There is degenerative facet spurring at L3-4 and below. Mild disc bulging at L3-4 and L4-5. No neural impingement. IMPRESSION: 1. No acute finding.  No evidence of malignancy in the spine. 2.  Bilateral hydroureteronephrosis as described on preceding CT. 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 3. Lower lumbar degeneration without inflammation or impingement. Electronically Signed   By: Monte Fantasia M.D.   On: 11/14/2020 09:31

## 2020-11-16 NOTE — Discharge Summary (Signed)
Physician Discharge Summary  Phyllis Hebert ASN:[id number] DOB: 04/18/1956 DOA: 11/14/2020  PCP: [name], MD  Admit date: 11/14/2020 Discharge date: 11/16/2020  Admitted From: home Discharge disposition: home   Recommendations for Outpatient Follow-Up:   1. *resume home hospice    Discharge Diagnosis:      Discharge Condition: Improved.  Diet recommendation:Regular.  Wound care: None.  Code status: DNR   History of Present Illness:   Phyllis Hebert is a 65 y.o. female with medical history significant of stage 4 cervical cancer, anxiety. Presenting with hematuria. She reports her symptoms began around midnight last night. She had no pain, N/V, or fever. She noted red urine that seemed continuous. She had an event several months ago where she had significant vaginal bleeding; so when she saw her current symptoms, she became concerned and came to the ED. She denies any other aggravating or alleviating factors.    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 She will receive 1 unit of blood -no sign of UTI so abx were d/c'd  Cancer associated pain pain control is reasonable  Other constipation - laxative therapy  Hypokalemia -repleted   Medical Consultants:  Oncology Palliative care    Discharge Exam:   Vitals:   11/15/20 2148 11/16/20 0658  BP: (!) 117/91 125/89  Pulse: 99 94  Resp: 18 18  Temp: 98.3 F (36.8 C) 98.4 F (36.9 C)  SpO2: 91% 100%   Vitals:   11/15/20 0602 11/15/20 1359 11/15/20 2148 11/16/20 0658  BP: (!) 155/92 133/88 (!) 117/91 125/89  Pulse: (!) 126 (!) 107 99 94  Resp: 18  18 18   Temp: (!) 100.7 F (38.2 C) 100.2 F (37.9 C) 98.3 F (36.8 C) 98.4 F (36.9 C)  TempSrc: Oral Oral Oral Oral  SpO2: 100% 100% 91%  100%    General exam: Appears calm and comfortable.   The results of significant diagnostics from this hospitalization (including imaging, microbiology, ancillary and laboratory) are listed below for reference.     Procedures and Diagnostic Studies:   CT ABDOMEN PELVIS WO CONTRAST  Result Date: 11/14/2020 CLINICAL DATA:  Hematuria with unknown cause. History of cervical cancer with metastatic disease, on chemotherapy. EXAM: CT ABDOMEN AND PELVIS WITHOUT CONTRAST TECHNIQUE: Multidetector CT imaging of the abdomen and pelvis was performed following the standard protocol without IV contrast. COMPARISON:  07/27/2020 PET CT FINDINGS: Lower chest:  No contributory findings. Hepatobiliary: Subcapsular cyst on the central ventral liver.No evidence of biliary obstruction or stone. Pancreas: Unremarkable. Spleen: Unremarkable.  Left upper quadrant splenules. Adrenals/Urinary Tract: Negative adrenals. Bilateral hydroureteronephrosis, new on the right since prior. There is left renal cortical thinning. A right internal ureteral stent is in good position. Collapsed bladder. Stomach/Bowel:  No obstruction. No visible bowel inflammation. Vascular/Lymphatic: No acute vascular abnormality. No mass or adenopathy. Reproductive:Low-density with trapped gas at the level of the treated cervix, a change from prior. The abnormal area measures 6 x 3 x 3 cm. Symmetric ovaries with small low-density cysts. Low-density distension of the upper endometrial cavity similar to comparison PET, possibly trapped fluid. Other: No ascites or pneumoperitoneum. Musculoskeletal: No acute abnormalities. IMPRESSION: 1. Right hydroureteronephrosis despite a well-positioned internal ureteral stent. More chronic left hydroureteronephrosis with cortical thinning that is similar to February 2022. 2. Interval necrotic changes  at the treated cervix with debris like appearance spanning a 6 x 3 x 3 cm area. Electronically Signed   By: Monte Fantasia M.D.    On: 11/14/2020 08:25   MR LUMBAR SPINE WO CONTRAST  Result Date: 11/14/2020 CLINICAL DATA:  Low back pain with cauda equina syndrome suspected EXAM: MRI LUMBAR SPINE WITHOUT CONTRAST TECHNIQUE: Multiplanar, multisequence MR imaging of the lumbar spine was performed. No intravenous contrast was administered. COMPARISON:  None. FINDINGS: Segmentation:  Rudimentary disc space at the level numbered S1-2. Alignment:  Slight retrolisthesis at L5-S1, chronic Vertebrae: No fracture, evidence of discitis, or bone lesion. Accentuated fatty marrow at L5 and below, attributed to history of radiotherapy. Conus medullaris and cauda equina: Conus extends to the L1-2 level. Conus and cauda equina appear normal. Paraspinal and other soft tissues: Bilateral hydroureteronephrosis which is known from prior abdominal CT. Saccular hypointense structure at the upper right renal hilum measuring 13 mm. Disc levels: Focal advanced disc narrowing with mild bulging and ridging at L5-S1. There is degenerative facet spurring at L3-4 and below. Mild disc bulging at L3-4 and L4-5. No neural impingement. IMPRESSION: 1. No acute finding.  No evidence of malignancy in the spine. 2. Bilateral hydroureteronephrosis as described on preceding CT. 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 3. Lower lumbar degeneration without inflammation or impingement. Electronically Signed   By: Monte Fantasia M.D.   On: 11/14/2020 09:31     Labs:   Basic Metabolic Panel: Recent Labs  Lab 11/14/20 0246 11/15/20 0528 11/16/20 0616  NA 138 137 139  K 3.6 3.3* 3.3*  CL 101 105 107  CO2 25 25 25   GLUCOSE 116* 129* 120*  BUN 23 17 14   CREATININE 1.09* 1.07* 0.92  CALCIUM 10.1 8.9 8.5*   GFR CrCl cannot be calculated (Unknown ideal weight.). Liver Function Tests: Recent Labs  Lab 11/15/20 0528  AST 9*  ALT 12  ALKPHOS 52  BILITOT 0.7  PROT 6.1*  ALBUMIN 2.9*   No results for input(s): LIPASE, AMYLASE in  the last 168 hours. No results for input(s): AMMONIA in the last 168 hours. Coagulation profile No results for input(s): INR, PROTIME in the last 168 hours.  CBC: Recent Labs  Lab 11/14/20 0246 11/14/20 1410 11/14/20 2128 11/15/20 0528 11/16/20 0616  WBC 9.4  --   --  8.0 9.0  NEUTROABS 8.2*  --   --   --   --   HGB 8.2* 8.2* 7.7* 7.7* 6.7*  HCT 26.1* 26.8* 24.8* 25.2* 21.8*  MCV 90.0  --   --  92.6 92.8  PLT 95*  --   --  67* 59*   Cardiac Enzymes: No results for input(s): CKTOTAL, CKMB, CKMBINDEX, TROPONINI in the last 168 hours. BNP: Invalid input(s): POCBNP CBG: No results for input(s): GLUCAP in the last 168 hours. D-Dimer No results for input(s): DDIMER in the last 72 hours. Hgb A1c No results for input(s): HGBA1C in the last 72 hours. Lipid Profile No results for input(s): CHOL, HDL, LDLCALC, TRIG, CHOLHDL, LDLDIRECT in the last 72 hours. Thyroid function studies No results for input(s): TSH, T4TOTAL, T3FREE, THYROIDAB in the last 72 hours.  Invalid input(s): FREET3 Anemia work up No results for input(s): VITAMINB12, FOLATE, FERRITIN, TIBC, IRON, RETICCTPCT in the last 72 hours. Microbiology Recent Results (from the past 240 hour(s))  Urine culture     Status: Abnormal   Collection Time: 11/14/20  3:19 AM   Specimen: Urine,  Clean Catch  Result Value Ref Range Status   Specimen Description   Final    URINE, CLEAN CATCH Performed at [facility], [place] [address], [place], [place] [address]    Special Requests   Final    NONE Performed at [facility], [place] [address], [place], [place] [address]    Culture (A)  Final    <10,000 COLONIES/mL INSIGNIFICANT GROWTH Performed at [place] [address] [place], [place] [address]    Report Status 11/15/2020 FINAL  Final  SARS CORONAVIRUS 2 (TAT 6-24 HRS) Nasopharyngeal Nasopharyngeal Swab     Status: None   Collection Time: 11/14/20  5:43 AM   Specimen:  Nasopharyngeal Swab  Result Value Ref Range Status   SARS Coronavirus 2 NEGATIVE NEGATIVE Final    Comment: (NOTE) SARS-CoV-2 target nucleic acids are NOT DETECTED.  The SARS-CoV-2 RNA is generally detectable in upper and lower respiratory specimens during the acute phase of infection. Negative results do not preclude SARS-CoV-2 infection, do not rule out co-infections with other pathogens, and should not be used as the sole basis for treatment or other patient management decisions. Negative results must be combined with clinical observations, patient history, and epidemiological information. The expected result is Negative.  Fact Sheet for Patients: SugarRoll.be  Fact Sheet for Healthcare Providers: https://www.woods-mathews.com/  This test is not yet approved or cleared by the [place] FDA and  has been authorized for detection and/or diagnosis of SARS-CoV-2 by FDA under an Emergency Use Authorization (EUA). This EUA will remain  in effect (meaning this test can be used) for the duration of the COVID-19 declaration under Se ction 564(b)(1) of the Act, 21 U.S.C. section 360bbb-3(b)(1), unless the authorization is terminated or revoked sooner.  Performed at [facility], [place] [address], [place], [place] [address]      Discharge Instructions:   Discharge Instructions    Diet general   Complete by: As directed    Increase activity slowly   Complete by: As directed      Allergies as of 11/16/2020      Reactions   Penicillins Rash      Medication List    TAKE these medications   HYDROmorphone 8 MG tablet Commonly known as: Dilaudid Take 1 tablet (8 mg total) by mouth every 4 (four) hours as needed for severe pain.   LORazepam 0.5 MG tablet Commonly known as: ATIVAN Take 1 tablet (0.5 mg total) by mouth every 8 (eight) hours as needed for anxiety.   morphine 60 MG 12 hr tablet Commonly known as: MS CONTIN Take  1 tablet (60 mg total) by mouth in the morning, at noon, and at bedtime. TAKE 1 TABLET BY MOUTH 3 TIMES DAILY   pantoprazole 40 MG tablet Commonly known as: PROTONIX Take 1 tablet (40 mg total) by mouth 2 (two) times daily.   polyethylene glycol 17 g packet Commonly known as: MIRALAX / GLYCOLAX Dissolve 17 g in water or juice and drink once daily.   predniSONE 20 MG tablet Commonly known as: DELTASONE Take 2 tablets (40 mg total) by mouth daily with breakfast.   prochlorperazine 10 MG tablet Commonly known as: COMPAZINE TAKE 1 TABLET BY MOUTH EVERY 6 HOURS AS NEEDED FOR NAUSEA AND/OR VOMITING What changed: how much to take   senna-docusate 8.6-50 MG tablet Commonly known as: Senokot-S Take 2 tablets by mouth 2 (two) times daily.         Time  coordinating discharge: 35 min  Signed:  Geradine Girt DO  [facility] 11/16/2020, 8:39 AM

## 2020-11-16 NOTE — Progress Notes (Signed)
Daily Progress Note   Patient Name: Phyllis Hebert       Date: 11/16/2020 DOB: 06-Oct-1955  Age: 65 y.o. MRN#: [id number] Attending Physician: Geradine Girt, DO Primary Care Physician: [name], MD Admit Date: 11/14/2020  Reason for Consultation/Follow-up: Establishing goals of care and Pain control  Subjective: I saw and examined Phyllis Hebert today.  She reports that pain is currently well controlled and she finds her current pain regimen effective.  She did not feel that we needed to discuss any possible changes today.  Discussed plan to return home with hospice.  She tells me that plan is to get transfusion and then transition home after she receives transfusion.  She is comfortable with this plan and understands that home hospice is available for any needs that arise on her return home.  Length of Stay: 2  Current Medications: Scheduled Meds:  . sodium chloride   Intravenous Once  . sodium chloride   Intravenous Once  . acetaminophen  650 mg Oral Once  . Chlorhexidine Gluconate Cloth  6 each Topical Daily  . diphenhydrAMINE  25 mg Oral Once  . morphine  60 mg Oral TID  . pantoprazole  40 mg Oral BID  . polyethylene glycol  17 g Oral Daily  . predniSONE  40 mg Oral Q breakfast  . senna-docusate  2 tablet Oral BID  . sodium chloride flush  10-40 mL Intracatheter Q12H    Continuous Infusions: . sodium chloride 100 mL/hr at 11/15/20 0840    PRN Meds: acetaminophen **OR** acetaminophen, HYDROmorphone (DILAUDID) injection, HYDROmorphone, LORazepam, prochlorperazine, sodium chloride flush  Physical Exam        General: Alert, awake, in no acute distress.  Heart: Regular rate and rhythm. No murmur appreciated. Lungs: Good air movement, clear Abdomen: Soft, nontender,  nondistended, positive bowel sounds.  Ext: No significant edema Skin: Warm and dry Neuro: Grossly intact, nonfocal.   Vital Signs: BP 125/89 (BP Location: Right Arm)   Pulse 94   Temp 98.4 F (36.9 C) (Oral)   Resp 18   SpO2 100%  SpO2: SpO2: 100 % O2 Device: O2 Device: Nasal Cannula O2 Flow Rate: O2 Flow Rate (L/min): 5 L/min  Intake/output summary:   Intake/Output Summary (Last 24 hours) at 11/16/2020 0950 Last data filed at 11/16/2020 0600 Gross per  24 hour  Intake 2622.8 ml  Output 800 ml  Net 1822.8 ml   LBM: Last BM Date: 11/15/20 Baseline Weight:   Most recent weight:         Palliative Assessment/Data:      Patient Active Problem List   Diagnosis Date Noted  . Hematuria 11/14/2020  . Other constipation 11/09/2020  . Cancer associated pain 10/18/2020  . Physical debility 10/14/2020  . Hydronephrosis 10/14/2020  . Vaginal bleeding 10/08/2020  . Hypokalemia 10/04/2020  . Deficiency anemia 10/04/2020  . Bilateral hydronephrosis 10/04/2020  . Drug-induced hyperglycemia 09/07/2020  . Hypomagnesemia 08/24/2020  . GERD (gastroesophageal reflux disease)   . Acquired hypothyroidism 07/13/2020  . Genetic testing 06/21/2020  . Family history of breast cancer   . Family history of uterine cancer   . Pancytopenia, acquired ([place]) 05/26/2020  . Nausea without vomiting 05/20/2020  . Pain in joint, pelvic region and thigh 05/12/2020  . Anxiety disorder due to medical condition 05/05/2020  . Essential hypertension 05/05/2020  . Family history of cancer 04/29/2020  . Goals of care, counseling/discussion 04/29/2020  . SLE (systemic lupus erythematosus) ([place]) 04/20/2020  . Hydronephrosis of left kidney 04/20/2020  . Preventive measure 04/20/2020  . Metastasis to bone ([place]) 04/19/2020  . Malignant neoplasm of cervix ([place]) 04/01/2020  . COVID-19 virus infection 06/26/2019  . Obesity (BMI 30-39.9) 08/07/2018  . Hypertriglyceridemia 07/02/2017  . Vitamin D deficiency  07/02/2017  . Physical exam 07/02/2017  . 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 She had an event several months ago where she had significant vaginal bleeding; so when she saw her current symptoms, she became concerned and came to the ED where she was diagnosed with probable UTI and hematuria.  Recommendations/Plan: DNR Plan for discharge home later today once blood transfusion complete. Pain, cancer related: Discussed symptom management with Phyllis Hebert.  She feels that pain is currently well controlled on current regimen.  Recommend continue current regimen of MS Contin 60mg  TID and dilaudid 8mg  every 4 hours as needed for breakthrough pain on discharge. Discussed with hospice liaison.  Goals of Care and Additional Recommendations: Limitations on Scope of Treatment: Avoid Hospitalization  Code Status:    Code Status Orders  (From admission, onward)         Start     Ordered   11/14/20 1413  Do not attempt resuscitation (DNR)  Continuous       Question Answer Comment  In the event of cardiac or respiratory ARREST Do not call a "code blue"   In the event of cardiac or respiratory ARREST Do not perform Intubation, CPR, defibrillation or ACLS   In the event of cardiac or respiratory ARREST Use medication by any route, position, wound care, and other measures to relive pain and suffering. May use oxygen, suction and manual treatment of airway obstruction as needed for comfort.   Comments DNR confirmed with patient      11/14/20 1412        Code Status History    Date Active Date Inactive Code Status Order ID Comments User Context   10/08/2020 0907 10/14/2020 1913 Partial Code [id number]  MagrinatVirgie Dad, MD Inpatient   10/08/2020 0234 10/08/2020 0907 Full Code  [id number]  Rhetta Mura, DO ED   07/19/2020 0542 07/19/2020 1413 Full Code [id number]  Gery Pray, MD Inpatient   07/14/2020 0544 07/16/2020 0515 Full Code [id number]  Gery Pray, MD Inpatient   07/06/2020 0617 07/06/2020 1602 Full Code [id number]  Gery Pray, MD Inpatient   07/01/2020 0922 07/01/2020 1532 Full Code [id number]  Gery Pray, MD Inpatient   06/21/2020 0600 06/23/2020 0515 Full Code [id number]  Gery Pray, MD Inpatient   Advance Care Planning Activity    Advance Directive Documentation   Flowsheet Row Most Recent Value  Type of Advance Directive Out of facility DNR (pink MOST or yellow form)  Pre-existing out of facility DNR order (yellow form or pink MOST form) --  "MOST" Form in Place? --      Prognosis:  < 6 months  Discharge Planning: Home with Hospice  Care plan was discussed with patient  Thank you for allowing the Palliative Medicine Team to assist in the care of this patient.   Time In: 0925 Time Out: 0945 Total Time 20 Prolonged Time Billed No      Greater than 50%  of this time was spent counseling and coordinating care related to the above assessment and plan.  Micheline Rough, MD  Please contact Palliative Medicine Team phone at [phone] for questions and concerns.

## 2020-11-17 ENCOUNTER — Telehealth: Payer: Self-pay

## 2020-11-17 LAB — BPAM RBC
Blood Product Expiration Date: 202206152359
ISSUE DATE / TIME: 202205311218
Unit Type and Rh: 1700

## 2020-11-17 LAB — TYPE AND SCREEN
ABO/RH(D): B NEG
Antibody Screen: NEGATIVE
Unit division: 0

## 2020-11-17 NOTE — Telephone Encounter (Signed)
Phyllis Piety, NP with hospice called to clarify goals of care.  Called back and per Dr. Alvy Bimler, Phyllis Hebert is a DNR, she wants hops ice, no radiation, no megace and she wants a blood transfusion next week. Phyllis Hebert verbalized understanding.

## 2020-11-23 ENCOUNTER — Inpatient Hospital Stay: Payer: Managed Care, Other (non HMO)

## 2020-11-23 ENCOUNTER — Other Ambulatory Visit: Payer: Self-pay

## 2020-11-23 ENCOUNTER — Ambulatory Visit: Payer: Managed Care, Other (non HMO) | Admitting: Hematology and Oncology

## 2020-11-23 ENCOUNTER — Encounter: Payer: Self-pay | Admitting: Hematology and Oncology

## 2020-11-23 ENCOUNTER — Other Ambulatory Visit: Payer: Self-pay | Admitting: Hematology and Oncology

## 2020-11-23 ENCOUNTER — Inpatient Hospital Stay: Payer: Managed Care, Other (non HMO) | Attending: Gynecologic Oncology | Admitting: Hematology and Oncology

## 2020-11-23 VITALS — BP 145/79 | HR 121 | Temp 98.9°F | Resp 18

## 2020-11-23 DIAGNOSIS — D61818 Other pancytopenia: Secondary | ICD-10-CM

## 2020-11-23 DIAGNOSIS — Z7189 Other specified counseling: Secondary | ICD-10-CM

## 2020-11-23 DIAGNOSIS — C530 Malignant neoplasm of endocervix: Secondary | ICD-10-CM

## 2020-11-23 DIAGNOSIS — G893 Neoplasm related pain (acute) (chronic): Secondary | ICD-10-CM

## 2020-11-23 DIAGNOSIS — C539 Malignant neoplasm of cervix uteri, unspecified: Secondary | ICD-10-CM | POA: Diagnosis not present

## 2020-11-23 DIAGNOSIS — Z79899 Other long term (current) drug therapy: Secondary | ICD-10-CM | POA: Insufficient documentation

## 2020-11-23 DIAGNOSIS — C7951 Secondary malignant neoplasm of bone: Secondary | ICD-10-CM

## 2020-11-23 DIAGNOSIS — R410 Disorientation, unspecified: Secondary | ICD-10-CM | POA: Insufficient documentation

## 2020-11-23 DIAGNOSIS — Z7952 Long term (current) use of systemic steroids: Secondary | ICD-10-CM | POA: Insufficient documentation

## 2020-11-23 LAB — CMP (CANCER CENTER ONLY)
ALT: 11 U/L (ref 0–44)
AST: 7 U/L — ABNORMAL LOW (ref 15–41)
Albumin: 2.5 g/dL — ABNORMAL LOW (ref 3.5–5.0)
Alkaline Phosphatase: 69 U/L (ref 38–126)
Anion gap: 13 (ref 5–15)
BUN: 19 mg/dL (ref 8–23)
CO2: 28 mmol/L (ref 22–32)
Calcium: 10.8 mg/dL — ABNORMAL HIGH (ref 8.9–10.3)
Chloride: 100 mmol/L (ref 98–111)
Creatinine: 1.01 mg/dL — ABNORMAL HIGH (ref 0.44–1.00)
GFR, Estimated: >60 mL/min (ref >60)
Glucose, Bld: 147 mg/dL — ABNORMAL HIGH (ref 70–99)
Potassium: 3.1 mmol/L — ABNORMAL LOW (ref 3.5–5.1)
Sodium: 141 mmol/L (ref 135–145)
Total Bilirubin: 0.5 mg/dL (ref 0.3–1.2)
Total Protein: 6.4 g/dL — ABNORMAL LOW (ref 6.5–8.1)

## 2020-11-23 LAB — CBC WITH DIFFERENTIAL (CANCER CENTER ONLY)
Abs Immature Granulocytes: 0.13 10*3/uL — ABNORMAL HIGH (ref 0.00–0.07)
Basophils Absolute: 0 10*3/uL (ref 0.0–0.1)
Basophils Relative: 0 %
Eosinophils Absolute: 0 10*3/uL (ref 0.0–0.5)
Eosinophils Relative: 0 %
HCT: 26.3 % — ABNORMAL LOW (ref 36.0–46.0)
Hemoglobin: 8.4 g/dL — ABNORMAL LOW (ref 12.0–15.0)
Immature Granulocytes: 1 %
Lymphocytes Relative: 6 %
Lymphs Abs: 0.6 10*3/uL — ABNORMAL LOW (ref 0.7–4.0)
MCH: 28.4 pg (ref 26.0–34.0)
MCHC: 31.9 g/dL (ref 30.0–36.0)
MCV: 88.9 fL (ref 80.0–100.0)
Monocytes Absolute: 0.5 10*3/uL (ref 0.1–1.0)
Monocytes Relative: 5 %
Neutro Abs: 8.4 10*3/uL — ABNORMAL HIGH (ref 1.7–7.7)
Neutrophils Relative %: 88 %
Platelet Count: 78 10*3/uL — ABNORMAL LOW (ref 150–400)
RBC: 2.96 MIL/uL — ABNORMAL LOW (ref 3.87–5.11)
RDW: 17.4 % — ABNORMAL HIGH (ref 11.5–15.5)
WBC Count: 9.6 10*3/uL (ref 4.0–10.5)
nRBC: 0.3 % — ABNORMAL HIGH (ref 0.0–0.2)

## 2020-11-23 LAB — SAMPLE TO BLOOD BANK

## 2020-11-23 LAB — MAGNESIUM: Magnesium: 1.6 mg/dL — ABNORMAL LOW (ref 1.7–2.4)

## 2020-11-23 LAB — TSH: TSH: 2 u[IU]/mL (ref 0.308–3.960)

## 2020-11-23 MED ORDER — HEPARIN SOD (PORK) LOCK FLUSH 100 UNIT/ML IV SOLN
500.0000 [IU] | Freq: Once | INTRAVENOUS | Status: AC | PRN
  Administered 2020-11-23: 500 [IU]
  Filled 2020-11-23: qty 5

## 2020-11-23 MED ORDER — HEPARIN SOD (PORK) LOCK FLUSH 100 UNIT/ML IV SOLN
500.0000 [IU] | Freq: Once | INTRAVENOUS | Status: AC
  Administered 2020-11-23: 500 [IU]
  Filled 2020-11-23: qty 5

## 2020-11-23 MED ORDER — SODIUM CHLORIDE 0.9% FLUSH
10.0000 mL | Freq: Once | INTRAVENOUS | Status: AC | PRN
  Administered 2020-11-23: 10 mL
  Filled 2020-11-23: qty 10

## 2020-11-23 MED ORDER — HYDROMORPHONE HCL 1 MG/ML IJ SOLN
2.0000 mg | INTRAMUSCULAR | Status: DC | PRN
  Administered 2020-11-23: 2 mg via INTRAVENOUS

## 2020-11-23 MED ORDER — HYDROMORPHONE HCL 1 MG/ML IJ SOLN
1.0000 mg | Freq: Once | INTRAMUSCULAR | Status: AC
  Administered 2020-11-23: 1 mg via INTRAVENOUS

## 2020-11-23 MED ORDER — SODIUM CHLORIDE 0.9 % IV SOLN
Freq: Once | INTRAVENOUS | Status: AC
  Filled 2020-11-23: qty 250

## 2020-11-23 MED ORDER — HYDROMORPHONE HCL 1 MG/ML IJ SOLN
INTRAMUSCULAR | Status: AC
  Filled 2020-11-23: qty 2

## 2020-11-23 MED ORDER — SODIUM CHLORIDE 0.9% FLUSH
10.0000 mL | Freq: Once | INTRAVENOUS | Status: AC
  Administered 2020-11-23: 10 mL
  Filled 2020-11-23: qty 10

## 2020-11-23 MED ORDER — HYDROMORPHONE HCL 1 MG/ML IJ SOLN
INTRAMUSCULAR | Status: AC
  Filled 2020-11-23: qty 1

## 2020-11-23 NOTE — Assessment & Plan Note (Signed)
The patient has clinical signs of progression including profound weakness, severe hypercalcemia as well as new onset of bowel and bladder incontinence From what I gather, she struggled to take care of herself at home Her husband is still working Both her daughters have come by and check on her but I do not believe she has consistent caregiver around-the-clock After very prolonged discussion, she is in agreement to get enrolled in residential hospice facility After adequate pain control in my office, the patient husband and daughter are able to get her back home for 1 final visit I have spoken with home-based palliative care team and they will get her lined up for residential hospice facility We did have long discussion about the risk and benefits of aggressive management of dehydration, hypercalcemia, pancytopenia, etc. and ultimately, the patient has made informed decision to pursue residential hospice care

## 2020-11-23 NOTE — Assessment & Plan Note (Signed)
Previously, we have extensive discussion about goals of care The patient has accepted that her disease is progressive She has obtained second opinion at [facility] and agreed to focus on palliative care only The patient agreed for DO NOT RESUSCITATE Today, she appears to have deteriorated significantly The patient was not able to make informed decision on her own and required prolonged discussion with her husband and 2 daughters I cannot since significant distress from family due to her significant deterioration Her family members are upset that they have not been adequately informed about her longevity and they were not expecting that she will deteriorate so quickly In their mind, they thought that she might have weeks to month and not days to a few weeks of life After very prolonged discussion, ultimately, we made decision together that she does not need to be admitted to the hospital.  She will return home and home-based palliative care will arrange for residential hospice admission Overall, I spent more than an hour and a half in the office taking care of her today I believe I have addressed questions and concerns from patient and family members to the best of my ability

## 2020-11-23 NOTE — Assessment & Plan Note (Signed)
This is due to bone marrow infiltration She does not need transfusion support I do not recommend future blood draw and transfusion support given her progressive nature of her disease and poor prognosis

## 2020-11-23 NOTE — Assessment & Plan Note (Signed)
She has severe, uncontrolled bone pain in my office Apparently, the dosage of her oral pain medicine was reduced due to increased sedation at home In my office, she received multiple doses of intravenous Dilaudid and has adequate pain control prior to leaving my office

## 2020-11-23 NOTE — Assessment & Plan Note (Signed)
She has signs of malignant hypercalcemia, likely exacerbated by poor oral intake and slight dehydration We discussed the high risk nature of malignant hypercalcemia We discussed the risk and benefits of admission to the hospital for aggressive management versus palliative care only Ultimately, she is in agreement to focus on hospice care

## 2020-11-23 NOTE — Progress Notes (Signed)
[facility] OFFICE PROGRESS NOTE  Patient Care Team: [name], MD as PCP - General (Family Medicine) [name], MD as Consulting Physician (Obstetrics and Gynecology) [name], RN as Oncology Nurse Navigator (Oncology)  ASSESSMENT & PLAN:  Malignant neoplasm of cervix [facility]) The patient has clinical signs of progression including profound weakness, severe hypercalcemia as well as new onset of bowel and bladder incontinence From what I gather, she struggled to take care of herself at home Her husband is still working Both her daughters have come by and check on her but I do not believe she has consistent caregiver around-the-clock After very prolonged discussion, she is in agreement to get enrolled in residential hospice facility After adequate pain control in my office, the patient husband and daughter are able to get her back home for 1 final visit I have spoken with home-based palliative care team and they will get her lined up for residential hospice facility We did have long discussion about the risk and benefits of aggressive management of dehydration, hypercalcemia, pancytopenia, etc. 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 She does not need transfusion support I do not recommend future blood draw and transfusion support given her progressive nature of her disease and poor prognosis  Hypercalcemia She has signs of malignant hypercalcemia, likely exacerbated by poor oral intake and slight dehydration We discussed the high risk nature of  malignant hypercalcemia We discussed the risk and benefits of admission to the hospital for aggressive management versus palliative care only Ultimately, she is in agreement to focus on hospice care  Goals of care, counseling/discussion Previously, we have extensive discussion about goals of care The patient has accepted that her disease is progressive She has obtained second opinion at [facility] and agreed to focus on palliative care only The patient agreed for DO NOT RESUSCITATE Today, she appears to have deteriorated significantly The patient was not able to make informed decision on her own and required prolonged discussion with her husband and 2 daughters I cannot since significant distress from family due to her significant deterioration Her family members are upset that they have not been adequately informed about her longevity and they were not expecting that she will deteriorate so quickly In their mind, they thought that she might have weeks to month and not days to a few weeks of life After very prolonged discussion, ultimately, we made decision together that she does not need to be admitted to the hospital.  She will return home and home-based palliative care will arrange for residential hospice admission Overall, I spent more than an hour and a half in the office taking care of her today I believe I have addressed questions and concerns from patient and family members to the best of my ability    No orders of the defined types were placed in this encounter.   All questions were answered. The patient knows to call the clinic with any problems, questions or concerns. The total time spent in the appointment was 100 minutes encounter with patients including review of chart and various tests results, discussions about plan  of care and coordination of care plan   [name], MD 11/23/2020 7:26 PM  INTERVAL HISTORY: Please see below for problem oriented charting. She returns with her  husband today At the sight of her significant clinical deterioration, her daughter Burman Nieves also came to the office It was a traumatic journey from home to the office to get her labs drawn From her recent discharge from the hospital, the patient has made informed decision to continue blood transfusion support Over the past few days, she had dramatic deterioration of her health According to her husband, she is soiling herself and needs to be changed every 45 minutes to an hour due to new onset of bladder and bowel incontinence She has significant confusion on a regular basis She is also sleeping more and missing meals At the suggestion from hospice care, the dose of her pain medicine was reduced yesterday to reduce excessive sedation When she arrived in the office, she rated her pain at 10 out of 10 When I saw her in the office, we immediately asked to support and gave her intravenous Dilaudid and she experienced rapid pain relief within 15 minutes Apparently, her husband still works Her mother is taking care of her However, the patient insists that she is still changing her diapers and bed whenever she is doing her self which is getting increasingly difficult Her husband has to remind the patient to take a pain medicine and sometimes will take more than 45 minutes to negotiate with the patient the rationale of why she needs to take pain medicine The patient felt she cannot make informed decision about her neck step.  Within 45 minutes of arrival, blood work revealed significant hypercalcemia Her other daughter, Jinny Blossom is available over the phone to consult Ultimately, the patient received 4 mg of IV Dilaudid with significant pain control and 500 cc of IV fluids prior to discharge  SUMMARY OF ONCOLOGIC HISTORY: Oncology History Overview Note  PD-L1 CPS: 2   Malignant neoplasm of cervix ([place])  03/16/2020 Initial Diagnosis   The patient had history of new onset vaginal spotting on March 16, 2020.  She immediately called her OB/GYN's office and achieved an appointment with her OB/GYN's partner, Dr. Murrell Redden, who saw and evaluated the patient on March 19, 2020.  At that time a transvaginal ultrasound scan was performed which revealed a uterus measuring 5.3 x 8.4 x 4.6 cm with an ill-defined endometrium.  There is a complex cyst on the left ovary measuring 2.6 cm with peripheral blood flow noted.  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  Immunostains were positive for CEA and CDX2, negative for p16, ER, vimentin, and PAX8.  The differential diagnosis included primary gastric type endocervical adenocarcinoma (though this typically stains positive for PAX8) or a metastasis from pancreaticobiliary or upper GI primary.   03/29/2020 Pathology Results   1. Surgical [P], duodenum - PEPTIC DUODENITIS. - NO DYSPLASIA OR MALIGNANCY. 2. Surgical [P], gastric antrum and gastric body - REACTIVE GASTROPATHY. [place] IS NEGATIVE FOR HELICOBACTER PYLORI. - NO INTESTINAL METAPLASIA, DYSPLASIA, OR MALIGNANCY. 3. Surgical [P], gastric nodule - MILD REACTIVE GASTROPATHY. - NO INTESTINAL METAPLASIA, DYSPLASIA, OR MALIGNANCY. 4. Surgical [P], stomach, lesser curve ulcers - REACTIVE GASTROPATHY WITH EROSIONS. - NO INTESTINAL METAPLASIA, DYSPLASIA, OR MALIGNANCY. 5. Surgical [P], gastric polyps - HYPERPLASTIC POLYP. - NO INTESTINAL METAPLASIA, DYSPLASIA OR MALIGNANCY. 6. Surgical [P], colon, descending, polyp - HYPERPLASTIC  POLYP. - NO DYSPLASIA OR MALIGNANCY.   04/06/2020 Procedure   EGD report  Normal esophagus. - A few gastric polyps. Suspected fundic gland polyps. - Non-bleeding gastric ulcers with pigmented material. Biopsied. - A single gastric polyp/nodule. Resected and retrieved. - Erythematous duodenopathy. Biopsied. - The examination was otherwise normal.    04/06/2020 Procedure   Colonoscopy report - Non-bleeding internal hemorrhoids. - One 4 mm polyp in the descending colon, removed with a cold snare. Resected and retrieved. - The examination was otherwise normal on direct and retroflexion views   04/19/2020 PET scan   1. Hypermetabolic uterine cervix mass compatible with known primary cervical malignancy, with hypermetabolism extending into the upper third of the vagina and extending throughout the uterine body. No frank extrauterine extension. 2. Mild to moderate left hydroureteronephrosis to the level of the left UVJ, concerning for tumor involvement of the left UVJ. 3. Hypermetabolic right common iliac nodal metastasis. 4. Hypermetabolic sclerotic right ischial bone metastasis. 5. Two indeterminate small scattered solid pulmonary nodules, below PET resolution, warranting attention on chest CT follow-up in 3 months. 6.  Aortic Atherosclerosis (ICD10-I70.0).   04/19/2020 Cancer Staging   Staging form: Cervix Uteri, AJCC Version 9 - Clinical stage from 04/19/2020: FIGO Stage IVB (cT3b, cN1a, cM1) - Signed by [name], MD on 04/19/2020   04/26/2020 Procedure   Successful placement of a right IJ approach Power Port with ultrasound and fluoroscopic guidance. The catheter is ready for use   04/30/2020 - 05/21/2020 Chemotherapy   The patient had cisplatin for chemotherapy treatment.     06/20/2020 Genetic Testing   Negative genetic testing on the CancerNext-Expanded+RNAinsight panel.  The CancerNext-Expanded gene panel offered by [facility] and includes sequencing and rearrangement analysis for the following 77 genes: AIP, ALK, APC*, ATM*, AXIN2, BAP1, BARD1, BLM, BMPR1A, BRCA1*, BRCA2*, BRIP1*, CDC73, CDH1*, CDK4, CDKN1B, CDKN2A, CHEK2*, CTNNA1, DICER1, FANCC, FH, FLCN, GALNT12, KIF1B, LZTR1, MAX, MEN1, MET, MLH1*, MSH2*, MSH3, MSH6*, MUTYH*, NBN, NF1*, NF2, NTHL1, PALB2*, PHOX2B, PMS2*, POT1, PRKAR1A, PTCH1, PTEN*, RAD51C*, RAD51D*, RB1,  RECQL, RET, SDHA, SDHAF2, SDHB, SDHC, SDHD, SMAD4, SMARCA4, SMARCB1, SMARCE1, STK11, SUFU, TMEM127, TP53*, TSC1, TSC2, VHL and XRCC2 (sequencing and deletion/duplication); EGFR, EGLN1, HOXB13, KIT, MITF, PDGFRA, POLD1, and POLE (sequencing only); EPCAM and GREM1 (deletion/duplication only). DNA and RNA analyses performed for * genes. The report date is June 20, 2020.   07/27/2020 PET scan   1. Enlarging and new bilateral pulmonary nodules are evident today. Larger nodules in the right upper lung show FDG accumulation. Imaging features compatible with metastatic disease. 2. New hypermetabolic lymph nodes identified in the retrocaval space of the upper abdomen and para-aortic space of the lower abdomen, consistent with new metastatic involvement. There is some persistent hypermetabolism associated with small lymph nodes along the right common iliac chain. 3. Interval decrease in hypermetabolism associated with the cervix. 4. Persistent, mildly progressive left hydroureteronephrosis. Left ureter is dilated down to the level it passes the cervix, just proximal to the UVJ. 5. Decreased hypermetabolism in the previously identified right ischial tuberosity lesion consistent with metastatic disease.  6. New punctate focus of hypermetabolism in the gastric fundus without underlying mass lesion evident by CT. Attention on follow-up recommended. 7. Tiny gas bubble in the bladder lumen is presumably secondary to recent instrumentation although bladder infection could have this appearance.   07/28/2020 -  Chemotherapy    Patient is on Treatment Plan: CERVICAL PEMBROLIZUMAB/CARBOPLATIN + PACLITAXEL + BEVACIZUMAB Q21D       10/04/2020 Imaging  1. There are multiple bilateral pulmonary nodules, generally subsolid in composition. The majority of nodules are diminished in size, or even completely resolved. A nodule of the dependent superior segment left lower lobe however appears to be slightly increased in size,  measuring 1.2 x 0.9 cm, previously 0.9 x 0.9 cm when measured similarly. Findings are generally consistent with treatment response of pulmonary metastatic disease, however with some evidence of mixed response. 2. No significant interval change in previously FDG avid subcentimeter retroperitoneal lymph nodes, consistent with nodal metastatic disease. 3. Redemonstrated sclerotic osseous lesion of the right ischial tuberosity, not significant changed. No new osseous lesions identified by CT. 4. Circumferential soft tissue thickening about the cervix and lower uterine segment, overall dimensions approximately 5.5 x 3.5 cm, not significantly changed, consistent with primary cervical malignancy. There is loss of fat plane to the adjacent posterior urinary bladder wall and thickening of the bladder wall, suggesting direct invasion. 5. Perirectal and presacral soft tissue stranding, slightly increased compared to prior examination and consistent with developing radiation change. 6. Worsened, severe left hydronephrosis and new, moderate right hydronephrosis secondary to obstruction by cervical mass.     10/07/2020 - 10/14/2020 Hospital Admission   She was admitted to the hospital due to severe vaginal bleeding requiring multiple units of blood transfusion.  She also had cystoscopy with right ureteral stent placement and left diagnostic ureteroscopy while in the hospital She also have significant pain requiring multiple adjustment of pain medicine   10/10/2020 Surgery   Preoperative diagnosis:  1.  Bilateral hydronephrosis secondary to extrinsic compression   Postoperative diagnosis: 1.  Bilateral hydronephrosis secondary to extrinsic compression 2.  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  Cystoscopy with right retrograde pyelogram with right ureteral stent placement 2.  Left diagnostic ureteroscopy   Surgeon: Link Snuffer, MD    Drains/Catheters: 1.  Right 6  x 24 double-J ureteral stent 2.  Foley catheter   Intraoperative findings: 1.  Normal urethra 2.  Significant trabeculation within the bladder.  Low capacity bladder that seems somewhat noncompliant questionable secondary to her history of radiation. 3.  Right retrograde pyelogram revealed severe hydroureteronephrosis.  Successful placement of right ureteral stent 4.  Difficult to locate left ureteral orifice.  It looked like the distal ureter was completely occluded questionably by tumor?     Metastasis to bone ([place])  04/19/2020 Initial Diagnosis   Metastasis to bone ([place])   07/28/2020 -  Chemotherapy    Patient is on Treatment Plan: CERVICAL PEMBROLIZUMAB/CARBOPLATIN + PACLITAXEL + BEVACIZUMAB Q21D         REVIEW OF SYSTEMS:   All other systems were reviewed with the patient and are negative.  I have reviewed the past medical history, past surgical history, social history and family history with the patient and they are unchanged from previous note.  ALLERGIES:  is allergic to penicillins.  MEDICATIONS:  Current Outpatient Medications  Medication Sig Dispense Refill  . HYDROmorphone (DILAUDID) 8 MG tablet Take 1 tablet (8 mg total) by mouth every 4 (four) hours as needed for severe pain. 90 tablet 0  . LORazepam (ATIVAN) 0.5 MG tablet Take 1 tablet (0.5 mg total) by mouth every 8 (eight) hours as needed for anxiety. 60 tablet 0  . morphine (MS CONTIN) 60 MG 12 hr tablet Take 1 tablet (60 mg total) by mouth in the morning, at noon, and at bedtime. TAKE 1 TABLET BY MOUTH 3 TIMES DAILY 90 tablet 0  .  pantoprazole (PROTONIX) 40 MG tablet Take 1 tablet (40 mg total) by mouth 2 (two) times daily. 90 tablet 3  . polyethylene glycol (MIRALAX / GLYCOLAX) 17 g packet Dissolve 17 g in water or juice and drink once daily. 14 each 0  . predniSONE (DELTASONE) 20 MG tablet Take 2 tablets (40 mg total) by mouth daily with breakfast. 60 tablet 1  . prochlorperazine (COMPAZINE) 10 MG tablet TAKE 1  TABLET BY MOUTH EVERY 6 HOURS AS NEEDED FOR NAUSEA AND/OR VOMITING (Patient taking differently: Take 10 mg by mouth every 6 (six) hours as needed.) 90 tablet 1  . senna-docusate (SENOKOT-S) 8.6-50 MG tablet Take 2 tablets by mouth 2 (two) times daily. 30 tablet 0   Current Facility-Administered Medications  Medication Dose Route Frequency Provider Last Rate Last Admin  . HYDROmorphone (DILAUDID) injection 2 mg  2 mg Intravenous Q2H PRN [name], MD   2 mg at 11/23/20 0944    PHYSICAL EXAMINATION: ECOG PERFORMANCE STATUS: 4 - Bedbound  Vitals:   11/23/20 0919  BP: (!) 145/79  Pulse: (!) 121  Resp: 18  Temp: 98.9 F (37.2 C)  SpO2: 96%   There were no vitals filed for this visit.  GENERAL she is intermittently alert, in severe distress from pain but was able to get comfortable at the end of the visit She looks pale and weak  LABORATORY DATA:  I have reviewed the data as listed    Component Value Date/Time   NA 141 11/23/2020 0846   NA 140 06/21/2016 1530   K 3.1 (L) 11/23/2020 0846   CL 100 11/23/2020 0846   CO2 28 11/23/2020 0846   GLUCOSE 147 (H) 11/23/2020 0846   BUN 19 11/23/2020 0846   BUN 16 06/21/2016 1530   CREATININE 1.01 (H) 11/23/2020 0846   CREATININE 0.87 07/02/2017 1549   CALCIUM 10.8 (H) 11/23/2020 0846   PROT 6.4 (L) 11/23/2020 0846   PROT 7.3 06/21/2016 1530   ALBUMIN 2.5 (L) 11/23/2020 0846   ALBUMIN 4.3 06/21/2016 1530   AST 7 (L) 11/23/2020 0846   ALT 11 11/23/2020 0846   ALKPHOS 69 11/23/2020 0846   BILITOT 0.5 11/23/2020 0846   GFRNONAA >60 11/23/2020 0846   GFRAA 111 06/21/2016 1530    No results found for: SPEP, UPEP  Lab Results  Component Value Date   WBC 9.6 11/23/2020   NEUTROABS 8.4 (H) 11/23/2020   HGB 8.4 (L) 11/23/2020   HCT 26.3 (L) 11/23/2020   MCV 88.9 11/23/2020   PLT 78 (L) 11/23/2020      Chemistry      Component Value Date/Time   NA 141 11/23/2020 0846   NA 140 06/21/2016 1530   K 3.1 (L) 11/23/2020 0846    CL 100 11/23/2020 0846   CO2 28 11/23/2020 0846   BUN 19 11/23/2020 0846   BUN 16 06/21/2016 1530   CREATININE 1.01 (H) 11/23/2020 0846   CREATININE 0.87 07/02/2017 1549      Component Value Date/Time   CALCIUM 10.8 (H) 11/23/2020 0846   ALKPHOS 69 11/23/2020 0846   AST 7 (L) 11/23/2020 0846   ALT 11 11/23/2020 0846   BILITOT 0.5 11/23/2020 0846

## 2020-12-01 ENCOUNTER — Telehealth: Payer: Self-pay | Admitting: Family Medicine

## 2020-12-01 ENCOUNTER — Telehealth: Payer: Self-pay

## 2020-12-01 NOTE — Telephone Encounter (Signed)
[place] called to inform us that Phyllis Hebert passed away yesterday 30-Dec-2020 at 4:59 pm

## 2020-12-01 NOTE — Telephone Encounter (Signed)
Misty, Education officer, museum at [facility] called to inform us pt expired 11/30/20 at 1659.

## 2020-12-17 DEATH — deceased

## 2021-01-20 ENCOUNTER — Ambulatory Visit: Payer: Self-pay | Admitting: Radiation Oncology

## 2022-03-04 IMAGING — MG DIGITAL SCREENING BILAT W/ TOMO W/ CAD
8 series · 8 of 24 positions shown · non-contrast
Comparison: Previous exam(s).

CLINICAL DATA: Screening.

EXAM:
DIGITAL SCREENING BILATERAL MAMMOGRAM WITH TOMO AND CAD

[R MLO synth-2D]
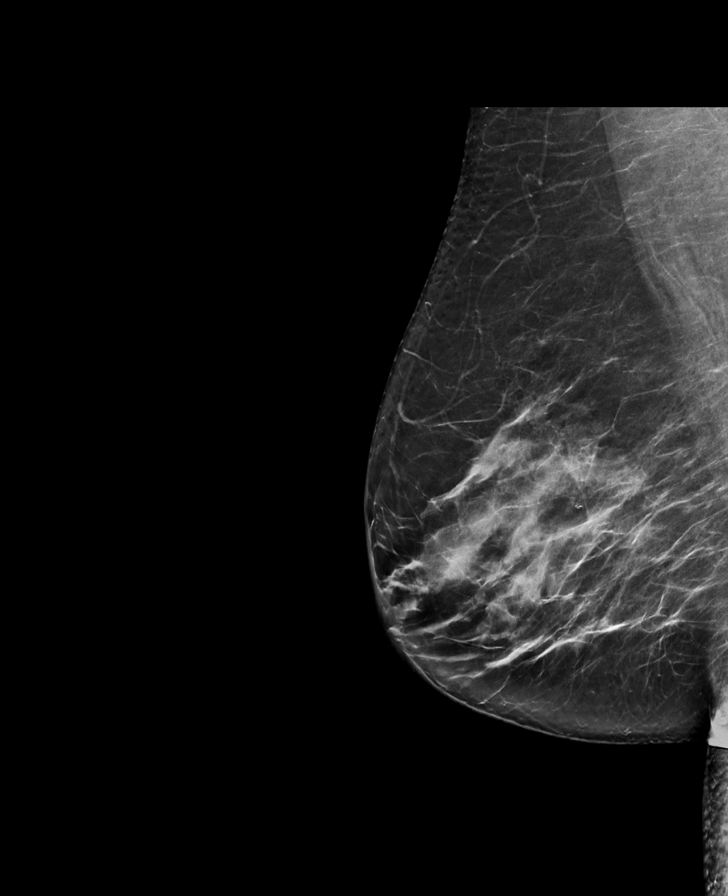

[L MLO synth-2D]
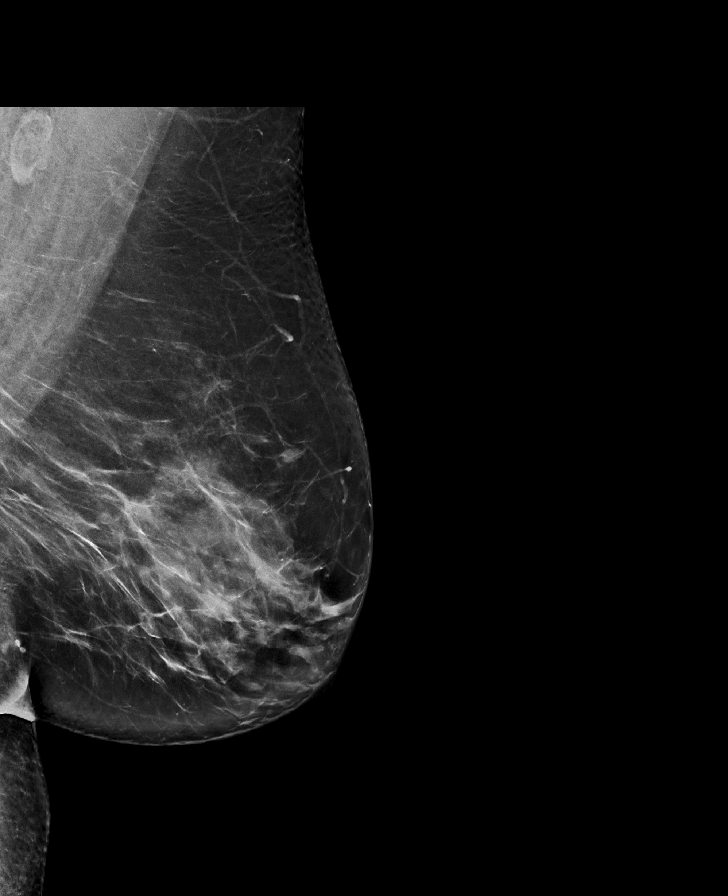

[R CC synth-2D]
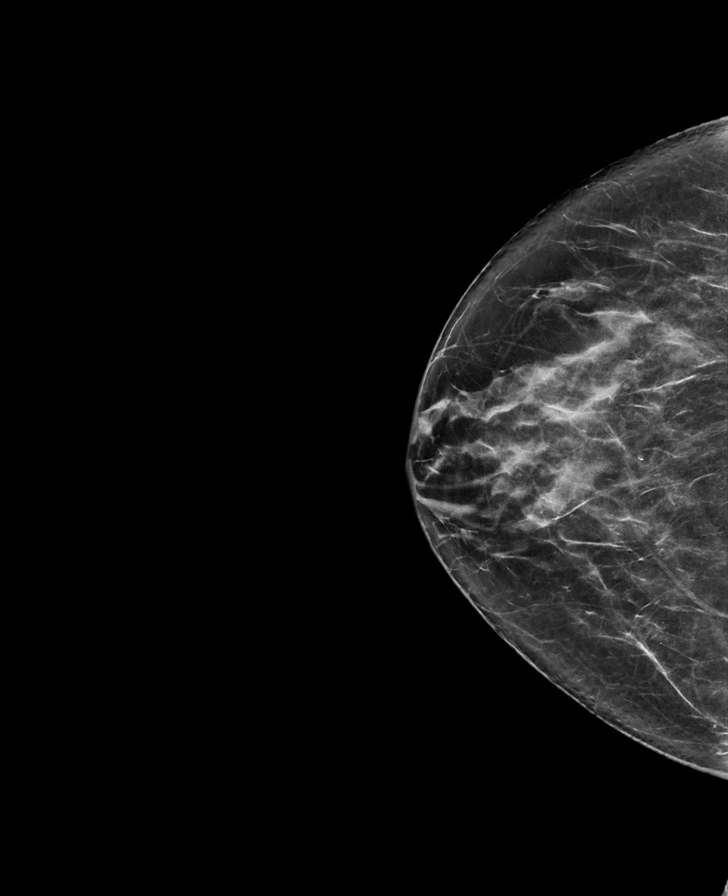

[L CC synth-2D]
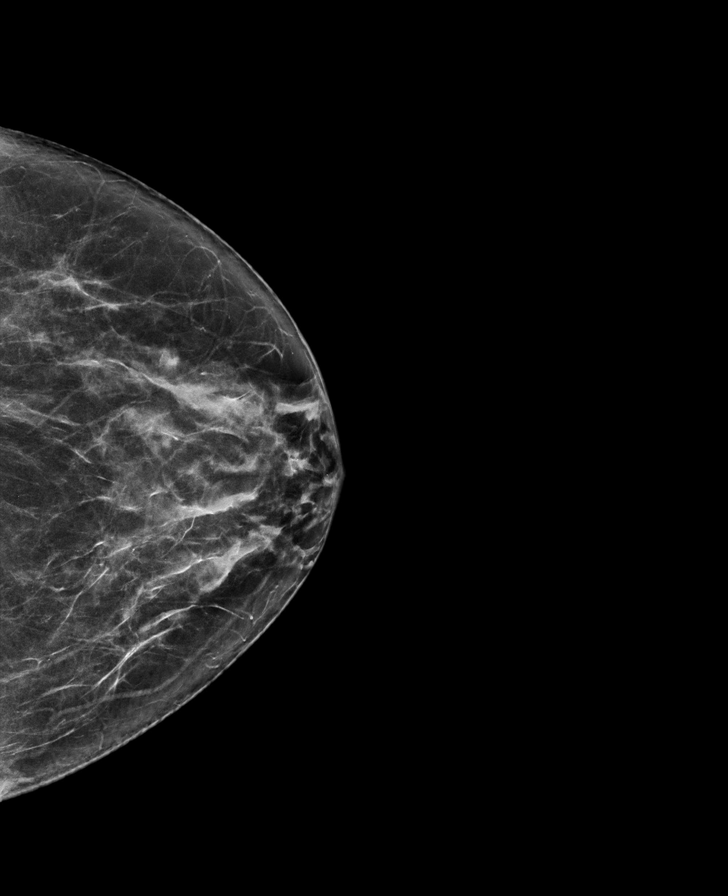

[R MLO tomo · tomo slice 42/83.0]
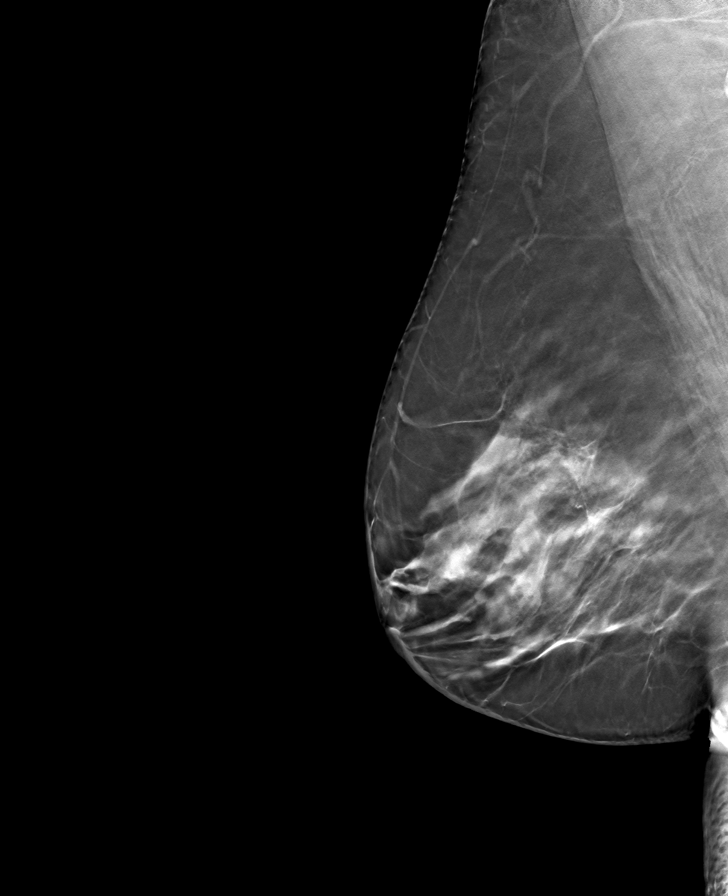

[L CC tomo · tomo slice 36/71.0]
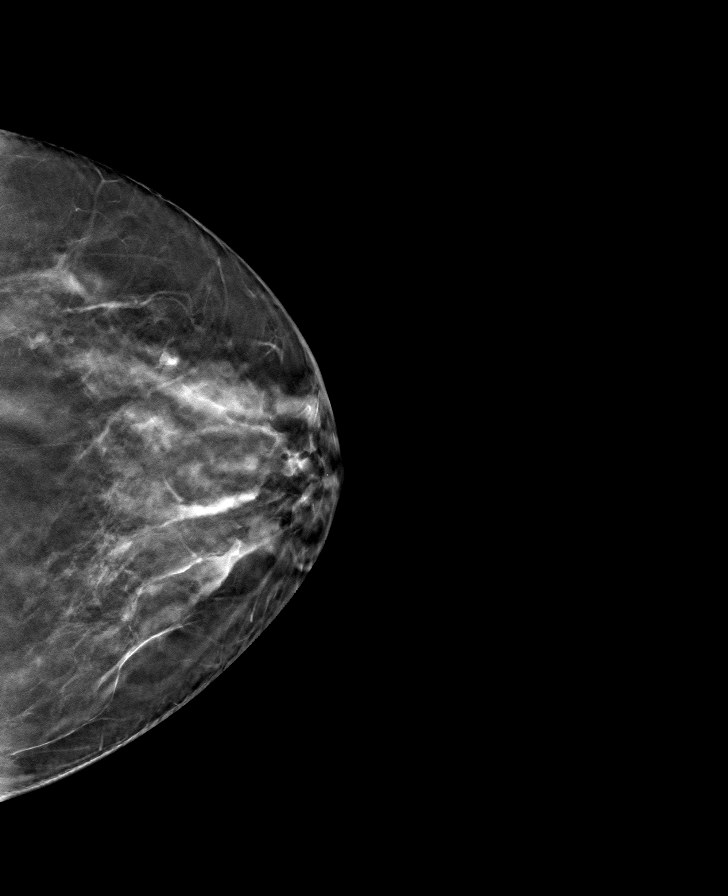

[R CC tomo · tomo slice 37/72.0]
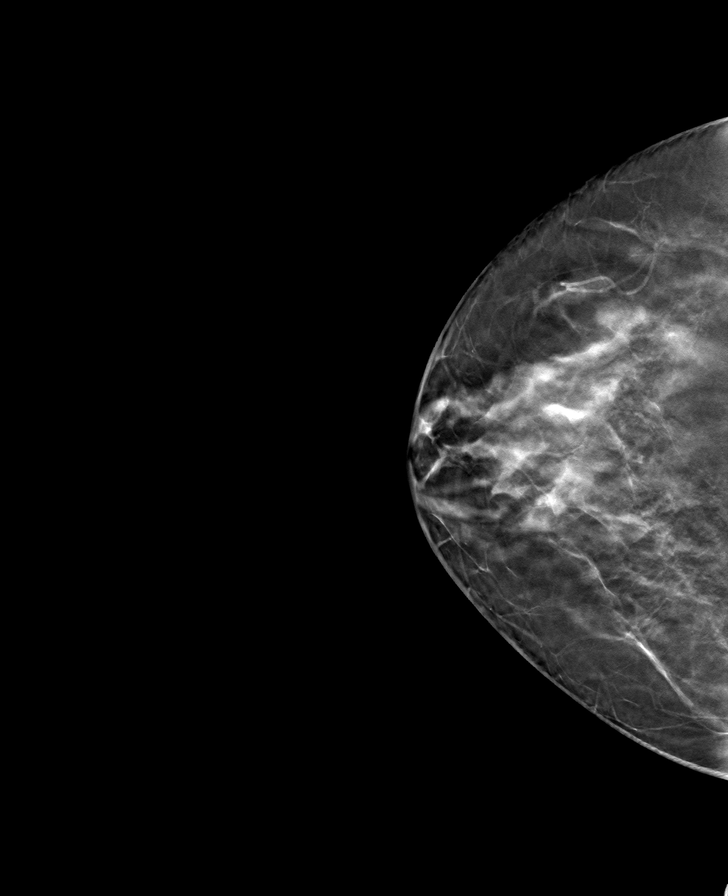

[L MLO tomo · tomo slice 45/90.0]
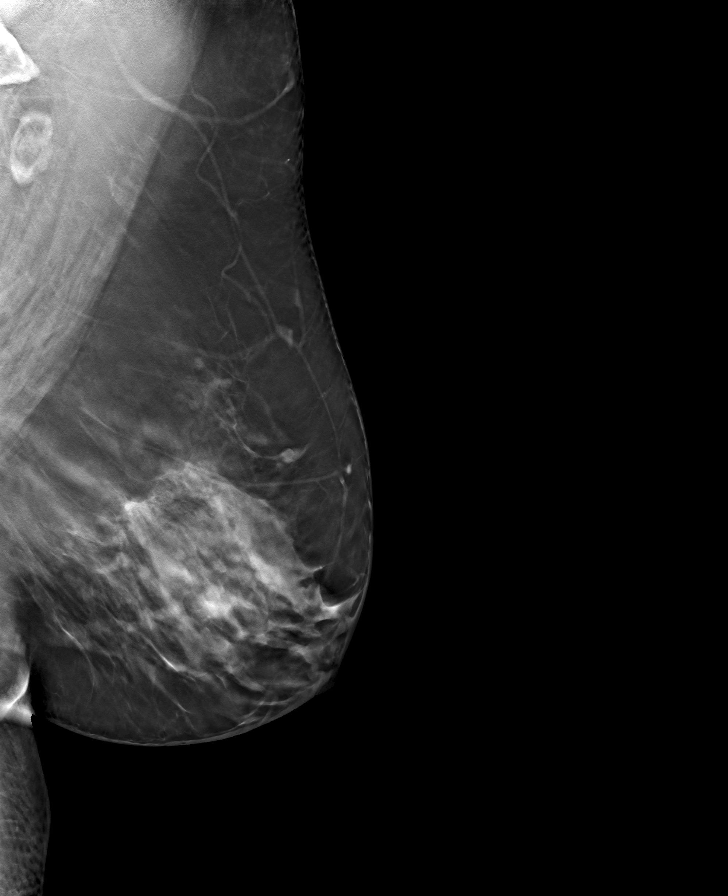

[8 of 24 positions shown; findings below may reference images not displayed]

ACR Breast Density Category c: The breast tissue is heterogeneously
dense, which may obscure small masses.
FINDINGS: There are no findings suspicious for malignancy. Images were
processed with CAD.
IMPRESSION: No mammographic evidence of malignancy. A result letter of this
screening mammogram will be mailed directly to the patient.

RECOMMENDATION:
Screening mammogram in one year. (Code:[id number])

BI-RADS CATEGORY  1: Negative.

## 2022-04-07 IMAGING — US IR IMAGING GUIDED PORT INSERTION
1 series · 1 of 1 positions shown · non-contrast
Comparison: none

INDICATION: Stage IV B cervical cancer. She presents for port catheter placement
for chemotherapy.

[Series 1: (id) · 1 of 1 slices shown]
[im 1/1]
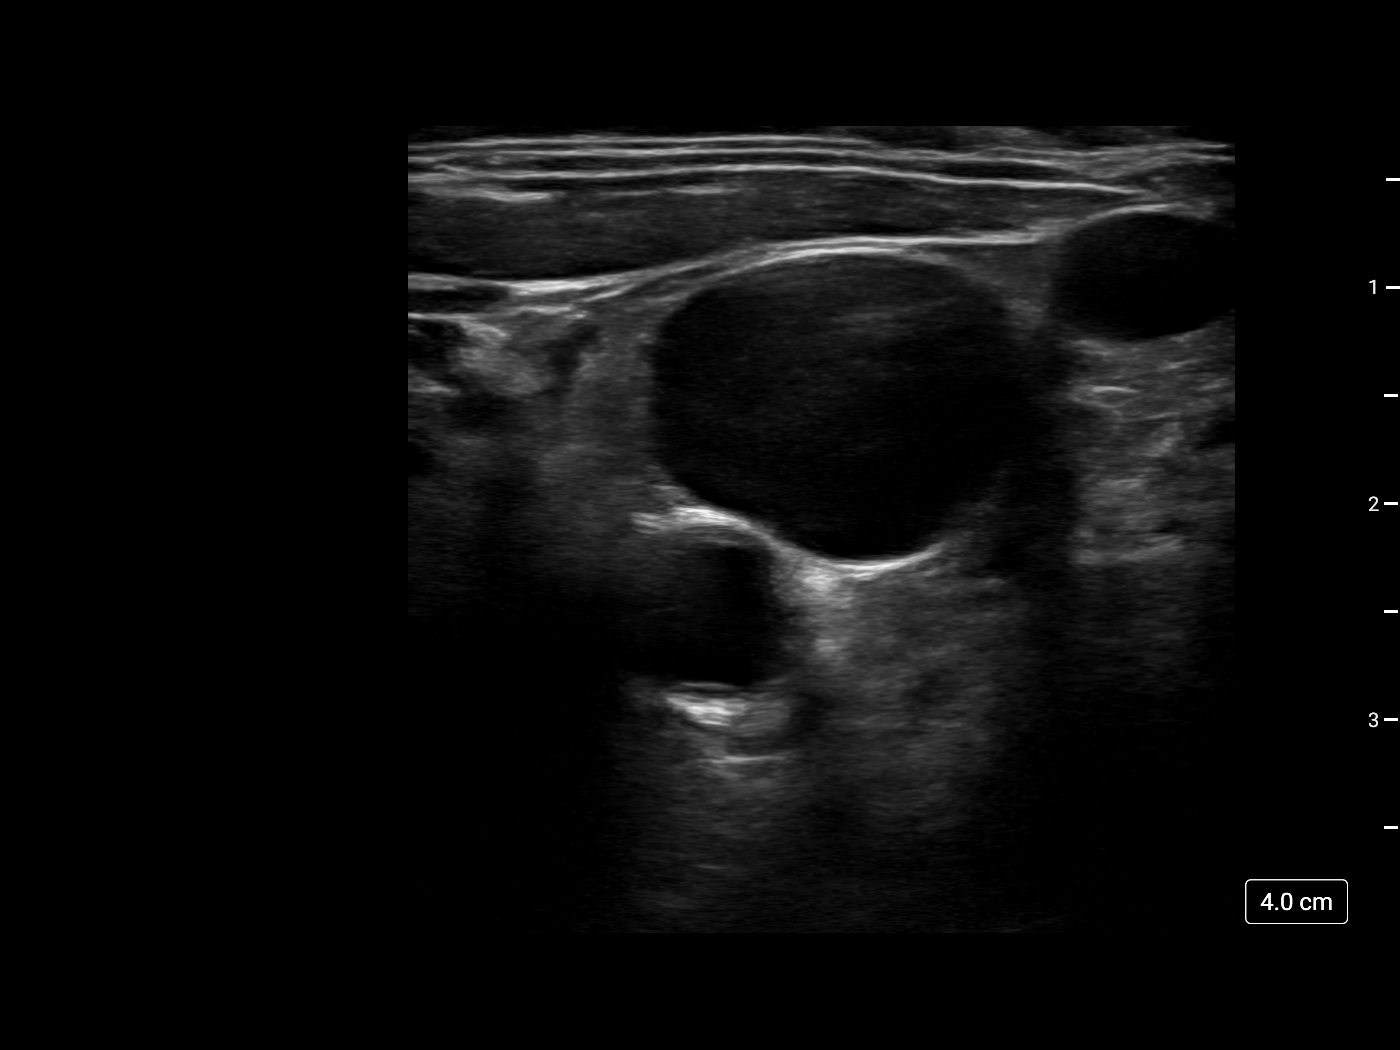

[1 of 1 positions shown; findings below may reference images not displayed]

EXAM:
IMPLANTED PORT A CATH PLACEMENT WITH ULTRASOUND AND FLUOROSCOPIC
GUIDANCE

MEDICATIONS:
900 mg clindamycin; The antibiotic was administered within an
appropriate time interval prior to skin puncture.

ANESTHESIA/SEDATION:
Versed 3 mg IV; Fentanyl 100 mcg IV;

Moderate Sedation Time:  19 minutes

The patient was continuously monitored during the procedure by the
interventional radiology nurse under my direct supervision.

FLUOROSCOPY TIME:  0 minutes, 6 seconds (2 mGy)

COMPLICATIONS:
None immediate.

PROCEDURE:
The right neck and chest was prepped with chlorhexidine, and draped
in the usual sterile fashion using maximum barrier technique (cap
and mask, sterile gown, sterile gloves, large sterile sheet, hand
hygiene and cutaneous antiseptic). Local anesthesia was attained by
infiltration with 1% lidocaine with epinephrine.

Ultrasound demonstrated patency of the right internal jugular vein,
and this was documented with an image. Under real-time ultrasound
guidance, this vein was accessed with a 21 gauge micropuncture
needle and image documentation was performed. A small dermatotomy
was made at the access site with an 11 scalpel. A 0.018" wire was
advanced into the SVC and the access needle exchanged for a 4F
micropuncture vascular sheath. The 0.018" wire was then removed and
a 0.035" wire advanced into the IVC.



The venous access site was then serially dilated and a peel away
vascular sheath placed over the wire. The wire was removed and the
port catheter advanced into position under fluoroscopic guidance.
The catheter tip is positioned in the superior cavoatrial junction.
This was documented with a spot image. The portacatheter was then
tested and found to flush and aspirate well. The port was flushed
with saline followed by 100 units/mL heparinized saline.

The pocket was then closed in two layers using first subdermal
inverted interrupted absorbable sutures followed by a running
subcuticular suture. The epidermis was then sealed with Dermabond.
The dermatotomy at the venous access site was also closed with
Dermabond.
IMPRESSION: Successful placement of a right IJ approach Power Port with
ultrasound and fluoroscopic guidance. The catheter is ready for use.

## 2023-04-02 NOTE — Telephone Encounter (Signed)
error
